# Patient Record
Sex: Female | Born: 1990 | Race: Black or African American | Hispanic: No | Smoking: Former smoker
Health system: Southern US, Community
[De-identification: ages and names within clinical notes are randomized; demographics above are authoritative.]

## PROBLEM LIST (undated history)

## (undated) ENCOUNTER — Inpatient Hospital Stay (HOSPITAL_COMMUNITY): Payer: Self-pay

## (undated) DIAGNOSIS — O24419 Gestational diabetes mellitus in pregnancy, unspecified control: Secondary | ICD-10-CM

## (undated) DIAGNOSIS — A749 Chlamydial infection, unspecified: Secondary | ICD-10-CM

## (undated) DIAGNOSIS — N12 Tubulo-interstitial nephritis, not specified as acute or chronic: Secondary | ICD-10-CM

## (undated) DIAGNOSIS — O139 Gestational [pregnancy-induced] hypertension without significant proteinuria, unspecified trimester: Secondary | ICD-10-CM

## (undated) DIAGNOSIS — K469 Unspecified abdominal hernia without obstruction or gangrene: Secondary | ICD-10-CM

## (undated) DIAGNOSIS — D649 Anemia, unspecified: Secondary | ICD-10-CM

## (undated) DIAGNOSIS — E119 Type 2 diabetes mellitus without complications: Secondary | ICD-10-CM

## (undated) DIAGNOSIS — IMO0002 Reserved for concepts with insufficient information to code with codable children: Secondary | ICD-10-CM

## (undated) DIAGNOSIS — K5090 Crohn's disease, unspecified, without complications: Secondary | ICD-10-CM

## (undated) HISTORY — PX: HERNIA REPAIR: SHX51

---
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
## 2011-03-04 ENCOUNTER — Inpatient Hospital Stay (HOSPITAL_COMMUNITY): Payer: Medicaid Other

## 2011-03-04 ENCOUNTER — Inpatient Hospital Stay (HOSPITAL_COMMUNITY)
Admission: AD | Admit: 2011-03-04 | Discharge: 2011-03-04 | Disposition: A | Discharge status: Home / Self Care | Payer: Medicaid Other | Source: Ambulatory Visit | Attending: Obstetrics & Gynecology | Admitting: Obstetrics & Gynecology

## 2011-03-04 DIAGNOSIS — O209 Hemorrhage in early pregnancy, unspecified: Secondary | ICD-10-CM | POA: Insufficient documentation

## 2011-03-04 DIAGNOSIS — O2390 Unspecified genitourinary tract infection in pregnancy, unspecified trimester: Secondary | ICD-10-CM | POA: Insufficient documentation

## 2011-03-04 DIAGNOSIS — N760 Acute vaginitis: Secondary | ICD-10-CM | POA: Insufficient documentation

## 2011-03-04 DIAGNOSIS — B9689 Other specified bacterial agents as the cause of diseases classified elsewhere: Secondary | ICD-10-CM | POA: Insufficient documentation

## 2011-03-04 DIAGNOSIS — A499 Bacterial infection, unspecified: Secondary | ICD-10-CM

## 2011-03-04 LAB — URINALYSIS, ROUTINE W REFLEX MICROSCOPIC
Glucose, UA: NEGATIVE mg/dL
Ketones, ur: NEGATIVE mg/dL
Protein, ur: NEGATIVE mg/dL
Urobilinogen, UA: 0.2 mg/dL (ref 0.0–1.0)

## 2011-03-04 LAB — WET PREP, GENITAL: Trich, Wet Prep: NONE SEEN

## 2011-03-04 LAB — CBC
HCT: 34 % — ABNORMAL LOW (ref 36.0–46.0)
MCHC: 33.5 g/dL (ref 30.0–36.0)
MCV: 85.2 fL (ref 78.0–100.0)
Platelets: 201 10*3/uL (ref 150–400)
RDW: 13.8 % (ref 11.5–15.5)

## 2011-03-04 LAB — HCG, QUANTITATIVE, PREGNANCY: hCG, Beta Chain, Quant, S: 62820 m[IU]/mL — ABNORMAL HIGH (ref <5)

## 2011-03-07 LAB — GC/CHLAMYDIA PROBE AMP, GENITAL: GC Probe Amp, Genital: POSITIVE — AB

## 2011-05-10 ENCOUNTER — Encounter (HOSPITAL_COMMUNITY): Payer: Self-pay

## 2011-05-10 ENCOUNTER — Inpatient Hospital Stay (HOSPITAL_COMMUNITY): Payer: Medicaid Other

## 2011-05-10 ENCOUNTER — Inpatient Hospital Stay (HOSPITAL_COMMUNITY)
Admission: AD | Admit: 2011-05-10 | Discharge: 2011-05-10 | Disposition: A | Discharge status: Home / Self Care | Payer: Medicaid Other | Source: Ambulatory Visit | Attending: Obstetrics | Admitting: Obstetrics

## 2011-05-10 DIAGNOSIS — A5901 Trichomonal vulvovaginitis: Secondary | ICD-10-CM

## 2011-05-10 DIAGNOSIS — R109 Unspecified abdominal pain: Secondary | ICD-10-CM | POA: Insufficient documentation

## 2011-05-10 DIAGNOSIS — N949 Unspecified condition associated with female genital organs and menstrual cycle: Secondary | ICD-10-CM | POA: Insufficient documentation

## 2011-05-10 DIAGNOSIS — N938 Other specified abnormal uterine and vaginal bleeding: Secondary | ICD-10-CM | POA: Insufficient documentation

## 2011-05-10 HISTORY — DX: Unspecified abdominal hernia without obstruction or gangrene: K46.9

## 2011-05-10 LAB — ABO/RH: ABO/RH(D): O POS

## 2011-05-10 LAB — URINALYSIS, ROUTINE W REFLEX MICROSCOPIC
Leukocytes, UA: NEGATIVE
Protein, ur: NEGATIVE mg/dL
Specific Gravity, Urine: 1.015 (ref 1.005–1.030)
Urobilinogen, UA: 1 mg/dL (ref 0.0–1.0)

## 2011-05-10 MED ORDER — METRONIDAZOLE 500 MG PO TABS: ORAL_TABLET | ORAL | Status: DC

## 2011-05-10 NOTE — Progress Notes (Signed)
Pt states abd cramping and spotting began 3 days ago, last intercourse 1.5 weeks ago. Denies pain at present, no abnormal vaginal d/c changes. 1st appt w/Dr. Gaynell Face 05/21/2011, did call MD office, told to come here for eval.

## 2011-05-10 NOTE — ED Provider Notes (Signed)
History     Chief Complaint  Patient presents with  . Vaginal Bleeding  . Abdominal Cramping   HPI  Past Medical History  Diagnosis Date  . Hernia     Past Surgical History  Procedure Date  . Hernia repair     Family History  Problem Relation Age of Onset  . Hypertension Maternal Grandmother     History  Substance Use Topics  . Smoking status: Current Everyday Smoker  . Smokeless tobacco: Not on file  . Alcohol Use: No    OB History    Grav Para Term Preterm Abortions TAB SAB Ect Mult Living   1             3 day hx light vag spotting. No antecedent intercourse or irritative vaginitis. No dysuria or hematuria. Cramps as mild like menstrual cramps in suprapubic region.  Has MD NOB 05/22/11.   Review of Systems  Genitourinary: Negative for frequency, flank pain, vaginal discharge, vaginal pain and dyspareunia.    Physical Exam  BP 110/57  Pulse 70  Temp(Src) 97.5 F (36.4 C) (Oral)  Resp 18  Ht 5\' 7"  (1.702 m)  Wt 60.839 kg (134 lb 2 oz)  BMI 21.01 kg/m2  Physical Exam  Constitutional: She appears well-developed and well-nourished. No distress.  Abdominal: Soft. There is no tenderness.       Fundal height 17 cm at umbilicus  Genitourinary: Vagina normal.       NEFG, Spec: no vag/cx lesions, scant brown blood at os. Cx: closed, post, sl short, PP high    ED Course  Procedures [place]  Placenta nl w/ no evidence abruption or SCH.  Cx 3.7 - pt reassured. Warnings for bleeding in pregnancy reviewed. To come here or office if abd pain or BRB occurs.  WP pos for trich - will tx with Flagyl  MDM Bleeding in early pregnancy possibly due to trich cervicitis.

## 2011-05-22 LAB — RUBELLA ANTIBODY, IGM: Rubella: IMMUNE

## 2011-05-22 LAB — HIV ANTIBODY (ROUTINE TESTING W REFLEX): HIV: NONREACTIVE

## 2011-06-05 DIAGNOSIS — N949 Unspecified condition associated with female genital organs and menstrual cycle: Secondary | ICD-10-CM

## 2011-06-20 ENCOUNTER — Inpatient Hospital Stay (HOSPITAL_COMMUNITY)
Admission: AD | Admit: 2011-06-20 | Discharge: 2011-06-21 | Disposition: A | Discharge status: Home / Self Care | Payer: Medicaid Other | Source: Ambulatory Visit | Attending: Obstetrics | Admitting: Obstetrics

## 2011-06-20 DIAGNOSIS — O99891 Other specified diseases and conditions complicating pregnancy: Secondary | ICD-10-CM | POA: Insufficient documentation

## 2011-06-20 DIAGNOSIS — R109 Unspecified abdominal pain: Secondary | ICD-10-CM | POA: Insufficient documentation

## 2011-06-20 DIAGNOSIS — N949 Unspecified condition associated with female genital organs and menstrual cycle: Secondary | ICD-10-CM

## 2011-06-20 DIAGNOSIS — R51 Headache: Secondary | ICD-10-CM | POA: Insufficient documentation

## 2011-06-21 ENCOUNTER — Encounter (HOSPITAL_COMMUNITY): Payer: Self-pay | Admitting: *Deleted

## 2011-06-21 LAB — URINE MICROSCOPIC-ADD ON

## 2011-06-21 LAB — URINALYSIS, ROUTINE W REFLEX MICROSCOPIC
Glucose, UA: NEGATIVE mg/dL
Specific Gravity, Urine: 1.015 (ref 1.005–1.030)
pH: 6.5 (ref 5.0–8.0)

## 2011-06-21 NOTE — Progress Notes (Signed)
Pt states she is having pain since about 2200.

## 2011-06-21 NOTE — ED Provider Notes (Signed)
History   Pt presents today c/o lower abd cramping that "comes and goes." She also c/o a HA but states that has resolved since coming to the MAU. She denies vag dc, bleeding, fever, or any other sx at this time.  Chief Complaint  Patient presents with  . Abdominal Cramping   HPI  OB History    Grav Para Term Preterm Abortions TAB SAB Ect Mult Living   1               Past Medical History  Diagnosis Date  . Hernia   . No pertinent past medical history     Past Surgical History  Procedure Date  . Hernia repair     Family History  Problem Relation Age of Onset  . Hypertension Maternal Grandmother     History  Substance Use Topics  . Smoking status: Current Everyday Smoker  . Smokeless tobacco: Former Neurosurgeon    Quit date: 06/10/2011  . Alcohol Use: No    Allergies: No Known Allergies  Prescriptions prior to admission  Medication Sig Dispense Refill  . prenatal vitamin w/FE, FA (PRENATAL 1 + 1) 27-1 MG TABS Take 1 tablet by mouth daily.        . metroNIDAZOLE (FLAGYL) 500 MG tablet Take 4 tabs all at once  4 tablet  0    Review of Systems  Constitutional: Negative for fever.  Cardiovascular: Negative for chest pain.  Gastrointestinal: Positive for abdominal pain. Negative for nausea, vomiting, diarrhea and constipation.  Genitourinary: Negative for dysuria, urgency, frequency and hematuria.  Neurological: Positive for headaches. Negative for dizziness.  Psychiatric/Behavioral: Negative for depression and suicidal ideas.   Physical Exam   Temperature 97.8 F (36.6 C), temperature source Oral.  Physical Exam  Constitutional: She is oriented to person, place, and time. She appears well-developed and well-nourished. No distress.  HENT:  Head: Normocephalic and atraumatic.  Eyes: EOM are normal. Pupils are equal, round, and reactive to light.  GI: Soft. She exhibits no distension. There is no tenderness. There is no rebound and no guarding.  Genitourinary: No  bleeding around the vagina. No vaginal discharge found.       Cervix Lg/closed. No adnexal masses. Pt is non-tender on exam.  Neurological: She is alert and oriented to person, place, and time.  Skin: Skin is warm and dry. She is not diaphoretic.  Psychiatric: She has a normal mood and affect. Her behavior is normal. Judgment and thought content normal.    MAU Course  Procedures  Results for orders placed during the hospital encounter of 06/20/11 (from the past 24 hour(s))  URINALYSIS, ROUTINE W REFLEX MICROSCOPIC     Status: Abnormal   Collection Time   06/21/11 12:05 AM      Component Value Range   Color, Urine YELLOW  YELLOW    Appearance CLEAR  CLEAR    Specific Gravity, Urine 1.015  1.005 - 1.030    pH 6.5  5.0 - 8.0    Glucose, UA NEGATIVE  NEGATIVE (mg/dL)   Hgb urine dipstick NEGATIVE  NEGATIVE    Bilirubin Urine NEGATIVE  NEGATIVE    Ketones, ur NEGATIVE  NEGATIVE (mg/dL)   Protein, ur NEGATIVE  NEGATIVE (mg/dL)   Urobilinogen, UA 1.0  0.0 - 1.0 (mg/dL)   Nitrite NEGATIVE  NEGATIVE    Leukocytes, UA TRACE (*) NEGATIVE   URINE MICROSCOPIC-ADD ON     Status: Abnormal   Collection Time   06/21/11 12:05 AM  Component Value Range   Squamous Epithelial / LPF FEW (*) RARE    WBC, UA 7-10  <3 (WBC/hpf)   RBC / HPF 3-6  <3 (RBC/hpf)   Bacteria, UA MANY (*) RARE      Assessment and Plan  Round Ligament Pain: discussed with pt at length. She has f/u scheduled. Discussed diet, activity, risks, and precautions.  Clinton Gallant. Alessandro Griep III, DrHSc, MPAS, PA-C  06/21/2011, 12:40 AM

## 2011-06-29 ENCOUNTER — Inpatient Hospital Stay (HOSPITAL_COMMUNITY)
Admission: AD | Admit: 2011-06-29 | Discharge: 2011-06-29 | Discharge status: Against Medical Advice | Payer: Medicaid Other | Source: Ambulatory Visit | Attending: Obstetrics | Admitting: Obstetrics

## 2011-06-29 NOTE — Progress Notes (Signed)
Pt states, " I've had breast soreness,tender and sharp pain in my nipples for two weeks."

## 2011-06-29 NOTE — ED Notes (Signed)
Called, not in lobby 

## 2011-06-29 NOTE — ED Notes (Signed)
Called not in lobby.

## 2011-08-10 ENCOUNTER — Encounter (HOSPITAL_COMMUNITY): Payer: Self-pay | Admitting: *Deleted

## 2011-09-24 LAB — CULTURE, BETA STREP (GROUP B ONLY): Organism ID, Bacteria: NEGATIVE

## 2011-10-25 ENCOUNTER — Inpatient Hospital Stay (HOSPITAL_COMMUNITY): Payer: Medicaid Other

## 2011-10-25 ENCOUNTER — Encounter (HOSPITAL_COMMUNITY): Payer: Self-pay | Admitting: Anesthesiology

## 2011-10-25 ENCOUNTER — Other Ambulatory Visit: Payer: Self-pay | Admitting: Obstetrics

## 2011-10-25 ENCOUNTER — Inpatient Hospital Stay (HOSPITAL_COMMUNITY)
Admission: AD | Admit: 2011-10-25 | Discharge: 2011-10-29 | DRG: 775 | Disposition: A | Discharge status: Home / Self Care | Payer: Medicaid Other | Source: Ambulatory Visit | Attending: Obstetrics | Admitting: Obstetrics

## 2011-10-25 ENCOUNTER — Encounter (HOSPITAL_COMMUNITY): Payer: Self-pay | Admitting: *Deleted

## 2011-10-25 ENCOUNTER — Inpatient Hospital Stay (HOSPITAL_COMMUNITY): Payer: Medicaid Other | Admitting: Anesthesiology

## 2011-10-25 DIAGNOSIS — O99892 Other specified diseases and conditions complicating childbirth: Secondary | ICD-10-CM | POA: Diagnosis present

## 2011-10-25 DIAGNOSIS — O364XX0 Maternal care for intrauterine death, not applicable or unspecified: Principal | ICD-10-CM | POA: Diagnosis present

## 2011-10-25 DIAGNOSIS — E1010 Type 1 diabetes mellitus with ketoacidosis without coma: Secondary | ICD-10-CM

## 2011-10-25 DIAGNOSIS — O24919 Unspecified diabetes mellitus in pregnancy, unspecified trimester: Secondary | ICD-10-CM

## 2011-10-25 DIAGNOSIS — Z22330 Carrier of Group B streptococcus: Secondary | ICD-10-CM

## 2011-10-25 DIAGNOSIS — R7309 Other abnormal glucose: Secondary | ICD-10-CM | POA: Diagnosis present

## 2011-10-25 LAB — CBC
HCT: 40.2 % (ref 36.0–46.0)
Hemoglobin: 13.8 g/dL (ref 12.0–15.0)
MCH: 29 pg (ref 26.0–34.0)
MCHC: 34.3 g/dL (ref 30.0–36.0)

## 2011-10-25 LAB — URINALYSIS, DIPSTICK ONLY
Bilirubin Urine: NEGATIVE
Glucose, UA: >1000 mg/dL — AB
Hgb urine dipstick: NEGATIVE
Specific Gravity, Urine: <1.005 — ABNORMAL LOW (ref 1.005–1.030)

## 2011-10-25 LAB — COMPREHENSIVE METABOLIC PANEL
ALT: 15 U/L (ref 0–35)
AST: 15 U/L (ref 0–37)
CO2: 17 mEq/L — ABNORMAL LOW (ref 19–32)
Chloride: 90 mEq/L — ABNORMAL LOW (ref 96–112)
Creatinine, Ser: 0.93 mg/dL (ref 0.50–1.10)
GFR calc Af Amer: >90 mL/min (ref >90)
GFR calc non Af Amer: 89 mL/min — ABNORMAL LOW (ref >90)
Glucose, Bld: 876 mg/dL (ref 70–99)
Total Bilirubin: 0.3 mg/dL (ref 0.3–1.2)

## 2011-10-25 LAB — BLOOD GAS, ARTERIAL
Drawn by: 29165
TCO2: 16.4 mmol/L (ref 0–100)
pCO2 arterial: 26.9 mmHg — ABNORMAL LOW (ref 35.0–45.0)
pH, Arterial: 7.381 (ref 7.350–7.400)

## 2011-10-25 LAB — BASIC METABOLIC PANEL
CO2: 20 mEq/L (ref 19–32)
GFR calc non Af Amer: 71 mL/min — ABNORMAL LOW (ref >90)
Glucose, Bld: 664 mg/dL (ref 70–99)
Potassium: 4.2 mEq/L (ref 3.5–5.1)
Sodium: 126 mEq/L — ABNORMAL LOW (ref 135–145)

## 2011-10-25 LAB — RPR: RPR: NONREACTIVE

## 2011-10-25 LAB — GLUCOSE, CAPILLARY
Glucose-Capillary: >600 mg/dL (ref 70–99)
Glucose-Capillary: >600 mg/dL (ref 70–99)
Glucose-Capillary: >600 mg/dL (ref 70–99)
Glucose-Capillary: 373 mg/dL — ABNORMAL HIGH (ref 70–99)
Glucose-Capillary: 246 mg/dL — ABNORMAL HIGH (ref 70–99)

## 2011-10-25 MED ORDER — DEXTROSE 50 % IV SOLN: 25.0000 mL | INTRAVENOUS | Status: DC | PRN

## 2011-10-25 MED ORDER — SODIUM CHLORIDE 0.9 % IV SOLN
Freq: Once | INTRAVENOUS | Status: DC
  Filled 2011-10-25: qty 1000

## 2011-10-25 MED ORDER — FLEET ENEMA 7-19 GM/118ML RE ENEM: 1.0000 | ENEMA | RECTAL | Status: DC | PRN

## 2011-10-25 MED ORDER — INSULIN REGULAR BOLUS VIA INFUSION: 0.0000 [IU] | Freq: Three times a day (TID) | INTRAVENOUS | Status: DC | PRN

## 2011-10-25 MED ORDER — PHENYLEPHRINE 40 MCG/ML (10ML) SYRINGE FOR IV PUSH (FOR BLOOD PRESSURE SUPPORT): 80.0000 ug | PREFILLED_SYRINGE | INTRAVENOUS | Status: DC | PRN

## 2011-10-25 MED ORDER — FENTANYL 2.5 MCG/ML BUPIVACAINE 1/10 % EPIDURAL INFUSION (WH - ANES)
14.0000 mL/h | INTRAMUSCULAR | Status: DC
  Administered 2011-10-25 (×2): 14 mL/h via EPIDURAL
  Filled 2011-10-25 (×2): qty 60

## 2011-10-25 MED ORDER — FERROUS SULFATE 325 (65 FE) MG PO TABS
325.0000 mg | ORAL_TABLET | Freq: Two times a day (BID) | ORAL | Status: DC
  Administered 2011-10-26 – 2011-10-29 (×7): 325 mg via ORAL
  Filled 2011-10-25 (×7): qty 1

## 2011-10-25 MED ORDER — WITCH HAZEL-GLYCERIN EX PADS
1.0000 "application " | MEDICATED_PAD | CUTANEOUS | Status: DC | PRN
  Administered 2011-10-29: 1 via TOPICAL

## 2011-10-25 MED ORDER — OXYTOCIN BOLUS FROM INFUSION
500.0000 mL | Freq: Once | INTRAVENOUS | Status: DC
  Filled 2011-10-25: qty 500

## 2011-10-25 MED ORDER — PHENYLEPHRINE 40 MCG/ML (10ML) SYRINGE FOR IV PUSH (FOR BLOOD PRESSURE SUPPORT)
80.0000 ug | PREFILLED_SYRINGE | INTRAVENOUS | Status: DC | PRN
  Filled 2011-10-25: qty 5

## 2011-10-25 MED ORDER — LACTATED RINGERS IV SOLN
INTRAVENOUS | Status: DC
  Administered 2011-10-25 (×2): via INTRAVENOUS

## 2011-10-25 MED ORDER — OXYTOCIN 20 UNITS IN LACTATED RINGERS INFUSION - SIMPLE
125.0000 mL/h | Freq: Once | INTRAVENOUS | Status: AC
  Administered 2011-10-25: 500 mL/h via INTRAVENOUS

## 2011-10-25 MED ORDER — IBUPROFEN 600 MG PO TABS: 600.0000 mg | ORAL_TABLET | Freq: Four times a day (QID) | ORAL | Status: DC | PRN

## 2011-10-25 MED ORDER — EPHEDRINE 5 MG/ML INJ: 10.0000 mg | INTRAVENOUS | Status: DC | PRN

## 2011-10-25 MED ORDER — OXYCODONE-ACETAMINOPHEN 5-325 MG PO TABS: 2.0000 | ORAL_TABLET | ORAL | Status: DC | PRN

## 2011-10-25 MED ORDER — SODIUM CHLORIDE 0.45 % IV SOLN
INTRAVENOUS | Status: DC
  Administered 2011-10-25: 18:00:00 via INTRAVENOUS

## 2011-10-25 MED ORDER — OXYCODONE-ACETAMINOPHEN 5-325 MG PO TABS: 1.0000 | ORAL_TABLET | ORAL | Status: DC | PRN

## 2011-10-25 MED ORDER — OXYTOCIN 20 UNITS IN LACTATED RINGERS INFUSION - SIMPLE
1.0000 m[IU]/min | INTRAVENOUS | Status: DC
  Administered 2011-10-25: 1 m[IU]/min via INTRAVENOUS
  Filled 2011-10-25: qty 1000

## 2011-10-25 MED ORDER — DIBUCAINE 1 % RE OINT
1.0000 "application " | TOPICAL_OINTMENT | RECTAL | Status: DC | PRN
  Administered 2011-10-29: 1 via RECTAL
  Filled 2011-10-25: qty 28

## 2011-10-25 MED ORDER — DIPHENHYDRAMINE HCL 50 MG/ML IJ SOLN: 12.5000 mg | INTRAMUSCULAR | Status: DC | PRN

## 2011-10-25 MED ORDER — DEXTROSE-NACL 5-0.45 % IV SOLN: INTRAVENOUS | Status: DC

## 2011-10-25 MED ORDER — DEXTROSE-NACL 5-0.45 % IV SOLN
INTRAVENOUS | Status: DC
  Administered 2011-10-25 – 2011-10-26 (×2): via INTRAVENOUS

## 2011-10-25 MED ORDER — ONDANSETRON HCL 4 MG/2ML IJ SOLN: 4.0000 mg | Freq: Four times a day (QID) | INTRAMUSCULAR | Status: DC | PRN

## 2011-10-25 MED ORDER — SODIUM CHLORIDE 0.9 % IV SOLN: INTRAVENOUS | Status: DC

## 2011-10-25 MED ORDER — ZOLPIDEM TARTRATE 5 MG PO TABS: 5.0000 mg | ORAL_TABLET | Freq: Every evening | ORAL | Status: DC | PRN

## 2011-10-25 MED ORDER — SENNOSIDES-DOCUSATE SODIUM 8.6-50 MG PO TABS
2.0000 | ORAL_TABLET | Freq: Every day | ORAL | Status: DC
  Administered 2011-10-26 – 2011-10-28 (×3): 2 via ORAL

## 2011-10-25 MED ORDER — ONDANSETRON HCL 4 MG PO TABS: 4.0000 mg | ORAL_TABLET | ORAL | Status: DC | PRN

## 2011-10-25 MED ORDER — POTASSIUM CHLORIDE 10 MEQ/100ML IV SOLN: 10.0000 meq | Freq: Once | INTRAVENOUS | Status: DC

## 2011-10-25 MED ORDER — ACETAMINOPHEN 325 MG PO TABS: 650.0000 mg | ORAL_TABLET | ORAL | Status: DC | PRN

## 2011-10-25 MED ORDER — LACTATED RINGERS IV SOLN: 500.0000 mL | INTRAVENOUS | Status: DC | PRN

## 2011-10-25 MED ORDER — SIMETHICONE 80 MG PO CHEW: 80.0000 mg | CHEWABLE_TABLET | ORAL | Status: DC | PRN

## 2011-10-25 MED ORDER — BENZOCAINE-MENTHOL 20-0.5 % EX AERO
1.0000 "application " | INHALATION_SPRAY | CUTANEOUS | Status: DC | PRN
  Administered 2011-10-27 – 2011-10-29 (×2): 1 via TOPICAL

## 2011-10-25 MED ORDER — PROMETHAZINE HCL 25 MG/ML IJ SOLN
12.5000 mg | INTRAMUSCULAR | Status: DC
Start: 2011-10-25 — End: 2011-10-25
  Administered 2011-10-25: 12.5 mg via INTRAVENOUS
  Filled 2011-10-25: qty 1

## 2011-10-25 MED ORDER — SODIUM CHLORIDE 0.9 % IV SOLN
INTRAVENOUS | Status: DC
  Administered 2011-10-25: 5.4 [IU]/h via INTRAVENOUS
  Filled 2011-10-25: qty 1

## 2011-10-25 MED ORDER — BUTORPHANOL TARTRATE 2 MG/ML IJ SOLN
1.0000 mg | INTRAMUSCULAR | Status: DC
  Administered 2011-10-25: 1 mg via INTRAVENOUS
  Filled 2011-10-25: qty 1

## 2011-10-25 MED ORDER — ONDANSETRON HCL 4 MG/2ML IJ SOLN: 4.0000 mg | INTRAMUSCULAR | Status: DC | PRN

## 2011-10-25 MED ORDER — DIPHENHYDRAMINE HCL 25 MG PO CAPS: 25.0000 mg | ORAL_CAPSULE | Freq: Four times a day (QID) | ORAL | Status: DC | PRN

## 2011-10-25 MED ORDER — TERBUTALINE SULFATE 1 MG/ML IJ SOLN: 0.2500 mg | Freq: Once | INTRAMUSCULAR | Status: DC | PRN

## 2011-10-25 MED ORDER — SODIUM CHLORIDE 0.9 % IV SOLN
INTRAVENOUS | Status: DC
  Administered 2011-10-25: 21:00:00 via INTRAVENOUS
  Filled 2011-10-25 (×2): qty 1000

## 2011-10-25 MED ORDER — IBUPROFEN 600 MG PO TABS
600.0000 mg | ORAL_TABLET | Freq: Four times a day (QID) | ORAL | Status: DC
  Administered 2011-10-26 – 2011-10-29 (×9): 600 mg via ORAL
  Filled 2011-10-25 (×10): qty 1

## 2011-10-25 MED ORDER — LIDOCAINE HCL (PF) 1 % IJ SOLN
30.0000 mL | INTRAMUSCULAR | Status: DC | PRN
  Filled 2011-10-25: qty 30

## 2011-10-25 MED ORDER — OXYTOCIN 10 UNIT/ML IJ SOLN
20.0000 [IU] | Freq: Once | INTRAVENOUS | Status: DC
  Filled 2011-10-25: qty 2

## 2011-10-25 MED ORDER — INSULIN REGULAR BOLUS VIA INFUSION: 0.0000 [IU] | Freq: Three times a day (TID) | INTRAVENOUS | Status: DC

## 2011-10-25 MED ORDER — INSULIN ASPART 100 UNIT/ML ~~LOC~~ SOLN: 0.0000 [IU] | SUBCUTANEOUS | Status: DC

## 2011-10-25 MED ORDER — LACTATED RINGERS IV SOLN: 500.0000 mL | Freq: Once | INTRAVENOUS | Status: DC

## 2011-10-25 MED ORDER — LIDOCAINE HCL 1.5 % IJ SOLN
INTRAMUSCULAR | Status: DC | PRN
  Administered 2011-10-25 (×2): 5 mL via EPIDURAL

## 2011-10-25 MED ORDER — INSULIN REGULAR HUMAN 100 UNIT/ML IJ SOLN: INTRAMUSCULAR | Status: DC | PRN

## 2011-10-25 MED ORDER — EPHEDRINE 5 MG/ML INJ
10.0000 mg | INTRAVENOUS | Status: DC | PRN
  Filled 2011-10-25: qty 4

## 2011-10-25 MED ORDER — TETANUS-DIPHTH-ACELL PERTUSSIS 5-2.5-18.5 LF-MCG/0.5 IM SUSP
0.5000 mL | Freq: Once | INTRAMUSCULAR | Status: DC
  Filled 2011-10-25: qty 0.5

## 2011-10-25 MED ORDER — CITRIC ACID-SODIUM CITRATE 334-500 MG/5ML PO SOLN: 30.0000 mL | ORAL | Status: DC | PRN

## 2011-10-25 MED ORDER — PRENATAL MULTIVITAMIN CH
1.0000 | ORAL_TABLET | Freq: Every day | ORAL | Status: DC
  Administered 2011-10-27 – 2011-10-29 (×3): 1 via ORAL
  Filled 2011-10-25 (×3): qty 1

## 2011-10-25 MED ORDER — SODIUM CHLORIDE 0.9 % IV SOLN
INTRAVENOUS | Status: DC
  Filled 2011-10-25 (×2): qty 1

## 2011-10-25 MED ORDER — LANOLIN HYDROUS EX OINT: TOPICAL_OINTMENT | CUTANEOUS | Status: DC | PRN

## 2011-10-25 MED ORDER — SODIUM CHLORIDE 0.9 % IV SOLN
INTRAVENOUS | Status: DC
  Administered 2011-10-25: 20:00:00 via INTRAVENOUS

## 2011-10-25 NOTE — Anesthesia Procedure Notes (Signed)
Epidural Patient location during procedure: OB Start time: 10/25/2011 3:05 PM  Staffing Anesthesiologist: Brayton Caves R Performed by: anesthesiologist   Preanesthetic Checklist Completed: patient identified, site marked, surgical consent, pre-op evaluation, timeout performed, IV checked, risks and benefits discussed and monitors and equipment checked  Epidural Patient position: sitting Prep: site prepped and draped and DuraPrep Patient monitoring: continuous pulse ox and blood pressure Approach: midline Injection technique: LOR air and LOR saline  Needle:  Needle type: Tuohy  Needle gauge: 17 G Needle length: 9 cm Needle insertion depth: 5 cm cm Catheter type: closed end flexible Catheter size: 19 Gauge Catheter at skin depth: 10 cm Test dose: negative  Assessment Events: blood not aspirated, injection not painful, no injection resistance, negative IV test and no paresthesia  Additional Notes Patient identified.  Risk benefits discussed including failed block, incomplete pain control, headache, nerve damage, paralysis, blood pressure changes, nausea, vomiting, reactions to medication both toxic or allergic, and postpartum back pain.  Patient expressed understanding and wished to proceed.  All questions were answered.  Sterile technique used throughout procedure and epidural site dressed with sterile barrier dressing. No paresthesia or other complications noted.The patient did not experience any signs of intravascular injection such as tinnitus or metallic taste in mouth nor signs of intrathecal spread such as rapid motor block. Please see nursing notes for vital signs.

## 2011-10-25 NOTE — ED Provider Notes (Signed)
History     Chief Complaint  Patient presents with  . Contractions   HPI  Pt here with report of contractions that started this morning.  Denies vaginal bleeding or leaking of fluid.  Reports fetal movement earlier this morning.    Past Medical History  Diagnosis Date  . Hernia   . No pertinent past medical history     Past Surgical History  Procedure Date  . Hernia repair     Family History  Problem Relation Age of Onset  . Hypertension Maternal Grandmother     History  Substance Use Topics  . Smoking status: Current Everyday Smoker -- 0.2 packs/day  . Smokeless tobacco: Former Neurosurgeon    Quit date: 06/10/2011  . Alcohol Use: No    Allergies: No Known Allergies  Prescriptions prior to admission  Medication Sig Dispense Refill  . Prenatal Vit-Fe Fumarate-FA (PRENATAL MULTIVITAMIN) TABS Take 1 tablet by mouth daily.          Review of Systems  Gastrointestinal: Positive for abdominal pain ( contractions).  All other systems reviewed and are negative.   Physical Exam   Blood pressure 125/76, pulse 82, temperature 96.9 F (36.1 C), temperature source Oral, resp. rate 18, height 5\' 4"  (1.626 m), weight 74.753 kg (164 lb 12.8 oz), SpO2 98.00%.  Physical Exam  Constitutional: She is oriented to person, place, and time. She appears well-developed and well-nourished.  HENT:  Head: Normocephalic.  Neck: Normal range of motion. Neck supple.  Cardiovascular: Normal rate, regular rhythm and normal heart sounds.   Respiratory: Effort normal and breath sounds normal.  Genitourinary: No bleeding around the vagina. Vaginal discharge (mucusy) found.       Cervix - 2/75/0  Neurological: She is alert and oriented to person, place, and time.  Skin: Skin is warm and dry.  FHR - none  MAU Course  Procedures  Bedside US - no fetal heartrate  Assessment and Plan  Term Intrauterine Fetal Demise  Plan: Admit to [employer] Labor  orders   [facility] 10/25/2011, 11:43 AM

## 2011-10-25 NOTE — Anesthesia Preprocedure Evaluation (Addendum)
Anesthesia Evaluation  Patient identified by MRN, date of birth, ID band Patient awake    Reviewed: Allergy & Precautions, H&P , Patient's Chart, lab work & pertinent test results  Airway Mallampati: II TM Distance: >3 FB Neck ROM: full    Dental No notable dental hx.    Pulmonary neg pulmonary ROS,  clear to auscultation  Pulmonary exam normal       Cardiovascular neg cardio ROS regular Normal    Neuro/Psych Negative Neurological ROS  Negative Psych ROS   GI/Hepatic negative GI ROS, Neg liver ROS,   Endo/Other  Negative Endocrine ROSDiabetes mellitus-, Poorly Controlled  Renal/GU negative Renal ROS     Musculoskeletal   Abdominal   Peds  Hematology negative hematology ROS (+)   Anesthesia Other Findings IUFD  Reproductive/Obstetrics (+) Pregnancy                          Anesthesia Physical Anesthesia Plan  ASA: IV  Anesthesia Plan: Epidural   Post-op Pain Management:    Induction:   Airway Management Planned:   Additional Equipment:   Intra-op Plan:   Post-operative Plan:   Informed Consent: I have reviewed the patients History and Physical, chart, labs and discussed the procedure including the risks, benefits and alternatives for the proposed anesthesia with the patient or authorized representative who has indicated his/her understanding and acceptance.     Plan Discussed with:   Anesthesia Plan Comments:       question glucose results >800.  Would recommend checking urine ketones and glucose.  ASA status changed based on blood glucose level. Anesthesia Quick Evaluation

## 2011-10-25 NOTE — Progress Notes (Signed)
Patient to MAU with complaints of contractions every 2-4 minutes. Denies any bleeding or leaking and did report fetal movement this morning. Doppler in triage unable to pick up fetal heart bones and patient taken directly to room 5 in MAU.

## 2011-10-25 NOTE — Plan of Care (Signed)
Unable to hear fetal heart rate in triage. Patient taken directly to room 5 in MAU and called for stat bedside ultrasound. Bernita Buffy, CNM in with small unit ultrasound and no fetal heart beat seen.

## 2011-10-25 NOTE — Progress Notes (Signed)
Patient ID: Sandra Foster, female   DOB: Aug 03, 1991, 20 y.o.   MRN: [id number] Patient had a diabetic screen on 912 at 25 weeks which is 162 she had a three-hour diabetic screen on 918 which was totally normal fasting blood sugar 85 1  hour 145 two-hour 118 three-hour 124 this was done at 26 weeks she had 8 office visits and her urines were all negative for glucose this p.m. Her glucose was 800 and a repeat was 811 she was started on the Glucomander and this p.m. Critical care was consulted in talking with the patient she felt fine until about 2 weeks ago then she had another polyuria and polydipsia and felt lethargic prior to that time she felt fine at this point she is now fully dilated +1 station and after delivery with the transferred to the ICU for telemetry and to be seen by critical care

## 2011-10-25 NOTE — H&P (Signed)
This is Dr. Francoise Ceo dictating the history and physical on  Sandra Foster she's a 20 year old gravida 1 EDC 1229 12th negative GBS oh positive no allergies patient states that she's had good movement of the baby and last  movement was when she came to the hospital contracting this morning in triage shows no fetal heart an ultrasound revealed there was no fetal heart on examination the cervix is 2 cm 85% with the vertex at a 0 station amniotomy was performed the fluid was golden color Past medical history negative Past surgical history negative Social history negative System review negative Physical exam Well-developed female in in early labor Breasts negative Lungs clear Heart regular rhythm no murmurs no gallops Abdomen term size with no for the heart Pelvic as described above Extremities negative admitting diagnoses IUFD at 39 weeks and 5 days

## 2011-10-25 NOTE — Progress Notes (Signed)
1600 Lab called with a critical value of blood glucose of 876.  Results reported to dr Gaynell Face at [phone].  Dr Gaynell Face ordered a repeat blood glucose.

## 2011-10-25 NOTE — Consult Note (Signed)
Name: Sandra Foster MRN: [id number] DOB: [birth year]    LOS: 0  PCCM CONSULTATION NOTE  History of Present Illness: 20 yo AAF who presented to [facility] in labor and triage showed no fetal heart rate.  She was found to be hyperglycemic with BG exceeding 800.  Retrospectively she reports polyuria, polydipsia and lethargy over last two weeks.  Three hour diabetic screen at 26 weeks was normal.  Lines / Drains: 12/27  Foley  Cultures: None  Antibiotics: None  Tests / Events: 12/27  Presented in labor at term pregnancy with no fetal heart tones >>> fetal demise.  Hyperglycemia and acidotic.   Past Medical History  Diagnosis Date  . Hernia   . No pertinent past medical history    Past Surgical History  Procedure Date  . Hernia repair    Prior to Admission medications   Not on File   Allergies No Known Allergies  Family History Family History  Problem Relation Age of Onset  . Hypertension Maternal Grandmother    Social History  reports that she has been smoking.  She quit smokeless tobacco use about 4 months ago. She reports that she does not drink alcohol or use illicit drugs.  Review Of Systems  11 points review of systems is negative with an exception of listed in HPI.  Vital Signs: Temp:  [96.9 F (36.1 C)-98.7 F (37.1 C)] 98.7 F (37.1 C) (12/27 2201) Pulse Rate:  [72-127] 96  (12/27 2216) Resp:  [18-20] 18  (12/27 2201) BP: (97-145)/(46-89) 110/58 mmHg (12/27 2216) SpO2:  [98 %] 98 % (12/27 1049) Weight:  [74.753 kg (164 lb 12.8 oz)] 164 lb 12.8 oz (74.753 kg) (12/27 1049)   Physical Examination: General:  Anxious, no acute distress Neuro:  Awake, alert, oriented   HEENT:  PERRL Neck:  Supple, no JVD   Cardiovascular:  RRR, no M/R/G Lungs:  Clear to auscultation bilaterally Abdomen:  Soft, nontender Musculoskeletal:  No edema Skin:  Intact  Ventilator settings:   Labs and Imaging:  Reviewed.  Please refer to the Assessment and Plan section for relevant  results.  Assessment and Plan:  Status post vaginal delivery.  Fetal demise. -->  Per OB  New onset diabetes, likely precipitated by pregnancy.  Diabetic ketoacidosis.  Lab 10/25/11 2214 10/25/11 2109 10/25/11 2005 10/25/11 1901 10/25/11 1743  GLUCAP 373* 496* >600* >600* >600*   -->  IVF NS, change to D5 1/2 NS at BG 250 -->  BMP q4h, call eLink with results -->  Urine ketones -->  DKA protocol  Best practices / Disposition -->  ICU status under OB -->  PCCM consulting -->  full code -->  SCDs for DVT Px -->  GI Px is not indicated -->  NPO  Orlean Bradford, M.D. Pulmonary and Critical Care Medicine [facility] Cell: [phone] Pager: [phone]  10/25/2011, 10:26 PM

## 2011-10-25 NOTE — Progress Notes (Signed)
1700 Dr Gaynell Face notified by Lujean Rave, rn of critical value of blood glucose of 811.  Orders received for glucose stabilizer.

## 2011-10-26 ENCOUNTER — Encounter (HOSPITAL_COMMUNITY): Payer: Self-pay | Admitting: *Deleted

## 2011-10-26 ENCOUNTER — Other Ambulatory Visit: Payer: Self-pay | Admitting: Internal Medicine

## 2011-10-26 LAB — BASIC METABOLIC PANEL
BUN: 9 mg/dL (ref 6–23)
BUN: 7 mg/dL (ref 6–23)
BUN: 6 mg/dL (ref 6–23)
BUN: 6 mg/dL (ref 6–23)
BUN: 6 mg/dL (ref 6–23)
BUN: 7 mg/dL (ref 6–23)
CO2: 18 mEq/L — ABNORMAL LOW (ref 19–32)
CO2: 18 mEq/L — ABNORMAL LOW (ref 19–32)
CO2: 19 mEq/L (ref 19–32)
CO2: 18 mEq/L — ABNORMAL LOW (ref 19–32)
Calcium: 9.5 mg/dL (ref 8.4–10.5)
Calcium: 9.2 mg/dL (ref 8.4–10.5)
Calcium: 8.5 mg/dL (ref 8.4–10.5)
Chloride: 102 mEq/L (ref 96–112)
Chloride: 107 mEq/L (ref 96–112)
Creatinine, Ser: 0.8 mg/dL (ref 0.50–1.10)
Creatinine, Ser: 0.84 mg/dL (ref 0.50–1.10)
Creatinine, Ser: 0.81 mg/dL (ref 0.50–1.10)
Creatinine, Ser: 0.81 mg/dL (ref 0.50–1.10)
Creatinine, Ser: 0.79 mg/dL (ref 0.50–1.10)
GFR calc Af Amer: >90 mL/min (ref >90)
GFR calc non Af Amer: >90 mL/min (ref >90)
GFR calc non Af Amer: >90 mL/min (ref >90)
GFR calc non Af Amer: >90 mL/min (ref >90)
Glucose, Bld: 252 mg/dL — ABNORMAL HIGH (ref 70–99)
Glucose, Bld: 136 mg/dL — ABNORMAL HIGH (ref 70–99)
Glucose, Bld: 185 mg/dL — ABNORMAL HIGH (ref 70–99)
Glucose, Bld: 202 mg/dL — ABNORMAL HIGH (ref 70–99)
Glucose, Bld: 348 mg/dL — ABNORMAL HIGH (ref 70–99)
Potassium: 3.4 mEq/L — ABNORMAL LOW (ref 3.5–5.1)
Sodium: 133 mEq/L — ABNORMAL LOW (ref 135–145)

## 2011-10-26 LAB — GLUCOSE, CAPILLARY
Glucose-Capillary: 137 mg/dL — ABNORMAL HIGH (ref 70–99)
Glucose-Capillary: 149 mg/dL — ABNORMAL HIGH (ref 70–99)
Glucose-Capillary: 171 mg/dL — ABNORMAL HIGH (ref 70–99)
Glucose-Capillary: 197 mg/dL — ABNORMAL HIGH (ref 70–99)
Glucose-Capillary: 131 mg/dL — ABNORMAL HIGH (ref 70–99)

## 2011-10-26 LAB — CBC
HCT: 31.2 % — ABNORMAL LOW (ref 36.0–46.0)
MCHC: 34.9 g/dL (ref 30.0–36.0)
RDW: 14.5 % (ref 11.5–15.5)

## 2011-10-26 LAB — HEMOGLOBIN A1C: Hgb A1c MFr Bld: 8.5 % — ABNORMAL HIGH (ref <5.7)

## 2011-10-26 LAB — KETONES, URINE: Ketones, ur: NEGATIVE mg/dL

## 2011-10-26 MED ORDER — INSULIN ASPART 100 UNIT/ML ~~LOC~~ SOLN
0.0000 [IU] | Freq: Three times a day (TID) | SUBCUTANEOUS | Status: DC
  Administered 2011-10-27: 3 [IU] via SUBCUTANEOUS
  Administered 2011-10-27: 8 [IU] via SUBCUTANEOUS
  Filled 2011-10-26: qty 3

## 2011-10-26 MED ORDER — POTASSIUM CHLORIDE 10 MEQ/100ML IV SOLN
10.0000 meq | INTRAVENOUS | Status: AC
  Administered 2011-10-26 (×6): 10 meq via INTRAVENOUS
  Filled 2011-10-26 (×6): qty 100

## 2011-10-26 MED ORDER — INSULIN ASPART 100 UNIT/ML ~~LOC~~ SOLN
0.0000 [IU] | SUBCUTANEOUS | Status: DC
  Administered 2011-10-26: 2 [IU] via SUBCUTANEOUS
  Administered 2011-10-26 (×2): 3 [IU] via SUBCUTANEOUS
  Filled 2011-10-26: qty 3

## 2011-10-26 MED ORDER — SODIUM CHLORIDE 0.45 % IV SOLN
INTRAVENOUS | Status: DC
  Administered 2011-10-26 – 2011-10-27 (×2): via INTRAVENOUS

## 2011-10-26 MED ORDER — SODIUM CHLORIDE 0.9 % IV SOLN
INTRAVENOUS | Status: DC
  Administered 2011-10-26: 7.3 [IU]/h via INTRAVENOUS
  Filled 2011-10-26: qty 1

## 2011-10-26 MED ORDER — LACTATED RINGERS IV SOLN: INTRAVENOUS | Status: DC

## 2011-10-26 NOTE — Progress Notes (Signed)
Glucometer failing to connect to server.  1600 CBG = 131

## 2011-10-26 NOTE — Progress Notes (Signed)
eLink Physician-Brief Progress Note Patient Name: Sandra Foster DOB: [birth year] MRN: [id number]  Date of Service  10/26/2011   HPI/Events of Note  hypokalemia   eICU Interventions  Potassium replaced   Intervention Category Intermediate Interventions: Electrolyte abnormality - evaluation and management  DETERDING,ELIZABETH 10/26/2011, 1:24 AM

## 2011-10-26 NOTE — Progress Notes (Signed)
Dr. Chestine Spore in to see pt. And orders received for diet, d/c foley, change IVF to 0.45%NS and CBG's to ac, hs and 0230

## 2011-10-26 NOTE — Progress Notes (Signed)
Dr. Chestine Spore called at 22:00 with pt's CBG result of 423.  Orders given to start insulin gtt along with glucostabilizer.  Pt instructed not to eat or drink anything except water per MD request.

## 2011-10-26 NOTE — Progress Notes (Signed)
Patient ID: Sandra Foster, female   DOB: [birth year], 20 y.o.   MRN: [id number] Postpartum day one Vital signs normal Fundus firm Lochia moderate Legs negative This a.m. Her potassium 3.5 and she is now on her fifth run of potassium her glucose was 145 at 6 AM today Patient feels fine and she will be seen by the endocrinologist. Dr Fraser Din clarkt today

## 2011-10-26 NOTE — Anesthesia Postprocedure Evaluation (Signed)
  Anesthesia Post-op Note  Patient: Sandra Foster  Procedure(s) Performed: * No procedures listed *  Patient Location: Mother/Baby  Anesthesia Type: Epidural  Level of Consciousness: awake, alert  and oriented  Airway and Oxygen Therapy: Patient Spontanous Breathing  Post-op Pain: none  Post-op Assessment: Post-op Vital signs reviewed, Patient's Cardiovascular Status Stable, No headache, No backache, No residual numbness and No residual motor weakness  Post-op Vital Signs: Reviewed and stable  Complications: No apparent anesthesia complications

## 2011-10-26 NOTE — Progress Notes (Signed)
eLink Physician-Brief Progress Note Patient Name: Sandra Foster DOB: Mar 21, 1991 MRN: [id number]  Date of Service  10/26/2011   HPI/Events of Note   Hyperglycemia has improved with 3 consecutive blood sugars less than 150.  eICU Interventions  Patient to remain NPO until rounds will place on q4 hour SSI.  If blood sugar continues to climb will change to NS infusion   Intervention Category Intermediate Interventions: Hyperglycemia - evaluation and treatment  Alphonsus Doyel 10/26/2011, 6:16 AM

## 2011-10-26 NOTE — Consult Note (Signed)
Sandra Foster is an 20 y.o woman admitted with a three week history of increasing polyuria, polydipsia at term in her unremarkable gestation.  She was noted to have had a "normal" screening 3 hour GTT ant 26 weeks,but presents to the ER with a blood glucose of 817 mg%.  In retrospect she relates that she'd experienced increasing thirst  For three weeks and often  Consumed volumes of sugar water to quench her thirst. Over the past several days she became increasingly lethargic, weak and was  brought to the hospital.  Although her personal history is negative for diabetes she has a strong family history.             BP  110/72  PR 88  RR 20   T 97.8 WD, WN  No acute distress. Skin   Warm and dry. No vitiligo or rash HEENT:  No pallor, sclera- anicteric  No stare, lid lag or proptosis Neck:  No thyroidomegaly Chest:  No splinting,  Tenderness and lungs are clear Cor:  Precordium is adynamic. No gallops or rubs. Abd: No distention or tenderness Extremities: No clubbing or edema.         Lab CO-2 17 Glucose    817  Impression:  Stillbirth secondary to  hyperosmolar, hyperglycemic coma  and associated  metabolic derangements. Patient is without clear cut evidence of  Type I diabetes.  Will check C peptide levels in AM.  Continuous  Insulin discontinued but we'll continue fractional schedule of lispro  insulinwith meals.Activity and diet advanced.

## 2011-10-27 ENCOUNTER — Other Ambulatory Visit: Payer: Self-pay | Admitting: Internal Medicine

## 2011-10-27 LAB — BASIC METABOLIC PANEL
BUN: 9 mg/dL (ref 6–23)
BUN: 8 mg/dL (ref 6–23)
Calcium: 9.1 mg/dL (ref 8.4–10.5)
Calcium: 9 mg/dL (ref 8.4–10.5)
Chloride: 111 mEq/L (ref 96–112)
Creatinine, Ser: 0.73 mg/dL (ref 0.50–1.10)
GFR calc Af Amer: >90 mL/min (ref >90)
GFR calc non Af Amer: >90 mL/min (ref >90)
GFR calc non Af Amer: >90 mL/min (ref >90)
Glucose, Bld: 156 mg/dL — ABNORMAL HIGH (ref 70–99)

## 2011-10-27 LAB — GLUCOSE, CAPILLARY
Glucose-Capillary: 105 mg/dL — ABNORMAL HIGH (ref 70–99)
Glucose-Capillary: 98 mg/dL (ref 70–99)
Glucose-Capillary: 100 mg/dL — ABNORMAL HIGH (ref 70–99)
Glucose-Capillary: 118 mg/dL — ABNORMAL HIGH (ref 70–99)
Glucose-Capillary: 138 mg/dL — ABNORMAL HIGH (ref 70–99)
Glucose-Capillary: 258 mg/dL — ABNORMAL HIGH (ref 70–99)
Glucose-Capillary: 127 mg/dL — ABNORMAL HIGH (ref 70–99)

## 2011-10-27 LAB — HEMOGLOBIN A1C
Hgb A1c MFr Bld: 8.2 % — ABNORMAL HIGH (ref <5.7)
Mean Plasma Glucose: 189 mg/dL — ABNORMAL HIGH (ref <117)

## 2011-10-27 MED ORDER — INSULIN GLARGINE 100 UNIT/ML ~~LOC~~ SOLN
12.0000 [IU] | Freq: Two times a day (BID) | SUBCUTANEOUS | Status: DC
  Administered 2011-10-27: 12 [IU] via SUBCUTANEOUS
  Filled 2011-10-27: qty 3

## 2011-10-27 MED ORDER — BENZOCAINE-MENTHOL 20-0.5 % EX AERO
INHALATION_SPRAY | CUTANEOUS | Status: AC
  Filled 2011-10-27: qty 56

## 2011-10-27 MED ORDER — SODIUM CHLORIDE 0.9 % IJ SOLN
3.0000 mL | Freq: Two times a day (BID) | INTRAMUSCULAR | Status: DC
  Administered 2011-10-27 – 2011-10-28 (×4): 3 mL via INTRAVENOUS

## 2011-10-27 NOTE — Progress Notes (Signed)
Patient ID: Sandra Foster, female   DOB: [birth year], 20 y.o.   MRN: [id number] Postpartum day 2 Vital signs normal Fundus firm Lochia moderate Legs negative Patient being worked up by Dr. Floyce Stakes she is on sliding-scale insulin but then was changed to [employer] last night because her blood sugar had gone up about 400 workup in progress

## 2011-10-27 NOTE — Progress Notes (Signed)
Patient seen today and has been doing well. No complaints acknowledged.Blood sugars spiked to a high 418 after supper and glucose stabilizer restarted.  Maintenance insulin dose about 1 unit per hour and she was started on 12 units of  lantus  Insulin daily along with coverage.  BP 102/72  PR 74  RR  20  T97.8  Exam unchanged.  Plan:  discontinue IV and convert to saline lock           Advance activity           Nutritional management and diabetic teaching           HB-A1C,  FRUCTOSAMINE   levels

## 2011-10-27 NOTE — Progress Notes (Signed)
Noted consult for diet education. Given instability of serum glucose levels, it is not currently an optimal time for education to occur. Will follow clinical course and complete consult for diet education prior to discharge.  Please page if to be discharged over the weekend  Barbette Reichmann RD Pager [phone] (text pager)

## 2011-10-27 NOTE — Progress Notes (Signed)
10/27/11 1155 Reviewed pt hx, orders, & CBG's w/Dr Petra Kuba - MD ordered use of lunch time meal coverage for 2hr PP CBG of 370. 1214 CBG repeated prior to giving insulin  d/t being >65min since last CBG  - CBG now 258

## 2011-10-28 LAB — CBC
Hemoglobin: 11.1 g/dL — ABNORMAL LOW (ref 12.0–15.0)
MCH: 29.1 pg (ref 26.0–34.0)
MCHC: 34.3 g/dL (ref 30.0–36.0)
MCV: 85 fL (ref 78.0–100.0)
RBC: 3.81 MIL/uL — ABNORMAL LOW (ref 3.87–5.11)

## 2011-10-28 LAB — DIFFERENTIAL
Basophils Relative: 0 % (ref 0–1)
Eosinophils Absolute: 0.1 10*3/uL (ref 0.0–0.7)
Eosinophils Relative: 1 % (ref 0–5)
Lymphs Abs: 2.7 10*3/uL (ref 0.7–4.0)
Monocytes Absolute: 0.9 10*3/uL (ref 0.1–1.0)
Monocytes Relative: 9 % (ref 3–12)
Neutrophils Relative %: 64 % (ref 43–77)

## 2011-10-28 LAB — BASIC METABOLIC PANEL
BUN: 8 mg/dL (ref 6–23)
CO2: 20 mEq/L (ref 19–32)
GFR calc non Af Amer: >90 mL/min (ref >90)
Glucose, Bld: 102 mg/dL — ABNORMAL HIGH (ref 70–99)
Potassium: 3.3 mEq/L — ABNORMAL LOW (ref 3.5–5.1)

## 2011-10-28 LAB — GLUCOSE, CAPILLARY
Glucose-Capillary: 85 mg/dL (ref 70–99)
Glucose-Capillary: 104 mg/dL — ABNORMAL HIGH (ref 70–99)

## 2011-10-28 NOTE — Plan of Care (Signed)
Problem: Discharge Progression Outcomes Goal: Complications resolved/controlled Outcome: Completed/Met Date Met:  10/28/11 Patient has been taken off of Glucommander and has been down graded to [facility] and HS accu checks.Her recent CBG's have not gone under 100 or above 200 and she is not on any long term hyperglycemic  medications

## 2011-10-28 NOTE — Progress Notes (Signed)
Patient ID: Sandra Foster, female   DOB: [birth year], 20 y.o.   MRN: [id number] #3 Vital signs normal Fundus firm Lochia moderate Legs negative Workup in progress in her glucose was 145 last night an 84 this a.m. She'll be transferred from the ICU this a.m.

## 2011-10-28 NOTE — Plan of Care (Signed)
Problem: Consults Goal: Nutrition Consult-if indicated Outcome: Progressing Dietician notified 12/29 - will see 12/31 - educational material printed & given to pt as instructed by dietician

## 2011-10-28 NOTE — Progress Notes (Signed)
10/28/11 1610 SBar report given to Eliezer Bottom, RN for pt transfer to [facility] rm #[id number].

## 2011-10-28 NOTE — Plan of Care (Signed)
Problem: Discharge Progression Outcomes Goal: Tolerating diet Outcome: Completed/Met Date Met:  10/28/11 Patient is a Moderate Carbohydrate diet and tolerates this well. Goal: Pain controlled with appropriate interventions Outcome: Completed/Met Date Met:  10/28/11 Patient has only been taking her around the clock Motrin with good relief.

## 2011-10-28 NOTE — Progress Notes (Signed)
10/28/11 0808 MD saw pt at 0700 & wrote note that pt was to transfer out of AICU this AM, but no orders entered. Called MD - he stated that he doesn't write orders to transfer out of AICU & he is at home now - he wants all orders continued & pt transferred to the floor. Per Dr Gaynell Face care to managed by Dr Chestine Spore.

## 2011-10-28 NOTE — Plan of Care (Signed)
Problem: Discharge Progression Outcomes Goal: Barriers To Progression Addressed/Resolved Outcome: Completed/Met Date Met:  10/28/11 Final arrangements have been made by the family for her sons burial service

## 2011-10-29 LAB — GLUCOSE, CAPILLARY: Glucose-Capillary: 152 mg/dL — ABNORMAL HIGH (ref 70–99)

## 2011-10-29 MED ORDER — MEDROXYPROGESTERONE ACETATE 150 MG/ML IM SUSP: 150.0000 mg | INTRAMUSCULAR | Status: DC

## 2011-10-29 MED ORDER — BENZOCAINE-MENTHOL 20-0.5 % EX AERO
INHALATION_SPRAY | CUTANEOUS | Status: AC
  Filled 2011-10-29: qty 56

## 2011-10-29 MED ORDER — MEDROXYPROGESTERONE ACETATE 150 MG/ML IM SUSP: 150.0000 mg | Freq: Once | INTRAMUSCULAR | Status: DC

## 2011-10-29 MED ORDER — MEDROXYPROGESTERONE ACETATE 150 MG/ML IM SUSP
150.0000 mg | Freq: Once | INTRAMUSCULAR | Status: AC
  Administered 2011-10-29: 150 mg via INTRAMUSCULAR
  Filled 2011-10-29: qty 1

## 2011-10-29 NOTE — Progress Notes (Signed)
Spiritual Care - Visited with patient as she was preparing for discharge.  She reports that she is fine - did not express grief - states she is a strong person and has much support around her.  She proudly showed me pictures of her baby.  Arrangements have been made for service and cremation.  Since she did not recall receiving a grief packet, I gave her one and reviewed support resources available and encouraged her to use them as needed.  Explained that sometimes people have different emotional experiences in the days to come and specifically referred to "When Hello Means Goodbye."  C. Ronni Rumble

## 2011-10-29 NOTE — Progress Notes (Signed)
  Subjective:    Patient ID: Sandra Foster, female    DOB: 08-24-1991, 20 y.o.   MRN: [id number]  HPI    Review of Systems     Objective:   Physical Exam        Assessment & Plan:

## 2011-10-29 NOTE — Discharge Summary (Signed)
Obstetric Discharge Summary Reason for Admission: onset of labor Prenatal Procedures: none Intrapartum Procedures: spontaneous vaginal delivery Postpartum Procedures: none Complications-Operative and Postpartum: none Hemoglobin  Date Value Range Status  10/28/2011 11.1* 12.0-15.0 (g/dL) Final     HCT  Date Value Range Status  10/28/2011 32.4* 36.0-46.0 (%) Final    Discharge Diagnoses: Term Pregnancy-delivered  Discharge Information: Date: 10/29/2011 Activity: pelvic rest Diet: routine Medications: Percocet Condition: stable Instructions: refer to practice specific booklet Discharge to: home Follow-up Information    Follow up with Sandria Mcenroe A, MD. Call in 6 weeks.   Contact information:   [address] [place] [address] [phone]          Newborn Data: Live born female  Birth Weight: 8 lb 3.6 oz (3731 g) APGAR: 0, 0  Home with .fetal demise  Sandra Foster 10/29/2011, 12:09 PM

## 2011-10-29 NOTE — Discharge Summary (Signed)
  Summary of patient's prenatal and delivery record the patient was at term admitted in labor with a fetal demise which occurred more than likely the morning of admission at 24 weeks she had a diabetic screen which is abnormal a 26 weeks she had a 3 OGTT which was absolutely normal with subsequent visits her urine glucose was negative patient had no problems on to approximately 2 weeks prior to admission when she sits developed polyuria and polydipsia which was not reported and on questioning her after she was admitted she stated that she could not drink water without adding 3 spoons of sugar while in labor a glucose was obtained and this was 800 a repeat was 811 she was seen by critical care for help in reducing her glucose she had a normal vaginal delivery of 8 lbs. 3 oz. Stillborn female fetus her sugars were normal within 24 also delivered she was still evaluated by Dr. Margaretmary Bayley endocrinologist her hemoglobin A1c was 8.6 a C-peptide was 1.76 which was normal and over the next few days her sugars were normal and should be discharged with and you can't glucometer to check her sugars daily and report to Dr. Margaretmary Bayley for the problems he is calling her diagnosis hyperosmolar hyperglycemic state of pregnancy brought on by a jean for diabetes and stress on her pancreas which was unable to cope with the amount of sugar that she was taking in

## 2011-10-29 NOTE — Progress Notes (Signed)
  Nutrition Dx: Food and nutrition-related knowledge deficit r/t no previous education aeb newly diagnosed Diabetes.   Nutrition education consult for Carbohydrate Modified  Diabetic Diet completed.  "Meal  plan for Type 1 Diabetics" handout given to patient.  Basic concepts reviewed.  Questions answered.  Patient verbalizes understanding.

## 2011-11-30 DEATH — deceased

## 2012-06-02 ENCOUNTER — Emergency Department (HOSPITAL_COMMUNITY)
Admission: EM | Admit: 2012-06-02 | Discharge: 2012-06-02 | Disposition: A | Discharge status: Home / Self Care | Payer: Medicaid Other | Attending: Emergency Medicine | Admitting: Emergency Medicine

## 2012-06-02 ENCOUNTER — Emergency Department (HOSPITAL_COMMUNITY): Payer: Medicaid Other

## 2012-06-02 ENCOUNTER — Encounter (HOSPITAL_COMMUNITY): Payer: Self-pay | Admitting: *Deleted

## 2012-06-02 DIAGNOSIS — R079 Chest pain, unspecified: Secondary | ICD-10-CM | POA: Insufficient documentation

## 2012-06-02 DIAGNOSIS — N390 Urinary tract infection, site not specified: Secondary | ICD-10-CM | POA: Insufficient documentation

## 2012-06-02 DIAGNOSIS — F17200 Nicotine dependence, unspecified, uncomplicated: Secondary | ICD-10-CM | POA: Insufficient documentation

## 2012-06-02 LAB — COMPREHENSIVE METABOLIC PANEL
ALT: 15 U/L (ref 0–35)
Alkaline Phosphatase: 61 U/L (ref 39–117)
BUN: 13 mg/dL (ref 6–23)
CO2: 23 mEq/L (ref 19–32)
GFR calc Af Amer: >90 mL/min (ref >90)
GFR calc non Af Amer: >90 mL/min (ref >90)
Glucose, Bld: 91 mg/dL (ref 70–99)
Potassium: 3.5 mEq/L (ref 3.5–5.1)
Sodium: 136 mEq/L (ref 135–145)
Total Bilirubin: 0.2 mg/dL — ABNORMAL LOW (ref 0.3–1.2)

## 2012-06-02 LAB — CBC WITH DIFFERENTIAL/PLATELET
Hemoglobin: 11.3 g/dL — ABNORMAL LOW (ref 12.0–15.0)
Lymphocytes Relative: 16 % (ref 12–46)
Lymphs Abs: 1.7 10*3/uL (ref 0.7–4.0)
MCV: 83.9 fL (ref 78.0–100.0)
Monocytes Relative: 12 % (ref 3–12)
Neutrophils Relative %: 71 % (ref 43–77)
Platelets: 257 10*3/uL (ref 150–400)
RBC: 4.03 MIL/uL (ref 3.87–5.11)
WBC: 10.6 10*3/uL — ABNORMAL HIGH (ref 4.0–10.5)

## 2012-06-02 LAB — URINALYSIS, ROUTINE W REFLEX MICROSCOPIC
Bilirubin Urine: NEGATIVE
Ketones, ur: NEGATIVE mg/dL
Nitrite: POSITIVE — AB
Protein, ur: NEGATIVE mg/dL
Urobilinogen, UA: 1 mg/dL (ref 0.0–1.0)

## 2012-06-02 LAB — URINE MICROSCOPIC-ADD ON

## 2012-06-02 MED ORDER — OXYCODONE-ACETAMINOPHEN 5-325 MG PO TABS
1.0000 | ORAL_TABLET | Freq: Once | ORAL | Status: AC
  Administered 2012-06-02: 1 via ORAL
  Filled 2012-06-02: qty 1

## 2012-06-02 MED ORDER — OXYCODONE-ACETAMINOPHEN 5-325 MG PO TABS: 1.0000 | ORAL_TABLET | Freq: Four times a day (QID) | ORAL | Status: AC | PRN

## 2012-06-02 MED ORDER — CIPROFLOXACIN HCL 500 MG PO TABS: 500.0000 mg | ORAL_TABLET | Freq: Two times a day (BID) | ORAL | Status: AC

## 2012-06-02 NOTE — ED Notes (Signed)
Patient transported to X-ray 

## 2012-06-02 NOTE — ED Notes (Signed)
Pt to US.

## 2012-06-02 NOTE — ED Provider Notes (Signed)
Pt care was assumed from EDP Plunkett at shift change. Pt with right side pain overnight. Denies dysuria. Evidence of UTI on u/a. Very slight leukocytosis.    US Abdomen Complete  06/02/2012  *RADIOLOGY REPORT*  Clinical Data:  Right upper quadrant abdominal pain, evaluate gallbladder  COMPLETE ABDOMINAL ULTRASOUND  Comparison:  None.  Findings:  Gallbladder:  Sonographically normal.  No echogenic gallstones or gall sludge.  No gallbladder wall thickening or pericholecystic fluid.  Negative sonographic Murphy's sign.  Common bile duct:  Normal in size measuring 2.7 mm in diameter.  Liver:  Homogeneous hepatic echotexture.  No discrete hepatic lesions.  No definite evidence of intrahepatic biliary ductal dilatation.  No ascites.  IVC:  Appears normal.  Pancreas:  Limited visualization of the pancreatic head and neck is normal.  Visualization of the pancreatic body and tail is obscured by bowel gas.  Spleen:  Normal in size measuring 10.4 cm in length  Right Kidney:  Normal cortical thickness, echogenicity and size, measuring 12.5 cm in length.  No focal renal lesions.  No echogenic renal stones.  No urinary obstruction.  Left Kidney:  Normal cortical thickness, echogenicity and size, measuring 14.0 cm in length.  No focal renal lesions.  No echogenic renal stones.  No urinary obstruction.  Abdominal aorta:  No aneurysm identified.  IMPRESSION: No explanation for patient's right upper quadrant abdominal pain. Specifically, no evidence of cholelithiasis.  Original Report Authenticated By: Waynard Reeds, M.D.   [place] reviewed as above. No evidence of cholecystitis. On exam, mild right CVAT. No evidence of renal abnormality on [place]- suspect sig UTI vs very early pyelo. Will tx with abx, pain rx, culture urine. Abd re-exam otherwise benign. Return precautions discussed.     Shaaron Adler, [place] 06/02/12 [phone]

## 2012-06-02 NOTE — ED Notes (Addendum)
C/o R abd pain, was informed by her gyn Dr. Gaynell Face that she has an STD, seen in office 7/12.

## 2012-06-02 NOTE — ED Notes (Signed)
Abdominal pain since last week wed, stated having her period and not sure of any discharges, stated her period has a different odor smell. Took some tylenol  and it didn't help with the abdominal pain. Denies any fever.

## 2012-06-02 NOTE — ED Provider Notes (Addendum)
History     CSN: [id number]  Arrival date & time 06/02/12  0506   First MD Initiated Contact with Patient 06/02/12 [phone]      Chief Complaint  Patient presents with  . Abdominal Pain    (Consider location/radiation/quality/duration/timing/severity/associated sxs/prior treatment) Patient is a 21 y.o. female presenting with abdominal pain. The history is provided by the patient.  Abdominal Pain The primary symptoms of the illness include abdominal pain. The primary symptoms of the illness do not include fever, nausea, vomiting, diarrhea, dysuria, vaginal discharge or vaginal bleeding. Episode onset: 1 week ago. The onset of the illness was gradual. The problem has been gradually worsening.  Associated with: smoker but no alcohol use.  states she is in school so stands all day long.  worse with change in positions. The patient states that she believes she is currently not pregnant. The patient has not had a change in bowel habit. Additional symptoms associated with the illness include back pain. Symptoms associated with the illness do not include chills, diaphoresis, constipation, urgency or frequency.    Past Medical History  Diagnosis Date  . Hernia   . No pertinent past medical history     Past Surgical History  Procedure Date  . Hernia repair     Family History  Problem Relation Age of Onset  . Hypertension Maternal Grandmother     History  Substance Use Topics  . Smoking status: Current Everyday Smoker -- 0.2 packs/day  . Smokeless tobacco: Former Neurosurgeon    Quit date: 06/10/2011  . Alcohol Use: No    OB History    Grav Para Term Preterm Abortions TAB SAB Ect Mult Living   1 1 1        0      Review of Systems  Constitutional: Negative for fever, chills and diaphoresis.  Respiratory: Negative for cough.        States the pain was so bad tonight it made her feel SOB  Gastrointestinal: Positive for abdominal pain. Negative for nausea, vomiting, diarrhea and  constipation.  Genitourinary: Negative for dysuria, urgency, frequency, vaginal bleeding, vaginal discharge and pelvic pain.  Musculoskeletal: Positive for back pain.  All other systems reviewed and are negative.    Allergies  Review of patient's allergies indicates no known allergies.  Home Medications   Current Outpatient Rx  Name Route Sig Dispense Refill  . ACETAMINOPHEN 500 MG PO TABS Oral Take 500 mg by mouth every 6 (six) hours as needed. For pain    . MEDROXYPROGESTERONE ACETATE 150 MG/ML IM SUSP Intramuscular Inject 1 mL (150 mg total) into the muscle every 3 (three) months. 1 mL 0    BP 101/57  Pulse 73  Temp 98.5 F (36.9 C) (Oral)  Resp 16  SpO2 99%  Physical Exam  Nursing note and vitals reviewed. Constitutional: She is oriented to person, place, and time. She appears well-developed and well-nourished. No distress.  HENT:  Head: Normocephalic and atraumatic.  Eyes: EOM are normal. Pupils are equal, round, and reactive to light.  Cardiovascular: Normal rate, regular rhythm, normal heart sounds and intact distal pulses.  Exam reveals no friction rub.   No murmur heard. Pulmonary/Chest: Effort normal and breath sounds normal. She has no wheezes. She has no rales.    Abdominal: Soft. Normal appearance and bowel sounds are normal. She exhibits no distension. There is no hepatomegaly. There is tenderness in the right upper quadrant. There is positive Murphy's sign. There is no rebound and no  guarding.  Musculoskeletal: Normal range of motion. She exhibits no tenderness.       No edema  Neurological: She is alert and oriented to person, place, and time. No cranial nerve deficit.  Skin: Skin is warm and dry. No rash noted.  Psychiatric: She has a normal mood and affect. Her behavior is normal.    ED Course  Procedures (including critical care time)  Labs Reviewed - No data to display Dg Chest 2 View  06/02/2012  *RADIOLOGY REPORT*  Clinical Data: Right lower chest  pain and flank pain.  No injury.  CHEST - 2 VIEW  Comparison: None.  Findings: The heart size and pulmonary vascularity are normal. The lungs appear clear and expanded without focal air space disease or consolidation. No blunting of the costophrenic angles.  No pneumothorax.  IMPRESSION: No evidence of active pulmonary disease.  Original Report Authenticated By: Marlon Pel, M.D.     No diagnosis found.    MDM   Patient with abdominal pain for the last one week. She describes it in her flank and right side of abdomen. She states the pain was so bad today it made her feel short of breath however she denies feeling short of breath currently and is satting 99% on room air. She denies any respiratory symptoms but also denies nausea or vomiting. The pain is not worse with eating and has not had any dysuria, vaginal discharge or other complaints in the lower GI area. On exam she has significant pain in the right upper quadrant concerning for possible gallbladder etiology and in no right lower rib area. She is PERC negative because the only birth control she is on is Depo.  CBC, CMP, CXR, UA and UPT pending.  7:20 AM Labs normal except for signs of UTI.  U/s pending.     Gwyneth Sprout, MD 06/02/12 1610  Gwyneth Sprout, MD 06/02/12 [phone]

## 2012-06-03 LAB — URINE CULTURE

## 2012-06-04 NOTE — ED Notes (Signed)
Chart returned from EDP office +urine No sensitivity listed.

## 2012-06-05 NOTE — ED Provider Notes (Signed)
Medical screening examination/treatment/procedure(s) were conducted as a shared visit with non-physician practitioner(s) and myself.  I personally evaluated the patient during the encounter   Gwyneth Sprout, MD 06/05/12 [phone]

## 2012-06-08 NOTE — ED Notes (Signed)
Chart back from MD office.  Chart reviewed by Emilia Beck PA "Amoxicillin/Clavulanate 500 mg (Augmentin) 1 tablet po BID x 3 days, # 9) 8/11 Left message for pt to return call.

## 2012-06-09 NOTE — ED Notes (Signed)
Message left for patient to return call.

## 2012-06-15 ENCOUNTER — Emergency Department (HOSPITAL_COMMUNITY)
Admission: EM | Admit: 2012-06-15 | Discharge: 2012-06-15 | Disposition: A | Discharge status: Home / Self Care | Payer: Medicaid Other | Attending: Emergency Medicine | Admitting: Emergency Medicine

## 2012-06-15 ENCOUNTER — Encounter (HOSPITAL_COMMUNITY): Payer: Self-pay | Admitting: *Deleted

## 2012-06-15 DIAGNOSIS — L089 Local infection of the skin and subcutaneous tissue, unspecified: Secondary | ICD-10-CM

## 2012-06-15 DIAGNOSIS — F17200 Nicotine dependence, unspecified, uncomplicated: Secondary | ICD-10-CM | POA: Insufficient documentation

## 2012-06-15 DIAGNOSIS — IMO0002 Reserved for concepts with insufficient information to code with codable children: Secondary | ICD-10-CM | POA: Insufficient documentation

## 2012-06-15 MED ORDER — DOXYCYCLINE HYCLATE 100 MG PO CAPS: 100.0000 mg | ORAL_CAPSULE | Freq: Two times a day (BID) | ORAL | Status: AC

## 2012-06-15 NOTE — ED Notes (Signed)
Pt with abscess to left axillary, reports trying to "bust" it yesterday and woke up this am and it was larger. Denies fevers.

## 2012-06-15 NOTE — ED Provider Notes (Signed)
History  This chart was scribed for Sandra Lennert, MD by Sandra Foster. This patient was seen in room TR05C/TR05C and the patient's care was started at 13:14.   CSN: [id number]  Arrival date & time 06/15/12  1250   First MD Initiated Contact with Patient 06/15/12 1314      Chief Complaint  Patient presents with  . Abscess    (Consider location/radiation/quality/duration/timing/severity/associated sxs/prior Treatment) Sandra Foster is a 20 y.o. female who presents to the Emergency Department complaining of an axillary abscess since yesterday. Pt denies any previous episodes of similar symptoms. Pt has NKDA.  Patient is a 21 y.o. female presenting with abscess. The history is provided by the patient. No language interpreter was used.  Abscess  This is a new problem. The current episode started yesterday. The onset is undetermined. The problem occurs continuously. The problem has been gradually worsening. Affected Location: left axilla. The problem is moderate. The abscess is characterized by painfulness. The abscess first occurred at home. Pertinent negatives include no fever and no vomiting.    Past Medical History  Diagnosis Date  . Hernia   . No pertinent past medical history     Past Surgical History  Procedure Date  . Hernia repair     Family History  Problem Relation Age of Onset  . Hypertension Maternal Grandmother     History  Substance Use Topics  . Smoking status: Current Everyday Smoker -- 0.2 packs/day  . Smokeless tobacco: Former Neurosurgeon    Quit date: 06/10/2011  . Alcohol Use: No    OB History    Grav Para Term Preterm Abortions TAB SAB Ect Mult Living   1 1 1        0      Review of Systems  Constitutional: Negative for fever and chills.  Respiratory: Negative for shortness of breath.   Gastrointestinal: Negative for nausea and vomiting.  Genitourinary: Negative for dysuria.  Skin: Positive for wound.  Neurological: Negative for weakness.     Allergies  Review of patient's allergies indicates no known allergies.  Home Medications   Current Outpatient Rx  Name Route Sig Dispense Refill  . ACETAMINOPHEN 500 MG PO TABS Oral Take 500 mg by mouth every 6 (six) hours as needed. For pain    . MEDROXYPROGESTERONE ACETATE 150 MG/ML IM SUSP Intramuscular Inject 1 mL (150 mg total) into the muscle every 3 (three) months. 1 mL 0    BP 111/61  Pulse 74  Temp 98.5 F (36.9 C) (Oral)  Resp 18  SpO2 100%  Physical Exam  Constitutional: She is oriented to person, place, and time. She appears well-developed and well-nourished. No distress.  HENT:  Head: Normocephalic and atraumatic.  Eyes: Conjunctivae are normal.  Neck: No tracheal deviation present.  Cardiovascular:  No murmur heard. Pulmonary/Chest: Effort normal.  Musculoskeletal: Normal range of motion.       Left axilla: 1cm in diameter area that is erythematous and indurated.   Neurological: She is oriented to person, place, and time.  Skin: Skin is warm.  Psychiatric: She has a normal mood and affect.    ED Course  Procedures (including critical care time) DIAGNOSTIC STUDIES: Oxygen Saturation is 100% on room air, normal by my interpretation.    COORDINATION OF CARE: 13:19--I evaluated the patient and we discussed a treatment plan including antibiotics to which the pt agreed. I recommended the pt come back in 2-3 days if her symptoms are not improving.  Labs Reviewed - No data to display No results found.   No diagnosis found.    MDM       The chart was scribed for me under my direct supervision.  I personally performed the history, physical, and medical decision making and all procedures in the evaluation of this patient.Sandra Lennert, MD 06/15/12 [phone]

## 2012-07-31 ENCOUNTER — Emergency Department (HOSPITAL_COMMUNITY)
Admission: EM | Admit: 2012-07-31 | Discharge: 2012-07-31 | Disposition: A | Discharge status: Home / Self Care | Payer: Medicaid Other | Attending: Emergency Medicine | Admitting: Emergency Medicine

## 2012-07-31 ENCOUNTER — Encounter (HOSPITAL_COMMUNITY): Payer: Self-pay | Admitting: *Deleted

## 2012-07-31 DIAGNOSIS — S139XXA Sprain of joints and ligaments of unspecified parts of neck, initial encounter: Secondary | ICD-10-CM | POA: Insufficient documentation

## 2012-07-31 DIAGNOSIS — Y92410 Unspecified street and highway as the place of occurrence of the external cause: Secondary | ICD-10-CM | POA: Insufficient documentation

## 2012-07-31 DIAGNOSIS — F17200 Nicotine dependence, unspecified, uncomplicated: Secondary | ICD-10-CM | POA: Insufficient documentation

## 2012-07-31 DIAGNOSIS — Z8249 Family history of ischemic heart disease and other diseases of the circulatory system: Secondary | ICD-10-CM | POA: Insufficient documentation

## 2012-07-31 DIAGNOSIS — S161XXA Strain of muscle, fascia and tendon at neck level, initial encounter: Secondary | ICD-10-CM

## 2012-07-31 MED ORDER — IBUPROFEN 400 MG PO TABS
600.0000 mg | ORAL_TABLET | Freq: Once | ORAL | Status: AC
  Administered 2012-07-31: 600 mg via ORAL
  Filled 2012-07-31: qty 1
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
## 2012-12-08 ENCOUNTER — Emergency Department (HOSPITAL_COMMUNITY)
Admission: EM | Admit: 2012-12-08 | Discharge: 2012-12-08 | Disposition: A | Discharge status: Home / Self Care | Payer: Self-pay | Attending: Emergency Medicine | Admitting: Emergency Medicine

## 2012-12-08 ENCOUNTER — Encounter (HOSPITAL_COMMUNITY): Payer: Self-pay | Admitting: Emergency Medicine

## 2012-12-08 DIAGNOSIS — Z3202 Encounter for pregnancy test, result negative: Secondary | ICD-10-CM | POA: Insufficient documentation

## 2012-12-08 DIAGNOSIS — R1031 Right lower quadrant pain: Secondary | ICD-10-CM | POA: Insufficient documentation

## 2012-12-08 DIAGNOSIS — Z8719 Personal history of other diseases of the digestive system: Secondary | ICD-10-CM | POA: Insufficient documentation

## 2012-12-08 DIAGNOSIS — Z87891 Personal history of nicotine dependence: Secondary | ICD-10-CM | POA: Insufficient documentation

## 2012-12-08 DIAGNOSIS — R109 Unspecified abdominal pain: Secondary | ICD-10-CM

## 2012-12-08 DIAGNOSIS — N760 Acute vaginitis: Secondary | ICD-10-CM | POA: Insufficient documentation

## 2012-12-08 LAB — URINALYSIS, ROUTINE W REFLEX MICROSCOPIC
Bilirubin Urine: NEGATIVE
Glucose, UA: NEGATIVE mg/dL
Hgb urine dipstick: NEGATIVE
Specific Gravity, Urine: 1.017 (ref 1.005–1.030)
pH: 6.5 (ref 5.0–8.0)

## 2012-12-08 LAB — CBC WITH DIFFERENTIAL/PLATELET
Basophils Relative: 1 % (ref 0–1)
HCT: 38 % (ref 36.0–46.0)
Hemoglobin: 12.9 g/dL (ref 12.0–15.0)
Lymphocytes Relative: 33 % (ref 12–46)
Lymphs Abs: 2.4 10*3/uL (ref 0.7–4.0)
MCHC: 33.9 g/dL (ref 30.0–36.0)
Monocytes Absolute: 0.5 10*3/uL (ref 0.1–1.0)
Monocytes Relative: 7 % (ref 3–12)
Neutro Abs: 4.2 10*3/uL (ref 1.7–7.7)

## 2012-12-08 LAB — WET PREP, GENITAL
Trich, Wet Prep: NONE SEEN
Yeast Wet Prep HPF POC: NONE SEEN

## 2012-12-08 LAB — URINE MICROSCOPIC-ADD ON

## 2012-12-08 MED ORDER — METRONIDAZOLE 500 MG PO TABS: 500.0000 mg | ORAL_TABLET | Freq: Two times a day (BID) | ORAL | Status: DC

## 2012-12-08 NOTE — ED Provider Notes (Signed)
History     CSN: [id number]  Arrival date & time 12/08/12  1528   First MD Initiated Contact with Patient 12/08/12 1828      No chief complaint on file.   (Consider location/radiation/quality/duration/timing/severity/associated sxs/prior treatment) The history is provided by the patient.  patient presents with crampy lower abdominal pain. She states that it feels like her period cramps, but she finished her period 3 days ago. She states that she always gets cramps, but they always go away when her period stops. No vaginal bleeding or discharge. No dysuria.   Past Medical History  Diagnosis Date  . Hernia   . No pertinent past medical history     Past Surgical History  Procedure Laterality Date  . Hernia repair      Family History  Problem Relation Age of Onset  . Hypertension Maternal Grandmother     History  Substance Use Topics  . Smoking status: Current Every Day Smoker -- 0.25 packs/day  . Smokeless tobacco: Former Neurosurgeon    Quit date: 06/10/2011  . Alcohol Use: No    OB History   Grav Para Term Preterm Abortions TAB SAB Ect Mult Living   1 1 1        0      Review of Systems  Constitutional: Negative for activity change and appetite change.  HENT: Negative for neck stiffness.   Eyes: Negative for pain.  Respiratory: Negative for chest tightness and shortness of breath.   Cardiovascular: Negative for chest pain and leg swelling.  Gastrointestinal: Positive for abdominal pain. Negative for nausea, vomiting and diarrhea.  Genitourinary: Negative for flank pain.  Musculoskeletal: Negative for back pain.  Skin: Negative for rash.  Neurological: Negative for weakness, numbness and headaches.  Psychiatric/Behavioral: Negative for behavioral problems.    Allergies  Review of patient's allergies indicates no known allergies.  Home Medications   Current Outpatient Rx  Name  Route  Sig  Dispense  Refill  . medroxyPROGESTERone (DEPO-PROVERA) 150 MG/ML  injection   Intramuscular   Inject 1 mL (150 mg total) into the muscle every 3 (three) months.   1 mL   0   . metroNIDAZOLE (FLAGYL) 500 MG tablet   Oral   Take 1 tablet (500 mg total) by mouth 2 (two) times daily.   14 tablet   0     BP 119/67  Pulse 68  Temp(Src) 98.8 F (37.1 C) (Oral)  Resp 18  SpO2 100%  Physical Exam  Nursing note and vitals reviewed. Constitutional: She is oriented to person, place, and time. She appears well-developed and well-nourished.  HENT:  Head: Normocephalic and atraumatic.  Eyes: EOM are normal. Pupils are equal, round, and reactive to light.  Neck: Normal range of motion. Neck supple.  Cardiovascular: Normal rate, regular rhythm and normal heart sounds.   No murmur heard. Pulmonary/Chest: Effort normal and breath sounds normal. No respiratory distress. She has no wheezes. She has no rales.  Abdominal: Soft. Bowel sounds are normal. She exhibits no distension. There is tenderness. There is no rebound and no guarding.  Mild lower abdominal tenderness without rebound or guarding.   Musculoskeletal: Normal range of motion.  Neurological: She is alert and oriented to person, place, and time. No cranial nerve deficit.  Skin: Skin is warm and dry.  Psychiatric: She has a normal mood and affect. Her speech is normal.   no cervical motion tenderness. White vaginal discharge.  ED Course  Procedures (including critical care time)  Labs Reviewed  WET PREP, GENITAL - Abnormal; Notable for the following:    Clue Cells Wet Prep HPF POC FEW (*)    WBC, Wet Prep HPF POC MODERATE (*)    All other components within normal limits  URINALYSIS, ROUTINE W REFLEX MICROSCOPIC - Abnormal; Notable for the following:    APPearance CLOUDY (*)    Nitrite POSITIVE (*)    Leukocytes, UA SMALL (*)    All other components within normal limits  URINE MICROSCOPIC-ADD ON - Abnormal; Notable for the following:    Squamous Epithelial / LPF MANY (*)    Bacteria, UA FEW  (*)    All other components within normal limits  URINE CULTURE  GC/CHLAMYDIA PROBE AMP  CBC WITH DIFFERENTIAL  POCT PREGNANCY, URINE   No results found.   1. Abdominal pain   2. Bacterial vaginosis       MDM  Patient with right lower quadrant pain. Feels the previous menstrual cramps, but she recently finished her period. Lab work is reassuring. She has bacterial vaginosis on the pelvic exam. She has few bacteria in the urine but no urinary symptoms. Urine culture is sent. She'll be discharged home. Appendicitis is not been completely ruled out. She was given instructions on what to watch for        Juliet Rude. Rubin Payor, MD 12/08/12 2007

## 2012-12-08 NOTE — ED Notes (Signed)
Went off her cycle 3 days ago but is still having cramping denies dysuria or vag d/c  Rt side hurts worse that left

## 2012-12-08 NOTE — ED Notes (Signed)
Pt discharged.Vital signs stable and GCS 15 

## 2012-12-09 LAB — GC/CHLAMYDIA PROBE AMP
CT Probe RNA: NEGATIVE
GC Probe RNA: NEGATIVE

## 2012-12-09 LAB — URINE CULTURE

## 2013-04-14 ENCOUNTER — Encounter (HOSPITAL_COMMUNITY): Payer: Self-pay

## 2013-04-14 ENCOUNTER — Inpatient Hospital Stay (HOSPITAL_COMMUNITY)
Admission: AD | Admit: 2013-04-14 | Discharge: 2013-04-14 | Disposition: A | Discharge status: Home / Self Care | Payer: Self-pay | Source: Ambulatory Visit | Attending: Family Medicine | Admitting: Family Medicine

## 2013-04-14 DIAGNOSIS — N920 Excessive and frequent menstruation with regular cycle: Secondary | ICD-10-CM | POA: Insufficient documentation

## 2013-04-14 DIAGNOSIS — N390 Urinary tract infection, site not specified: Secondary | ICD-10-CM | POA: Insufficient documentation

## 2013-04-14 DIAGNOSIS — Z3202 Encounter for pregnancy test, result negative: Secondary | ICD-10-CM | POA: Insufficient documentation

## 2013-04-14 HISTORY — DX: Chlamydial infection, unspecified: A74.9

## 2013-04-14 LAB — URINE MICROSCOPIC-ADD ON

## 2013-04-14 LAB — URINALYSIS, ROUTINE W REFLEX MICROSCOPIC
Bilirubin Urine: NEGATIVE
Glucose, UA: NEGATIVE mg/dL
Hgb urine dipstick: NEGATIVE
Protein, ur: NEGATIVE mg/dL
Specific Gravity, Urine: 1.015 (ref 1.005–1.030)
Urobilinogen, UA: 0.2 mg/dL (ref 0.0–1.0)

## 2013-04-14 MED ORDER — SULFAMETHOXAZOLE-TMP DS 800-160 MG PO TABS: 1.0000 | ORAL_TABLET | Freq: Two times a day (BID) | ORAL | Status: DC

## 2013-04-14 MED ORDER — MEDROXYPROGESTERONE ACETATE 150 MG/ML IM SUSP
150.0000 mg | Freq: Once | INTRAMUSCULAR | Status: AC
  Administered 2013-04-14: 150 mg via INTRAMUSCULAR
  Filled 2013-04-14: qty 1

## 2013-04-14 NOTE — MAU Provider Note (Signed)
Chart reviewed and agree with management and plan.  

## 2013-04-14 NOTE — MAU Provider Note (Signed)
History     CSN: [id number]  Arrival date and time: 04/14/13 1121   First Provider Initiated Contact with Patient 04/14/13 1302      Chief Complaint  Patient presents with  . Vaginal Bleeding  . Amenorrhea   HPI 22 y.o. female here requesting pregnancy test. Pt states she had a positive UPT at home at the beginning of June, then had bleeding from 6/7-6/10. Unsure if she is pregnant or had a miscarriage. H/O term IUFD last year. No bleeding or pain now.   Past Medical History  Diagnosis Date  . Hernia   . No pertinent past medical history   . Chlamydia     Past Surgical History  Procedure Laterality Date  . Hernia repair      Family History  Problem Relation Age of Onset  . Hypertension Maternal Grandmother     History  Substance Use Topics  . Smoking status: Current Every Day Smoker -- 0.25 packs/day  . Smokeless tobacco: Former Neurosurgeon    Quit date: 06/10/2011  . Alcohol Use: No    Allergies: No Known Allergies  No prescriptions prior to admission    Review of Systems  Constitutional: Negative.   Respiratory: Negative.   Cardiovascular: Negative.   Gastrointestinal: Negative for nausea, vomiting, abdominal pain, diarrhea and constipation.  Genitourinary: Negative for dysuria, urgency, frequency, hematuria and flank pain.       Negative for vaginal bleeding, vaginal discharge, dyspareunia  Musculoskeletal: Negative.   Neurological: Negative.   Psychiatric/Behavioral: Negative.    Physical Exam   Blood pressure 110/57, pulse 70, temperature 97.4 F (36.3 C), temperature source Oral, resp. rate 16, height 5\' 6"  (1.676 m), weight 154 lb 4 oz (69.967 kg), last menstrual period 03/14/2013.  Physical Exam  Nursing note and vitals reviewed. Constitutional: She is oriented to person, place, and time. She appears well-developed and well-nourished. No distress.  Cardiovascular: Normal rate.   Respiratory: Effort normal.  Neurological: She is alert and oriented  to person, place, and time.  Skin: Skin is warm and dry.  Psychiatric: She has a normal mood and affect.    MAU Course  Procedures Results for orders placed during the hospital encounter of 04/14/13 (from the past 24 hour(s))  URINALYSIS, ROUTINE W REFLEX MICROSCOPIC     Status: Abnormal   Collection Time    04/14/13 11:31 AM      Result Value Range   Color, Urine YELLOW  YELLOW   APPearance CLOUDY (*) CLEAR   Specific Gravity, Urine 1.015  1.005 - 1.030   pH 6.0  5.0 - 8.0   Glucose, UA NEGATIVE  NEGATIVE mg/dL   Hgb urine dipstick NEGATIVE  NEGATIVE   Bilirubin Urine NEGATIVE  NEGATIVE   Ketones, ur NEGATIVE  NEGATIVE mg/dL   Protein, ur NEGATIVE  NEGATIVE mg/dL   Urobilinogen, UA 0.2  0.0 - 1.0 mg/dL   Nitrite POSITIVE (*) NEGATIVE   Leukocytes, UA SMALL (*) NEGATIVE  URINE MICROSCOPIC-ADD ON     Status: Abnormal   Collection Time    04/14/13 11:31 AM      Result Value Range   Squamous Epithelial / LPF FEW (*) RARE   WBC, UA 3-6  <3 WBC/hpf   Bacteria, UA FEW (*) RARE  POCT PREGNANCY, URINE     Status: None   Collection Time    04/14/13 11:36 AM      Result Value Range   Preg Test, Ur NEGATIVE  NEGATIVE  HCG, SERUM, QUALITATIVE  Status: None   Collection Time    04/14/13 12:00 PM      Result Value Range   Preg, Serum NEGATIVE  NEGATIVE     Assessment and Plan   1. Negative pregnancy test   2. UTI (urinary tract infection)   Negative UPT and serum pregnancy test today. Pt does not desire pregnancy, Depo Provera in MAU today, will f/u in clinic for ongoing contraceptive mgmt.  Treat for UTI with Bactrim today    Medication List    TAKE these medications       sulfamethoxazole-trimethoprim 800-160 MG per tablet  Commonly known as:  BACTRIM DS  Take 1 tablet by mouth 2 (two) times daily.            Follow-up Information   Follow up with [facility]. (someone will call to schedule appointment)    Contact information:   [address] [place] [address] [phone]        [facility] 04/14/2013, 2:03 PM

## 2013-04-14 NOTE — MAU Note (Signed)
Patient is in to verify if she is pregnant. She states that her last regular menstrual was 03/14/13. She had a positive home pregnancy test, then she started bleeding on June 7th through June 10th. She is concerned, having had an term IUFD last year. She denies pain or bleeding now.

## 2013-04-15 LAB — URINE CULTURE

## 2013-05-13 ENCOUNTER — Emergency Department (HOSPITAL_COMMUNITY)
Admission: EM | Admit: 2013-05-13 | Discharge: 2013-05-13 | Disposition: A | Discharge status: Home / Self Care | Payer: Self-pay | Attending: Emergency Medicine | Admitting: Emergency Medicine

## 2013-05-13 ENCOUNTER — Encounter (HOSPITAL_COMMUNITY): Payer: Self-pay | Admitting: Emergency Medicine

## 2013-05-13 DIAGNOSIS — Z8719 Personal history of other diseases of the digestive system: Secondary | ICD-10-CM | POA: Insufficient documentation

## 2013-05-13 DIAGNOSIS — Z8619 Personal history of other infectious and parasitic diseases: Secondary | ICD-10-CM | POA: Insufficient documentation

## 2013-05-13 DIAGNOSIS — N12 Tubulo-interstitial nephritis, not specified as acute or chronic: Secondary | ICD-10-CM | POA: Insufficient documentation

## 2013-05-13 DIAGNOSIS — R110 Nausea: Secondary | ICD-10-CM | POA: Insufficient documentation

## 2013-05-13 DIAGNOSIS — R109 Unspecified abdominal pain: Secondary | ICD-10-CM | POA: Insufficient documentation

## 2013-05-13 DIAGNOSIS — R51 Headache: Secondary | ICD-10-CM | POA: Insufficient documentation

## 2013-05-13 DIAGNOSIS — R509 Fever, unspecified: Secondary | ICD-10-CM | POA: Insufficient documentation

## 2013-05-13 DIAGNOSIS — R000 Tachycardia, unspecified: Secondary | ICD-10-CM | POA: Insufficient documentation

## 2013-05-13 DIAGNOSIS — F17200 Nicotine dependence, unspecified, uncomplicated: Secondary | ICD-10-CM | POA: Insufficient documentation

## 2013-05-13 DIAGNOSIS — E876 Hypokalemia: Secondary | ICD-10-CM | POA: Insufficient documentation

## 2013-05-13 LAB — COMPREHENSIVE METABOLIC PANEL
AST: 24 U/L (ref 0–37)
BUN: 11 mg/dL (ref 6–23)
CO2: 23 mEq/L (ref 19–32)
Calcium: 9.1 mg/dL (ref 8.4–10.5)
Creatinine, Ser: 0.82 mg/dL (ref 0.50–1.10)
GFR calc Af Amer: >90 mL/min (ref >90)
GFR calc non Af Amer: >90 mL/min (ref >90)
Glucose, Bld: 126 mg/dL — ABNORMAL HIGH (ref 70–99)

## 2013-05-13 LAB — CBC
MCH: 29.1 pg (ref 26.0–34.0)
MCV: 84.8 fL (ref 78.0–100.0)
Platelets: 159 10*3/uL (ref 150–400)
RBC: 4.09 MIL/uL (ref 3.87–5.11)

## 2013-05-13 LAB — URINE MICROSCOPIC-ADD ON

## 2013-05-13 LAB — URINALYSIS, ROUTINE W REFLEX MICROSCOPIC
Bilirubin Urine: NEGATIVE
Specific Gravity, Urine: 1.011 (ref 1.005–1.030)
Urobilinogen, UA: 4 mg/dL — ABNORMAL HIGH (ref 0.0–1.0)

## 2013-05-13 MED ORDER — SODIUM CHLORIDE 0.9 % IV BOLUS (SEPSIS)
500.0000 mL | Freq: Once | INTRAVENOUS | Status: AC
  Administered 2013-05-13: 500 mL via INTRAVENOUS

## 2013-05-13 MED ORDER — POTASSIUM CHLORIDE CRYS ER 20 MEQ PO TBCR: 20.0000 meq | EXTENDED_RELEASE_TABLET | Freq: Every day | ORAL | Status: DC

## 2013-05-13 MED ORDER — DEXTROSE 5 % IV SOLN
1.0000 g | Freq: Once | INTRAVENOUS | Status: AC
  Administered 2013-05-13: 1 g via INTRAVENOUS
  Filled 2013-05-13: qty 10

## 2013-05-13 MED ORDER — CIPROFLOXACIN HCL 500 MG PO TABS: 500.0000 mg | ORAL_TABLET | Freq: Two times a day (BID) | ORAL | Status: DC

## 2013-05-13 MED ORDER — SODIUM CHLORIDE 0.9 % IV SOLN
Freq: Once | INTRAVENOUS | Status: AC
  Administered 2013-05-13: 01:00:00 via INTRAVENOUS

## 2013-05-13 MED ORDER — POTASSIUM CHLORIDE CRYS ER 20 MEQ PO TBCR
40.0000 meq | EXTENDED_RELEASE_TABLET | Freq: Once | ORAL | Status: AC
  Administered 2013-05-13: 40 meq via ORAL
  Filled 2013-05-13: qty 2

## 2013-05-13 MED ORDER — POTASSIUM CHLORIDE 10 MEQ/100ML IV SOLN
10.0000 meq | INTRAVENOUS | Status: AC
  Administered 2013-05-13 (×3): 10 meq via INTRAVENOUS
  Filled 2013-05-13: qty 300

## 2013-05-13 MED ORDER — ACETAMINOPHEN 325 MG PO TABS
650.0000 mg | ORAL_TABLET | Freq: Once | ORAL | Status: AC
  Administered 2013-05-13: 650 mg via ORAL
  Filled 2013-05-13: qty 2

## 2013-05-13 NOTE — ED Provider Notes (Signed)
History    CSN: [id number] Arrival date & time 05/13/13  0014  First MD Initiated Contact with Patient 05/13/13 0018     Chief Complaint  Patient presents with  . Dysuria  . Fever   (Consider location/radiation/quality/duration/timing/severity/associated sxs/prior Treatment) HPI Comments: Dx with UTI 04/14/2013 but has not filled RX now with chills, fever, nausea, headache and dysuria   Patient is a 22 y.o. female presenting with dysuria and fever. The history is provided by the patient.  Dysuria Pain quality:  Burning Pain severity:  Mild Onset quality:  Gradual Duration:  3 weeks Timing:  Constant Progression:  Unchanged Recent urinary tract infections: yes   Relieved by:  None tried Worsened by:  Nothing tried Associated symptoms: abdominal pain, fever and nausea   Associated symptoms: no flank pain, no vaginal discharge and no vomiting   Associated symptoms comment:  Headache Risk factors: sexually active   Risk factors: no hx of pyelonephritis, no hx of urolithiasis, not pregnant, no recurrent urinary tract infections, no sexually transmitted infections and no urinary catheter   Fever Associated symptoms: dysuria, headaches and nausea   Associated symptoms: no vomiting    Past Medical History  Diagnosis Date  . Hernia   . No pertinent past medical history   . Chlamydia    Past Surgical History  Procedure Laterality Date  . Hernia repair     Family History  Problem Relation Age of Onset  . Hypertension Maternal Grandmother    History  Substance Use Topics  . Smoking status: Current Every Day Smoker -- 0.25 packs/day  . Smokeless tobacco: Former Neurosurgeon    Quit date: 06/10/2011  . Alcohol Use: No   OB History   Grav Para Term Preterm Abortions TAB SAB Ect Mult Living   2 1 1  1 1     0     Review of Systems  Constitutional: Positive for fever.  Gastrointestinal: Positive for nausea and abdominal pain. Negative for vomiting.  Genitourinary: Positive for  dysuria. Negative for hematuria, flank pain, vaginal bleeding and vaginal discharge.  Neurological: Positive for headaches. Negative for dizziness.  All other systems reviewed and are negative.    Allergies  Review of patient's allergies indicates no known allergies.  Home Medications   Current Outpatient Rx  Name  Route  Sig  Dispense  Refill  . acetaminophen (TYLENOL) 325 MG tablet   Oral   Take 650 mg by mouth every 6 (six) hours as needed for pain.         . medroxyPROGESTERone (DEPO-SUBQ PROVERA) 104 MG/0.65ML injection   Subcutaneous   Inject 104 mg into the skin every 3 (three) months.         . ciprofloxacin (CIPRO) 500 MG tablet   Oral   Take 1 tablet (500 mg total) by mouth every 12 (twelve) hours.   20 tablet   0   . potassium chloride SA (K-DUR,KLOR-CON) 20 MEQ tablet   Oral   Take 1 tablet (20 mEq total) by mouth daily.   4 tablet   0    BP 98/53  Pulse 68  Temp(Src) 98.7 F (37.1 C) (Oral)  Resp 18  SpO2 100%  LMP 03/14/2013 Physical Exam  Vitals reviewed. Constitutional: She is oriented to person, place, and time. She appears well-developed and well-nourished.  HENT:  Head: Normocephalic.  Eyes: Pupils are equal, round, and reactive to light.  Neck: Normal range of motion.  Cardiovascular: Regular rhythm.  Tachycardia present.  Pulmonary/Chest: Effort normal and breath sounds normal.  Abdominal: Soft. Bowel sounds are normal. She exhibits no distension. There is no tenderness.  Musculoskeletal: Normal range of motion. She exhibits no edema and no tenderness.  Neurological: She is alert and oriented to person, place, and time.  Skin: Skin is warm. No rash noted. No erythema.    ED Course  Procedures (including critical care time) Labs Reviewed  URINALYSIS, ROUTINE W REFLEX MICROSCOPIC - Abnormal; Notable for the following:    APPearance CLOUDY (*)    Hgb urine dipstick SMALL (*)    Protein, ur 30 (*)    Urobilinogen, UA 4.0 (*)     Leukocytes, UA LARGE (*)    All other components within normal limits  CBC - Abnormal; Notable for the following:    WBC 11.0 (*)    Hemoglobin 11.9 (*)    HCT 34.7 (*)    All other components within normal limits  COMPREHENSIVE METABOLIC PANEL - Abnormal; Notable for the following:    Potassium 2.9 (*)    Glucose, Bld 126 (*)    All other components within normal limits  URINE MICROSCOPIC-ADD ON - Abnormal; Notable for the following:    Bacteria, UA MANY (*)    All other components within normal limits  URINE CULTURE   No results found. 1. Hypokalemia   2. Pyelonephritis     MDM   treated  Arman Filter, NP 05/17/13 [phone]

## 2013-05-13 NOTE — ED Notes (Signed)
PT. REPORTS DYSURIA WITH MALODOROUS URINE /FEVER , CHILLS AND SORE THROAT.

## 2013-05-13 NOTE — ED Provider Notes (Signed)
Medical screening examination/treatment/procedure(s) were performed by non-physician practitioner and as supervising physician I was immediately available for consultation/collaboration.  Sunnie Nielsen, MD 05/13/13 [phone]

## 2013-05-13 NOTE — ED Provider Notes (Signed)
Patient was a sign out at shift change from NP Earley Favor. Pt discharge pending finishing IV and PO potassium replacement. Pt tolerated both well. On my examination patient NAD, comfortably. Abdominal exam S/NT/ND with L CVA tenderness. Patient endorses that her pain is better. She is agreeable for discharge. Pt advised importance of filling the antibiotic and finishing course to completion. Patient is agreeable to plan. Patient is stable at time of discharge.     Medications  acetaminophen (TYLENOL) tablet 650 mg (650 mg Oral Given 05/13/13 0105)  sodium chloride 0.9 % bolus 500 mL (0 mLs Intravenous Stopped 05/13/13 0125)  0.9 %  sodium chloride infusion ( Intravenous Stopped 05/13/13 0746)  potassium chloride SA (K-DUR,KLOR-CON) CR tablet 40 mEq (40 mEq Oral Given 05/13/13 0251)  potassium chloride 10 mEq in 100 mL IVPB (0 mEq Intravenous Stopped 05/13/13 0745)  cefTRIAXone (ROCEPHIN) 1 g in dextrose 5 % 50 mL IVPB (0 g Intravenous Stopped 05/13/13 0315)     Lise Auer Andree Heeg, PA-C 05/13/13 1610

## 2013-05-13 NOTE — ED Notes (Signed)
Family at bedside. 

## 2013-05-14 LAB — URINE CULTURE

## 2013-05-15 ENCOUNTER — Telehealth (HOSPITAL_COMMUNITY): Payer: Self-pay | Admitting: Emergency Medicine

## 2013-05-15 NOTE — ED Notes (Signed)
Post ED Visit - Positive Culture Follow-up  Culture report reviewed by antimicrobial stewardship pharmacist: [x]  Wes Dulaney, Pharm.D., BCPS []  Celedonio Miyamoto, Pharm.D., BCPS []  Georgina Pillion, Pharm.D., BCPS []  [place], [address].D., BCPS, AAHIVP []  Estella Husk, Pharm.D., BCPS, AAHIVP  Positive urine culture Treated with cipro, organism sensitive to the same and no further patient follow-up is required at this time.  Ashley Jacobs 05/15/2013, 9:50 AM

## 2013-05-17 NOTE — ED Provider Notes (Signed)
Medical screening examination/treatment/procedure(s) were performed by non-physician practitioner and as supervising physician I was immediately available for consultation/collaboration.  Jack Bolio, MD 05/17/13 2241 

## 2013-07-01 ENCOUNTER — Ambulatory Visit: Payer: Medicaid Other

## 2014-03-31 ENCOUNTER — Emergency Department (HOSPITAL_COMMUNITY)
Admission: EM | Admit: 2014-03-31 | Discharge: 2014-03-31 | Disposition: A | Discharge status: Home / Self Care | Payer: Self-pay | Attending: Emergency Medicine | Admitting: Emergency Medicine

## 2014-03-31 ENCOUNTER — Emergency Department (HOSPITAL_COMMUNITY): Payer: Medicaid Other

## 2014-03-31 ENCOUNTER — Encounter (HOSPITAL_COMMUNITY): Payer: Self-pay | Admitting: Emergency Medicine

## 2014-03-31 ENCOUNTER — Emergency Department (HOSPITAL_COMMUNITY): Payer: Self-pay

## 2014-03-31 DIAGNOSIS — O034 Incomplete spontaneous abortion without complication: Secondary | ICD-10-CM | POA: Insufficient documentation

## 2014-03-31 DIAGNOSIS — O99330 Smoking (tobacco) complicating pregnancy, unspecified trimester: Secondary | ICD-10-CM | POA: Insufficient documentation

## 2014-03-31 DIAGNOSIS — R1084 Generalized abdominal pain: Secondary | ICD-10-CM | POA: Insufficient documentation

## 2014-03-31 DIAGNOSIS — O210 Mild hyperemesis gravidarum: Secondary | ICD-10-CM | POA: Insufficient documentation

## 2014-03-31 DIAGNOSIS — O9989 Other specified diseases and conditions complicating pregnancy, childbirth and the puerperium: Secondary | ICD-10-CM | POA: Insufficient documentation

## 2014-03-31 DIAGNOSIS — O4690 Antepartum hemorrhage, unspecified, unspecified trimester: Secondary | ICD-10-CM | POA: Insufficient documentation

## 2014-03-31 DIAGNOSIS — Z8719 Personal history of other diseases of the digestive system: Secondary | ICD-10-CM | POA: Insufficient documentation

## 2014-03-31 DIAGNOSIS — Z8619 Personal history of other infectious and parasitic diseases: Secondary | ICD-10-CM | POA: Insufficient documentation

## 2014-03-31 LAB — COMPREHENSIVE METABOLIC PANEL
ALT: 12 U/L (ref 0–35)
AST: 12 U/L (ref 0–37)
Albumin: 3.9 g/dL (ref 3.5–5.2)
Alkaline Phosphatase: 55 U/L (ref 39–117)
BUN: 9 mg/dL (ref 6–23)
CALCIUM: 9.2 mg/dL (ref 8.4–10.5)
CO2: 22 meq/L (ref 19–32)
CREATININE: 0.69 mg/dL (ref 0.50–1.10)
Chloride: 102 mEq/L (ref 96–112)
Glucose, Bld: 133 mg/dL — ABNORMAL HIGH (ref 70–99)
Potassium: 3.6 mEq/L — ABNORMAL LOW (ref 3.7–5.3)
Sodium: 137 mEq/L (ref 137–147)
TOTAL PROTEIN: 7 g/dL (ref 6.0–8.3)
Total Bilirubin: 0.2 mg/dL — ABNORMAL LOW (ref 0.3–1.2)

## 2014-03-31 LAB — CBC WITH DIFFERENTIAL/PLATELET
BASOS ABS: 0 10*3/uL (ref 0.0–0.1)
BASOS PCT: 0 % (ref 0–1)
EOS ABS: 0.1 10*3/uL (ref 0.0–0.7)
EOS PCT: 1 % (ref 0–5)
HEMATOCRIT: 35.1 % — AB (ref 36.0–46.0)
Hemoglobin: 12.3 g/dL (ref 12.0–15.0)
LYMPHS PCT: 12 % (ref 12–46)
Lymphs Abs: 1.5 10*3/uL (ref 0.7–4.0)
MCH: 30.7 pg (ref 26.0–34.0)
MCHC: 35 g/dL (ref 30.0–36.0)
MCV: 87.5 fL (ref 78.0–100.0)
MONO ABS: 0.8 10*3/uL (ref 0.1–1.0)
Monocytes Relative: 6 % (ref 3–12)
Neutro Abs: 10.9 10*3/uL — ABNORMAL HIGH (ref 1.7–7.7)
Neutrophils Relative %: 81 % — ABNORMAL HIGH (ref 43–77)
Platelets: 220 10*3/uL (ref 150–400)
RBC: 4.01 MIL/uL (ref 3.87–5.11)
RDW: 13.9 % (ref 11.5–15.5)
WBC: 13.2 10*3/uL — ABNORMAL HIGH (ref 4.0–10.5)

## 2014-03-31 LAB — ABO/RH: ABO/RH(D): O POS

## 2014-03-31 LAB — WET PREP, GENITAL
TRICH WET PREP: NONE SEEN
WBC, Wet Prep HPF POC: NONE SEEN
Yeast Wet Prep HPF POC: NONE SEEN

## 2014-03-31 LAB — HCG, QUANTITATIVE, PREGNANCY: hCG, Beta Chain, Quant, S: 8374 m[IU]/mL — ABNORMAL HIGH (ref <5)

## 2014-03-31 LAB — PREGNANCY, URINE: Preg Test, Ur: POSITIVE — AB

## 2014-03-31 MED ORDER — SODIUM CHLORIDE 0.9 % IV SOLN: Freq: Once | INTRAVENOUS | Status: DC

## 2014-03-31 MED ORDER — ACETAMINOPHEN 325 MG PO TABS
650.0000 mg | ORAL_TABLET | Freq: Once | ORAL | Status: AC
  Administered 2014-03-31: 650 mg via ORAL
  Filled 2014-03-31: qty 2

## 2014-03-31 NOTE — ED Notes (Signed)
Registration at the bedside.

## 2014-03-31 NOTE — ED Notes (Signed)
Phlebotomy at the bedside  

## 2014-03-31 NOTE — ED Notes (Signed)
Patient transported to Ultrasound 

## 2014-03-31 NOTE — ED Notes (Signed)
Tammi, PA at the bedside.

## 2014-03-31 NOTE — ED Provider Notes (Signed)
Ultrasound shows an incomplete abortion with a gestational sac within the cervix.  This could be a cervical ectopic pregnancy, as well.  She will not need a repeat ultrasound tomorrow.  She did term an exact diagnosis  Arman Filter, NP 03/31/14 2205

## 2014-03-31 NOTE — ED Notes (Signed)
Her period started yesterday and today her flow was excessive and she passed a large clot just pta.  Her lmp was the end of may and she usually has heavy periods lmp may end

## 2014-03-31 NOTE — ED Notes (Signed)
Patient returned from Ultrasound. 

## 2014-03-31 NOTE — ED Notes (Signed)
US Tech called and stated, "I have a few before the patient."

## 2014-03-31 NOTE — Discharge Instructions (Signed)
Incomplete Miscarriage °A miscarriage is the sudden loss of an unborn baby (fetus) before the 20th week of pregnancy. In an incomplete miscarriage, parts of the fetus or placenta (afterbirth) remain in the body.  °Having a miscarriage can be an emotional experience. Talk with your health care provider about any questions you may have about miscarrying, the grieving process, and your future pregnancy plans. °CAUSES  °· Problems with the fetal chromosomes that make it impossible for the baby to develop normally. Problems with the baby's genes or chromosomes are most often the result of errors that occur by chance as the embryo divides and grows. The problems are not inherited from the parents. °· Infection of the cervix or uterus. °· Hormone problems. °· Problems with the cervix, such as having an incompetent cervix. This is when the tissue in the cervix is not strong enough to hold the pregnancy. °· Problems with the uterus, such as an abnormally shaped uterus, uterine fibroids, or congenital abnormalities. °· Certain medical conditions. °· Smoking, drinking alcohol, or taking illegal drugs. °· Trauma. °SYMPTOMS  °· Vaginal bleeding or spotting, with or without cramps or pain. °· Pain or cramping in the abdomen or lower back. °· Passing fluid, tissue, or blood clots from the vagina. °DIAGNOSIS  °Your health care provider will perform a physical exam. You may also have an ultrasound to confirm the miscarriage. Blood or urine tests may also be ordered. °TREATMENT  °· Usually, a dilation and curettage (D&C) procedure is performed. During a D&C procedure, the cervix is widened (dilated) and any remaining fetal or placental tissue is gently removed from the uterus. °· Antibiotic medicines are prescribed if there is an infection. Other medicines may be given to reduce the size of the uterus (contract) if there is a lot of bleeding. °· If you have Rh negative blood and your baby was Rh positive, you will need an Rho(D)  immune globulin shot. This shot will protect any future baby from having Rh blood problems in future pregnancies. °· You may be confined to bed rest. This means you should stay in bed and only get up to use the bathroom. °HOME CARE INSTRUCTIONS  °· Rest as directed by your health care provider. °· Restrict activity as directed by your health care provider. You may be allowed to continue light activity if curettage was not done but you require further treatment. °· Keep track of the number of pads you use each day. Keep track of how soaked (saturated) they are. Record this information. °· Do not  use tampons. °· Do not douche or have sexual intercourse until approved by your health care provider. °· Keep all follow-up appointments for re-evaluation and continuing management. °· Only take over-the-counter or prescription medicines for pain, fever, or discomfort as directed by your health care provider. °· Take antibiotic medicine as directed by your health care provider. Make sure you finish it even if you start to feel better. °SEEK IMMEDIATE MEDICAL CARE IF:  °· You experience severe cramps in your stomach, back, or abdomen. °· You have an unexplained temperature (make sure to record these temperatures). °· You pass large clots or tissue (save these for your health care provider to inspect). °· Your bleeding increases. °· You become light-headed, weak, or have fainting episodes. °MAKE SURE YOU:  °· Understand these instructions. °· Will watch your condition. °· Will get help right away if you are not doing well or get worse. °Document Released: 10/15/2005 Document Revised: 08/05/2013 Document Reviewed: 05/14/2013 °  [employer] Patient Information 2014 [place], [place]. Your ultrasound, shows that there is a gestational sac in the cervix, which can be an incomplete abortion or a cervical ectopic pregnancy.  You will need to have a repeat ultrasound in the morning to determine this

## 2014-03-31 NOTE — ED Provider Notes (Signed)
CSN: [id number]     Arrival date & time 03/31/14  1550 History   First MD Initiated Contact with Patient 03/31/14 1702     Chief Complaint  Patient presents with  . Vaginal Bleeding     (Consider location/radiation/quality/duration/timing/severity/associated sxs/prior Treatment) HPI Comments: Sandra Foster is a 23 y.o. female who presents to the Emergency Department complaining of sudden onset of vaginal bleeding that began yesterday evening. She states that the bleeding began as mild and became excessive today. She also reports passage of several large blood clots and very heavy bleeding today associated with crampy lower abdominal pain. She states that she had a normal menstrual period at the end of May. She has bled through a tampon and a pad earlier today. Patient states that she does not use any sort of birth control at this time. Patient reports that she was concerned she may be pregnant and having a miscarriage.  Patient denies fever, chills, vomiting, dysuria, weakness or dizziness. Patient reports a previous miscarriage and a stillbirth. No history of ectopic pregnancies.  Nothing makes the symptoms better or worse  Patient is a 23 y.o. female presenting with vaginal bleeding. The history is provided by the patient.  Vaginal Bleeding Associated symptoms: abdominal pain and nausea   Associated symptoms: no back pain, no dizziness, no dysuria, no fever and no vaginal discharge     Past Medical History  Diagnosis Date  . Hernia   . No pertinent past medical history   . Chlamydia    Past Surgical History  Procedure Laterality Date  . Hernia repair     Family History  Problem Relation Age of Onset  . Hypertension Maternal Grandmother    History  Substance Use Topics  . Smoking status: Current Every Day Smoker -- 0.25 packs/day  . Smokeless tobacco: Former Neurosurgeon    Quit date: 06/10/2011  . Alcohol Use: No   OB History   Grav Para Term Preterm Abortions TAB SAB Ect Mult  Living   2 1 1  1 1     0     Review of Systems  Constitutional: Negative for fever, chills and appetite change.  Respiratory: Negative for shortness of breath.   Cardiovascular: Negative for chest pain.  Gastrointestinal: Positive for nausea, vomiting and abdominal pain. Negative for blood in stool and abdominal distention.  Genitourinary: Positive for vaginal bleeding and menstrual problem. Negative for dysuria, flank pain, decreased urine volume, vaginal discharge, difficulty urinating and vaginal pain.  Musculoskeletal: Negative for back pain.  Skin: Negative for color change and rash.  Neurological: Negative for dizziness, speech difficulty, weakness, light-headedness and numbness.  Hematological: Negative for adenopathy.  Psychiatric/Behavioral: Negative for confusion.  All other systems reviewed and are negative.     Allergies  Review of patient's allergies indicates no known allergies.  Home Medications   Prior to Admission medications   Medication Sig Start Date End Date Taking? Authorizing Provider  traMADol (ULTRAM) 50 MG tablet Take 50 mg by mouth every 6 (six) hours as needed for moderate pain.   Yes Historical Provider, MD   BP 102/50  Pulse 65  Temp(Src) 98 F (36.7 C) (Oral)  Resp 16  Ht 5\' 6"  (1.676 m)  Wt 161 lb (73.029 kg)  BMI 26.00 kg/m2  SpO2 98%  LMP 03/22/2014 Physical Exam  Nursing note and vitals reviewed. Constitutional: She is oriented to person, place, and time. She appears well-developed and well-nourished. No distress.  HENT:  Head: Normocephalic and atraumatic.  Mouth/Throat: Oropharynx is clear and moist.  Neck: Normal range of motion. Neck supple.  Cardiovascular: Normal rate, regular rhythm, normal heart sounds and intact distal pulses.   No murmur heard. Pulmonary/Chest: Effort normal and breath sounds normal. No respiratory distress. She exhibits no tenderness.  Abdominal: Soft. Normal appearance. She exhibits no distension and no  mass. There is tenderness. There is no rigidity, no rebound, no guarding, no CVA tenderness and no tenderness at McBurney's point.  Genitourinary: Uterus is tender. Right adnexum displays no mass, no tenderness and no fullness. Left adnexum displays no mass, no tenderness and no fullness. There is bleeding around the vagina. No erythema or tenderness around the vagina. No foreign body around the vagina. No vaginal discharge found.  Several small to medium sized clots and blood present within the vaginal vault. There is a large clot within the os.  No adnexal masses or tenderness   Musculoskeletal: Normal range of motion.  Lymphadenopathy:       Right: No inguinal adenopathy present.       Left: No inguinal adenopathy present.  Neurological: She is alert and oriented to person, place, and time. She exhibits normal muscle tone. Coordination normal.  Skin: Skin is warm and dry.    ED Course  Procedures (including critical care time) Labs Review Labs Reviewed  WET PREP, GENITAL - Abnormal; Notable for the following:    Clue Cells Wet Prep HPF POC FEW (*)    All other components within normal limits  CBC WITH DIFFERENTIAL - Abnormal; Notable for the following:    WBC 13.2 (*)    HCT 35.1 (*)    Neutrophils Relative % 81 (*)    Neutro Abs 10.9 (*)    All other components within normal limits  COMPREHENSIVE METABOLIC PANEL - Abnormal; Notable for the following:    Potassium 3.6 (*)    Glucose, Bld 133 (*)    Total Bilirubin 0.2 (*)    All other components within normal limits  PREGNANCY, URINE - Abnormal; Notable for the following:    Preg Test, Ur POSITIVE (*)    All other components within normal limits  HCG, QUANTITATIVE, PREGNANCY - Abnormal; Notable for the following:    hCG, Beta Chain, Quant, S 8374 (*)    All other components within normal limits  GC/CHLAMYDIA PROBE AMP  RPR  HIV ANTIBODY (ROUTINE TESTING)  ABO/RH    Imaging Review No results found.   EKG  Interpretation None      MDM   Final diagnoses:  None    Patient is well appearing and nontoxic. Vital signs stable. Diffuse lower abdominal pain on exam. Tylenol for pain.  Patient history and exam findings were concerning for a miscarriage  Have discussed lab results with the patient, she was unaware that she's pregnant.  Quant and ultrasound ordered.  End of shift,  Ultrasound results are still pending, pt discussed and signed out to Earley FavorGail Schulz, NP  Sage Hammill L. Trisha Mangleriplett, PA-C 03/31/14 2052

## 2014-03-31 NOTE — ED Notes (Signed)
Spoke with Tammi, PA about starting IV and giving fluids. PA stated to "hold off on the fluids for now."

## 2014-04-01 ENCOUNTER — Inpatient Hospital Stay (HOSPITAL_COMMUNITY)
Admission: AD | Admit: 2014-04-01 | Discharge: 2014-04-01 | Disposition: A | Discharge status: Home / Self Care | Payer: Medicaid Other | Source: Ambulatory Visit | Attending: Obstetrics and Gynecology | Admitting: Obstetrics and Gynecology

## 2014-04-01 ENCOUNTER — Encounter (HOSPITAL_COMMUNITY): Payer: Self-pay

## 2014-04-01 DIAGNOSIS — O209 Hemorrhage in early pregnancy, unspecified: Secondary | ICD-10-CM

## 2014-04-01 DIAGNOSIS — O039 Complete or unspecified spontaneous abortion without complication: Secondary | ICD-10-CM | POA: Insufficient documentation

## 2014-04-01 DIAGNOSIS — R109 Unspecified abdominal pain: Secondary | ICD-10-CM | POA: Insufficient documentation

## 2014-04-01 DIAGNOSIS — O008 Other ectopic pregnancy without intrauterine pregnancy: Secondary | ICD-10-CM | POA: Insufficient documentation

## 2014-04-01 HISTORY — DX: Reserved for concepts with insufficient information to code with codable children: IMO0002

## 2014-04-01 HISTORY — DX: Tubulo-interstitial nephritis, not specified as acute or chronic: N12

## 2014-04-01 HISTORY — DX: Gestational (pregnancy-induced) hypertension without significant proteinuria, unspecified trimester: O13.9

## 2014-04-01 LAB — HIV ANTIBODY (ROUTINE TESTING W REFLEX): HIV 1&2 Ab, 4th Generation: NONREACTIVE

## 2014-04-01 LAB — URINALYSIS, ROUTINE W REFLEX MICROSCOPIC
Bilirubin Urine: NEGATIVE
Glucose, UA: NEGATIVE mg/dL
Ketones, ur: NEGATIVE mg/dL
LEUKOCYTES UA: NEGATIVE
Nitrite: NEGATIVE
PROTEIN: 30 mg/dL — AB
Specific Gravity, Urine: 1.02 (ref 1.005–1.030)
UROBILINOGEN UA: 2 mg/dL — AB (ref 0.0–1.0)
pH: 6.5 (ref 5.0–8.0)

## 2014-04-01 LAB — URINE MICROSCOPIC-ADD ON

## 2014-04-01 LAB — RPR

## 2014-04-01 LAB — GC/CHLAMYDIA PROBE AMP
CT PROBE, AMP APTIMA: NEGATIVE
GC Probe RNA: NEGATIVE

## 2014-04-01 MED ORDER — OXYCODONE-ACETAMINOPHEN 5-325 MG PO TABS: 1.0000 | ORAL_TABLET | Freq: Four times a day (QID) | ORAL | Status: DC | PRN

## 2014-04-01 NOTE — MAU Note (Signed)
Patient states she was seen at [facility] ED yesterday. Was referred to come to MAU today for a repeat ultrasound. A question of SAB vs cervical ectopic. Patient state she is having heavy bleeding and abdominal cramping.

## 2014-04-01 NOTE — Discharge Instructions (Signed)
Pelvic Rest °Pelvic rest is sometimes recommended for women when:  °· The placenta is partially or completely covering the opening of the cervix (placenta previa). °· There is bleeding between the uterine wall and the amniotic sac in the first trimester (subchorionic hemorrhage). °· The cervix begins to open without labor starting (incompetent cervix, cervical insufficiency). °· The labor is too early (preterm labor). °HOME CARE INSTRUCTIONS °· Do not have sexual intercourse, stimulation, or an orgasm. °· Do not use tampons, douche, or put anything in the vagina. °· Do not lift anything over 10 pounds (4.5 kg). °· Avoid strenuous activity or straining your pelvic muscles. °SEEK MEDICAL CARE IF:  °· You have any vaginal bleeding during pregnancy. Treat this as a potential emergency. °· You have cramping pain felt low in the stomach (stronger than menstrual cramps). °· You notice vaginal discharge (watery, mucus, or bloody). °· You have a low, dull backache. °· There are regular contractions or uterine tightening. °SEEK IMMEDIATE MEDICAL CARE IF: °You have vaginal bleeding and have placenta previa.  °Document Released: 02/09/2011 Document Revised: 01/07/2012 Document Reviewed: 02/09/2011 °ExitCare® Patient Information ©2014 [employer]. ° °Threatened Miscarriage ° A threatened miscarriage is a pregnancy that may end. It may be marked by bleeding during the first 20 weeks of pregnancy. Often, the pregnancy can continue without any more problems. You may be asked to stop: °· Having sex (intercourse). °· Having orgasms. °· Using tampons. °· Exercising. °· Doing heavy physical activity and work. °HOME CARE  °· Your doctor may tell you to take bed rest and to stop activities and work. °· Write down the number of pads you use each day. Write down how often you change pads. Write down how soaked they are. °· Follow your doctor's advice for follow-up visits and tests. °· If your blood type is Rh-negative and the father's  blood is Rh-positive (or is not known), you may get a shot to protect the baby. °· If you have a miscarriage, save all the tissue you pass in a container. Take the container to your doctor. °GET HELP RIGHT AWAY IF:  °· You have bad cramps or pain in your belly (abdomen), lower belly, or back. °· You have a fever or chills. °· Your bleeding gets worse or you pass large clots of blood or tissue. Save this tissue to show your doctor. °· You feel lightheaded, weak, dizzy, or pass out (faint). °· You have a gush of fluid from your vagina. °MAKE SURE YOU:  °· Understand these instructions. °· Will watch your condition. °· Will get help right away if you are not doing well or get worse. °Document Released: 09/27/2008 Document Revised: 01/07/2012 Document Reviewed: 10/31/2009 °ExitCare® Patient Information ©2014 [employer]. ° °

## 2014-04-01 NOTE — MAU Provider Note (Signed)
Attestation of Attending Supervision of Advanced Practitioner (CNM/NP): Evaluation and management procedures were performed by the Advanced Practitioner under my supervision and collaboration.  I have reviewed the Advanced Practitioner's note and chart, and I agree with the management and plan.  Dakiya Puopolo 04/01/2014 8:40 PM

## 2014-04-01 NOTE — MAU Provider Note (Signed)
History     CSN: [id number]  Arrival date and time: 04/01/14 1145   First Provider Initiated Contact with Patient 04/01/14 1234      Chief Complaint  Patient presents with  . Abdominal Pain  . Vaginal Bleeding   HPI Ms. Sandra Foster is a 23 y.o. G3P1010 at 131w5d who presents to MAU today with complaint of vaginal bleeding. [facility] for vaginal bleeding and abdominal pain in pregnancy. She had US that showed IUGS in the cervix vs cervical ectopic pregnancy. She states that she has continued to bleed heavy, although only wore one large pad over night. She is having lower abdominal cramps that she rates at 9/10. She appears comfortable. She denies a change in pain or bleeding. She denies fever. She has had nausea without vomiting. She was told that if she passed large clots she should bring them with her here. She has brought something with her that she feels may be tissue.   OB History   Grav Para Term Preterm Abortions TAB SAB Ect Mult Living   3 1 1  1 1     0      Past Medical History  Diagnosis Date  . Hernia   . No pertinent past medical history   . Chlamydia   . Pregnancy induced hypertension   . Pyelonephritis   . Fetal demise     Past Surgical History  Procedure Laterality Date  . Hernia repair      Family History  Problem Relation Age of Onset  . Hypertension Maternal Grandmother     History  Substance Use Topics  . Smoking status: Current Every Day Smoker -- 0.25 packs/day  . Smokeless tobacco: Former NeurosurgeonUser    Quit date: 06/10/2011  . Alcohol Use: No    Allergies: No Known Allergies  Prescriptions prior to admission  Medication Sig Dispense Refill  . traMADol (ULTRAM) 50 MG tablet Take 50 mg by mouth every 6 (six) hours as needed for moderate pain.        Review of Systems  Constitutional: Negative for fever and malaise/fatigue.  Gastrointestinal: Positive for nausea and abdominal pain. Negative for vomiting, diarrhea  and constipation.  Genitourinary: Negative for dysuria, urgency and frequency.       + vaginal bleeding  Neurological: Negative for dizziness.   Physical Exam   Blood pressure 109/58, pulse 62, temperature 99.2 F (37.3 C), temperature source Oral, resp. rate 16, height 5\' 4"  (1.626 m), weight 159 lb 12.8 oz (72.485 kg), last menstrual period 03/20/2014, SpO2 100.00%.  Physical Exam  Constitutional: She is oriented to person, place, and time. She appears well-developed and well-nourished. No distress.  HENT:  Head: Normocephalic.  Cardiovascular: Normal rate.   Respiratory: Effort normal.  GI: Soft. She exhibits no distension and no mass. There is tenderness (mild tenderness to palpation of the lower abdomen bilaterally). There is no rebound and no guarding.  Genitourinary: Uterus is tender (mild). Uterus is not enlarged. Cervix exhibits no motion tenderness, no discharge and no friability. Right adnexum displays no mass and no tenderness. Left adnexum displays no mass and no tenderness. There is bleeding (scant blood in vaginal vault) around the vagina.  Neurological: She is alert and oriented to person, place, and time.  Skin: Skin is warm and dry. No erythema.  Psychiatric: She has a normal mood and affect.   Results for orders placed during the hospital encounter of 03/31/14 (from the past 24 hour(s))  CBC WITH DIFFERENTIAL     Status: Abnormal   Collection Time    03/31/14  4:06 PM      Result Value Ref Range   WBC 13.2 (*) 4.0 - 10.5 K/uL   RBC 4.01  3.87 - 5.11 MIL/uL   Hemoglobin 12.3  12.0 - 15.0 g/dL   HCT 27.0 (*) 78.6 - 75.4 %   MCV 87.5  78.0 - 100.0 fL   MCH 30.7  26.0 - 34.0 pg   MCHC 35.0  30.0 - 36.0 g/dL   RDW 49.2  01.0 - 07.1 %   Platelets 220  150 - 400 K/uL   Neutrophils Relative % 81 (*) 43 - 77 %   Neutro Abs 10.9 (*) 1.7 - 7.7 K/uL   Lymphocytes Relative 12  12 - 46 %   Lymphs Abs 1.5  0.7 - 4.0 K/uL   Monocytes Relative 6  3 - 12 %   Monocytes  Absolute 0.8  0.1 - 1.0 K/uL   Eosinophils Relative 1  0 - 5 %   Eosinophils Absolute 0.1  0.0 - 0.7 K/uL   Basophils Relative 0  0 - 1 %   Basophils Absolute 0.0  0.0 - 0.1 K/uL  COMPREHENSIVE METABOLIC PANEL     Status: Abnormal   Collection Time    03/31/14  4:06 PM      Result Value Ref Range   Sodium 137  137 - 147 mEq/L   Potassium 3.6 (*) 3.7 - 5.3 mEq/L   Chloride 102  96 - 112 mEq/L   CO2 22  19 - 32 mEq/L   Glucose, Bld 133 (*) 70 - 99 mg/dL   BUN 9  6 - 23 mg/dL   Creatinine, Ser 2.19  0.50 - 1.10 mg/dL   Calcium 9.2  8.4 - 75.8 mg/dL   Total Protein 7.0  6.0 - 8.3 g/dL   Albumin 3.9  3.5 - 5.2 g/dL   AST 12  0 - 37 U/L   ALT 12  0 - 35 U/L   Alkaline Phosphatase 55  39 - 117 U/L   Total Bilirubin 0.2 (*) 0.3 - 1.2 mg/dL   GFR calc non Af Amer >90  >90 mL/min   GFR calc Af Amer >90  >90 mL/min  PREGNANCY, URINE     Status: Abnormal   Collection Time    03/31/14  4:22 PM      Result Value Ref Range   Preg Test, Ur POSITIVE (*) NEGATIVE  ABO/RH     Status: None   Collection Time    03/31/14  6:42 PM      Result Value Ref Range   ABO/RH(D) O POS     No rh immune globuloin NOT A RH IMMUNE GLOBULIN CANDIDATE, PT RH POSITIVE    HCG, QUANTITATIVE, PREGNANCY     Status: Abnormal   Collection Time    03/31/14  6:42 PM      Result Value Ref Range   hCG, Beta Chain, Quant, S 8374 (*) <5 mIU/mL  RPR     Status: None   Collection Time    03/31/14  6:42 PM      Result Value Ref Range   RPR NON REAC  NON REAC  HIV ANTIBODY (ROUTINE TESTING)     Status: None   Collection Time    03/31/14  6:42 PM      Result Value Ref Range   HIV 1&2 Ab, 4th Generation NONREACTIVE  NONREACTIVE  GC/CHLAMYDIA PROBE AMP     Status: None   Collection Time    03/31/14  7:05 PM      Result Value Ref Range   CT Probe RNA NEGATIVE  NEGATIVE   GC Probe RNA NEGATIVE  NEGATIVE  WET PREP, GENITAL     Status: Abnormal   Collection Time    03/31/14  7:06 PM      Result Value Ref Range    Yeast Wet Prep HPF POC NONE SEEN  NONE SEEN   Trich, Wet Prep NONE SEEN  NONE SEEN   Clue Cells Wet Prep HPF POC FEW (*) NONE SEEN   WBC, Wet Prep HPF POC NONE SEEN  NONE SEEN   US Ob Comp Less 14 Wks  03/31/2014   CLINICAL DATA:  Bleeding and pregnancy.  Beta HCG 8,374.  EXAM: OBSTETRIC <14 WK [place] AND TRANSVAGINAL OB US  TECHNIQUE: Both transabdominal and transvaginal ultrasound examinations were performed for complete evaluation of the gestation as well as the maternal uterus, adnexal regions, and pelvic cul-de-sac. Transvaginal technique was performed to assess early pregnancy.  COMPARISON:  10/25/2011  FINDINGS: There is a sac-like structure located in the cervix with internal echoes. The sac measures 14 mm. There are no definitive internal fetal contents. No gestational sac seen within the uterine endometrial cavity. Symmetric and normal appearing ovaries. No free pelvic fluid. No adnexal mass.  IMPRESSION: 1. No normal intrauterine gestation identified. 2. Sac like structure in the cervix, possibly abortion in progress. Recommend short follow-up sonography to ensure findings do not represent cervical ectopic or occult ectopic with incidental endocervical fluid.   Electronically Signed   By: Tiburcio Pea M.D.   On: 03/31/2014 21:57   US Ob Transvaginal  03/31/2014   CLINICAL DATA:  Bleeding and pregnancy.  Beta HCG 8,374.  EXAM: OBSTETRIC <14 WK [place] AND TRANSVAGINAL OB US  TECHNIQUE: Both transabdominal and transvaginal ultrasound examinations were performed for complete evaluation of the gestation as well as the maternal uterus, adnexal regions, and pelvic cul-de-sac. Transvaginal technique was performed to assess early pregnancy.  COMPARISON:  10/25/2011  FINDINGS: There is a sac-like structure located in the cervix with internal echoes. The sac measures 14 mm. There are no definitive internal fetal contents. No gestational sac seen within the uterine endometrial cavity. Symmetric and normal appearing  ovaries. No free pelvic fluid. No adnexal mass.  IMPRESSION: 1. No normal intrauterine gestation identified. 2. Sac like structure in the cervix, possibly abortion in progress. Recommend short follow-up sonography to ensure findings do not represent cervical ectopic or occult ectopic with incidental endocervical fluid.   Electronically Signed   By: Tiburcio Pea M.D.   On: 03/31/2014 21:57      MAU Course  Procedures None  MDM Discussed with Dr. Jolayne Panther. [place] follow-up in 1 week.   Assessment and Plan  A: SAB vs cervical ectopic pregnancy  P: Discharge home Rx for Percocet given to patient Bleeding/ectopic precautions discussed Patient scheduled for follow-up US in 1 week Patient may return to MAU as needed or if her condition were to change or worsen  Freddi Starr, PA-C  04/01/2014, 12:34 PM

## 2014-04-04 NOTE — ED Provider Notes (Signed)
Medical screening examination/treatment/procedure(s) were performed by non-physician practitioner and as supervising physician I was immediately available for consultation/collaboration.   EKG Interpretation None        Dontrelle Mazon, MD 04/04/14 1139 

## 2014-04-04 NOTE — ED Provider Notes (Signed)
Medical screening examination/treatment/procedure(s) were performed by non-physician practitioner and as supervising physician I was immediately available for consultation/collaboration.   EKG Interpretation None        Concetta Guion, MD 04/04/14 1139 

## 2014-04-08 ENCOUNTER — Ambulatory Visit (HOSPITAL_COMMUNITY)
Admit: 2014-04-08 | Discharge: 2014-04-08 | Disposition: A | Discharge status: Home / Self Care | Payer: Medicaid Other | Attending: Medical | Admitting: Medical

## 2014-04-08 DIAGNOSIS — Z3689 Encounter for other specified antenatal screening: Secondary | ICD-10-CM | POA: Insufficient documentation

## 2014-04-08 DIAGNOSIS — O209 Hemorrhage in early pregnancy, unspecified: Secondary | ICD-10-CM | POA: Insufficient documentation

## 2014-04-22 ENCOUNTER — Encounter: Payer: Self-pay | Admitting: Obstetrics & Gynecology

## 2014-04-22 ENCOUNTER — Encounter: Payer: Self-pay | Admitting: General Practice

## 2014-04-22 ENCOUNTER — Telehealth: Payer: Self-pay | Admitting: General Practice

## 2014-04-22 NOTE — Telephone Encounter (Signed)
Patient no showed for appt today. Called patient, no answer- left message that we are calling because it appears you missed your appt with [place] today, please contact our front office staff to reschedule this appt. Will send letter

## 2014-04-26 ENCOUNTER — Encounter: Payer: Medicaid Other | Admitting: Obstetrics & Gynecology

## 2014-04-26 ENCOUNTER — Telehealth: Payer: Self-pay | Admitting: *Deleted

## 2014-04-26 NOTE — Telephone Encounter (Signed)
Contacted patient and discussed missed appointment, she states that a family member was in a car wreck and she was unable to keep appointment.  Pt will call clinic and reschedule.

## 2014-07-04 ENCOUNTER — Encounter (HOSPITAL_COMMUNITY): Payer: Self-pay | Admitting: Emergency Medicine

## 2014-07-04 DIAGNOSIS — R42 Dizziness and giddiness: Secondary | ICD-10-CM | POA: Insufficient documentation

## 2014-07-04 DIAGNOSIS — R5383 Other fatigue: Secondary | ICD-10-CM

## 2014-07-04 DIAGNOSIS — R5381 Other malaise: Secondary | ICD-10-CM | POA: Insufficient documentation

## 2014-07-04 DIAGNOSIS — F17200 Nicotine dependence, unspecified, uncomplicated: Secondary | ICD-10-CM | POA: Insufficient documentation

## 2014-07-04 DIAGNOSIS — Z3202 Encounter for pregnancy test, result negative: Secondary | ICD-10-CM | POA: Insufficient documentation

## 2014-07-04 DIAGNOSIS — R51 Headache: Secondary | ICD-10-CM | POA: Insufficient documentation

## 2014-07-04 LAB — URINALYSIS, ROUTINE W REFLEX MICROSCOPIC
Bilirubin Urine: NEGATIVE
GLUCOSE, UA: NEGATIVE mg/dL
HGB URINE DIPSTICK: NEGATIVE
Ketones, ur: NEGATIVE mg/dL
LEUKOCYTES UA: NEGATIVE
Nitrite: NEGATIVE
PH: 7 (ref 5.0–8.0)
Protein, ur: NEGATIVE mg/dL
Specific Gravity, Urine: 1.02 (ref 1.005–1.030)
Urobilinogen, UA: 1 mg/dL (ref 0.0–1.0)

## 2014-07-04 LAB — CBC WITH DIFFERENTIAL/PLATELET
Basophils Absolute: 0 10*3/uL (ref 0.0–0.1)
Basophils Relative: 1 % (ref 0–1)
Eosinophils Absolute: 0.1 10*3/uL (ref 0.0–0.7)
Eosinophils Relative: 1 % (ref 0–5)
HCT: 39.1 % (ref 36.0–46.0)
HEMOGLOBIN: 13.2 g/dL (ref 12.0–15.0)
LYMPHS PCT: 31 % (ref 12–46)
Lymphs Abs: 2.6 10*3/uL (ref 0.7–4.0)
MCH: 29.1 pg (ref 26.0–34.0)
MCHC: 33.8 g/dL (ref 30.0–36.0)
MCV: 86.3 fL (ref 78.0–100.0)
MONOS PCT: 7 % (ref 3–12)
Monocytes Absolute: 0.6 10*3/uL (ref 0.1–1.0)
Neutro Abs: 5 10*3/uL (ref 1.7–7.7)
Neutrophils Relative %: 60 % (ref 43–77)
PLATELETS: 278 10*3/uL (ref 150–400)
RBC: 4.53 MIL/uL (ref 3.87–5.11)
RDW: 13.8 % (ref 11.5–15.5)
WBC: 8.3 10*3/uL (ref 4.0–10.5)

## 2014-07-04 LAB — BASIC METABOLIC PANEL
Anion gap: 14 (ref 5–15)
BUN: 16 mg/dL (ref 6–23)
CO2: 23 meq/L (ref 19–32)
Calcium: 9.3 mg/dL (ref 8.4–10.5)
Chloride: 105 mEq/L (ref 96–112)
Creatinine, Ser: 0.84 mg/dL (ref 0.50–1.10)
GFR calc Af Amer: >90 mL/min (ref >90)
GFR calc non Af Amer: >90 mL/min (ref >90)
Glucose, Bld: 95 mg/dL (ref 70–99)
POTASSIUM: 4 meq/L (ref 3.7–5.3)
SODIUM: 142 meq/L (ref 137–147)

## 2014-07-04 LAB — CBG MONITORING, ED: Glucose-Capillary: 107 mg/dL — ABNORMAL HIGH (ref 70–99)

## 2014-07-04 LAB — POC URINE PREG, ED: PREG TEST UR: NEGATIVE

## 2014-07-04 NOTE — ED Notes (Signed)
States she feels the same. Watching TV with family member

## 2014-07-04 NOTE — ED Notes (Signed)
Patient presents with complaint of dizziness, lightheadedness, headache, and fatigue. States that she had gestational diabetes 2.5 years ago and felt the same way. Was discharged without medications to manage issue as they said she "was okay after the pregnancy". CBG 107 here. Denies other symptoms, but reports a knot showed up on her left hip a few nights ago and went away.

## 2014-07-05 ENCOUNTER — Encounter (HOSPITAL_COMMUNITY): Payer: Self-pay | Admitting: Emergency Medicine

## 2014-07-05 ENCOUNTER — Emergency Department (HOSPITAL_COMMUNITY)
Admission: EM | Admit: 2014-07-05 | Discharge: 2014-07-05 | Disposition: A | Discharge status: Home / Self Care | Payer: Medicaid Other | Attending: Emergency Medicine | Admitting: Emergency Medicine

## 2014-07-05 ENCOUNTER — Emergency Department (HOSPITAL_COMMUNITY)
Admission: EM | Admit: 2014-07-05 | Discharge: 2014-07-05 | Discharge status: Against Medical Advice | Payer: Medicaid Other | Attending: Emergency Medicine | Admitting: Emergency Medicine

## 2014-07-05 DIAGNOSIS — R5381 Other malaise: Secondary | ICD-10-CM | POA: Insufficient documentation

## 2014-07-05 DIAGNOSIS — R531 Weakness: Secondary | ICD-10-CM

## 2014-07-05 DIAGNOSIS — R5383 Other fatigue: Secondary | ICD-10-CM

## 2014-07-05 DIAGNOSIS — R519 Headache, unspecified: Secondary | ICD-10-CM

## 2014-07-05 DIAGNOSIS — R51 Headache: Secondary | ICD-10-CM | POA: Insufficient documentation

## 2014-07-05 DIAGNOSIS — F17200 Nicotine dependence, unspecified, uncomplicated: Secondary | ICD-10-CM | POA: Insufficient documentation

## 2014-07-05 DIAGNOSIS — Z8632 Personal history of gestational diabetes: Secondary | ICD-10-CM | POA: Insufficient documentation

## 2014-07-05 DIAGNOSIS — Z8719 Personal history of other diseases of the digestive system: Secondary | ICD-10-CM | POA: Insufficient documentation

## 2014-07-05 DIAGNOSIS — Z8619 Personal history of other infectious and parasitic diseases: Secondary | ICD-10-CM | POA: Insufficient documentation

## 2014-07-05 DIAGNOSIS — Z87448 Personal history of other diseases of urinary system: Secondary | ICD-10-CM | POA: Insufficient documentation

## 2014-07-05 MED ORDER — KETOROLAC TROMETHAMINE 60 MG/2ML IM SOLN
60.0000 mg | Freq: Once | INTRAMUSCULAR | Status: AC
  Administered 2014-07-05: 60 mg via INTRAMUSCULAR
  Filled 2014-07-05: qty 2

## 2014-07-05 NOTE — ED Notes (Signed)
No answer in waiting area.

## 2014-07-05 NOTE — Discharge Instructions (Signed)
Follow up with primary care doctor if continue to have pain and symptoms. Try to make sure to stay hydrated, get plenty of rest, eat well balanced diet. Return if any new concerning symptoms.     Fatigue Fatigue is a feeling of tiredness, lack of energy, lack of motivation, or feeling tired all the time. Having enough rest, good nutrition, and reducing stress will normally reduce fatigue. Consult your caregiver if it persists. The nature of your fatigue will help your caregiver to find out its cause. The treatment is based on the cause.  CAUSES  There are many causes for fatigue. Most of the time, fatigue can be traced to one or more of your habits or routines. Most causes fit into one or more of three general areas. They are: Lifestyle problems  Sleep disturbances.  Overwork.  Physical exertion.  Unhealthy habits.  Poor eating habits or eating disorders.  Alcohol and/or drug use .  Lack of proper nutrition (malnutrition). Psychological problems  Stress and/or anxiety problems.  Depression.  Grief.  Boredom. Medical Problems or Conditions  Anemia.  Pregnancy.  Thyroid gland problems.  Recovery from major surgery.  Continuous pain.  Emphysema or asthma that is not well controlled  Allergic conditions.  Diabetes.  Infections (such as mononucleosis).  Obesity.  Sleep disorders, such as sleep apnea.  Heart failure or other heart-related problems.  Cancer.  Kidney disease.  Liver disease.  Effects of certain medicines such as antihistamines, cough and cold remedies, prescription pain medicines, heart and blood pressure medicines, drugs used for treatment of cancer, and some antidepressants. SYMPTOMS  The symptoms of fatigue include:   Lack of energy.  Lack of drive (motivation).  Drowsiness.  Feeling of indifference to the surroundings. DIAGNOSIS  The details of how you feel help guide your caregiver in finding out what is causing the fatigue.  You will be asked about your present and past health condition. It is important to review all medicines that you take, including prescription and non-prescription items. A thorough exam will be done. You will be questioned about your feelings, habits, and normal lifestyle. Your caregiver may suggest blood tests, urine tests, or other tests to look for common medical causes of fatigue.  TREATMENT  Fatigue is treated by correcting the underlying cause. For example, if you have continuous pain or depression, treating these causes will improve how you feel. Similarly, adjusting the dose of certain medicines will help in reducing fatigue.  HOME CARE INSTRUCTIONS   Try to get the required amount of good sleep every night.  Eat a healthy and nutritious diet, and drink enough water throughout the day.  Practice ways of relaxing (including yoga or meditation).  Exercise regularly.  Make plans to change situations that cause stress. Act on those plans so that stresses decrease over time. Keep your work and personal routine reasonable.  Avoid street drugs and minimize use of alcohol.  Start taking a daily multivitamin after consulting your caregiver. SEEK MEDICAL CARE IF:   You have persistent tiredness, which cannot be accounted for.  You have fever.  You have unintentional weight loss.  You have headaches.  You have disturbed sleep throughout the night.  You are feeling sad.  You have constipation.  You have dry skin.  You have gained weight.  You are taking any new or different medicines that you suspect are causing fatigue.  You are unable to sleep at night.  You develop any unusual swelling of your legs or other parts  of your body. SEEK IMMEDIATE MEDICAL CARE IF:   You are feeling confused.  Your vision is blurred.  You feel faint or pass out.  You develop severe headache.  You develop severe abdominal, pelvic, or back pain.  You develop chest pain, shortness of  breath, or an irregular or fast heartbeat.  You are unable to pass a normal amount of urine.  You develop abnormal bleeding such as bleeding from the rectum or you vomit blood.  You have thoughts about harming yourself or committing suicide.  You are worried that you might harm someone else. MAKE SURE YOU:   Understand these instructions.  Will watch your condition.  Will get help right away if you are not doing well or get worse. Document Released: 08/12/2007 Document Revised: 01/07/2012 Document Reviewed: 02/16/2014 [facility] Patient Information 2015 [place], [place]. This information is not intended to replace advice given to you by your health care provider. Make sure you discuss any questions you have with your health care provider.   Emergency Department Resource Guide 1) Find a Doctor and Pay Out of Pocket Although you won't have to find out who is covered by your insurance plan, it is a good idea to ask around and get recommendations. You will then need to call the office and see if the doctor you have chosen will accept you as a new patient and what types of options they offer for patients who are self-pay. Some doctors offer discounts or will set up payment plans for their patients who do not have insurance, but you will need to ask so you aren't surprised when you get to your appointment.  2) Contact Your Local Health Department Not all health departments have doctors that can see patients for sick visits, but many do, so it is worth a call to see if yours does. If you don't know where your local health department is, you can check in your phone book. The CDC also has a tool to help you locate your state's health department, and many state websites also have listings of all of their local health departments.  3) Find a Walk-in Clinic If your illness is not likely to be very severe or complicated, you may want to try a walk in clinic. These are popping up all over the country in  pharmacies, drugstores, and shopping centers. They're usually staffed by nurse practitioners or physician assistants that have been trained to treat common illnesses and complaints. They're usually fairly quick and inexpensive. However, if you have serious medical issues or chronic medical problems, these are probably not your best option.  No Primary Care Doctor: - Call Health Connect at  [phone] - they can help you locate a primary care doctor that  accepts your insurance, provides certain services, etc. - Physician Referral Service- [phone]  Chronic Pain Problems: Organization         Address  Phone   Notes  [facility]  [phone] Patients need to be referred by their primary care doctor.   Medication Assistance: Organization         Address  Phone   Notes  [facility] Medication [facility] [address] [place] [place], [place] [address] [phone] --Must be a resident of [place] [facility] -- Must have NO insurance coverage whatsoever (no Medicaid/ Medicare, etc.) -- The pt. MUST have a primary care doctor that directs their care regularly and follows them in the community   Med[facility]ist  [phone]  [employer]  [phone]    Agencies that provide inexpensive medical care: Organization         Address  Phone   Notes  [place]  [phone]   [facility]    [phone]   [facility] [place], [place] [address] [phone]   [place] [address], [place] [phone]   Planned Parenthood    [phone]   [facility]    [phone]   [place] and [place] [address], [place] Phone:  [phone], Fax:  [phone] Hours of Operation:  9 am - 6 pm, M-F.  Also accepts Medicaid/Medicare and self-pay.  [facility] for [place], [address],  [place] Phone: [phone], Fax: [phone]. Hours of Operation:  8:30 am - 5:30 pm, M-F.  Also accepts Medicaid and self-pay.  [facility] [place] [address] [place] Phone: [phone]   [place], [place], [place] [phone], Ext. [phone] Mondays & Thursdays: 7-9 AM.  First 15 patients are seen on a first come, first serve basis.    [facility] Providers:  Organization         Address  Phone   Notes  [facility] [address], [place] [phone] Also accepts self-pay patients.  [facility] [id number] [place], [place]  [phone]   [place], [address], [place] [phone]   [facility] [address] [place] [phone]   Lucianne Lei [address], [place]   [phone] Only accepts [place] Access [place] patients after they have their name applied to their card.   Self-Pay (no insurance) in [facility]:  Organization         Address  Phone   Notes  Sickle Cell Patients, [facility] [place] [phone]   [facility] [place] [phone]   [facility] [place]  [place], [place], [place] [phone]   [facility]/Dr. Osei-Bonsu  [address], [place] or [address], [place] [phone] Phone number for both [place] and [place] locations is the same.  Urgent Medical and [facility] [address], [place] [phone]   [facility] [address], [place] or [address] [phone]   [facility] [address], [place] [phone], phone; [phone], fax Sees patients 1st and 3rd Saturday of every month.  Must not  qualify for public or private insurance (i.e. Medicaid, Medicare, [place] Health Choice, Veterans' Benefits)  Household income should be no more than 200% of the poverty level The clinic cannot treat you if you are pregnant or think you are pregnant  Sexually transmitted diseases are not treated at the clinic.    Dental Care: Organization         Address  Phone  Notes  [facility] Department of [facility] [phone] Accepts children up to age 44 who are enrolled in [place] or [place]; pregnant women with a Medicaid card;  and children who have applied for Medicaid or [place] Health Choice, but were declined, whose parents can pay a reduced fee at time of service.  [facility] Department of [facility]  [address], [place] [phone] Accepts children up to age 50 who are enrolled in [place] or [place]; pregnant women with a Medicaid card; and children who have applied for Medicaid or [place] Health Choice, but were declined, whose parents can pay a reduced fee at time of service.  [place] Adult Dental Access PROGRAM  [place] [phone] Patients are seen by appointment only. Walk-ins are not accepted. [place] will see patients 29 years of age and older. Monday - Tuesday (8am-5pm) Most Wednesdays (8:30-5pm) $30 per visit, cash only  [facility] Adult Dental Access PROGRAM  [address], [facility] [phone] Patients are seen by appointment only. Walk-ins are not accepted. [place] will see patients 45 years of age and older. One Wednesday Evening (Monthly: Volunteer Based).  $30 per visit, cash only  [place]  [phone] for adults; Children under age 62, call Graduate Pediatric Dentistry at [phone]. Children aged 39-14, please call [phone] to request a pediatric application.  Dental services are provided  in all areas of dental care including fillings, crowns and bridges, complete and partial dentures, implants, gum treatment, root canals, and extractions. Preventive care is also provided. Treatment is provided to both adults and children. Patients are selected via a lottery and there is often a waiting list.   [facility] [address], [place]  [phone] [email or web address]   [facility] [address] [place], [place] [phone], Ext. [phone] Second and Fourth Thursday of each month, opens at 6:30 AM; Clinic ends at 9 AM.  Patients are seen on a first-come first-served basis, and a limited number are seen during each clinic.   [facility]  [address] Hillard Danker [place], [place] [phone]   Eligibility Requirements You must have lived in [place], [place], or [place] counties for at least the last three months.   You cannot be eligible for state or federal sponsored [employer], including [employer], [place], or [employer].   You generally cannot be eligible for healthcare insurance through your employer.    How to apply: Eligibility screenings are held every Tuesday and Wednesday afternoon from 1:00 pm until 4:00 pm. You do not need an appointment for the interview!  [facility] [address] [place], [place]   [place]  [facility] Department  [place]  [phone]    Behavioral Health Resources in the Community: Intensive Outpatient Programs Organization         Address  Phone  Notes  [place]. [address], [place], [place] [phone]   [facility] Outpatient [address], [place], [place]   ADS: Alcohol & Drug Svcs [address] [place], [place]   [place] [address],  [place], [place] or [phone]   Substance Abuse Resources Organization         Address  Phone  Notes  Alcohol and Drug Services  [place]  [phone]   [facility] [place]   [facility]  [phone]  Residential & Outpatient Substance Abuse Program  [phone]   Psychological Services Organization         Address  Phone  Notes  [facility] [place]  [place]  [phone]   [place] [phone] N. [address], [place] or [phone]    Mobile Crisis Teams Organization         Address  Phone  Notes  [facility], Mobile Crisis Care Unit  [phone]   [facility]ive Psychotherapeutic Services  [address] [place], [place]   [place] [address], [place] [phone]    Self-Help/Support Groups Organization         Address  Phone             Notes  [place]. of [place] - variety of support groups  [place] Call for more information  Narcotics Anonymous (NA), [facility] [address], [employer] [place]  2 meetings at this location   Special educational needs teacher         Address  Phone  Notes  ASAP Residential Treatment [place],    [place]  [phone]   [employer] [place] [employer]  [address] [place] [phone], [place], [place]   [place], [place] [phone] Admissions: 8am-3pm M-F  Incentives Substance [place] [address],    [place], [place] [phone]   [facility] [address] [place], [place], [place]   [facility] [address],  [place], [place]   [facility] Programs - Intensive Outpatient [address], [place], [place]   [facility] ([place]  [place], [place] [phone] or  [phone]   [facility] Services (RTS) [address], [place], [place] Accepts Medicaid  [facility] [place] [address],  [place] [phone] Substance Abuse/Addiction Treatment   [facility] Organization         Address  Phone  Notes  [facility]  [phone]   Domenic Schwab, PhD [address] Arlis Porta [place], [place]   [phone] or [phone]   [place], [place] [phone]   [facility] [address] [place], [place] [phone] Insurance/Medicaid/sponsorship through [facility] and Families [address], [address] [place]                                    [place], [place] [phone] [place] [address] [place], [place] [phone]    Dr. Adele Schilder  [phone]   [facility] Dept. 1) [address] [place] 2) [place] [facility] 3)  [place] [address], [place] [phone]  [phone]   [place] [phone] or [phone] (After Hours)

## 2014-07-05 NOTE — ED Notes (Signed)
Unable to locate pt in waiting room.

## 2014-07-05 NOTE — ED Provider Notes (Signed)
CSN: [id number]     Arrival date & time 07/05/14  1647 History   First MD Initiated Contact with Patient 07/05/14 2122     Chief Complaint  Patient presents with  . Headache     (Consider location/radiation/quality/duration/timing/severity/associated sxs/prior Treatment) HPI Sandra Foster is a 23 y.o. female who presents to ED with complaint of a headache, feeling "drowsy" and having generalized malaise. Pt states that her symptoms began 3 days ago. States she has not tried taking any medications for this. States "I just feel sick." Pt denies any sore throat, nasal congestion, neck pain or stiffness. States has some soreness in her thighs. Pt states she has hx of gestational diabetes and thinks her sugar was elevated. She came yesterday, had blood work drawn and UA done but left before waiting for results. Pt denies urinary symptoms. No abd pain. No cough. No back pain. She is otherwise healthy.   Past Medical History  Diagnosis Date  . Hernia   . No pertinent past medical history   . Chlamydia   . Pregnancy induced hypertension   . Pyelonephritis   . Fetal demise    Past Surgical History  Procedure Laterality Date  . Hernia repair     Family History  Problem Relation Age of Onset  . Hypertension Maternal Grandmother    History  Substance Use Topics  . Smoking status: Current Every Day Smoker -- 0.25 packs/day  . Smokeless tobacco: Former Neurosurgeon    Quit date: 06/10/2011  . Alcohol Use: No   OB History   Grav Para Term Preterm Abortions TAB SAB Ect Mult Living   0     Review of Systems  Constitutional: Negative for fever and chills.  HENT: Negative for congestion, ear pain and sore throat.   Respiratory: Negative for cough, chest tightness and shortness of breath.   Cardiovascular: Negative for chest pain, palpitations and leg swelling.  Gastrointestinal: Negative for nausea, vomiting, abdominal pain and diarrhea.  Genitourinary: Negative for dysuria, flank  pain and pelvic pain.  Musculoskeletal: Positive for myalgias. Negative for arthralgias, neck pain and neck stiffness.  Skin: Negative for rash.  Neurological: Positive for headaches. Negative for dizziness and weakness.  All other systems reviewed and are negative.     Allergies  Review of patient's allergies indicates no known allergies.  Home Medications   Prior to Admission medications   Not on File   BP 110/67  Pulse 72  Temp(Src) 97.8 F (36.6 C) (Oral)  Resp 11  Wt 149 lb 6 oz (67.756 kg)  SpO2 98%  LMP 07/01/2014 Physical Exam  Nursing note and vitals reviewed. Constitutional: She is oriented to person, place, and time. She appears well-developed and well-nourished. No distress.  HENT:  Head: Normocephalic and atraumatic.  Right Ear: External ear normal.  Left Ear: External ear normal.  Nose: Nose normal.  Mouth/Throat: Oropharynx is clear and moist.  Eyes: Conjunctivae and EOM are normal. Pupils are equal, round, and reactive to light.  Neck: Normal range of motion. Neck supple.  No meningismus  Cardiovascular: Normal rate, regular rhythm and normal heart sounds.   Pulmonary/Chest: Effort normal and breath sounds normal. No respiratory distress. She has no wheezes. She has no rales.  Abdominal: Soft. She exhibits no distension. There is no tenderness. There is no rebound.  No CVA tenderness  Musculoskeletal: She exhibits no edema.  Neurological: She is alert and oriented to person, place, and time.  Skin: Skin is warm and dry.    ED Course  Procedures (including critical care time) Labs Review Labs Reviewed - No data to display  Imaging Review No results found.   EKG Interpretation None      MDM   Final diagnoses:  Nonintractable headache, unspecified chronicity pattern, unspecified headache type  Weakness    Pt with headache, generalized weakness. Had blood work and UA done yesterday. All normal. Not pregnant. VS normal. Afebrile. Given  toradol for headache. No meningismus. Stable for d/c home at this time. Will need outpatient follow up if not improving.  Filed Vitals:   07/05/14 1714 07/05/14 2130 07/05/14 2133 07/05/14 2145  BP: 122/63 110/67  106/65  Pulse: 72   57  Temp: 98.6 F (37 C)  97.8 F (36.6 C)   TempSrc:   Oral   Resp: Weight: 149 lb 6 oz (67.756 kg)     SpO2: 98%   100%      Lorijean Husser A Lakeitha Basques, PA-C 07/05/14 2356

## 2014-07-05 NOTE — ED Notes (Signed)
Per pt sts seen here last night and still having a headache 9/10. sts when she eats sugar it makes her feel drowsy and sick.

## 2014-07-06 NOTE — ED Provider Notes (Signed)
Medical screening examination/treatment/procedure(s) were performed by non-physician practitioner and as supervising physician I was immediately available for consultation/collaboration.   EKG Interpretation None       Derwood Kaplan, MD 07/06/14 1554

## 2014-08-30 ENCOUNTER — Encounter (HOSPITAL_COMMUNITY): Payer: Self-pay | Admitting: Emergency Medicine

## 2014-10-29 NOTE — L&D Delivery Note (Signed)
Delivery Note At 3:38 PM a viable female was delivered via Vaginal, Spontaneous Delivery (Presentation: Left Occiput Anterior).  APGAR: 8, 9; weight  .   Placenta status: Intact, Spontaneous.  Cord:  with the following complications: .  Cord pH: not done  Anesthesia: Epidural  Episiotomy: None Lacerations: 1st degree;Perineal Suture Repair: 2.0 Est. Blood Loss (mL): 300  Mom to postpartum.  Baby to Couplet care / Skin to Skin.  Rocklin Soderquist A 09/13/2015, 3:53 PM

## 2015-01-26 ENCOUNTER — Inpatient Hospital Stay (HOSPITAL_COMMUNITY)
Admission: AD | Admit: 2015-01-26 | Discharge: 2015-01-26 | Disposition: A | Discharge status: Home / Self Care | Payer: Medicaid Other | Source: Ambulatory Visit | Attending: Family Medicine | Admitting: Family Medicine

## 2015-01-26 ENCOUNTER — Encounter (HOSPITAL_COMMUNITY): Payer: Self-pay | Admitting: *Deleted

## 2015-01-26 DIAGNOSIS — F17210 Nicotine dependence, cigarettes, uncomplicated: Secondary | ICD-10-CM | POA: Insufficient documentation

## 2015-01-26 DIAGNOSIS — Z3201 Encounter for pregnancy test, result positive: Secondary | ICD-10-CM | POA: Insufficient documentation

## 2015-01-26 LAB — POCT PREGNANCY, URINE: Preg Test, Ur: POSITIVE — AB

## 2015-01-26 NOTE — MAU Provider Note (Signed)
  History     CSN: [id number]  Arrival date and time: 01/26/15 0405   None     No chief complaint on file.  HPI Ms. Sandra Foster is a 24 y.o. G4P1020 at [redacted]w[redacted]d here for confirmation of pregnancy.  Denies vaginal bleeding or abdominal pain.  Patient's last menstrual period was 12/06/2014.  Past Medical History  Diagnosis Date  . Hernia   . No pertinent past medical history   . Chlamydia   . Pregnancy induced hypertension   . Pyelonephritis   . Fetal demise     Past Surgical History  Procedure Laterality Date  . Hernia repair      Family History  Problem Relation Age of Onset  . Hypertension Maternal Grandmother     History  Substance Use Topics  . Smoking status: Current Every Day Smoker -- 0.25 packs/day  . Smokeless tobacco: Former Neurosurgeon    Quit date: 06/10/2011  . Alcohol Use: No    Allergies: No Known Allergies  No prescriptions prior to admission    Review of Systems  Gastrointestinal: Negative for abdominal pain.  Genitourinary: Negative.  Negative for dysuria, urgency and frequency.   Physical Exam   Blood pressure 111/68, pulse 72, temperature 98.2 F (36.8 C), temperature source Oral, resp. rate 20, height 5\' 5"  (1.651 m), weight 66.452 kg (146 lb 8 oz), last menstrual period 12/06/2014, SpO2 100 %.  Physical Exam  Constitutional: She is oriented to person, place, and time. She appears well-developed and well-nourished. No distress.  HENT:  Head: Normocephalic.  Neck: Normal range of motion. Neck supple.  Neurological: She is alert and oriented to person, place, and time. She has normal reflexes.  Skin: Skin is warm and dry.    MAU Course  Procedures  Results for orders placed or performed during the hospital encounter of 01/26/15 (from the past 24 hour(s))  Pregnancy, urine POC     Status: Abnormal   Collection Time: 01/26/15  4:24 AM  Result Value Ref Range   Preg Test, Ur POSITIVE (A) NEGATIVE     Assessment and Plan  24 y.o.  G4P0020 at [redacted]w[redacted]d Pregnancy Positive Pregnancy Test  Plan: Given confirmation of pregnancy Begin prenatal care.  Sandra Foster N 01/26/2015, 5:48 AM

## 2015-01-26 NOTE — MAU Note (Signed)
PT  SAYS  LMP WAS 2-8.   NO CYCLE  THIS  MTH.  NO  HPT ,  NO BIRTH CONTROL.  LAST SEX- 3-27.    SAYS HAD BABY 2 YEARS AGO- DEV GEST DIABETES-  BABY DIED 2 DAYS  BEFORE  DEL.    COME  TO FIND OUT  IF PREG

## 2015-01-26 NOTE — Discharge Instructions (Signed)
First Trimester of Pregnancy The first trimester of pregnancy is from week 1 until the end of week 12 (months 1 through 3). A week after a sperm fertilizes an egg, the egg will implant on the wall of the uterus. This embryo will begin to develop into a baby. Genes from you and your partner are forming the baby. The female genes determine whether the baby is a boy or a girl. At 6-8 weeks, the eyes and face are formed, and the heartbeat can be seen on ultrasound. At the end of 12 weeks, all the baby's organs are formed.  Now that you are pregnant, you will want to do everything you can to have a healthy baby. Two of the most important things are to get good prenatal care and to follow your health care provider's instructions. Prenatal care is all the medical care you receive before the baby's birth. This care will help prevent, find, and treat any problems during the pregnancy and childbirth. BODY CHANGES Your body goes through many changes during pregnancy. The changes vary from woman to woman.   You may gain or lose a couple of pounds at first.  You may feel sick to your stomach (nauseous) and throw up (vomit). If the vomiting is uncontrollable, call your health care provider.  You may tire easily.  You may develop headaches that can be relieved by medicines approved by your health care provider.  You may urinate more often. Painful urination may mean you have a bladder infection.  You may develop heartburn as a result of your pregnancy.  You may develop constipation because certain hormones are causing the muscles that push waste through your intestines to slow down.  You may develop hemorrhoids or swollen, bulging veins (varicose veins).  Your breasts may begin to grow larger and become tender. Your nipples may stick out more, and the tissue that surrounds them (areola) may become darker.  Your gums may bleed and may be sensitive to brushing and flossing.  Dark spots or blotches (chloasma,  mask of pregnancy) may develop on your face. This will likely fade after the baby is born.  Your menstrual periods will stop.  You may have a loss of appetite.  You may develop cravings for certain kinds of food.  You may have changes in your emotions from day to day, such as being excited to be pregnant or being concerned that something may go wrong with the pregnancy and baby.  You may have more vivid and strange dreams.  You may have changes in your hair. These can include thickening of your hair, rapid growth, and changes in texture. Some women also have hair loss during or after pregnancy, or hair that feels dry or thin. Your hair will most likely return to normal after your baby is born. WHAT TO EXPECT AT YOUR PRENATAL VISITS During a routine prenatal visit:  You will be weighed to make sure you and the baby are growing normally.  Your blood pressure will be taken.  Your abdomen will be measured to track your baby's growth.  The fetal heartbeat will be listened to starting around week 10 or 12 of your pregnancy.  Test results from any previous visits will be discussed. Your health care provider may ask you:  How you are feeling.  If you are feeling the baby move.  If you have had any abnormal symptoms, such as leaking fluid, bleeding, severe headaches, or abdominal cramping.  If you have any questions. Other tests   that may be performed during your first trimester include:  Blood tests to find your blood type and to check for the presence of any previous infections. They will also be used to check for low iron levels (anemia) and Rh antibodies. Later in the pregnancy, blood tests for diabetes will be done along with other tests if problems develop.  Urine tests to check for infections, diabetes, or protein in the urine.  An ultrasound to confirm the proper growth and development of the baby.  An amniocentesis to check for possible genetic problems.  Fetal screens for  spina bifida and Down syndrome.  You may need other tests to make sure you and the baby are doing well. HOME CARE INSTRUCTIONS  Medicines  Follow your health care provider's instructions regarding medicine use. Specific medicines may be either safe or unsafe to take during pregnancy.  Take your prenatal vitamins as directed.  If you develop constipation, try taking a stool softener if your health care provider approves. Diet  Eat regular, well-balanced meals. Choose a variety of foods, such as meat or vegetable-based protein, fish, milk and low-fat dairy products, vegetables, fruits, and whole grain breads and cereals. Your health care provider will help you determine the amount of weight gain that is right for you.  Avoid raw meat and uncooked cheese. These carry germs that can cause birth defects in the baby.  Eating four or five small meals rather than three large meals a day may help relieve nausea and vomiting. If you start to feel nauseous, eating a few soda crackers can be helpful. Drinking liquids between meals instead of during meals also seems to help nausea and vomiting.  If you develop constipation, eat more high-fiber foods, such as fresh vegetables or fruit and whole grains. Drink enough fluids to keep your urine clear or pale yellow. Activity and Exercise  Exercise only as directed by your health care provider. Exercising will help you:  Control your weight.  Stay in shape.  Be prepared for labor and delivery.  Experiencing pain or cramping in the lower abdomen or low back is a good sign that you should stop exercising. Check with your health care provider before continuing normal exercises.  Try to avoid standing for long periods of time. Move your legs often if you must stand in one place for a long time.  Avoid heavy lifting.  Wear low-heeled shoes, and practice good posture.  You may continue to have sex unless your health care provider directs you  otherwise. Relief of Pain or Discomfort  Wear a good support bra for breast tenderness.   Take warm sitz baths to soothe any pain or discomfort caused by hemorrhoids. Use hemorrhoid cream if your health care provider approves.   Rest with your legs elevated if you have leg cramps or low back pain.  If you develop varicose veins in your legs, wear support hose. Elevate your feet for 15 minutes, 3-4 times a day. Limit salt in your diet. Prenatal Care  Schedule your prenatal visits by the twelfth week of pregnancy. They are usually scheduled monthly at first, then more often in the last 2 months before delivery.  Write down your questions. Take them to your prenatal visits.  Keep all your prenatal visits as directed by your health care provider. Safety  Wear your seat belt at all times when driving.  Make a list of emergency phone numbers, including numbers for family, friends, the hospital, and police and fire departments. General Tips    Ask your health care provider for a referral to a local prenatal education class. Begin classes no later than at the beginning of month 6 of your pregnancy.  Ask for help if you have counseling or nutritional needs during pregnancy. Your health care provider can offer advice or refer you to specialists for help with various needs.  Do not use hot tubs, steam rooms, or saunas.  Do not douche or use tampons or scented sanitary pads.  Do not cross your legs for long periods of time.  Avoid cat litter boxes and soil used by cats. These carry germs that can cause birth defects in the baby and possibly loss of the fetus by miscarriage or stillbirth.  Avoid all smoking, herbs, alcohol, and medicines not prescribed by your health care provider. Chemicals in these affect the formation and growth of the baby.  Schedule a dentist appointment. At home, brush your teeth with a soft toothbrush and be gentle when you floss. SEEK MEDICAL CARE IF:   You have  dizziness.  You have mild pelvic cramps, pelvic pressure, or nagging pain in the abdominal area.  You have persistent nausea, vomiting, or diarrhea.  You have a bad smelling vaginal discharge.  You have pain with urination.  You notice increased swelling in your face, hands, legs, or ankles. SEEK IMMEDIATE MEDICAL CARE IF:   You have a fever.  You are leaking fluid from your vagina.  You have spotting or bleeding from your vagina.  You have severe abdominal cramping or pain.  You have rapid weight gain or loss.  You vomit blood or material that looks like coffee grounds.  You are exposed to German measles and have never had them.  You are exposed to fifth disease or chickenpox.  You develop a severe headache.  You have shortness of breath.  You have any kind of trauma, such as from a fall or a car accident. Document Released: 10/09/2001 Document Revised: 03/01/2014 Document Reviewed: 08/25/2013 [employer] Patient Information 2015 [employer]. This information is not intended to replace advice given to you by your health care provider. Make sure you discuss any questions you have with your health care provider.  

## 2015-02-14 ENCOUNTER — Inpatient Hospital Stay (HOSPITAL_COMMUNITY): Payer: Medicaid Other

## 2015-02-14 ENCOUNTER — Encounter (HOSPITAL_COMMUNITY): Payer: Self-pay | Admitting: *Deleted

## 2015-02-14 ENCOUNTER — Inpatient Hospital Stay (HOSPITAL_COMMUNITY)
Admission: AD | Admit: 2015-02-14 | Discharge: 2015-02-14 | Disposition: A | Discharge status: Home / Self Care | Payer: Medicaid Other | Source: Ambulatory Visit | Attending: Obstetrics and Gynecology | Admitting: Obstetrics and Gynecology

## 2015-02-14 DIAGNOSIS — O9989 Other specified diseases and conditions complicating pregnancy, childbirth and the puerperium: Secondary | ICD-10-CM

## 2015-02-14 DIAGNOSIS — R109 Unspecified abdominal pain: Secondary | ICD-10-CM | POA: Diagnosis not present

## 2015-02-14 DIAGNOSIS — Z3A08 8 weeks gestation of pregnancy: Secondary | ICD-10-CM | POA: Diagnosis not present

## 2015-02-14 DIAGNOSIS — Z87891 Personal history of nicotine dependence: Secondary | ICD-10-CM | POA: Insufficient documentation

## 2015-02-14 DIAGNOSIS — O26899 Other specified pregnancy related conditions, unspecified trimester: Secondary | ICD-10-CM

## 2015-02-14 LAB — URINALYSIS, ROUTINE W REFLEX MICROSCOPIC
BILIRUBIN URINE: NEGATIVE
GLUCOSE, UA: NEGATIVE mg/dL
Hgb urine dipstick: NEGATIVE
Ketones, ur: NEGATIVE mg/dL
Nitrite: NEGATIVE
Protein, ur: NEGATIVE mg/dL
Specific Gravity, Urine: 1.015 (ref 1.005–1.030)
UROBILINOGEN UA: 0.2 mg/dL (ref 0.0–1.0)
pH: 7.5 (ref 5.0–8.0)

## 2015-02-14 LAB — CBC
HCT: 32.7 % — ABNORMAL LOW (ref 36.0–46.0)
Hemoglobin: 11.2 g/dL — ABNORMAL LOW (ref 12.0–15.0)
MCH: 29.7 pg (ref 26.0–34.0)
MCHC: 34.3 g/dL (ref 30.0–36.0)
MCV: 86.7 fL (ref 78.0–100.0)
PLATELETS: 197 10*3/uL (ref 150–400)
RBC: 3.77 MIL/uL — ABNORMAL LOW (ref 3.87–5.11)
RDW: 13.6 % (ref 11.5–15.5)
WBC: 9.1 10*3/uL (ref 4.0–10.5)

## 2015-02-14 LAB — URINE MICROSCOPIC-ADD ON

## 2015-02-14 LAB — WET PREP, GENITAL
TRICH WET PREP: NONE SEEN
Yeast Wet Prep HPF POC: NONE SEEN

## 2015-02-14 LAB — HCG, QUANTITATIVE, PREGNANCY: HCG, BETA CHAIN, QUANT, S: 105111 m[IU]/mL — AB (ref <5)

## 2015-02-14 NOTE — Discharge Instructions (Signed)

## 2015-02-14 NOTE — MAU Provider Note (Signed)
History     CSN: [id number]  Arrival date and time: 02/14/15 1012    Chief Complaint  Patient presents with  . Abdominal Pain   HPI:   Sandra Foster is a 24 year old female currently 10w (unsure of exact LMP) with PMH of SAB in 2015 and term fetal demise in 2012 who presents to [facility] for lower abdominal pain for the past 2 days. She reports the pain is "shooting" in nature and comes and goes throughout the day. The pain is located in her right lower quadrant and is worse when lying down. She reports having the pain 3 weeks ago and says that is what initially brought her to the ER when she found out she was pregnant. The pain resolved from that episode until recently. She has not taken medications for the pain, nor has she tried ice or heat. She denies any vaginal bleeding or discharge. She plans to establish care with Dr. Gaynell Face. She has not yet started taking prenatal vitamins.   OB History    Gravida Para Term Preterm AB TAB SAB Ectopic Multiple Living   0         Past Medical History  Diagnosis Date  . Hernia   . Chlamydia   . Pregnancy induced hypertension   . Pyelonephritis   . Fetal demise     Past Surgical History  Procedure Laterality Date  . Hernia repair      Family History  Problem Relation Age of Onset  . Hypertension Maternal Grandmother     History  Substance Use Topics  . Smoking status: Former Smoker -- 0.25 packs/day  . Smokeless tobacco: Former Neurosurgeon    Quit date: 06/10/2011  . Alcohol Use: No    Allergies: No Known Allergies  No prescriptions prior to admission   Results for orders placed or performed during the hospital encounter of 02/14/15 (from the past 48 hour(s))  Urinalysis, Routine w reflex microscopic     Status: Abnormal   Collection Time: 02/14/15 10:46 AM  Result Value Ref Range   Color, Urine YELLOW YELLOW   APPearance CLOUDY (A) CLEAR   Specific Gravity, Urine 1.015 1.005 - 1.030   pH 7.5 5.0 -  8.0   Glucose, UA NEGATIVE NEGATIVE mg/dL   Hgb urine dipstick NEGATIVE NEGATIVE   Bilirubin Urine NEGATIVE NEGATIVE   Ketones, ur NEGATIVE NEGATIVE mg/dL   Protein, ur NEGATIVE NEGATIVE mg/dL   Urobilinogen, UA 0.2 0.0 - 1.0 mg/dL   Nitrite NEGATIVE NEGATIVE   Leukocytes, UA TRACE (A) NEGATIVE  Urine microscopic-add on     Status: Abnormal   Collection Time: 02/14/15 10:46 AM  Result Value Ref Range   Squamous Epithelial / LPF FEW (A) RARE   WBC, UA 0-2 <3 WBC/hpf   RBC / HPF 0-2 <3 RBC/hpf   Bacteria, UA FEW (A) RARE  Wet prep, genital     Status: Abnormal   Collection Time: 02/14/15 11:35 AM  Result Value Ref Range   Yeast Wet Prep HPF POC NONE SEEN NONE SEEN   Trich, Wet Prep NONE SEEN NONE SEEN   Clue Cells Wet Prep HPF POC FEW (A) NONE SEEN   WBC, Wet Prep HPF POC FEW (A) NONE SEEN    Comment: MODERATE BACTERIA SEEN  hCG, quantitative, pregnancy     Status: Abnormal   Collection Time: 02/14/15 11:40 AM  Result Value Ref Range   hCG, Beta Chain, Quant, S 105111 (  H) <5 mIU/mL    Comment:          GEST. AGE      CONC.  (mIU/mL)   <=1 WEEK        5 - 50     2 WEEKS       50 - 500     3 WEEKS       100 - 10,000     4 WEEKS     1,000 - 30,000     5 WEEKS     3,500 - 115,000   6-8 WEEKS     12,000 - 270,000    12 WEEKS     15,000 - 220,000        FEMALE AND NON-PREGNANT FEMALE:     LESS THAN 5 mIU/mL   CBC     Status: Abnormal   Collection Time: 02/14/15 11:41 AM  Result Value Ref Range   WBC 9.1 4.0 - 10.5 K/uL   RBC 3.77 (L) 3.87 - 5.11 MIL/uL   Hemoglobin 11.2 (L) 12.0 - 15.0 g/dL   HCT 81.1 (L) 91.4 - 78.2 %   MCV 86.7 78.0 - 100.0 fL   MCH 29.7 26.0 - 34.0 pg   MCHC 34.3 30.0 - 36.0 g/dL   RDW 95.6 21.3 - 08.6 %   Platelets 197 150 - 400 K/uL    Review of Systems  Constitutional: Negative for fever, chills and malaise/fatigue.  HENT: Negative for congestion and sore throat.   Respiratory: Negative for cough, shortness of breath and wheezing.    Cardiovascular: Negative for chest pain, palpitations and leg swelling.  Gastrointestinal: Positive for abdominal pain (right lower quadrant). Negative for nausea, vomiting, diarrhea, constipation and blood in stool.  Genitourinary: Negative for dysuria, urgency, frequency and hematuria.  Musculoskeletal: Negative for myalgias and back pain.  Neurological: Negative for dizziness, seizures, loss of consciousness, weakness and headaches.   Physical Exam   Blood pressure 101/57, pulse 75, temperature 98 F (36.7 C), temperature source Oral, resp. rate 18, height  (1.651 m), weight 66.395 kg (146 lb 6 oz), last menstrual period 12/06/2014, SpO2 100 %.  Physical Exam  Nursing note and vitals reviewed. Constitutional: She is oriented to person, place, and time. She appears well-developed and well-nourished. No distress.  HENT:  Head: Normocephalic and atraumatic.  Eyes: Pupils are equal, round, and reactive to light.  Neck: Normal range of motion. Neck supple. No thyromegaly present.  Cardiovascular: Normal rate, regular rhythm, normal heart sounds and intact distal pulses.  Exam reveals no gallop and no friction rub.   No murmur heard. Respiratory: Effort normal and breath sounds normal. No stridor. No respiratory distress. She has no wheezes. She has no rales. She exhibits no tenderness.  GI: Soft. Bowel sounds are normal. She exhibits no distension and no mass. There is no tenderness. There is no rebound, no guarding, no tenderness at McBurney's point and negative Murphy's sign.  Genitourinary:  Labia without lesions. Cervix visualized and pink in color with minimal whitish discharge present. No cervical motion tenderness noted. No adnexal masses appreciated on manual examination.  Musculoskeletal: Normal range of motion. She exhibits no edema or tenderness.  Lymphadenopathy:    She has no cervical adenopathy.  Neurological: She is alert and oriented to person, place, and time.  Skin:  Skin is warm and dry. No rash noted. She is not diaphoretic. No erythema. No pallor.    MAU Course  Procedures  None  MDM: Unable to  obtain FHT with Doppler, but patient states she is unsure of exact LMP. Will obtain Quant hCG, CBC, HIV, ABO/Rh, Wet Prep, GC/Chlamydia, and [place].  US showed SIUP with gestational age [redacted]w[redacted]d with cardiac activity.    Assessment and Plan  A: Abdominal Pain during Pregnancy     SIUP   P:  Patient plans to establish care with Dr. Gaynell Face  Instructed patient to begin prenatal vitamins  Given handout on what to expect during 1st trimester of pregnancy  Given return precautions for MAU    Duane Lope, NP 02/14/2015 1:58 PM

## 2015-02-14 NOTE — MAU Note (Signed)
Lower abd pain for the past 3 days, worse when lying down.  Denies bleeding or discharge.

## 2015-02-15 LAB — GC/CHLAMYDIA PROBE AMP (~~LOC~~) NOT AT ARMC
Chlamydia: NEGATIVE
Neisseria Gonorrhea: NEGATIVE

## 2015-02-15 LAB — HIV ANTIBODY (ROUTINE TESTING W REFLEX): HIV Screen 4th Generation wRfx: NONREACTIVE

## 2015-02-19 ENCOUNTER — Encounter (HOSPITAL_COMMUNITY): Payer: Self-pay | Admitting: Emergency Medicine

## 2015-02-19 ENCOUNTER — Emergency Department (INDEPENDENT_AMBULATORY_CARE_PROVIDER_SITE_OTHER)
Admission: EM | Admit: 2015-02-19 | Discharge: 2015-02-19 | Disposition: A | Discharge status: Home / Self Care | Payer: Medicaid Other | Source: Home / Self Care | Attending: Family Medicine | Admitting: Family Medicine

## 2015-02-19 DIAGNOSIS — S025XXA Fracture of tooth (traumatic), initial encounter for closed fracture: Secondary | ICD-10-CM | POA: Diagnosis not present

## 2015-02-19 DIAGNOSIS — K0889 Other specified disorders of teeth and supporting structures: Secondary | ICD-10-CM

## 2015-02-19 DIAGNOSIS — K088 Other specified disorders of teeth and supporting structures: Secondary | ICD-10-CM

## 2015-02-19 MED ORDER — HYDROCODONE-ACETAMINOPHEN 5-325 MG PO TABS: ORAL_TABLET | ORAL | Status: DC

## 2015-02-19 MED ORDER — AMOXICILLIN 500 MG PO CAPS: 500.0000 mg | ORAL_CAPSULE | Freq: Three times a day (TID) | ORAL | Status: DC

## 2015-02-19 NOTE — ED Provider Notes (Signed)
CSN: [id number]     Arrival date & time 02/19/15  1651 History   First MD Initiated Contact with Patient 02/19/15 1843     Chief Complaint  Patient presents with  . Dental Pain   (Consider location/radiation/quality/duration/timing/severity/associated sxs/prior Treatment) HPI Comments: 24 year old female complaining of toothache. She points to the left lower second molar with her is a fragment of enamel missing. Pain started yesterday. It radiates to the left ear. Patient states she is not a weeks pregnant.   Past Medical History  Diagnosis Date  . Hernia   . Chlamydia   . Pregnancy induced hypertension   . Pyelonephritis   . Fetal demise    Past Surgical History  Procedure Laterality Date  . Hernia repair     Family History  Problem Relation Age of Onset  . Hypertension Maternal Grandmother    History  Substance Use Topics  . Smoking status: Former Smoker -- 0.25 packs/day  . Smokeless tobacco: Former Neurosurgeon    Quit date: 06/10/2011  . Alcohol Use: No   OB History    Gravida Para Term Preterm AB TAB SAB Ectopic Multiple Living   0        Review of Systems  Constitutional: Negative.   HENT: Positive for dental problem.   Respiratory: Negative.   Genitourinary: Negative.   Neurological: Negative.   All other systems reviewed and are negative.   Allergies  Review of patient's allergies indicates no known allergies.  Home Medications   Prior to Admission medications   Medication Sig Start Date End Date Taking? Authorizing Provider  amoxicillin (AMOXIL) 500 MG capsule Take 1 capsule (500 mg total) by mouth 3 (three) times daily. 02/19/15   Hayden Rasmussen, NP  HYDROcodone-acetaminophen (NORCO/VICODIN) 5-325 MG per tablet 1/2 to 1 tablet q 4 to 6 h prn pain. 02/19/15   Hayden Rasmussen, NP   BP 101/57 mmHg  Pulse 72  Temp(Src) 99.1 F (37.3 C) (Oral)  Resp 16  SpO2 100%  LMP 12/06/2014 Physical Exam  Constitutional: She is oriented to person, place, and time.  She appears well-developed and well-nourished. No distress.  HENT:  Bilateral TMs are normal The  lower left second molar with chipped enamel to the posterior/mesial aspect. No gingival swelling or abscess formation seen.  Neck: Normal range of motion. Neck supple.  Cardiovascular: Normal rate.   Pulmonary/Chest: Effort normal. No respiratory distress.  Lymphadenopathy:    She has no cervical adenopathy.  Neurological: She is alert and oriented to person, place, and time.  Skin: Skin is warm and dry.  Psychiatric: She has a normal mood and affect.  Nursing note and vitals reviewed.   ED Course  Procedures (including critical care time) Labs Review Labs Reviewed - No data to display  Imaging Review No results found.   MDM   1. Fracture, tooth, closed, initial encounter   2. Toothache    Take as little pain medication as possible. There is evidence of possible fetal problems in early pregnancy. Take 1/2 tablet with tylenol if it helps. See denitst ASAP    Hayden Rasmussen, NP 02/19/15 [phone]

## 2015-02-19 NOTE — ED Notes (Signed)
C/o dental pain on the left lower side.  On set yesterday.  Pt is pregnant.  States I have a whole in my tooth.

## 2015-02-19 NOTE — Discharge Instructions (Signed)
Dental Pain Take as little pain medication as possible. There is evidence of possible fetal problems in early pregnancy. Take 1/2 tablet with tylenol if it helps. A tooth ache may be caused by cavities (tooth decay). Cavities expose the nerve of the tooth to air and hot or cold temperatures. It may come from an infection or abscess (also called a boil or furuncle) around your tooth. It is also often caused by dental caries (tooth decay). This causes the pain you are having. DIAGNOSIS  Your caregiver can diagnose this problem by exam. TREATMENT   If caused by an infection, it may be treated with medications which kill germs (antibiotics) and pain medications as prescribed by your caregiver. Take medications as directed.  Only take over-the-counter or prescription medicines for pain, discomfort, or fever as directed by your caregiver.  Whether the tooth ache today is caused by infection or dental disease, you should see your dentist as soon as possible for further care. SEEK MEDICAL CARE IF: The exam and treatment you received today has been provided on an emergency basis only. This is not a substitute for complete medical or dental care. If your problem worsens or new problems (symptoms) appear, and you are unable to meet with your dentist, call or return to this location. SEEK IMMEDIATE MEDICAL CARE IF:   You have a fever.  You develop redness and swelling of your face, jaw, or neck.  You are unable to open your mouth.  You have severe pain uncontrolled by pain medicine. MAKE SURE YOU:   Understand these instructions.  Will watch your condition.  Will get help right away if you are not doing well or get worse. Document Released: 10/15/2005 Document Revised: 01/07/2012 Document Reviewed: 06/02/2008 [facility] Patient Information 2015 [place], [place]. This information is not intended to replace advice given to you by your health care provider. Make sure you discuss any questions you have  with your health care provider.  Dental Care and Dentist Visits Dental care supports good overall health. Regular dental visits can also help you avoid dental pain, bleeding, infection, and other more serious health problems in the future. It is important to keep the mouth healthy because diseases in the teeth, gums, and other oral tissues can spread to other areas of the body. Some problems, such as diabetes, heart disease, and pre-term labor have been associated with poor oral health.  See your dentist every 6 months. If you experience emergency problems such as a toothache or broken tooth, go to the dentist right away. If you see your dentist regularly, you may catch problems early. It is easier to be treated for problems in the early stages.  WHAT TO EXPECT AT A DENTIST VISIT  Your dentist will look for many common oral health problems and recommend proper treatment. At your regular dental visit, you can expect:  Gentle cleaning of the teeth and gums. This includes scraping and polishing. This helps to remove the sticky substance around the teeth and gums (plaque). Plaque forms in the mouth shortly after eating. Over time, plaque hardens on the teeth as tartar. If tartar is not removed regularly, it can cause problems. Cleaning also helps remove stains.  Periodic X-rays. These pictures of the teeth and supporting bone will help your dentist assess the health of your teeth.  Periodic fluoride treatments. Fluoride is a natural mineral shown to help strengthen teeth. Fluoride treatmentinvolves applying a fluoride gel or varnish to the teeth. It is most commonly done in children.  Examination of the mouth, tongue, jaws, teeth, and gums to look for any oral health problems, such as:  Cavities (dental caries). This is decay on the tooth caused by plaque, sugar, and acid in the mouth. It is best to catch a cavity when it is small.  Inflammation of the gums caused by plaque buildup  (gingivitis).  Problems with the mouth or malformed or misaligned teeth.  Oral cancer or other diseases of the soft tissues or jaws. KEEP YOUR TEETH AND GUMS HEALTHY For healthy teeth and gums, follow these general guidelines as well as your dentist's specific advice:  Have your teeth professionally cleaned at the dentist every 6 months.  Brush twice daily with a fluoride toothpaste.  Floss your teeth daily.  Ask your dentist if you need fluoride supplements, treatments, or fluoride toothpaste.  Eat a healthy diet. Reduce foods and drinks with added sugar.  Avoid smoking. TREATMENT FOR ORAL HEALTH PROBLEMS If you have oral health problems, treatment varies depending on the conditions present in your teeth and gums.  Your caregiver will most likely recommend good oral hygiene at each visit.  For cavities, gingivitis, or other oral health disease, your caregiver will perform a procedure to treat the problem. This is typically done at a separate appointment. Sometimes your caregiver will refer you to another dental specialist for specific tooth problems or for surgery. SEEK IMMEDIATE DENTAL CARE IF:  You have pain, bleeding, or soreness in the gum, tooth, jaw, or mouth area.  A permanent tooth becomes loose or separated from the gum socket.  You experience a blow or injury to the mouth or jaw area. Document Released: 06/27/2011 Document Revised: 01/07/2012 Document Reviewed: 06/27/2011 [facility] Patient Information 2015 [place], [place]. This information is not intended to replace advice given to you by your health care provider. Make sure you discuss any questions you have with your health care provider.

## 2015-02-19 NOTE — ED Notes (Signed)
Pt is nine weeks pregnant.  Mw,cma

## 2015-03-02 ENCOUNTER — Emergency Department (HOSPITAL_COMMUNITY)
Admission: EM | Admit: 2015-03-02 | Discharge: 2015-03-02 | Disposition: A | Discharge status: Home / Self Care | Payer: Medicaid Other | Attending: Emergency Medicine | Admitting: Emergency Medicine

## 2015-03-02 ENCOUNTER — Encounter (HOSPITAL_COMMUNITY): Payer: Self-pay | Admitting: Emergency Medicine

## 2015-03-02 DIAGNOSIS — Z792 Long term (current) use of antibiotics: Secondary | ICD-10-CM | POA: Insufficient documentation

## 2015-03-02 DIAGNOSIS — Z87891 Personal history of nicotine dependence: Secondary | ICD-10-CM | POA: Diagnosis not present

## 2015-03-02 DIAGNOSIS — K047 Periapical abscess without sinus: Secondary | ICD-10-CM | POA: Diagnosis not present

## 2015-03-02 DIAGNOSIS — Z3A10 10 weeks gestation of pregnancy: Secondary | ICD-10-CM | POA: Insufficient documentation

## 2015-03-02 DIAGNOSIS — Z8619 Personal history of other infectious and parasitic diseases: Secondary | ICD-10-CM | POA: Insufficient documentation

## 2015-03-02 DIAGNOSIS — Z87448 Personal history of other diseases of urinary system: Secondary | ICD-10-CM | POA: Diagnosis not present

## 2015-03-02 DIAGNOSIS — O99611 Diseases of the digestive system complicating pregnancy, first trimester: Secondary | ICD-10-CM | POA: Diagnosis present

## 2015-03-02 MED ORDER — AMOXICILLIN 500 MG PO CAPS: 500.0000 mg | ORAL_CAPSULE | Freq: Three times a day (TID) | ORAL | Status: DC

## 2015-03-02 MED ORDER — LIDOCAINE HCL (PF) 2 % IJ SOLN
0.0000 mL | Freq: Once | INTRAMUSCULAR | Status: AC | PRN
  Administered 2015-03-02: 10 mL via INTRADERMAL
  Filled 2015-03-02: qty 10

## 2015-03-02 NOTE — Discharge Instructions (Signed)
Return to the emergency room with worsening of symptoms, new symptoms or with symptoms that are concerning, especially fevers, facial swelling, tongue swelling, swelling under tongue, drooling, difficulty breathing or swallowing. Please take all of your antibiotics until finished!   You may develop abdominal discomfort or diarrhea from the antibiotic.  You may help offset this with probiotics which you can buy or get in yogurt. Do not eat  or take the probiotics until 2 hours after your antibiotic.  Call to make an appointment with your dentist as soon as possible for follow-up. Take tylenol for pain 250-500mg  as needed for pain. No more than 8 tablets or 4,000mg  in a day. Salt Water Gargle This solution will help make your mouth and throat feel better. HOME CARE INSTRUCTIONS   Mix 1 teaspoon of salt in 8 ounces of warm water.  Gargle with this solution as much or often as you need or as directed. Swish and gargle gently if you have any sores or wounds in your mouth.  Do not swallow this mixture. Document Released: 07/19/2004 Document Revised: 01/07/2012 Document Reviewed: 12/10/2008 [facility] Patient Information 2015 [place], [place]. This information is not intended to replace advice given to you by your health care provider. Make sure you discuss any questions you have with your health care provider.   Emergency Department Resource Guide 1) Find a Doctor and Pay Out of Pocket Although you won't have to find out who is covered by your insurance plan, it is a good idea to ask around and get recommendations. You will then need to call the office and see if the doctor you have chosen will accept you as a new patient and what types of options they offer for patients who are self-pay. Some doctors offer discounts or will set up payment plans for their patients who do not have insurance, but you will need to ask so you aren't surprised when you get to your appointment.  2) Contact Your Local Health  Department Not all health departments have doctors that can see patients for sick visits, but many do, so it is worth a call to see if yours does. If you don't know where your local health department is, you can check in your phone book. The CDC also has a tool to help you locate your state's health department, and many state websites also have listings of all of their local health departments.  3) Find a Walk-in Clinic If your illness is not likely to be very severe or complicated, you may want to try a walk in clinic. These are popping up all over the country in pharmacies, drugstores, and shopping centers. They're usually staffed by nurse practitioners or physician assistants that have been trained to treat common illnesses and complaints. They're usually fairly quick and inexpensive. However, if you have serious medical issues or chronic medical problems, these are probably not your best option.  No Primary Care Doctor: - Call Health Connect at  [phone] - they can help you locate a primary care doctor that  accepts your insurance, provides certain services, etc. - Physician Referral Service- [phone]  Chronic Pain Problems: Organization         Address  Phone   Notes  [facility]  [phone] Patients need to be referred by their primary care doctor.   Medication Assistance: Organization         Address  Phone   Notes  [facility] Medication Assistance Program [address]  [address] [place], [place] [address] [phone] --Must be a resident of [facility] -- Must have NO insurance coverage whatsoever (no Medicaid/ Medicare, etc.) -- The pt. MUST have a primary care doctor that directs their care regularly and follows them in the community   MedAssist  [phone]   [employer]  [phone]    Agencies that provide inexpensive medical care: Organization         Address  Phone   Notes  [facility]  [phone]   [facility]    [phone]   [facility] [address] [place], [place] [address] [phone]   [facility] [address] [place], [place] [phone]   Planned Parenthood    ([phone]   [facility]    [phone]   [facility]  [address], [place] Phone:  [phone], Fax:  [phone] Hours of Operation:  9 am - 6 pm, M-F.  Also accepts Medicaid/Medicare and self-pay.  [facility]  [address], [place] Phone: [phone], Fax: [phone]. Hours of Operation:  8:30 am - 5:30 pm, M-F.  Also accepts Medicaid and self-pay.  [facility] [place] [address], [place] Phone: [phone]   [facility] [address] Natasha Bence [place], [place] [phone], Ext. [phone] Mondays & Thursdays: 7-9 AM.  First 15 patients are seen on a first come, first serve basis.    Medicaid-accepting [facility] Providers:  Organization         Address  Phone   Notes  [facility] [address], [place] [phone] Also accepts self-pay patients.  [facility] [address] Laurell Josephs [place], [place]  [phone]   [facility] [address], [place] [phone]   [facility] [address] [place] [phone]   Renaye Rakers [address], [place]   [phone] Only accepts [place] Access [place] patients after they have their name applied to their card.   Self-Pay (no insurance) in [facility]:  Organization         Address  Phone   Notes  Sickle Cell Patients, [facility] [address] [place], [place] [phone]   [facility] [address] [place], [place] [phone]   [facility] [place]  [address],  [place] [phone]   [facility]/Dr. Osei-Bonsu  [address] [place] or [address], [place] [phone] Phone number for both [place] and [place] locations is the same.  Urgent Medical and [facility] [address] [place] [phone]   [facility] [address] [place] or [address] [phone]   [facility] [address], [place] [phone], phone; [phone], fax Sees patients 1st and 3rd Saturday of every month.  Must not qualify for public or private insurance (i.e. Medicaid, Medicare, [place] Health Choice, Veterans' Benefits)  Household income should be no more than 200% of the poverty level The clinic cannot treat you if you are pregnant or think you are pregnant  Sexually transmitted diseases are not treated at the clinic.    Dental  Care: Organization         Address  Phone  Notes  [facility] Department of [facility] [address] [place], [place] [phone] Accepts children up to age 84 who are enrolled in [place] or [place] Health Choice; pregnant women with a Medicaid card; and children who have applied for Medicaid or [place] Health Choice, but were declined, whose parents can pay a reduced fee at time of service.  [facility] Department of [facility]  [address], [place] [phone] Accepts children up to age 85 who are enrolled in [place] or [place] Health Choice; pregnant women with a Medicaid card; and children who have applied for Medicaid or [place] Health Choice, but were declined, whose parents can pay a reduced fee at time of service.  [facility]  [address] [place], [place] [phone] Patients are seen by appointment only. Walk-ins are not accepted. [facility] will see patients 65 years of age and older. Monday - Tuesday (8am-5pm) Most Wednesdays  (8:30-5pm) $30 per visit, cash only  [facility] Adult Dental Access PROGRAM  [address], [facility] [phone] Patients are seen by appointment only. Walk-ins are not accepted. [facility] will see patients 54 years of age and older. One Wednesday Evening (Monthly: Volunteer Based).  $30 per visit, cash only  [employer]  [phone] for adults; Children under age 30, call Graduate Pediatric Dentistry at [phone]. Children aged 87-14, please call [phone] to request a pediatric application.  Dental services are provided in all areas of dental care including fillings, crowns and bridges, complete and partial dentures, implants, gum treatment, root canals, and extractions. Preventive care is also provided. Treatment is provided to both adults and children. Patients are selected via a lottery and there is often a waiting list.   [facility] [address] [place]  [phone] [email or web address]   [facility] [address] [place], [place] [phone], Ext. [phone] Second and Fourth Thursday of each month, opens at 6:30 AM; Clinic ends at 9 AM.  Patients are seen on a first-come first-served basis, and a limited number are seen during each clinic.   [facility]  [address] Ether [place], [place] [phone]   Eligibility Requirements You must have lived in [place], [place], or [place] counties for at least the last three months.   You cannot be eligible for state or federal sponsored [place], including [employer], [place], or [employer].   You generally cannot be eligible for healthcare insurance through your employer.    How to apply: Eligibility screenings are held every Tuesday and Wednesday afternoon from 1:00 pm until 4:00 pm. You do not need an appointment for the interview!  [facility] [address] [place], [place] [phone]   [facility]  Health Department  [phone]   [facility] Health Department  [phone]   [facility] Health Department  [phone]    Behavioral Health Resources in the Community: Intensive Outpatient Programs Organization         Address  Phone  Notes  [facility] Services [address] [place], [place] [phone]   [facility] [address], [place], [place] [phone]   [facility] [address] [place], [place]  [phone]   [facility] [address],  [place], [place] [phone] or [phone]   Substance Abuse Resources Organization         Address  Phone  Notes  [facility]  [phone]   [facility]  [phone]   [facility]  [phone]   Floydene Flock  [phone]   Residential & Outpatient Substance Abuse Program  [phone]   Psychological Services Organization         Address  Phone  Notes  [facility] Behavioral Health  [phone]   [employer] [facility]  [phone]   [facility] Mental Health [address], [place] [phone] or [phone]    Mobile Crisis Teams Organization         Address  Phone  Notes  [facility], Mobile Crisis Care Unit  [phone]   [facility]  [address] [place], [place] [phone]   Doristine Locks [address] [place] [phone]    Self-Help/Support Groups Organization         Address  Phone             Notes  [facility] - variety of support groups  336- I7437963 Call for more information  Narcotics Anonymous (NA), [facility] [address], [employer] [place]  2 meetings at this location   Statistician         Address  Phone  Notes  [facility] [address],    [place]  [phone]   [facility]  [address], [place] [address], [place], [place]  [phone]   [facility] [address] [place], [place] [phone] Admissions: 8am-3pm M-F  [facility] [address],    [place], [place] [phone]   [facility] [address] [place], [place], [place] [phone]   [facility] [address]  [place], [place] [phone]   [facility] - Intensive Outpatient [address] Laurell Josephs [address], [place], [place] [phone]   [facility] ([facility] [address] [place]  [place], [place] [phone] or [phone]   [facility] ([facility]) [address] [place], [place] [phone] Accepts Medicaid  [facility] [address]  [place] [phone] Substance Abuse/Addiction Treatment   [facility] Organization         Address  Phone  Notes  [facility]  [phone])[phone]   Angie Fava, PhD [address] Ervin Knack [place], [place]   [phone] or [phone]   [facility] Behavioral   [address] [place], [place] [phone]   [facility] [address] [place], [place] [phone] Insurance/Medicaid/sponsorship through [facility] and Families [address]                                    [place], [place] [phone] Therapy/tele-psych/case  [facility] [address], [place] [phone]    Dr. Lolly Mustache  [phone]   [facility]  [employer] [facility] Dept. 1) [address] [place] 2) [address], [place] 3)  [address], [place] [phone]  [phone]   [facility] Child Abuse Hotline 5515156913 or 7181951613 (After Hours)

## 2015-03-02 NOTE — ED Provider Notes (Signed)
CSN: [id number]     Arrival date & time 03/02/15  1026 History  This chart was scribed for non-physician practitioner, Oswaldo Conroy, PA-C, working with Rolland Porter, MD by Charline Bills, ED Scribe. This patient was seen in room TR10C/TR10C and the patient's care was started at 11:04 AM.   Chief Complaint  Patient presents with  . Dental Pain   The history is provided by the patient. No language interpreter was used.   HPI Comments: Sandra Foster is a 24 y.o. female, who is currently [redacted] weeks pregnant, who presents to the Emergency Department complaining of gradually worsening lower L dental pain for several days. Pt reports a whole in a tooth on the lower L side. Pt was seen by a dentist in [facility] but was told that they could not do anything to her tooth until she was at least [redacted] weeks pregnant. She states that she noticed a dental abscess and gum swelling 4 days ago. She denies drainage, drooling, fever, chills, nausea, vomiting, abdominal pain, vaginal bleeding, chest pain, SOB. Pt is currently getting prenatal care and taking prenatal vitamins. She has been treating with Tylenol and amoxicillin which she last took yesterday. Pt plans to follow-up with dentist in [facility] when she is 14 weeks.    Past Medical History  Diagnosis Date  . Hernia   . Chlamydia   . Pregnancy induced hypertension   . Pyelonephritis   . Fetal demise    Past Surgical History  Procedure Laterality Date  . Hernia repair     Family History  Problem Relation Age of Onset  . Hypertension Maternal Grandmother    History  Substance Use Topics  . Smoking status: Former Smoker -- 0.25 packs/day  . Smokeless tobacco: Former Neurosurgeon    Quit date: 06/10/2011  . Alcohol Use: No   OB History    Gravida Para Term Preterm AB TAB SAB Ectopic Multiple Living   0        Review of Systems  Constitutional: Negative for fever and chills.  HENT: Positive for dental problem. Negative for drooling.    Respiratory: Negative for shortness of breath.   Cardiovascular: Negative for chest pain.  Gastrointestinal: Negative for nausea, vomiting and abdominal pain.  Genitourinary: Negative for vaginal bleeding.   Allergies  Review of patient's allergies indicates no known allergies.  Home Medications   Prior to Admission medications   Medication Sig Start Date End Date Taking? Authorizing Provider  amoxicillin (AMOXIL) 500 MG capsule Take 1 capsule (500 mg total) by mouth 3 (three) times daily. 02/19/15   Hayden Rasmussen, NP  amoxicillin (AMOXIL) 500 MG capsule Take 1 capsule (500 mg total) by mouth 3 (three) times daily. 03/02/15   Oswaldo Conroy, PA-C  HYDROcodone-acetaminophen (NORCO/VICODIN) 5-325 MG per tablet 1/2 to 1 tablet q 4 to 6 h prn pain. 02/19/15   Hayden Rasmussen, NP   BP 110/65 mmHg  Pulse 71  Temp(Src) 98.2 F (36.8 C) (Oral)  Resp 18  SpO2 100%  LMP 12/06/2014 Physical Exam  Constitutional: She appears well-developed and well-nourished. No distress.  HENT:  Head: Normocephalic and atraumatic.  Mouth/Throat: Oropharynx is clear and moist. Dental abscesses present. No oropharyngeal exudate.  1 cm abscess to inner L lower gumline without evidence of cellulitis with mild gingiva erythema and tenderness. No trismus or uvula deviation. No lip, tongue, facial swelling. No swelling under tongue or tenderness. Patient handling secretions. No drooling. Patient with poor  dentition.  Eyes: Conjunctivae are normal. Right eye exhibits no discharge. Left eye exhibits no discharge.  Neck: Normal range of motion. Neck supple.  No neck masses or tenderness.  Cardiovascular: Normal rate and regular rhythm.   Pulmonary/Chest: Effort normal and breath sounds normal. No respiratory distress. She has no wheezes.  Abdominal: Soft. She exhibits no distension. There is no tenderness.  Lymphadenopathy:    She has no cervical adenopathy.  Neurological: She is alert. Coordination normal.  Skin: Skin  is warm and dry. She is not diaphoretic.  Nursing note and vitals reviewed.  ED Course  Procedures (including critical care time) DIAGNOSTIC STUDIES: Oxygen Saturation is 100% on RA, normal by my interpretation.    COORDINATION OF CARE: 11:08 AM-Discussed treatment plan which includes I&D with pt at bedside and pt agreed to plan.   INCISION AND DRAINAGE Performed by: Oswaldo Conroy, PA-C at 11:28 AM Consent: Verbal consent obtained. Risks and benefits: risks, benefits and alternatives were discussed Type: abscess  Body area: L lower gumline  Anesthesia: local infiltration  Incision was made with a scalpel.  Local anesthetic: lidocaine 2% without epinephrine   Anesthetic total: <1 ml  Complexity: complex Blunt dissection to break up loculations  Drainage: purulent  Drainage amount: mild to moderate   Patient tolerance: Patient tolerated the procedure well with no immediate complications.  Labs Review Labs Reviewed - No data to display  Imaging Review No results found.   EKG Interpretation None        MDM   Final diagnoses:  Dental abscess   Patient is [redacted] weeks pregnant presenting with left-sided lower dental abscess. She is currently on amoxicillin. She has scheduled appointment for tooth extraction in 4 weeks. VSS. No facial, tongue swelling. No drooling. No trismus or uvula deviation. Patient with abscess amenable to I and D. Abscess drained and patient tolerated the procedure well. Patient to continue taking amoxicillin and follow-up with the dentist. Tylenol, low dose as needed for pain.   Discussed return precautions with patient. Discussed all results and patient verbalizes understanding and agrees with plan.  I personally performed the services described in this documentation, which was scribed in my presence. The recorded information has been reviewed and is accurate.   Oswaldo Conroy, PA-C 03/02/15 1634  Rolland Porter, MD 03/06/15 [phone]

## 2015-03-02 NOTE — ED Notes (Signed)
Patient states started having dental pain x several days ago.   Patient states that she went to the dentist, but they advised they couldn't do anything with the tooth until patient was at least [redacted] wks pregnant.  Patient is currently [redacted] wks pregnant.   Patient states she now has swelling to L lower gumline near the back tooth.

## 2015-03-31 ENCOUNTER — Encounter (HOSPITAL_COMMUNITY): Payer: Self-pay | Admitting: *Deleted

## 2015-03-31 ENCOUNTER — Inpatient Hospital Stay (HOSPITAL_COMMUNITY)
Admission: AD | Admit: 2015-03-31 | Discharge: 2015-03-31 | Disposition: A | Discharge status: Home / Self Care | Payer: Medicaid Other | Source: Ambulatory Visit | Attending: Obstetrics | Admitting: Obstetrics

## 2015-03-31 ENCOUNTER — Inpatient Hospital Stay (HOSPITAL_COMMUNITY): Payer: Medicaid Other

## 2015-03-31 DIAGNOSIS — N10 Acute tubulo-interstitial nephritis: Secondary | ICD-10-CM

## 2015-03-31 DIAGNOSIS — Z87891 Personal history of nicotine dependence: Secondary | ICD-10-CM | POA: Diagnosis not present

## 2015-03-31 DIAGNOSIS — O26892 Other specified pregnancy related conditions, second trimester: Secondary | ICD-10-CM | POA: Diagnosis not present

## 2015-03-31 DIAGNOSIS — R109 Unspecified abdominal pain: Secondary | ICD-10-CM | POA: Diagnosis present

## 2015-03-31 DIAGNOSIS — Z3A15 15 weeks gestation of pregnancy: Secondary | ICD-10-CM

## 2015-03-31 DIAGNOSIS — M549 Dorsalgia, unspecified: Secondary | ICD-10-CM

## 2015-03-31 DIAGNOSIS — O99891 Other specified diseases and conditions complicating pregnancy: Secondary | ICD-10-CM

## 2015-03-31 DIAGNOSIS — O2302 Infections of kidney in pregnancy, second trimester: Secondary | ICD-10-CM | POA: Insufficient documentation

## 2015-03-31 DIAGNOSIS — Z3A16 16 weeks gestation of pregnancy: Secondary | ICD-10-CM | POA: Insufficient documentation

## 2015-03-31 DIAGNOSIS — O9989 Other specified diseases and conditions complicating pregnancy, childbirth and the puerperium: Secondary | ICD-10-CM

## 2015-03-31 LAB — CBC
HCT: 31.1 % — ABNORMAL LOW (ref 36.0–46.0)
Hemoglobin: 10.8 g/dL — ABNORMAL LOW (ref 12.0–15.0)
MCH: 29.9 pg (ref 26.0–34.0)
MCHC: 34.7 g/dL (ref 30.0–36.0)
MCV: 86.1 fL (ref 78.0–100.0)
PLATELETS: 154 10*3/uL (ref 150–400)
RBC: 3.61 MIL/uL — ABNORMAL LOW (ref 3.87–5.11)
RDW: 13.9 % (ref 11.5–15.5)
WBC: 10.5 10*3/uL (ref 4.0–10.5)

## 2015-03-31 LAB — URINALYSIS, ROUTINE W REFLEX MICROSCOPIC
BILIRUBIN URINE: NEGATIVE
Glucose, UA: NEGATIVE mg/dL
Ketones, ur: NEGATIVE mg/dL
Nitrite: NEGATIVE
PH: 6.5 (ref 5.0–8.0)
PROTEIN: NEGATIVE mg/dL
Specific Gravity, Urine: 1.01 (ref 1.005–1.030)
Urobilinogen, UA: 0.2 mg/dL (ref 0.0–1.0)

## 2015-03-31 LAB — COMPREHENSIVE METABOLIC PANEL
ALT: 15 U/L (ref 14–54)
AST: 14 U/L — ABNORMAL LOW (ref 15–41)
Albumin: 3.5 g/dL (ref 3.5–5.0)
Alkaline Phosphatase: 42 U/L (ref 38–126)
Anion gap: 4 — ABNORMAL LOW (ref 5–15)
BUN: 9 mg/dL (ref 6–20)
CHLORIDE: 106 mmol/L (ref 101–111)
CO2: 24 mmol/L (ref 22–32)
Calcium: 8.8 mg/dL — ABNORMAL LOW (ref 8.9–10.3)
Creatinine, Ser: 0.51 mg/dL (ref 0.44–1.00)
Glucose, Bld: 102 mg/dL — ABNORMAL HIGH (ref 65–99)
Potassium: 3.6 mmol/L (ref 3.5–5.1)
Sodium: 134 mmol/L — ABNORMAL LOW (ref 135–145)
TOTAL PROTEIN: 6.7 g/dL (ref 6.5–8.1)
Total Bilirubin: 0.2 mg/dL — ABNORMAL LOW (ref 0.3–1.2)

## 2015-03-31 LAB — OB RESULTS CONSOLE GC/CHLAMYDIA
Chlamydia: NEGATIVE
Gonorrhea: NEGATIVE

## 2015-03-31 LAB — URINE MICROSCOPIC-ADD ON

## 2015-03-31 LAB — OB RESULTS CONSOLE RPR: RPR: NONREACTIVE

## 2015-03-31 LAB — OB RESULTS CONSOLE ABO/RH: RH TYPE: POSITIVE

## 2015-03-31 LAB — OB RESULTS CONSOLE HIV ANTIBODY (ROUTINE TESTING): HIV: NONREACTIVE

## 2015-03-31 LAB — OB RESULTS CONSOLE ANTIBODY SCREEN: ANTIBODY SCREEN: NEGATIVE

## 2015-03-31 LAB — GLUCOSE, CAPILLARY: GLUCOSE-CAPILLARY: 80 mg/dL (ref 65–99)

## 2015-03-31 LAB — OB RESULTS CONSOLE RUBELLA ANTIBODY, IGM: RUBELLA: IMMUNE

## 2015-03-31 LAB — OB RESULTS CONSOLE HEPATITIS B SURFACE ANTIGEN: HEP B S AG: NEGATIVE

## 2015-03-31 MED ORDER — ACETAMINOPHEN-CODEINE #3 300-30 MG PO TABS: 1.0000 | ORAL_TABLET | Freq: Four times a day (QID) | ORAL | Status: DC | PRN

## 2015-03-31 MED ORDER — ONDANSETRON 8 MG PO TBDP: 8.0000 mg | ORAL_TABLET | Freq: Three times a day (TID) | ORAL | Status: DC | PRN

## 2015-03-31 MED ORDER — SODIUM CHLORIDE 0.9 % IV BOLUS (SEPSIS): 250.0000 mL | Freq: Once | INTRAVENOUS | Status: DC

## 2015-03-31 MED ORDER — NITROFURANTOIN MONOHYD MACRO 100 MG PO CAPS
100.0000 mg | ORAL_CAPSULE | Freq: Once | ORAL | Status: AC
  Administered 2015-03-31: 100 mg via ORAL
  Filled 2015-03-31: qty 1

## 2015-03-31 MED ORDER — CEFTRIAXONE SODIUM IN DEXTROSE 20 MG/ML IV SOLN
1.0000 g | Freq: Once | INTRAVENOUS | Status: AC
  Administered 2015-03-31: 1 g via INTRAVENOUS
  Filled 2015-03-31: qty 50

## 2015-03-31 MED ORDER — ONDANSETRON 8 MG PO TBDP
8.0000 mg | ORAL_TABLET | Freq: Once | ORAL | Status: AC
  Administered 2015-03-31: 8 mg via ORAL
  Filled 2015-03-31: qty 1

## 2015-03-31 MED ORDER — CYCLOBENZAPRINE HCL 10 MG PO TABS
10.0000 mg | ORAL_TABLET | Freq: Once | ORAL | Status: AC
  Administered 2015-03-31: 10 mg via ORAL
  Filled 2015-03-31: qty 1

## 2015-03-31 MED ORDER — CYCLOBENZAPRINE HCL 10 MG PO TABS: 10.0000 mg | ORAL_TABLET | Freq: Two times a day (BID) | ORAL | Status: DC | PRN

## 2015-03-31 MED ORDER — LACTATED RINGERS IV BOLUS (SEPSIS)
1000.0000 mL | Freq: Once | INTRAVENOUS | Status: AC
  Administered 2015-03-31: 1000 mL via INTRAVENOUS

## 2015-03-31 MED ORDER — NITROFURANTOIN MONOHYD MACRO 100 MG PO CAPS: 100.0000 mg | ORAL_CAPSULE | Freq: Two times a day (BID) | ORAL | Status: DC

## 2015-03-31 MED ORDER — SODIUM CHLORIDE 0.9 % IV SOLN
INTRAVENOUS | Status: AC
  Administered 2015-03-31: 19:00:00 via INTRAVENOUS
  Filled 2015-03-31: qty 100

## 2015-03-31 MED ORDER — CEPHALEXIN 500 MG PO CAPS: 500.0000 mg | ORAL_CAPSULE | Freq: Four times a day (QID) | ORAL | Status: DC

## 2015-03-31 NOTE — MAU Provider Note (Signed)
History     CSN: [id number]  Arrival date and time: 03/31/15 1202   First Provider Initiated Contact with Patient 03/31/15 1307      No chief complaint on file.  HPI  Pt is G3P1 who presents with left flank pain after had 3 hour sugar test this morning- pt has hx of gestational diabetes with previous pregnancy Pt ate some crackers and chips after GTT and felt  Pt denies fever, nausea , constipation or UTI sx. Pt previously had pain like this when had kidney infection several years ago.   Pt saw Dr. Gaynell Face yesterday.     MAU Note 03/31/2015 12:13 PM    Expand All Collapse All   About 45 min, had 3 hour sugar test at dr's office. Kidney's were hurting so walked up here. Now starting to feel light headed and is shaky       Past Medical History  Diagnosis Date  . Hernia   . Chlamydia   . Pregnancy induced hypertension   . Pyelonephritis   . Fetal demise     Past Surgical History  Procedure Laterality Date  . Hernia repair      Family History  Problem Relation Age of Onset  . Hypertension Maternal Grandmother     History  Substance Use Topics  . Smoking status: Former Smoker -- 0.25 packs/day  . Smokeless tobacco: Former Neurosurgeon    Quit date: 06/10/2011  . Alcohol Use: No    Allergies: No Known Allergies  Prescriptions prior to admission  Medication Sig Dispense Refill Last Dose  . amoxicillin (AMOXIL) 500 MG capsule Take 1 capsule (500 mg total) by mouth 3 (three) times daily. 21 capsule 0   . amoxicillin (AMOXIL) 500 MG capsule Take 1 capsule (500 mg total) by mouth 3 (three) times daily. 21 capsule 0   . HYDROcodone-acetaminophen (NORCO/VICODIN) 5-325 MG per tablet 1/2 to 1 tablet q 4 to 6 h prn pain. 15 tablet 0     Review of Systems  Constitutional: Positive for chills. Negative for fever.  Gastrointestinal: Positive for nausea, vomiting and abdominal pain. Negative for diarrhea and constipation.  Genitourinary: Positive for flank pain. Negative for  dysuria and urgency.  Neurological: Positive for dizziness.   Physical Exam   Blood pressure 111/59, pulse 97, temperature 98.4 F (36.9 C), temperature source Oral, resp. rate 20, height  (1.626 m), weight 156 lb (70.761 kg), last menstrual period 12/06/2014.  Physical Exam  Nursing note and vitals reviewed. Constitutional: She is oriented to person, place, and time. She appears well-developed and well-nourished. No distress.  HENT:  Head: Normocephalic.  Eyes: Pupils are equal, round, and reactive to light.  Neck: Normal range of motion. Neck supple.  Cardiovascular: Normal rate.   Respiratory: Effort normal.  Left CVA tenderness  GI: Soft. She exhibits no distension. There is no tenderness. There is no rebound and no guarding.  Musculoskeletal: Normal range of motion.  Neurological: She is alert and oriented to person, place, and time.  Skin: Skin is warm and dry.  Psychiatric: She has a normal mood and affect.    MAU Course  Procedures Results for orders placed or performed during the hospital encounter of 03/31/15 (from the past 24 hour(s))  Urinalysis, Routine w reflex microscopic (not at [facility])     Status: Abnormal   Collection Time: 03/31/15 12:20 PM  Result Value Ref Range   Color, Urine YELLOW YELLOW   APPearance HAZY (A) CLEAR   Specific Gravity, Urine  1.010 1.005 - 1.030   pH 6.5 5.0 - 8.0   Glucose, UA NEGATIVE NEGATIVE mg/dL   Hgb urine dipstick TRACE (A) NEGATIVE   Bilirubin Urine NEGATIVE NEGATIVE   Ketones, ur NEGATIVE NEGATIVE mg/dL   Protein, ur NEGATIVE NEGATIVE mg/dL   Urobilinogen, UA 0.2 0.0 - 1.0 mg/dL   Nitrite NEGATIVE NEGATIVE   Leukocytes, UA LARGE (A) NEGATIVE  Urine microscopic-add on     Status: Abnormal   Collection Time: 03/31/15 12:20 PM  Result Value Ref Range   Squamous Epithelial / LPF MANY (A) RARE   WBC, UA 3-6 <3 WBC/hpf   Bacteria, UA FEW (A) RARE  Glucose, capillary     Status: None   Collection Time: 03/31/15 12:25 PM   Result Value Ref Range   Glucose-Capillary 80 65 - 99 mg/dL  CBC     Status: Abnormal   Collection Time: 03/31/15  1:25 PM  Result Value Ref Range   WBC 10.5 4.0 - 10.5 K/uL   RBC 3.61 (L) 3.87 - 5.11 MIL/uL   Hemoglobin 10.8 (L) 12.0 - 15.0 g/dL   HCT 73.4 (L) 28.7 - 68.1 %   MCV 86.1 78.0 - 100.0 fL   MCH 29.9 26.0 - 34.0 pg   MCHC 34.7 30.0 - 36.0 g/dL   RDW 15.7 26.2 - 03.5 %   Platelets 154 150 - 400 K/uL   Results for orders placed or performed during the hospital encounter of 03/31/15 (from the past 24 hour(s))  Urinalysis, Routine w reflex microscopic (not at [facility])     Status: Abnormal   Collection Time: 03/31/15 12:20 PM  Result Value Ref Range   Color, Urine YELLOW YELLOW   APPearance HAZY (A) CLEAR   Specific Gravity, Urine 1.010 1.005 - 1.030   pH 6.5 5.0 - 8.0   Glucose, UA NEGATIVE NEGATIVE mg/dL   Hgb urine dipstick TRACE (A) NEGATIVE   Bilirubin Urine NEGATIVE NEGATIVE   Ketones, ur NEGATIVE NEGATIVE mg/dL   Protein, ur NEGATIVE NEGATIVE mg/dL   Urobilinogen, UA 0.2 0.0 - 1.0 mg/dL   Nitrite NEGATIVE NEGATIVE   Leukocytes, UA LARGE (A) NEGATIVE  Urine microscopic-add on     Status: Abnormal   Collection Time: 03/31/15 12:20 PM  Result Value Ref Range   Squamous Epithelial / LPF MANY (A) RARE   WBC, UA 3-6 <3 WBC/hpf   Bacteria, UA FEW (A) RARE  Glucose, capillary     Status: None   Collection Time: 03/31/15 12:25 PM  Result Value Ref Range   Glucose-Capillary 80 65 - 99 mg/dL  CBC     Status: Abnormal   Collection Time: 03/31/15  1:25 PM  Result Value Ref Range   WBC 10.5 4.0 - 10.5 K/uL   RBC 3.61 (L) 3.87 - 5.11 MIL/uL   Hemoglobin 10.8 (L) 12.0 - 15.0 g/dL   HCT 59.7 (L) 41.6 - 38.4 %   MCV 86.1 78.0 - 100.0 fL   MCH 29.9 26.0 - 34.0 pg   MCHC 34.7 30.0 - 36.0 g/dL   RDW 53.6 46.8 - 03.2 %   Platelets 154 150 - 400 K/uL  Comprehensive metabolic panel     Status: Abnormal   Collection Time: 03/31/15  1:25 PM  Result Value Ref Range   Sodium  134 (L) 135 - 145 mmol/L   Potassium 3.6 3.5 - 5.1 mmol/L   Chloride 106 101 - 111 mmol/L   CO2 24 22 - 32 mmol/L   Glucose, Bld 102 (  H) 65 - 99 mg/dL   BUN 9 6 - 20 mg/dL   Creatinine, Ser 1.61 0.44 - 1.00 mg/dL   Calcium 8.8 (L) 8.9 - 10.3 mg/dL   Total Protein 6.7 6.5 - 8.1 g/dL   Albumin 3.5 3.5 - 5.0 g/dL   AST 14 (L) 15 - 41 U/L   ALT 15 14 - 54 U/L   Alkaline Phosphatase 42 38 - 126 U/L   Total Bilirubin 0.2 (L) 0.3 - 1.2 mg/dL   GFR calc non Af Amer >60 >60 mL/min   GFR calc Af Amer >60 >60 mL/min   Anion gap 4 (L) 5 - 15   Discussed with Dr. Gaynell Face- will treat with Macrobid while culture is in process and give flexeril for back pain Pt threw up and felt hot and dizzy when getting ready to leave- felt like she was going to pass out Zofran ordered Pt threw up again and is feeling worse; started spotting after went to bathroom Will get renal ultrasound and OB limited and  and start LR bolus Pt feeling better after LR bolus- less pain and nausea Urine culture  Renal ultrasound and limited pelvic US Renal  03/31/2015   CLINICAL DATA:  Left flank pain.  Pregnancy.  EXAM: RENAL / URINARY TRACT ULTRASOUND COMPLETE  COMPARISON:  None.  FINDINGS: Right Kidney:  Length: 12.7 cm. Echogenicity within normal limits. Borderline hydronephrosis and mild proximal hydroureter.  Left Kidney:  Length: 12.7 cm. Echogenicity within normal limits. Mild hydronephrosis and prominent hydroureter, measuring about 3.5 cm in diameter proximally.  Bladder:  Appears normal for degree of bladder distention. Bilateral ureteral jets are present.  IMPRESSION: 1. Prominent hydroureter and mild hydronephrosis on the left side. The cause of the obstruction is not visualize; ureteral jets were visible bilaterally and accordingly the presumptive obstruction may be transient/intermittent, or partial. 2. There is borderline right hydronephrosis and mild right proximal hydroureter as well.   Electronically Signed   By:  Gaylyn Rong M.D.   On: 03/31/2015 17:43  discussed with Dr. Gaynell Face- will Rx with Keflex and Tylenol Rocepehin 1 gm IV given  Assessment and Plan  Pyleonephritis in pregnancy- Keflex  QID; tylenol#3 1-2   zofran  ODT for nausea F/u with Dr. Gaynell Face next week- sooner if increase in pain or fever Sandra Foster 03/31/2015, 1:08 PM

## 2015-03-31 NOTE — Progress Notes (Signed)
At time of discharge patient vomited moderate amount. NP notified. Orders to follow.

## 2015-03-31 NOTE — MAU Note (Signed)
About 45 min, had 3 hour sugar test at dr's office. Kidney's were hurting so walked up here. Now starting to feel light headed and is shaky

## 2015-04-01 LAB — CULTURE, OB URINE

## 2015-05-03 ENCOUNTER — Other Ambulatory Visit (HOSPITAL_COMMUNITY): Payer: Self-pay | Admitting: Obstetrics

## 2015-05-03 DIAGNOSIS — R739 Hyperglycemia, unspecified: Secondary | ICD-10-CM

## 2015-05-03 DIAGNOSIS — O09292 Supervision of pregnancy with other poor reproductive or obstetric history, second trimester: Secondary | ICD-10-CM

## 2015-05-03 DIAGNOSIS — IMO0002 Reserved for concepts with insufficient information to code with codable children: Secondary | ICD-10-CM

## 2015-05-03 DIAGNOSIS — Z0489 Encounter for examination and observation for other specified reasons: Secondary | ICD-10-CM

## 2015-05-04 ENCOUNTER — Ambulatory Visit (HOSPITAL_COMMUNITY)
Admission: RE | Admit: 2015-05-04 | Discharge: 2015-05-04 | Disposition: A | Discharge status: Home / Self Care | Payer: Medicaid Other | Source: Ambulatory Visit | Attending: Obstetrics | Admitting: Obstetrics

## 2015-05-04 DIAGNOSIS — Z36 Encounter for antenatal screening of mother: Secondary | ICD-10-CM | POA: Diagnosis not present

## 2015-05-04 DIAGNOSIS — O09292 Supervision of pregnancy with other poor reproductive or obstetric history, second trimester: Secondary | ICD-10-CM | POA: Diagnosis not present

## 2015-05-04 DIAGNOSIS — Z3A20 20 weeks gestation of pregnancy: Secondary | ICD-10-CM | POA: Insufficient documentation

## 2015-05-04 DIAGNOSIS — Z0489 Encounter for examination and observation for other specified reasons: Secondary | ICD-10-CM

## 2015-05-04 DIAGNOSIS — IMO0002 Reserved for concepts with insufficient information to code with codable children: Secondary | ICD-10-CM

## 2015-05-04 DIAGNOSIS — R739 Hyperglycemia, unspecified: Secondary | ICD-10-CM

## 2015-05-04 DIAGNOSIS — Z3689 Encounter for other specified antenatal screening: Secondary | ICD-10-CM | POA: Insufficient documentation

## 2015-05-06 ENCOUNTER — Ambulatory Visit (HOSPITAL_COMMUNITY)
Admission: RE | Admit: 2015-05-06 | Discharge: 2015-05-06 | Disposition: A | Discharge status: Home / Self Care | Payer: Medicaid Other | Source: Ambulatory Visit | Attending: Obstetrics | Admitting: Obstetrics

## 2015-05-06 ENCOUNTER — Other Ambulatory Visit (HOSPITAL_COMMUNITY): Payer: Self-pay | Admitting: Maternal and Fetal Medicine

## 2015-05-06 ENCOUNTER — Encounter (HOSPITAL_COMMUNITY): Payer: Self-pay

## 2015-05-06 DIAGNOSIS — Z87891 Personal history of nicotine dependence: Secondary | ICD-10-CM | POA: Insufficient documentation

## 2015-05-06 DIAGNOSIS — R739 Hyperglycemia, unspecified: Secondary | ICD-10-CM | POA: Insufficient documentation

## 2015-05-06 DIAGNOSIS — O9989 Other specified diseases and conditions complicating pregnancy, childbirth and the puerperium: Secondary | ICD-10-CM | POA: Insufficient documentation

## 2015-05-06 DIAGNOSIS — Z3A20 20 weeks gestation of pregnancy: Secondary | ICD-10-CM | POA: Diagnosis not present

## 2015-05-06 DIAGNOSIS — O09299 Supervision of pregnancy with other poor reproductive or obstetric history, unspecified trimester: Secondary | ICD-10-CM

## 2015-05-06 NOTE — Consult Note (Signed)
Maternal Fetal Medicine Consultation  Requesting Provider(s): Francoise Ceo, MD  Reason for consultation: Hx of 39 week IUFD, hyperosmotic/ hyperglycemic state  HPI: Sandra Foster is a 24 year-old G3P1020, EDD 09/21/2015 who is currently at 20w 2d seen for consultation due to history of a 39 week IUFD felt to be secondary to hyperglycemic/ hyperosmotic state.  Sandra Foster reports that in 2012, she had an elevated 1- hr OGTT with a subsequent normal 3-hr test at 26 weeks during that pregnancy.  At 39 weeks, she was seen in MAU and noted to have blood sugars in the 800 range.  About a week prior to admission, she reported polyuria and polydypsia - reported drinking "lots of sugar water".  At the time of diagnosis, she was noted to have an IUFD and subsequently underwent a misoprostol induction without complications.  She was admitted to the ICU for approximately 1 week.  Sandra Foster reports that her blood sugars returned to normal after her delivery and that she has not required any additional treatment.  Her father "is making her check blood sugars at home".  Her prenatal course thus far has otherwise been uncomplicated.  She is without complaints today.  OB History: OB History    Gravida Para Term Preterm AB TAB SAB Ectopic Multiple Living   0         PMH:  Past Medical History  Diagnosis Date  . Hernia   . Chlamydia   . Pregnancy induced hypertension   . Pyelonephritis   . Fetal demise     PSH:  Past Surgical History  Procedure Laterality Date  . Hernia repair     Meds:  Current Outpatient Prescriptions on File Prior to Encounter  Medication Sig Dispense Refill  . acetaminophen-codeine (TYLENOL #3) 300-30 MG per tablet Take 1-2 tablets by mouth every 6 (six) hours as needed for moderate pain. 15 tablet 0  . cephALEXin (KEFLEX) 500 MG capsule Take 1 capsule (500 mg total) by mouth 4 (four) times daily. 40 capsule 0  . cyclobenzaprine (FLEXERIL) 10 MG tablet  Take 1 tablet (10 mg total) by mouth 2 (two) times daily as needed for muscle spasms. 20 tablet 0  . nitrofurantoin, macrocrystal-monohydrate, (MACROBID) 100 MG capsule Take 1 capsule (100 mg total) by mouth 2 (two) times daily. 10 capsule 0  . ondansetron (ZOFRAN ODT) 8 MG disintegrating tablet Take 1 tablet (8 mg total) by mouth every 8 (eight) hours as needed for nausea or vomiting. 20 tablet 0   No current facility-administered medications on file prior to encounter.   Allergies: No Known Allergies   FH:  Family History  Problem Relation Age of Onset  . Hypertension Maternal Grandmother    Soc:  History   Social History  . Marital Status: Single    Spouse Name: N/A  . Number of Children: N/A  . Years of Education: N/A   Occupational History  . Not on file.   Social History Main Topics  . Smoking status: Former Smoker -- 0.25 packs/day  . Smokeless tobacco: Former Neurosurgeon    Quit date: 06/10/2011  . Alcohol Use: No  . Drug Use: No  . Sexual Activity: Yes    Birth Control/ Protection: None   Other Topics Concern  . Not on file   Social History Narrative   Review of Systems: no vaginal bleeding or cramping/contractions, no LOF, no nausea/vomiting. All other systems reviewed and are negative.  PE:  Filed Vitals:   05/06/15 1017  BP: 109/63  Pulse: 86     A/P: 1) Single IUP at 20w 2d  2) Hx of HHS (hyperosmotic, hyperglycemic state) - felt to have been the cause of the patient's previous IUFD at 39 weeks.  Since that single episode, the patient has been euglycemic.  Early diabetes screening was negative this pregnancy.  HHS is usually described in older patient with poorly controlled type 2 diabetes, but has been reported in adolescents with previously undiagnosed diabetes.  Pregnancy may be a predisposing factor.  Nevertheless, it would seem very unusual to later have normal blood sugars without evidence of type 2 diabetes.  Given the patient's history, I would  recommend that she check fasting and post prandial blood sugars for the remainder of her pregnancy - even though she does not have the diagnosis of GDM at this time.  If her blood sugars are elevated (Fasting > 100, 2-hr post prandial values consistently > 130) would have a low threshold to begin treatment.  Would do this rather than any additional screening tests.  Recommendations:  1) Recommend that the patient meet with our Diabetic Educator to be instructed on diabetic diet, glucometer and fingerstick instructions. 2) Would follow fasting and 2-hr post prandial fingersticks for the remainder of the patients pregnancy rather than additional diabetes screening.  Would begin treatment if fasting or post-prandial values are consistently above the target range. 3) If not recently preformed, recommend checking a HbA1C.  Would check each trimester during the pregnancy. 4) The patient is scheduled for a follow up ultrasound at approximately 28 weeks; recommend serial growth scans every 4 weeks thereafter 5) Recommend antenatal testing (2x weekly NSTs with weekly AFIs) beginning at 32 weeks 6) Delivery by 39 weeks in the absence of other complications   Thank you for the opportunity to be a part of the care of Sandra Foster. Please contact our office if we can be of further assistance.   I spent approximately 30 minutes with this patient with over 50% of time spent in face-to-face counseling.  Alpha Gula, MD Maternal Fetal Medicine

## 2015-05-12 ENCOUNTER — Ambulatory Visit (HOSPITAL_COMMUNITY)
Admission: RE | Admit: 2015-05-12 | Discharge: 2015-05-12 | Disposition: A | Discharge status: Home / Self Care | Payer: Medicaid Other | Source: Ambulatory Visit | Attending: Obstetrics | Admitting: Obstetrics

## 2015-05-12 ENCOUNTER — Encounter: Payer: Medicaid Other | Attending: Obstetrics | Admitting: *Deleted

## 2015-05-12 DIAGNOSIS — O24410 Gestational diabetes mellitus in pregnancy, diet controlled: Secondary | ICD-10-CM | POA: Insufficient documentation

## 2015-05-12 DIAGNOSIS — Z713 Dietary counseling and surveillance: Secondary | ICD-10-CM | POA: Diagnosis not present

## 2015-05-12 NOTE — Progress Notes (Signed)
  Patient was seen on 05/12/15 for Gestational Diabetes self-management . The following learning objectives were met by the patient :   States the definition of Gestational Diabetes  States why dietary management is important in controlling blood glucose  Describes the effects of carbohydrates on blood glucose levels  Demonstrates ability to create a balanced meal plan  Demonstrates carbohydrate counting   States when to check blood glucose levels  Demonstrates proper blood glucose monitoring techniques  States the effect of stress and exercise on blood glucose levels  States the importance of limiting caffeine and abstaining from alcohol and smoking  Plan:  Aim for 2 Carb Choices per meal (30 grams) +/- 1 either way for breakfast Aim for 3 Carb Choices per meal (45 grams) +/- 1 either way from lunch and dinner Aim for 1-2 Carbs per snack Begin reading food labels for Total Carbohydrate and sugar grams of foods Consider  increasing your activity level by walking daily as tolerated Begin checking BG before breakfast and 1-2 hours after first bit of breakfast, lunch and dinner after  as directed by MD  Take medication  as directed by MD  Blood glucose monitor given: [employer] Lot # [id number] Exp: 02/26/2016 Blood glucose reading: $RemoveBeforeDE'124mg'[id number]$ /dl  Patient instructed to monitor glucose levels: FBS: 60 - <90 2 hour: <120  Patient received the following handouts:  Nutrition Diabetes and Pregnancy  Carbohydrate Counting List  Meal Planning worksheet  Patient will be seen for follow-up as needed.

## 2015-05-12 NOTE — Addendum Note (Signed)
Encounter addended by: Rosendo Gros, RN on: 05/12/2015  4:08 PM<BR>     Documentation filed: Medications

## 2015-06-08 ENCOUNTER — Encounter (HOSPITAL_COMMUNITY): Payer: Self-pay | Admitting: *Deleted

## 2015-06-08 ENCOUNTER — Inpatient Hospital Stay (HOSPITAL_COMMUNITY)
Admission: AD | Admit: 2015-06-08 | Discharge: 2015-06-08 | Disposition: A | Discharge status: Home / Self Care | Payer: Medicaid Other | Source: Ambulatory Visit | Attending: Obstetrics | Admitting: Obstetrics

## 2015-06-08 DIAGNOSIS — Z3A25 25 weeks gestation of pregnancy: Secondary | ICD-10-CM | POA: Insufficient documentation

## 2015-06-08 DIAGNOSIS — O26899 Other specified pregnancy related conditions, unspecified trimester: Secondary | ICD-10-CM

## 2015-06-08 DIAGNOSIS — R109 Unspecified abdominal pain: Secondary | ICD-10-CM | POA: Insufficient documentation

## 2015-06-08 DIAGNOSIS — Z87891 Personal history of nicotine dependence: Secondary | ICD-10-CM | POA: Diagnosis not present

## 2015-06-08 DIAGNOSIS — O9989 Other specified diseases and conditions complicating pregnancy, childbirth and the puerperium: Secondary | ICD-10-CM | POA: Insufficient documentation

## 2015-06-08 LAB — URINE MICROSCOPIC-ADD ON

## 2015-06-08 LAB — URINALYSIS, ROUTINE W REFLEX MICROSCOPIC
BILIRUBIN URINE: NEGATIVE
GLUCOSE, UA: NEGATIVE mg/dL
KETONES UR: NEGATIVE mg/dL
Nitrite: NEGATIVE
Protein, ur: NEGATIVE mg/dL
Specific Gravity, Urine: 1.02 (ref 1.005–1.030)
Urobilinogen, UA: 0.2 mg/dL (ref 0.0–1.0)
pH: 6.5 (ref 5.0–8.0)

## 2015-06-08 NOTE — MAU Note (Signed)
Pt stated she woke up with a sharp pain in the middle of her stomach that is constant. Has not felt baby move much today. Denies ROM or bleeding.

## 2015-06-08 NOTE — MAU Provider Note (Signed)
History     CSN: [id number]  Arrival date and time: 06/08/15 8502   First Provider Initiated Contact with Patient 06/08/15 0945      Chief Complaint  Patient presents with  . Abdominal Pain   HPI Sandra Foster 24 y.o. G3P1010 $RemoveBefo'@[redacted]w[redacted]d'OKSKSdpkXvT$  presents to MAU complaining of abdomnial pain.  She noted the pain at 7:53am today when she woke up to her alarm clock.  It just felt like a hardness across the top of her belly.  She was not feeling the baby move.  Since arrival in MAU, the pain is gone and the baby is moving well.  Blood sugars have been good.  She is eating and drinking well as well as good bathroom habits.   OB History    Gravida Para Term Preterm AB TAB SAB Ectopic Multiple Living   '3 1 1  1 1 '$ 0         Past Medical History  Diagnosis Date  . Hernia   . Chlamydia   . Pregnancy induced hypertension   . Pyelonephritis   . Fetal demise     Past Surgical History  Procedure Laterality Date  . Hernia repair      Family History  Problem Relation Age of Onset  . Hypertension Maternal Grandmother     Social History  Substance Use Topics  . Smoking status: Former Smoker -- 0.25 packs/day  . Smokeless tobacco: Former Systems developer    Quit date: 06/10/2011  . Alcohol Use: No    Allergies: No Known Allergies  Prescriptions prior to admission  Medication Sig Dispense Refill Last Dose  . acetaminophen-codeine (TYLENOL #3) 300-30 MG per tablet Take 1-2 tablets by mouth every 6 (six) hours as needed for moderate pain. 15 tablet 0   . Blood Glucose Monitoring Suppl (ACCU-CHEK AVIVA CONNECT) W/DEVICE KIT 1 each by Does not apply route 4 (four) times daily.     . cephALEXin (KEFLEX) 500 MG capsule Take 1 capsule (500 mg total) by mouth 4 (four) times daily. 40 capsule 0   . cyclobenzaprine (FLEXERIL) 10 MG tablet Take 1 tablet (10 mg total) by mouth 2 (two) times daily as needed for muscle spasms. 20 tablet 0   . glucose blood (ACCU-CHEK AVIVA PLUS) test strip 1 each by Other route as  needed for other. Use as instructed 4 times daily   Taking  . nitrofurantoin, macrocrystal-monohydrate, (MACROBID) 100 MG capsule Take 1 capsule (100 mg total) by mouth 2 (two) times daily. 10 capsule 0   . ondansetron (ZOFRAN ODT) 8 MG disintegrating tablet Take 1 tablet (8 mg total) by mouth every 8 (eight) hours as needed for nausea or vomiting. 20 tablet 0     ROS Pertinent ROS in HPI.  All other systems are negative.   Physical Exam   Blood pressure 112/56, pulse 95, temperature 98.7 F (37.1 C), temperature source Oral, resp. rate 18, height $RemoveBe'5\' 7"'SxQYoeuDN$  (1.702 m), weight 168 lb 12.8 oz (76.567 kg), last menstrual period 12/06/2014.  Physical Exam  Constitutional: She is oriented to person, place, and time. She appears well-developed and well-nourished. No distress.  HENT:  Head: Normocephalic and atraumatic.  Eyes: EOM are normal.  Neck: Normal range of motion.  Cardiovascular: Normal rate, regular rhythm and normal heart sounds.   Respiratory: Breath sounds normal. No respiratory distress.  GI: Soft. Bowel sounds are normal. She exhibits no distension.  Musculoskeletal: Normal range of motion.  Neurological: She is alert and oriented to person, place, and  time.  Skin: Skin is warm and dry.  Psychiatric: She has a normal mood and affect.   Fetal Tracing: Baseline:140s Variability:mod Accelerations: 10x10 Decelerations:variables noted Toco:none   MAU Course  Procedures  Fetal tracing with variables  MDM Pain gone.  Baby moving.   Dr. Ruthann Cancer consulted and aware of variables in fetal monitoring.  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   He advises for no further eval at this time.  Pt is under care of MFM and is to continue close monitoring with them.    Assessment and Plan  A:  1. Abdominal pain in pregnancy    P: Discharge to home  Keep MFM appts- next is next week Patient may return to MAU as needed or if her  condition were to change or worsen   [name] 06/08/2015, 9:47 AM

## 2015-06-08 NOTE — Discharge Instructions (Signed)

## 2015-06-30 ENCOUNTER — Encounter (HOSPITAL_COMMUNITY): Payer: Self-pay

## 2015-06-30 ENCOUNTER — Ambulatory Visit (HOSPITAL_COMMUNITY)
Admission: RE | Admit: 2015-06-30 | Discharge: 2015-06-30 | Disposition: A | Discharge status: Home / Self Care | Payer: Medicaid Other | Source: Ambulatory Visit | Attending: Obstetrics | Admitting: Obstetrics

## 2015-06-30 ENCOUNTER — Other Ambulatory Visit (HOSPITAL_COMMUNITY): Payer: Self-pay | Admitting: Maternal and Fetal Medicine

## 2015-06-30 DIAGNOSIS — O09299 Supervision of pregnancy with other poor reproductive or obstetric history, unspecified trimester: Secondary | ICD-10-CM

## 2015-06-30 DIAGNOSIS — Z3A28 28 weeks gestation of pregnancy: Secondary | ICD-10-CM

## 2015-06-30 DIAGNOSIS — O09293 Supervision of pregnancy with other poor reproductive or obstetric history, third trimester: Secondary | ICD-10-CM | POA: Diagnosis not present

## 2015-06-30 DIAGNOSIS — O10913 Unspecified pre-existing hypertension complicating pregnancy, third trimester: Secondary | ICD-10-CM | POA: Insufficient documentation

## 2015-06-30 DIAGNOSIS — O163 Unspecified maternal hypertension, third trimester: Secondary | ICD-10-CM

## 2015-07-01 ENCOUNTER — Other Ambulatory Visit (HOSPITAL_COMMUNITY): Payer: Self-pay | Admitting: *Deleted

## 2015-07-01 DIAGNOSIS — O09299 Supervision of pregnancy with other poor reproductive or obstetric history, unspecified trimester: Secondary | ICD-10-CM

## 2015-07-11 ENCOUNTER — Other Ambulatory Visit (HOSPITAL_COMMUNITY): Payer: Self-pay | Admitting: Obstetrics

## 2015-07-28 ENCOUNTER — Other Ambulatory Visit (HOSPITAL_COMMUNITY): Payer: Self-pay | Admitting: Maternal and Fetal Medicine

## 2015-07-28 ENCOUNTER — Ambulatory Visit (HOSPITAL_COMMUNITY)
Admission: RE | Admit: 2015-07-28 | Discharge: 2015-07-28 | Disposition: A | Discharge status: Home / Self Care | Payer: Medicaid Other | Source: Ambulatory Visit | Attending: Maternal and Fetal Medicine | Admitting: Maternal and Fetal Medicine

## 2015-07-28 ENCOUNTER — Encounter (HOSPITAL_COMMUNITY): Payer: Self-pay

## 2015-07-28 DIAGNOSIS — O09299 Supervision of pregnancy with other poor reproductive or obstetric history, unspecified trimester: Secondary | ICD-10-CM

## 2015-07-28 DIAGNOSIS — O09899 Supervision of other high risk pregnancies, unspecified trimester: Secondary | ICD-10-CM

## 2015-07-28 DIAGNOSIS — O09293 Supervision of pregnancy with other poor reproductive or obstetric history, third trimester: Secondary | ICD-10-CM | POA: Diagnosis present

## 2015-07-28 DIAGNOSIS — Z3A32 32 weeks gestation of pregnancy: Secondary | ICD-10-CM | POA: Diagnosis not present

## 2015-07-28 NOTE — ED Notes (Signed)
Blood sugar log copied, scanned to chart and reviewed by Dr. Claudean Severance.  Per Dr. Claudean Severance increase glyburide to 5mg  every night and 2.5mg  every morning.  Prescription given to pt.

## 2015-08-04 ENCOUNTER — Ambulatory Visit (HOSPITAL_COMMUNITY)
Admission: RE | Admit: 2015-08-04 | Payer: Medicaid Other | Source: Ambulatory Visit | Attending: Maternal and Fetal Medicine | Admitting: Maternal and Fetal Medicine

## 2015-08-11 ENCOUNTER — Encounter (HOSPITAL_COMMUNITY): Payer: Self-pay

## 2015-08-11 ENCOUNTER — Ambulatory Visit (HOSPITAL_COMMUNITY)
Admission: RE | Admit: 2015-08-11 | Discharge: 2015-08-11 | Disposition: A | Discharge status: Home / Self Care | Payer: Medicaid Other | Source: Ambulatory Visit | Attending: Maternal and Fetal Medicine | Admitting: Maternal and Fetal Medicine

## 2015-08-11 ENCOUNTER — Inpatient Hospital Stay (HOSPITAL_COMMUNITY)
Admission: AD | Admit: 2015-08-11 | Discharge: 2015-08-11 | Disposition: A | Discharge status: Home / Self Care | Payer: Medicaid Other | Source: Ambulatory Visit | Attending: Obstetrics | Admitting: Obstetrics

## 2015-08-11 ENCOUNTER — Ambulatory Visit (HOSPITAL_COMMUNITY): Payer: Medicaid Other

## 2015-08-11 ENCOUNTER — Other Ambulatory Visit (HOSPITAL_COMMUNITY): Payer: Self-pay | Admitting: Maternal and Fetal Medicine

## 2015-08-11 ENCOUNTER — Encounter (HOSPITAL_COMMUNITY): Payer: Self-pay | Admitting: *Deleted

## 2015-08-11 DIAGNOSIS — O35EXX0 Maternal care for other (suspected) fetal abnormality and damage, fetal genitourinary anomalies, not applicable or unspecified: Secondary | ICD-10-CM

## 2015-08-11 DIAGNOSIS — O133 Gestational [pregnancy-induced] hypertension without significant proteinuria, third trimester: Secondary | ICD-10-CM

## 2015-08-11 DIAGNOSIS — O358XX0 Maternal care for other (suspected) fetal abnormality and damage, not applicable or unspecified: Secondary | ICD-10-CM

## 2015-08-11 DIAGNOSIS — O283 Abnormal ultrasonic finding on antenatal screening of mother: Secondary | ICD-10-CM | POA: Insufficient documentation

## 2015-08-11 DIAGNOSIS — O26893 Other specified pregnancy related conditions, third trimester: Secondary | ICD-10-CM | POA: Insufficient documentation

## 2015-08-11 DIAGNOSIS — R102 Pelvic and perineal pain: Secondary | ICD-10-CM | POA: Diagnosis not present

## 2015-08-11 DIAGNOSIS — R109 Unspecified abdominal pain: Secondary | ICD-10-CM

## 2015-08-11 DIAGNOSIS — O09299 Supervision of pregnancy with other poor reproductive or obstetric history, unspecified trimester: Secondary | ICD-10-CM | POA: Insufficient documentation

## 2015-08-11 DIAGNOSIS — O24414 Gestational diabetes mellitus in pregnancy, insulin controlled: Secondary | ICD-10-CM

## 2015-08-11 DIAGNOSIS — O9989 Other specified diseases and conditions complicating pregnancy, childbirth and the puerperium: Secondary | ICD-10-CM

## 2015-08-11 DIAGNOSIS — Z87891 Personal history of nicotine dependence: Secondary | ICD-10-CM | POA: Insufficient documentation

## 2015-08-11 DIAGNOSIS — Z3A34 34 weeks gestation of pregnancy: Secondary | ICD-10-CM | POA: Insufficient documentation

## 2015-08-11 DIAGNOSIS — O26899 Other specified pregnancy related conditions, unspecified trimester: Secondary | ICD-10-CM

## 2015-08-11 DIAGNOSIS — N949 Unspecified condition associated with female genital organs and menstrual cycle: Secondary | ICD-10-CM

## 2015-08-11 LAB — URINALYSIS, ROUTINE W REFLEX MICROSCOPIC
Bilirubin Urine: NEGATIVE
Hgb urine dipstick: NEGATIVE
Ketones, ur: NEGATIVE mg/dL
Nitrite: NEGATIVE
PH: 6 (ref 5.0–8.0)
PROTEIN: NEGATIVE mg/dL
Specific Gravity, Urine: <1.005 — ABNORMAL LOW (ref 1.005–1.030)
Urobilinogen, UA: 0.2 mg/dL (ref 0.0–1.0)

## 2015-08-11 LAB — URINE MICROSCOPIC-ADD ON

## 2015-08-11 NOTE — MAU Provider Note (Signed)
History     CSN: [id number]  Arrival date and time: 08/11/15 2052   First Provider Initiated Contact with Patient 08/11/15 2148      Chief Complaint  Patient presents with  . Abdominal Cramping   HPI Comments: Sandra Foster is a 24 y.o. G3P1010 at [redacted]w[redacted]d who presents today with cramping. She denies any vaginal bleeding or LOF. She states that the fetus has been active.   Abdominal Cramping This is a new problem. The current episode started today. The onset quality is gradual. The problem occurs constantly. The problem has been gradually worsening. The pain is located in the suprapubic region. The pain is at a severity of 7/10. The quality of the pain is cramping. Pain radiation: down legs. Pertinent negatives include no constipation, diarrhea, dysuria, fever, frequency, nausea or vomiting. The pain is aggravated by certain positions. The pain is relieved by nothing. She has tried nothing for the symptoms.     Past Medical History  Diagnosis Date  . Hernia   . Chlamydia   . Pregnancy induced hypertension   . Pyelonephritis   . Fetal demise     Past Surgical History  Procedure Laterality Date  . Hernia repair      Family History  Problem Relation Age of Onset  . Hypertension Maternal Grandmother     Social History  Substance Use Topics  . Smoking status: Former Smoker -- 0.25 packs/day  . Smokeless tobacco: Former Systems developer    Quit date: 06/10/2011  . Alcohol Use: No    Allergies: No Known Allergies  Prescriptions prior to admission  Medication Sig Dispense Refill Last Dose  . Blood Glucose Monitoring Suppl (ACCU-CHEK AVIVA CONNECT) W/DEVICE KIT 1 each by Does not apply route 4 (four) times daily.   Taking  . ferrous sulfate 325 (65 FE) MG tablet Take 325 mg by mouth daily with breakfast.   08/11/2015 at 0800  . glucose blood (ACCU-CHEK AVIVA PLUS) test strip 1 each by Other route as needed for other. Use as instructed 4 times daily   Taking  . glyBURIDE (DIABETA) 5  MG tablet Take 2.5-5 mg by mouth 2 (two) times daily with a meal. Take 1/2 tablet by mouth in the morning and whole tablet by mouth in the evening.   08/11/2015 at 0800  . NIFEdipine (PROCARDIA XL/ADALAT-CC) 60 MG 24 hr tablet Take 60 mg by mouth daily.   08/11/2015 at 0800  . Prenatal Vit-Fe Fumarate-FA (PRENATAL MULTIVITAMIN) TABS tablet Take 1 tablet by mouth daily at 12 noon.   08/11/2015 at 0800    Review of Systems  Constitutional: Negative for fever.  Gastrointestinal: Positive for abdominal pain. Negative for nausea, vomiting, diarrhea and constipation.  Genitourinary: Negative for dysuria, urgency and frequency.   Physical Exam   Blood pressure 117/62, pulse 98, temperature 98.2 F (36.8 C), temperature source Oral, resp. rate 20, height $RemoveBe'5\' 4"'BPhxLOhHK$  (1.626 m), weight 184 lb 8 oz (83.689 kg), last menstrual period 12/06/2014.  Physical Exam  Nursing note and vitals reviewed. Constitutional: She is oriented to person, place, and time. She appears well-developed and well-nourished. No distress.  HENT:  Head: Normocephalic.  Cardiovascular: Normal rate.   Respiratory: Effort normal.  GI: Soft. There is no tenderness. There is no rebound.  Genitourinary:  Cervix: closed/thick/high   Neurological: She is alert and oriented to person, place, and time.  Skin: Skin is warm and dry.  Psychiatric: She has a normal mood and affect.   FHT: 120, moderate with  15x15 accels, no decels  Toco: no UCs  MAU Course  Procedures  MDM   Assessment and Plan   1. Abdominal pain affecting pregnancy   2. Round ligament pain    DC home Comfort measures reviewed  3rd Trimester precautions  PTL precautions  Fetal kick counts RX: none  Return to MAU as needed FU with OB as planned  Follow-up Information    Follow up with Frederico Hamman, MD.   Specialty:  Obstetrics and Gynecology   Why:  As scheduled   Contact information:   [address] [place]  [address] [phone]      [name] 08/11/2015, 9:58 PM

## 2015-08-11 NOTE — ED Notes (Signed)
Blood sugar log copied, scanned to chart and reviewed by MD.

## 2015-08-11 NOTE — Discharge Instructions (Signed)

## 2015-08-11 NOTE — MAU Note (Signed)
PT SAYS SHE HAS PAIN IN HER   LOWER  ABD - AND PERINEUM  -  HURTS  TO WALK     OFF/ ON   X2  WEEKS .  SAYS HAS BEEN HAVING  [facility]  X3 DAYS.      SAW DR MARSHALL ON 10-5-  TOLD  HER IT WAS COMING  FROM HER CERVIX-  BUT NO VE.     LAST SEX-    SEPT.        DENIES HSV AND MRSA.

## 2015-08-11 NOTE — ED Notes (Signed)
Prescription for accu-check fast click lancets faxed to [facility] pharmacy for patient to check sugars 4 times daily.  Prescription also handed to patient to take to pharmacy for any concerns.

## 2015-08-16 ENCOUNTER — Other Ambulatory Visit (HOSPITAL_COMMUNITY): Payer: Self-pay | Admitting: Obstetrics

## 2015-08-16 LAB — OB RESULTS CONSOLE GC/CHLAMYDIA
CHLAMYDIA, DNA PROBE: NEGATIVE
Gonorrhea: NEGATIVE

## 2015-08-16 LAB — OB RESULTS CONSOLE GBS: STREP GROUP B AG: NEGATIVE

## 2015-08-18 ENCOUNTER — Ambulatory Visit (HOSPITAL_COMMUNITY)
Admission: RE | Admit: 2015-08-18 | Discharge: 2015-08-18 | Disposition: A | Discharge status: Home / Self Care | Payer: Medicaid Other | Source: Ambulatory Visit | Attending: Obstetrics | Admitting: Obstetrics

## 2015-08-18 ENCOUNTER — Other Ambulatory Visit (HOSPITAL_COMMUNITY): Payer: Self-pay | Admitting: Maternal and Fetal Medicine

## 2015-08-18 ENCOUNTER — Ambulatory Visit (HOSPITAL_COMMUNITY): Payer: Medicaid Other

## 2015-08-18 DIAGNOSIS — O09299 Supervision of pregnancy with other poor reproductive or obstetric history, unspecified trimester: Secondary | ICD-10-CM

## 2015-08-18 DIAGNOSIS — O24414 Gestational diabetes mellitus in pregnancy, insulin controlled: Secondary | ICD-10-CM

## 2015-08-18 DIAGNOSIS — Z3A35 35 weeks gestation of pregnancy: Secondary | ICD-10-CM

## 2015-08-18 DIAGNOSIS — O139 Gestational [pregnancy-induced] hypertension without significant proteinuria, unspecified trimester: Secondary | ICD-10-CM

## 2015-08-18 DIAGNOSIS — O09899 Supervision of other high risk pregnancies, unspecified trimester: Secondary | ICD-10-CM

## 2015-08-22 ENCOUNTER — Ambulatory Visit (HOSPITAL_COMMUNITY)
Admission: RE | Admit: 2015-08-22 | Discharge: 2015-08-22 | Disposition: A | Discharge status: Home / Self Care | Payer: Medicaid Other | Source: Ambulatory Visit | Attending: Maternal and Fetal Medicine | Admitting: Maternal and Fetal Medicine

## 2015-08-22 ENCOUNTER — Encounter (HOSPITAL_COMMUNITY): Payer: Self-pay

## 2015-08-22 ENCOUNTER — Other Ambulatory Visit (HOSPITAL_COMMUNITY): Payer: Self-pay | Admitting: Maternal and Fetal Medicine

## 2015-08-22 ENCOUNTER — Ambulatory Visit (HOSPITAL_COMMUNITY): Payer: Medicaid Other

## 2015-08-22 DIAGNOSIS — O24414 Gestational diabetes mellitus in pregnancy, insulin controlled: Secondary | ICD-10-CM | POA: Insufficient documentation

## 2015-08-22 DIAGNOSIS — O09293 Supervision of pregnancy with other poor reproductive or obstetric history, third trimester: Secondary | ICD-10-CM | POA: Insufficient documentation

## 2015-08-22 DIAGNOSIS — Z3A35 35 weeks gestation of pregnancy: Secondary | ICD-10-CM | POA: Diagnosis not present

## 2015-08-22 DIAGNOSIS — O09299 Supervision of pregnancy with other poor reproductive or obstetric history, unspecified trimester: Secondary | ICD-10-CM

## 2015-08-22 DIAGNOSIS — O09899 Supervision of other high risk pregnancies, unspecified trimester: Secondary | ICD-10-CM

## 2015-08-23 ENCOUNTER — Other Ambulatory Visit (HOSPITAL_COMMUNITY): Payer: Self-pay | Admitting: Obstetrics

## 2015-08-25 ENCOUNTER — Ambulatory Visit (HOSPITAL_COMMUNITY)
Admission: RE | Admit: 2015-08-25 | Discharge: 2015-08-25 | Disposition: A | Discharge status: Home / Self Care | Payer: Medicaid Other | Source: Ambulatory Visit | Attending: Maternal and Fetal Medicine | Admitting: Maternal and Fetal Medicine

## 2015-08-25 ENCOUNTER — Ambulatory Visit (HOSPITAL_COMMUNITY): Payer: Medicaid Other

## 2015-08-25 DIAGNOSIS — Z36 Encounter for antenatal screening of mother: Secondary | ICD-10-CM | POA: Insufficient documentation

## 2015-08-25 DIAGNOSIS — O24415 Gestational diabetes mellitus in pregnancy, controlled by oral hypoglycemic drugs: Secondary | ICD-10-CM | POA: Diagnosis present

## 2015-08-25 DIAGNOSIS — O09299 Supervision of pregnancy with other poor reproductive or obstetric history, unspecified trimester: Secondary | ICD-10-CM | POA: Diagnosis present

## 2015-08-25 DIAGNOSIS — Z3A36 36 weeks gestation of pregnancy: Secondary | ICD-10-CM | POA: Insufficient documentation

## 2015-08-26 ENCOUNTER — Encounter (HOSPITAL_COMMUNITY): Payer: Self-pay

## 2015-08-28 ENCOUNTER — Inpatient Hospital Stay (HOSPITAL_COMMUNITY)
Admission: AD | Admit: 2015-08-28 | Discharge: 2015-09-02 | DRG: 781 | Disposition: A | Discharge status: Home / Self Care | Payer: Medicaid Other | Source: Ambulatory Visit | Attending: Obstetrics | Admitting: Obstetrics

## 2015-08-28 ENCOUNTER — Encounter (HOSPITAL_COMMUNITY): Payer: Self-pay | Admitting: *Deleted

## 2015-08-28 DIAGNOSIS — O09293 Supervision of pregnancy with other poor reproductive or obstetric history, third trimester: Secondary | ICD-10-CM

## 2015-08-28 DIAGNOSIS — Z87891 Personal history of nicotine dependence: Secondary | ICD-10-CM

## 2015-08-28 DIAGNOSIS — O24919 Unspecified diabetes mellitus in pregnancy, unspecified trimester: Secondary | ICD-10-CM | POA: Diagnosis present

## 2015-08-28 DIAGNOSIS — Z3A36 36 weeks gestation of pregnancy: Secondary | ICD-10-CM

## 2015-08-28 DIAGNOSIS — Z794 Long term (current) use of insulin: Secondary | ICD-10-CM

## 2015-08-28 DIAGNOSIS — Z7984 Long term (current) use of oral hypoglycemic drugs: Secondary | ICD-10-CM

## 2015-08-28 DIAGNOSIS — O24113 Pre-existing diabetes mellitus, type 2, in pregnancy, third trimester: Principal | ICD-10-CM | POA: Diagnosis present

## 2015-08-28 DIAGNOSIS — E139 Other specified diabetes mellitus without complications: Secondary | ICD-10-CM

## 2015-08-28 DIAGNOSIS — O09299 Supervision of pregnancy with other poor reproductive or obstetric history, unspecified trimester: Secondary | ICD-10-CM

## 2015-08-28 DIAGNOSIS — E119 Type 2 diabetes mellitus without complications: Secondary | ICD-10-CM | POA: Diagnosis present

## 2015-08-28 LAB — COMPREHENSIVE METABOLIC PANEL
ALBUMIN: 3.1 g/dL — AB (ref 3.5–5.0)
ALT: 28 U/L (ref 14–54)
AST: 22 U/L (ref 15–41)
Alkaline Phosphatase: 154 U/L — ABNORMAL HIGH (ref 38–126)
Anion gap: 6 (ref 5–15)
BILIRUBIN TOTAL: 0.5 mg/dL (ref 0.3–1.2)
CHLORIDE: 108 mmol/L (ref 101–111)
CO2: 19 mmol/L — ABNORMAL LOW (ref 22–32)
Calcium: 9.3 mg/dL (ref 8.9–10.3)
Creatinine, Ser: 0.47 mg/dL (ref 0.44–1.00)
GFR calc Af Amer: >60 mL/min (ref >60)
GLUCOSE: 246 mg/dL — AB (ref 65–99)
POTASSIUM: 3.3 mmol/L — AB (ref 3.5–5.1)
Sodium: 133 mmol/L — ABNORMAL LOW (ref 135–145)
Total Protein: 6.3 g/dL — ABNORMAL LOW (ref 6.5–8.1)

## 2015-08-28 LAB — CBC
HEMATOCRIT: 33.5 % — AB (ref 36.0–46.0)
Hemoglobin: 11.5 g/dL — ABNORMAL LOW (ref 12.0–15.0)
MCH: 30.2 pg (ref 26.0–34.0)
MCHC: 34.3 g/dL (ref 30.0–36.0)
MCV: 87.9 fL (ref 78.0–100.0)
PLATELETS: 233 10*3/uL (ref 150–400)
RBC: 3.81 MIL/uL — AB (ref 3.87–5.11)
RDW: 14.8 % (ref 11.5–15.5)
WBC: 11.1 10*3/uL — AB (ref 4.0–10.5)

## 2015-08-28 LAB — GLUCOSE, CAPILLARY: GLUCOSE-CAPILLARY: 250 mg/dL — AB (ref 65–99)

## 2015-08-28 MED ORDER — INSULIN ASPART 100 UNIT/ML ~~LOC~~ SOLN
4.0000 [IU] | SUBCUTANEOUS | Status: DC
  Administered 2015-08-28: 12 [IU] via SUBCUTANEOUS
  Administered 2015-08-29 (×2): 4 [IU] via SUBCUTANEOUS
  Administered 2015-08-29: 8 [IU] via SUBCUTANEOUS
  Administered 2015-08-29: 4 [IU] via SUBCUTANEOUS

## 2015-08-28 MED ORDER — ZOLPIDEM TARTRATE 5 MG PO TABS: 5.0000 mg | ORAL_TABLET | Freq: Every evening | ORAL | Status: DC | PRN

## 2015-08-28 NOTE — Plan of Care (Signed)
Problem: Consults Goal: Birthing Suites Patient Information Press F2 to bring up selections list Outcome: Completed/Met Date Met:  08/28/15  Pt < [redacted] weeks EGA and Diabetic

## 2015-08-29 ENCOUNTER — Ambulatory Visit (HOSPITAL_COMMUNITY): Admission: RE | Admit: 2015-08-29 | Payer: Medicaid Other | Source: Ambulatory Visit

## 2015-08-29 ENCOUNTER — Inpatient Hospital Stay (HOSPITAL_COMMUNITY): Payer: Medicaid Other

## 2015-08-29 ENCOUNTER — Ambulatory Visit (HOSPITAL_COMMUNITY): Payer: Medicaid Other | Attending: Obstetrics and Gynecology

## 2015-08-29 DIAGNOSIS — E139 Other specified diabetes mellitus without complications: Secondary | ICD-10-CM

## 2015-08-29 DIAGNOSIS — E119 Type 2 diabetes mellitus without complications: Secondary | ICD-10-CM

## 2015-08-29 DIAGNOSIS — O09299 Supervision of pregnancy with other poor reproductive or obstetric history, unspecified trimester: Secondary | ICD-10-CM | POA: Insufficient documentation

## 2015-08-29 DIAGNOSIS — Z794 Long term (current) use of insulin: Secondary | ICD-10-CM

## 2015-08-29 LAB — GLUCOSE, CAPILLARY
GLUCOSE-CAPILLARY: 63 mg/dL — AB (ref 65–99)
GLUCOSE-CAPILLARY: 171 mg/dL — AB (ref 65–99)
GLUCOSE-CAPILLARY: 118 mg/dL — AB (ref 65–99)
Glucose-Capillary: 163 mg/dL — ABNORMAL HIGH (ref 65–99)
Glucose-Capillary: 116 mg/dL — ABNORMAL HIGH (ref 65–99)
Glucose-Capillary: 102 mg/dL — ABNORMAL HIGH (ref 65–99)
Glucose-Capillary: 128 mg/dL — ABNORMAL HIGH (ref 65–99)
Glucose-Capillary: 102 mg/dL — ABNORMAL HIGH (ref 65–99)
Glucose-Capillary: 59 mg/dL — ABNORMAL LOW (ref 65–99)

## 2015-08-29 LAB — TYPE AND SCREEN
ABO/RH(D): O POS
Antibody Screen: NEGATIVE

## 2015-08-29 MED ORDER — SODIUM CHLORIDE 0.9 % IJ SOLN
3.0000 mL | Freq: Two times a day (BID) | INTRAMUSCULAR | Status: DC
  Administered 2015-08-29 – 2015-09-01 (×7): 3 mL via INTRAVENOUS

## 2015-08-29 MED ORDER — FAMOTIDINE 20 MG PO TABS
20.0000 mg | ORAL_TABLET | Freq: Two times a day (BID) | ORAL | Status: DC
  Administered 2015-08-29 – 2015-09-02 (×8): 20 mg via ORAL
  Filled 2015-08-29 (×8): qty 1

## 2015-08-29 MED ORDER — INSULIN ASPART 100 UNIT/ML ~~LOC~~ SOLN
8.0000 [IU] | Freq: Three times a day (TID) | SUBCUTANEOUS | Status: DC
  Administered 2015-08-29 – 2015-08-31 (×5): 8 [IU] via SUBCUTANEOUS

## 2015-08-29 MED ORDER — INSULIN DETEMIR 100 UNIT/ML ~~LOC~~ SOLN
30.0000 [IU] | Freq: Every day | SUBCUTANEOUS | Status: DC
  Administered 2015-08-29 – 2015-08-30 (×2): 30 [IU] via SUBCUTANEOUS
  Filled 2015-08-29 (×2): qty 0.3

## 2015-08-29 NOTE — Progress Notes (Signed)
Pt given peanut butter crackers secondary c/o dizziness and CBG /dl.

## 2015-08-29 NOTE — Progress Notes (Signed)
Fetus active, movement both visible & audible.  EFM removed secondary BPP 8/8

## 2015-08-29 NOTE — Progress Notes (Signed)
Pt given 4oz apple juice, dinner tray arrived.  Pt encouraged to begin eating dinner and CBG to be repeated in 15 minutes

## 2015-08-29 NOTE — Progress Notes (Signed)
Dr. Gaynell Face asked by RN to review EFM tracing prior to pt being removed secondary occasional variable decels.  MD said pt to remain on EFM & will eval later.

## 2015-08-29 NOTE — Consult Note (Signed)
Maternal Fetal Medicine Consultation  Requesting Provider(s): Francoise Ceo, MD  Reason for consultation: Acutely worsening blood sugars  HPI: Sandra Foster is a 24 year-old G3P1020, EDD 09/21/2015 who is currently at 36w 5d seen for consultation.  Sandra Foster has been followed as an outpatient by the MFM service.  Her OB history is remarkable for a previous 39 week IUFD felt to be secondary to hyperglycemic/ hyperosmotic state.  Outside of pregnancy, the patient has not required medications for hyperglycemia.  The patient has been following fasting and post prandial fingerstick glucose values throughout her pregnancy.  She was started on Glyburide during the 3rd trimester and has previously been fairly well-controlled.  Over the weekend, Sandra Foster had blood sugars as high as 300 mg/dl.  She was admitted and followed with sliding scale insulin.  Her initial blood sugar was 250 mg/dl.  Since then, her blood sugars have been 163 mg/dl and 161 mg/dl (fasting this AM).  She is without complaints today.  The fetus is active.  OB History: OB History    Gravida Para Term Preterm AB TAB SAB Ectopic Multiple Living   0   0      PMH:  Past Medical History  Diagnosis Date  . Hernia   . Chlamydia   . Pregnancy induced hypertension   . Pyelonephritis   . Fetal demise     PSH:  Past Surgical History  Procedure Laterality Date  . Hernia repair     Meds:  Scheduled Meds: . insulin aspart  4-24 Units Subcutaneous Q4H   Continuous Infusions:  PRN Meds:.zolpidem   Allergies: No Known Allergies   FH:  Family History  Problem Relation Age of Onset  . Hypertension Maternal Grandmother    Soc:  Social History   Social History  . Marital Status: Single    Spouse Name: N/A  . Number of Children: N/A  . Years of Education: N/A   Occupational History  . Not on file.   Social History Main Topics  . Smoking status: Former Smoker -- 0.25 packs/day  . Smokeless  tobacco: Former Neurosurgeon    Quit date: 06/10/2011  . Alcohol Use: No  . Drug Use: No  . Sexual Activity: Yes    Birth Control/ Protection: None   Other Topics Concern  . Not on file   Social History Narrative    Review of Systems: no vaginal bleeding or cramping/contractions, no LOF, no nausea/vomiting. All other systems reviewed and are negative.  PE:   Filed Vitals:   08/29/15 1135  BP: 105/55  Pulse: 81  Temp: 98.1 F (36.7 C)  Resp: 20    Labs: CBC    Component Value Date/Time   WBC 11.1* 08/28/2015 2150   RBC 3.81* 08/28/2015 2150   HGB 11.5* 08/28/2015 2150   HCT 33.5* 08/28/2015 2150   PLT 233 08/28/2015 2150   MCV 87.9 08/28/2015 2150   MCH 30.2 08/28/2015 2150   MCHC 34.3 08/28/2015 2150   RDW 14.8 08/28/2015 2150   LYMPHSABS 2.6 07/04/2014 2112   MONOABS 0.6 07/04/2014 2112   EOSABS 0.1 07/04/2014 2112   BASOSABS 0.0 07/04/2014 2112   CMP     Component Value Date/Time   NA 133* 08/28/2015 2150   K 3.3* 08/28/2015 2150   CL 108 08/28/2015 2150   CO2 19* 08/28/2015 2150   GLUCOSE 246* 08/28/2015 2150   BUN <5* 08/28/2015 2150   CREATININE 0.47 08/28/2015 2150  CALCIUM 9.3 08/28/2015 2150   PROT 6.3* 08/28/2015 2150   ALBUMIN 3.1* 08/28/2015 2150   AST 22 08/28/2015 2150   ALT 28 08/28/2015 2150   ALKPHOS 154* 08/28/2015 2150   BILITOT 0.5 08/28/2015 2150   GFRNONAA >60 08/28/2015 2150   GFRAA >60 08/28/2015 2150    A/P: 1. Single IUP at 36w 5d  2. Hx of type 2 diabetes - now with acutely worsening blood sugars and history of nonketotic/ hyperosmotic state with last pregnancy that resulted in a term IUFD.  Blood sugars have improved on sliding scale Novolog since admission.   Recommendations: 1) Please check HbA1C 2) Continue fasting, HS and post prandial fingerstick blood sugars 3) Would discontinue glyburide and transition to split dose insulin. 4) Recommend starting insulin as follows: Levemir 30 units qHS; Novolog 8 mg prior to  breakfast, Novolog 8 units prior to lunch and Novolog 8 units prior to dinner.  This will likely require some "fine tuning" over the next several days.  Would adjust to maintain fasting blood sugars < 100 and post prandial blood sugars < 140. 5) At least daily NSTs while inpatient 6) If able to achieve better blood sugar control, would recommend delivery at 39 weeks.  If unable to achieve better blood sugar control, would consider delivery as early at 37 weeks.  While the patient can likely be discharged after several days once blood sugar control is optimized, I would error on the side of caution given her past OB history.   Thank you for the opportunity to be a part of the care of Sandra Foster. Please contact our office if we can be of further assistance.   I spent approximately 30 minutes with this patient with over 50% of time spent in face-to-face counseling.  Alpha Gula, MD Maternal Fetal Medicine

## 2015-08-29 NOTE — H&P (Signed)
This is Dr. Francoise Ceo dictating the history and physical on  Sandra Foster  she's a 24 year old gravida 2 para 1000 at 36 weeks and 5 days she's due 09/21/2015 the patient is a diabetic and has been followed by MFM she has been on glyburide 5 mg a.m. and 7. 5 PM the patient had a previous fetal demise at 38 weeks when she was admitted with a blood sugar of 800 and her diagnosis was hyperglycemic hyperosmolar nonketotic syndrome with this pregnancy her initial glucose screen was normal but her sugars are being done 4 times a day and the patient reports that on Saturday night her blood sugar was 375 when she was admitted last night her 10 PM blood sugar was 276 and this morning fasting is 114 she has been covered overnight with a sliding scale NovoLog and to be seen by MFM and diabetic   Past medical history as stated above Past surgical history negative Social history negative System review negative physical exam well-developed female not in labor HEENT negative Lungs clear to P&A Heart regular rhythm no murmurs no gallops Breasts negative Abdomen 36 week size Pelvic as deferred Extremities negative Counselor fetal tracing is reactive

## 2015-08-29 NOTE — Progress Notes (Signed)
    Nutrition Dx: Food and nutrition-related knowledge deficit r/t limited comprehension of previous education aeb pt report.  Pt resides at "[facility] at [facility]" and has limited menu options, some of which are not appropriate for diabetics. Talked though menu options and stradegies for not exceeding meal CHO allowances. Pt had not been counting CHO's prior to admission.  Nutrition education consult for Carbohydrate Modified Gestational Diabetic Diet completed.  "Meal  plan for gestational diabetics" handout given to patient.  Basic concepts reviewed.  Questions answered.  Patient verbalizes understanding.  Elisabeth Cara M.Odis Luster LDN Neonatal Nutrition Support Specialist/RD III Pager [phone]      Phone [phone]

## 2015-08-30 DIAGNOSIS — O24919 Unspecified diabetes mellitus in pregnancy, unspecified trimester: Secondary | ICD-10-CM | POA: Diagnosis present

## 2015-08-30 LAB — GLUCOSE, CAPILLARY
GLUCOSE-CAPILLARY: 65 mg/dL (ref 65–99)
GLUCOSE-CAPILLARY: 76 mg/dL (ref 65–99)
Glucose-Capillary: 71 mg/dL (ref 65–99)
Glucose-Capillary: 114 mg/dL — ABNORMAL HIGH (ref 65–99)

## 2015-08-30 LAB — HEMOGLOBIN A1C
HEMOGLOBIN A1C: 7.8 % — AB (ref 4.8–5.6)
MEAN PLASMA GLUCOSE: 177 mg/dL

## 2015-08-30 NOTE — Progress Notes (Signed)
I received a referral from pt's nurse because pt has been feeling anxious about this pregnancy after experiencing a full-term loss in 2012.    She was open to talking about her experience with her baby who died, Zymir, and we processed some of the emotions that she is feeling as she waits for delivery of her baby Nycir.    She reported good family support from FOB, and from her aunt and her grandmother.  We spoke about the recent loss of her grandfather and how she is consciously putting off grieving for him so that she can avoid stress on her baby.  I consulted with Lulu Riding, LCSW, as well, and Spiritual Care will continue to follow as we are able.  [address] [place], Pager, [phone] 3:57 PM    08/30/15 1500  Clinical Encounter Type  Visited With Patient  Visit Type Spiritual support  Referral From Nurse  Spiritual Encounters  Spiritual Needs Emotional  Stress Factors  Patient Stress Factors (Hx of full-term perinatal loss)

## 2015-08-30 NOTE — Clinical SW OB High Risk (Signed)
Clinical Social Work Antenatal   Clinical Social Worker:  Barbee [place], [place] Date/Time:  08/30/2015, 11:56 AM Gestational Age on Admission:  24 y.o. Admitting Diagnosis: Diabetes   Expected Delivery Date:  09/21/15  Family/Home Environment  Home Address: [address] [place], [place] [address]-[id number] grandmother's home.    Patient is currently living at [facility] in [place].   Household Member/Support Name: Patient states she enjoys the company and support of the other women in the program. Other Support: Patient's boyfriend/FOB: Mardene Celeste.  Patient also reports that her aunt Gerarda Gunther, grandmother and numerous friends are involved and supportive.     Psychosocial Data  Information Source:  Patient Interview   Employment: Patient states she was working at [employer] until approximately a month and a half ago when her grandfather became ill and she stopped working in order to help care for him.  She states she "definitely" plans to return to working at [employer] after maternity leave.  Patient reports that FOB works at [employer] and "works a lot."   [employer] [facility]): [employer]: N/A   Current Grade: N/A  Homebound Arranged:  N/A  Other Resources:  Medicaid  Cultural/Environment Issues Impacting Care: None stated.  Patient's facesheet notes religion as Baptist.     Strengths/Weaknesses/Factors to Consider  Concerns Related to Hospitalization: Patient states she is feeling a little lonely.  She also states she is excited to meet her son and anxious due to her experience of IUFD at term with her last pregnancy.   Previous Pregnancies/Feelings Towards Pregnancy?  Concerns related to being/becoming a mother?: Patient states no concerns regarding becoming a mother.  She states she delivered a stillborn baby 3 years ago.  She is excited about the baby.  Social Support (FOB? Who is/will be helping with baby/other kids?):  Patient states her boyfriend/FOB is involved and supportive.  He is also the father to the child she lost.  She states he lives with his mother and works "a lot."  Couples Relationship (describe): Patient reports a positive relationship with FOB at this time.  She states the loss of their first baby caused distance between them.  She states she can look back on it and sees herself as "selfish" because she expected more support from him, but didn't realize how much he was hurting too.  She states they broke up for approximately 2 years after they lost their son.  Patient reports that they began seeing each other again and finally talked about the loss.  She states this communication brought them close together again and eventually back into a relationship.      Recent Stressful Life Events (life changes in past year?): Patient states she lost her grandfather on 08/02/15.  She states she has not wanted to grieve this loss while pregnant, so she has "put it aside."  CSW talked about the importance of compartmentalizing, and commends her for this ability.  CSW reminded her that it will be healthy to revisit this grief when she feels it is the right time.  Patient reports that her grandparents raised her from 75 days old, so losing her grandfather was more like losing her father.  She reports that her biological father is not involved and that her mother is "doing her own thing," and not close with patient.     Prenatal Care/Education/Home Preparations: Patient states the [facility] the [facility] staff will be giving her a baby shower, that was scheduled  for 09/04/15.  She informed CSW that she called the staff and requested that the shower take place at the hospital since she will most likely be here until delivery.     Domestic Violence (of any type):  No If Yes to Domestic Violence, Describe/Action Plan:    Substance Use During Pregnancy: No (If Yes, Complete SBIRT)  PHQ-9 (Depresssion Screening): Patient does  not present as depressed and CSW does not feel that completion of the PHQ-9 is appropriate at this time.   Follow-up Recommendations: CSW asked patient to contact CSW as desired for support during hospitalization.  CSW will make referral to Spiritual Care for additional support.  CSW suggests journaling and will provide a journal.  CSW encourages utilizes relaxation techniques.     Patient Advised/Response: Patient appeared very open to talking with CSW.  She states she feels trusting of her medical providers to know the proper time to deliver her baby.  She is interested in journaling again and states she will attempt relaxation techniques discussed with CSW when she is feeling anxious.       Clinical Assessment/Plan: CSW provided supportive brief counseling with a strength based approach.  Patient presents as calm and pleasant.  She appears to be in good spirits despite anxiety over her past loss and current high blood sugars.  She was talkative and receptive to CSW intervention.  Patient reports moving to [facility] at approximately 16 weeks because she wanted to better herself.  She states she was living at her grandmother's home and felt she was just sitting around and not making progress in her life.  She states since enrolling in the program, she has gotten a job and feels like she has made plans for her life.  She reports taking weekly life skills classes and states she was evaluated for the need for counseling by [facility] [place].  She states she was not found to need counseling and reports that she "copes within myself," and feels like she comes from a family of strong people who don't let situations break them down.  She states prayer helps her cope.  She also states asking questions and getting as much information about a situation, hospitalization for example, helps her.  She also states she has used her experience of loss as a motivation to live healthier.   Patient reports plans to move in with FOB and his mother after completion of the maternity housing program, which will be at delivery.  She states being in the program has allowed her to earn money (for chores and tasks), which she and FOB plan to save along with earnings from employment to get their own place together eventually.  Patient states PGM is supportive of their living in her home with the baby.  Patient appears to be coping well at this time.  She reports having a great natural support system and is open to ongoing support from hospital staff.

## 2015-08-30 NOTE — Progress Notes (Signed)
MFM note  Capillary blood glucose values reviewed over last 24 hrs - had one elevated value (171 mg/dl) after dinner last night.  Would not make any changes to insulin regimen for now.  Overall, much better control.  If after dinner values elevated today, would increase dinner Novolog to 10.  Continue in-house observation for now.  Alpha Gula, MD

## 2015-08-30 NOTE — Progress Notes (Signed)
Patient ID: Sandra Foster, female   DOB: 1991/07/20, 24 y.o.   MRN: [id number] Vital signs normal patient in for control of her diabetes at 36+ weeks she is being followed by MFM and if she was not adequately controlled consider delivery

## 2015-08-31 DIAGNOSIS — E119 Type 2 diabetes mellitus without complications: Secondary | ICD-10-CM | POA: Diagnosis present

## 2015-08-31 DIAGNOSIS — O24313 Unspecified pre-existing diabetes mellitus in pregnancy, third trimester: Secondary | ICD-10-CM | POA: Diagnosis present

## 2015-08-31 DIAGNOSIS — Z3A36 36 weeks gestation of pregnancy: Secondary | ICD-10-CM | POA: Diagnosis not present

## 2015-08-31 DIAGNOSIS — O24113 Pre-existing diabetes mellitus, type 2, in pregnancy, third trimester: Secondary | ICD-10-CM | POA: Diagnosis present

## 2015-08-31 DIAGNOSIS — Z7984 Long term (current) use of oral hypoglycemic drugs: Secondary | ICD-10-CM | POA: Diagnosis not present

## 2015-08-31 DIAGNOSIS — O09293 Supervision of pregnancy with other poor reproductive or obstetric history, third trimester: Secondary | ICD-10-CM | POA: Diagnosis not present

## 2015-08-31 DIAGNOSIS — Z87891 Personal history of nicotine dependence: Secondary | ICD-10-CM | POA: Diagnosis not present

## 2015-08-31 LAB — GLUCOSE, CAPILLARY
GLUCOSE-CAPILLARY: 112 mg/dL — AB (ref 65–99)
Glucose-Capillary: 77 mg/dL (ref 65–99)
Glucose-Capillary: 165 mg/dL — ABNORMAL HIGH (ref 65–99)
Glucose-Capillary: 108 mg/dL — ABNORMAL HIGH (ref 65–99)
Glucose-Capillary: 125 mg/dL — ABNORMAL HIGH (ref 65–99)

## 2015-08-31 MED ORDER — INSULIN ASPART 100 UNIT/ML ~~LOC~~ SOLN
4.0000 [IU] | Freq: Three times a day (TID) | SUBCUTANEOUS | Status: DC
  Administered 2015-08-31 – 2015-09-02 (×7): 4 [IU] via SUBCUTANEOUS

## 2015-08-31 MED ORDER — INSULIN DETEMIR 100 UNIT/ML ~~LOC~~ SOLN
26.0000 [IU] | Freq: Every day | SUBCUTANEOUS | Status: DC
  Administered 2015-08-31 – 2015-09-02 (×2): 26 [IU] via SUBCUTANEOUS
  Filled 2015-08-31 (×3): qty 0.26

## 2015-08-31 NOTE — Progress Notes (Signed)
Patient ID: Sandra Foster, female   DOB: 1991/06/14, 24 y.o.   MRN: [id number] Blood pressure 105/60 respiration 18 pulse 77 afebrile Patient is getting 8 units of no local NovoLog with each meal and 30 of Lantus at bedtime her sugars range from 68-114 we will discuss with him MFM today about adjusting the dose of insulin that she is receiving patient has some symptoms of hypoglycemia from time to time

## 2015-09-01 ENCOUNTER — Ambulatory Visit (HOSPITAL_COMMUNITY): Payer: Medicaid Other

## 2015-09-01 ENCOUNTER — Ambulatory Visit (HOSPITAL_COMMUNITY)
Admission: RE | Admit: 2015-09-01 | Discharge: 2015-09-01 | Disposition: A | Discharge status: Home / Self Care | Payer: Medicaid Other | Source: Ambulatory Visit | Attending: Maternal and Fetal Medicine | Admitting: Maternal and Fetal Medicine

## 2015-09-01 DIAGNOSIS — O09299 Supervision of pregnancy with other poor reproductive or obstetric history, unspecified trimester: Secondary | ICD-10-CM

## 2015-09-01 LAB — TYPE AND SCREEN
ABO/RH(D): O POS
Antibody Screen: NEGATIVE

## 2015-09-01 LAB — GLUCOSE, CAPILLARY
GLUCOSE-CAPILLARY: 86 mg/dL (ref 65–99)
GLUCOSE-CAPILLARY: 124 mg/dL — AB (ref 65–99)
Glucose-Capillary: 97 mg/dL (ref 65–99)

## 2015-09-01 MED ORDER — PRENATAL MULTIVITAMIN CH
1.0000 | ORAL_TABLET | Freq: Every day | ORAL | Status: DC
  Administered 2015-09-01 – 2015-09-02 (×2): 1 via ORAL
  Filled 2015-09-01 (×2): qty 1

## 2015-09-01 NOTE — Progress Notes (Signed)
Patient ID: Sandra Foster, female   DOB: [birth year], 24 y.o.   MRN: [id number] Blood pressure 116/67 respiration 18 pulse 72 she is afebrile Her liver Corrie Dandy was changed from 30 at bedtime to 26 and the NovoLog change from 84 with meals yesterday morning fasting was 108 and 125 and last night her at bedtime sugar was 165 Patient is no longer symptomatic and we'll continue observing her sugars tracing is reactive

## 2015-09-01 NOTE — Progress Notes (Signed)
MFM note  Reviewed fingerstick values over last 24 hrs - all appear to be within target range.  Would continue to observe through the day - if they remain well-controlled, would consider discharge home either this evening or tomorrow morning. The patient will need antenatal testing and follow up in the MFM clinic next week.  If testing and blood sugars remain well-controlled, would recommend delivery at 39 weeks.  Alpha Gula, MD Maternal Fetal Medicine

## 2015-09-01 NOTE — Progress Notes (Signed)
Inpatient Diabetes Program Recommendations  AACE/Alyson: New Consensus Statement on Inpatient Glycemic Control (2015)  Target Ranges:  Prepandial:   less than 140 mg/dL      Peak postprandial:   less than 180 mg/dL (1-2 hours)      Critically ill patients:  140 - 180 mg/dL   Review of Glycemic Control  Inpatient Diabetes Program Recommendations:  Noted patient with some hypoglycemia and some cbg's high post-prandial. If using calculations per "Diabetic Pregnant Order set", (based on gestational age and weight) the basal needs calculate at 14 units in the am and 14 units at HS of NPH (Noted order for 28 units levemir once a day, however levemir does not have the slight peak during the early to mid-afternoon when the 2 hr. post-prandial lunch cbg is checked    It also does not have the slight peak to cover the early am (4-5 am) hormonal surge which can elevate the fasting.) Levemir does help control the fasting glucose, but Zaray guidelines state NPH bid is the ideal insulin used during pregnancy. The calculations also start with meal coverage of 1 unit per 6 grams CHO: Breakfast at 30 grams-5 units, lunch and supper at 60 grams-10 units. The correction scale is also calculated according to the TDD of daily insulin needs. I am glad to assist with entering orders and using the above regimen with correction for fasting and 2 hrs PP in order to avoid hypoglycemia and correct hyperglycemia during these last few weeks prior to delivery.  Thank you Lenor Coffin, RN, MSN, CDE  Diabetes Inpatient Program Office: [phone] Pager: [phone] 8:00 am to 5:00 pm

## 2015-09-02 ENCOUNTER — Other Ambulatory Visit: Payer: Self-pay | Admitting: Obstetrics

## 2015-09-02 DIAGNOSIS — O24419 Gestational diabetes mellitus in pregnancy, unspecified control: Secondary | ICD-10-CM

## 2015-09-02 LAB — GLUCOSE, CAPILLARY
GLUCOSE-CAPILLARY: 91 mg/dL (ref 65–99)
Glucose-Capillary: 76 mg/dL (ref 65–99)
Glucose-Capillary: 104 mg/dL — ABNORMAL HIGH (ref 65–99)
Glucose-Capillary: 59 mg/dL — ABNORMAL LOW (ref 65–99)
Glucose-Capillary: 69 mg/dL (ref 65–99)

## 2015-09-02 NOTE — Discharge Instructions (Signed)
Discharge instructions   You can wash your hair  Shower  Eat what you want  Drink what you want  See me in 6 weeks  Your ankles are going to swell more in the next 2 weeks than when pregnant  No sex for 6 weeks   MARSHALL,BERNARD A, MD 09/02/2015  Fetal Movement Counts Patient Name: __________________________________________________ Patient Due Date: ____________________ Performing a fetal movement count is highly recommended in high-risk pregnancies, but it is good for every pregnant woman to do. Your health care provider may ask you to start counting fetal movements at 28 weeks of the pregnancy. Fetal movements often increase:  After eating a full meal.  After physical activity.  After eating or drinking something sweet or cold.  At rest. Pay attention to when you feel the baby is most active. This will help you notice a pattern of your baby's sleep and wake cycles and what factors contribute to an increase in fetal movement. It is important to perform a fetal movement count at the same time each day when your baby is normally most active.  HOW TO COUNT FETAL MOVEMENTS 7. Find a quiet and comfortable area to sit or lie down on your left side. Lying on your left side provides the best blood and oxygen circulation to your baby. 8. Write down the day and time on a sheet of paper or in a journal. 9. Start counting kicks, flutters, swishes, rolls, or jabs in a 2-hour period. You should feel at least 10 movements within 2 hours. 10. If you do not feel 10 movements in 2 hours, wait 2-3 hours and count again. Look for a change in the pattern or not enough counts in 2 hours. SEEK MEDICAL CARE IF:  You feel less than 10 counts in 2 hours, tried twice.  There is no movement in over an hour.  The pattern is changing or taking longer each day to reach 10 counts in 2 hours.  You feel the baby is not moving as he or she usually does. Date: ____________ Movements: ____________ Start  time: ____________ Doreatha Martin time: ____________  Date: ____________ Movements: ____________ Start time: ____________ Doreatha Martin time: ____________ Date: ____________ Movements: ____________ Start time: ____________ Doreatha Martin time: ____________ Date: ____________ Movements: ____________ Start time: ____________ Doreatha Martin time: ____________ Date: ____________ Movements: ____________ Start time: ____________ Doreatha Martin time: ____________ Date: ____________ Movements: ____________ Start time: ____________ Doreatha Martin time: ____________ Date: ____________ Movements: ____________ Start time: ____________ Doreatha Martin time: ____________ Date: ____________ Movements: ____________ Start time: ____________ Doreatha Martin time: ____________  Date: ____________ Movements: ____________ Start time: ____________ Doreatha Martin time: ____________ Date: ____________ Movements: ____________ Start time: ____________ Doreatha Martin time: ____________ Date: ____________ Movements: ____________ Start time: ____________ Doreatha Martin time: ____________ Date: ____________ Movements: ____________ Start time: ____________ Doreatha Martin time: ____________ Date: ____________ Movements: ____________ Start time: ____________ Doreatha Martin time: ____________ Date: ____________ Movements: ____________ Start time: ____________ Doreatha Martin time: ____________ Date: ____________ Movements: ____________ Start time: ____________ Doreatha Martin time: ____________  Date: ____________ Movements: ____________ Start time: ____________ Doreatha Martin time: ____________ Date: ____________ Movements: ____________ Start time: ____________ Doreatha Martin time: ____________ Date: ____________ Movements: ____________ Start time: ____________ Doreatha Martin time: ____________ Date: ____________ Movements: ____________ Start time: ____________ Doreatha Martin time: ____________ Date: ____________ Movements: ____________ Start time: ____________ Doreatha Martin time: ____________ Date: ____________ Movements: ____________ Start time: ____________ Doreatha Martin time:  ____________ Date: ____________ Movements: ____________ Start time: ____________ Doreatha Martin time: ____________  Date: ____________ Movements: ____________ Start time: ____________ Doreatha Martin time: ____________ Date: ____________ Movements: ____________ Start time: ____________ Doreatha Martin time: ____________ Date: ____________ Movements:  ____________ Start time: ____________ Doreatha Martin time: ____________ Date: ____________ Movements: ____________ Start time: ____________ Doreatha Martin time: ____________ Date: ____________ Movements: ____________ Start time: ____________ Doreatha Martin time: ____________ Date: ____________ Movements: ____________ Start time: ____________ Doreatha Martin time: ____________ Date: ____________ Movements: ____________ Start time: ____________ Doreatha Martin time: ____________  Date: ____________ Movements: ____________ Start time: ____________ Doreatha Martin time: ____________ Date: ____________ Movements: ____________ Start time: ____________ Doreatha Martin time: ____________ Date: ____________ Movements: ____________ Start time: ____________ Doreatha Martin time: ____________ Date: ____________ Movements: ____________ Start time: ____________ Doreatha Martin time: ____________ Date: ____________ Movements: ____________ Start time: ____________ Doreatha Martin time: ____________ Date: ____________ Movements: ____________ Start time: ____________ Doreatha Martin time: ____________ Date: ____________ Movements: ____________ Start time: ____________ Doreatha Martin time: ____________  Date: ____________ Movements: ____________ Start time: ____________ Doreatha Martin time: ____________ Date: ____________ Movements: ____________ Start time: ____________ Doreatha Martin time: ____________ Date: ____________ Movements: ____________ Start time: ____________ Doreatha Martin time: ____________ Date: ____________ Movements: ____________ Start time: ____________ Doreatha Martin time: ____________ Date: ____________ Movements: ____________ Start time: ____________ Doreatha Martin time: ____________ Date: ____________ Movements:  ____________ Start time: ____________ Doreatha Martin time: ____________ Date: ____________ Movements: ____________ Start time: ____________ Doreatha Martin time: ____________  Date: ____________ Movements: ____________ Start time: ____________ Doreatha Martin time: ____________ Date: ____________ Movements: ____________ Start time: ____________ Doreatha Martin time: ____________ Date: ____________ Movements: ____________ Start time: ____________ Doreatha Martin time: ____________ Date: ____________ Movements: ____________ Start time: ____________ Doreatha Martin time: ____________ Date: ____________ Movements: ____________ Start time: ____________ Doreatha Martin time: ____________ Date: ____________ Movements: ____________ Start time: ____________ Doreatha Martin time: ____________ Date: ____________ Movements: ____________ Start time: ____________ Doreatha Martin time: ____________  Date: ____________ Movements: ____________ Start time: ____________ Doreatha Martin time: ____________ Date: ____________ Movements: ____________ Start time: ____________ Doreatha Martin time: ____________ Date: ____________ Movements: ____________ Start time: ____________ Doreatha Martin time: ____________ Date: ____________ Movements: ____________ Start time: ____________ Doreatha Martin time: ____________ Date: ____________ Movements: ____________ Start time: ____________ Doreatha Martin time: ____________ Date: ____________ Movements: ____________ Start time: ____________ Doreatha Martin time: ____________   This information is not intended to replace advice given to you by your health care provider. Make sure you discuss any questions you have with your health care provider.   Document Released: 11/14/2006 Document Revised: 11/05/2014 Document Reviewed: 08/11/2012 Elsevier Interactive Patient Education [employer]

## 2015-09-02 NOTE — Discharge Summary (Signed)
  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

## 2015-09-02 NOTE — Plan of Care (Signed)
Problem: Phase I Progression Outcomes Goal: LOS < 4 days Outcome: Not Met (add Reason) Hospitalized greater than 4 days

## 2015-09-02 NOTE — Progress Notes (Signed)
Patient ID: Sandra Foster, female   DOB: February 06, 1991, 24 y.o.   MRN: [id number] Blood pressure 114/59 respiration 18 pulse 71 afebrile Patient has been under good control on Levemir 26 units at bedtime and NovoLog 4 units with meals subcutaneous she will be discharged today to see me on Monday and followed by twice weekly nonstress tests with MFM her latest blood sugar was 71

## 2015-09-05 ENCOUNTER — Ambulatory Visit (HOSPITAL_COMMUNITY): Payer: Medicaid Other

## 2015-09-05 ENCOUNTER — Other Ambulatory Visit: Payer: Self-pay | Admitting: Obstetrics

## 2015-09-05 ENCOUNTER — Encounter (HOSPITAL_COMMUNITY): Payer: Self-pay

## 2015-09-05 ENCOUNTER — Ambulatory Visit (HOSPITAL_COMMUNITY)
Admission: RE | Admit: 2015-09-05 | Discharge: 2015-09-05 | Disposition: A | Discharge status: Home / Self Care | Payer: Medicaid Other | Source: Ambulatory Visit | Attending: Obstetrics | Admitting: Obstetrics

## 2015-09-05 DIAGNOSIS — Z3A37 37 weeks gestation of pregnancy: Secondary | ICD-10-CM | POA: Insufficient documentation

## 2015-09-05 DIAGNOSIS — O24414 Gestational diabetes mellitus in pregnancy, insulin controlled: Secondary | ICD-10-CM | POA: Diagnosis not present

## 2015-09-05 DIAGNOSIS — O09299 Supervision of pregnancy with other poor reproductive or obstetric history, unspecified trimester: Secondary | ICD-10-CM

## 2015-09-05 HISTORY — DX: Gestational diabetes mellitus in pregnancy, unspecified control: O24.419

## 2015-09-08 ENCOUNTER — Encounter (HOSPITAL_COMMUNITY): Payer: Self-pay

## 2015-09-08 ENCOUNTER — Ambulatory Visit (HOSPITAL_COMMUNITY)
Admission: RE | Admit: 2015-09-08 | Discharge: 2015-09-08 | Disposition: A | Discharge status: Home / Self Care | Payer: Medicaid Other | Source: Ambulatory Visit | Attending: Maternal and Fetal Medicine | Admitting: Maternal and Fetal Medicine

## 2015-09-08 ENCOUNTER — Ambulatory Visit (HOSPITAL_COMMUNITY): Payer: Medicaid Other

## 2015-09-08 DIAGNOSIS — O24414 Gestational diabetes mellitus in pregnancy, insulin controlled: Secondary | ICD-10-CM | POA: Insufficient documentation

## 2015-09-08 DIAGNOSIS — O09299 Supervision of pregnancy with other poor reproductive or obstetric history, unspecified trimester: Secondary | ICD-10-CM | POA: Insufficient documentation

## 2015-09-08 DIAGNOSIS — Z3A38 38 weeks gestation of pregnancy: Secondary | ICD-10-CM | POA: Diagnosis not present

## 2015-09-09 ENCOUNTER — Telehealth (HOSPITAL_COMMUNITY): Payer: Self-pay | Admitting: *Deleted

## 2015-09-09 ENCOUNTER — Encounter (HOSPITAL_COMMUNITY): Payer: Self-pay | Admitting: *Deleted

## 2015-09-09 NOTE — Telephone Encounter (Signed)
Preadmission screen  

## 2015-09-12 ENCOUNTER — Ambulatory Visit (HOSPITAL_COMMUNITY): Payer: Medicaid Other

## 2015-09-12 ENCOUNTER — Inpatient Hospital Stay (HOSPITAL_COMMUNITY)
Admission: AD | Admit: 2015-09-12 | Discharge: 2015-09-15 | DRG: 775 | Disposition: A | Discharge status: Home / Self Care | Payer: Medicaid Other | Source: Ambulatory Visit | Attending: Obstetrics | Admitting: Obstetrics

## 2015-09-12 ENCOUNTER — Ambulatory Visit (HOSPITAL_COMMUNITY)
Admission: RE | Admit: 2015-09-12 | Discharge: 2015-09-12 | Disposition: A | Discharge status: Home / Self Care | Payer: Medicaid Other | Source: Ambulatory Visit | Attending: Obstetrics and Gynecology | Admitting: Obstetrics and Gynecology

## 2015-09-12 ENCOUNTER — Encounter (HOSPITAL_COMMUNITY): Payer: Self-pay

## 2015-09-12 ENCOUNTER — Encounter (HOSPITAL_COMMUNITY): Payer: Self-pay | Admitting: *Deleted

## 2015-09-12 DIAGNOSIS — E1100 Type 2 diabetes mellitus with hyperosmolarity without nonketotic hyperglycemic-hyperosmolar coma (NKHHC): Secondary | ICD-10-CM | POA: Diagnosis present

## 2015-09-12 DIAGNOSIS — O09299 Supervision of pregnancy with other poor reproductive or obstetric history, unspecified trimester: Secondary | ICD-10-CM

## 2015-09-12 DIAGNOSIS — O2412 Pre-existing diabetes mellitus, type 2, in childbirth: Principal | ICD-10-CM | POA: Diagnosis present

## 2015-09-12 DIAGNOSIS — O24414 Gestational diabetes mellitus in pregnancy, insulin controlled: Secondary | ICD-10-CM | POA: Insufficient documentation

## 2015-09-12 DIAGNOSIS — Z3A38 38 weeks gestation of pregnancy: Secondary | ICD-10-CM

## 2015-09-12 DIAGNOSIS — Z87891 Personal history of nicotine dependence: Secondary | ICD-10-CM

## 2015-09-12 DIAGNOSIS — Z3490 Encounter for supervision of normal pregnancy, unspecified, unspecified trimester: Secondary | ICD-10-CM

## 2015-09-12 LAB — CBC
HCT: 36.3 % (ref 36.0–46.0)
Hemoglobin: 12.6 g/dL (ref 12.0–15.0)
MCH: 30.3 pg (ref 26.0–34.0)
MCHC: 34.7 g/dL (ref 30.0–36.0)
MCV: 87.3 fL (ref 78.0–100.0)
PLATELETS: 180 10*3/uL (ref 150–400)
RBC: 4.16 MIL/uL (ref 3.87–5.11)
RDW: 14.7 % (ref 11.5–15.5)
WBC: 10.7 10*3/uL — ABNORMAL HIGH (ref 4.0–10.5)

## 2015-09-12 LAB — TYPE AND SCREEN
ABO/RH(D): O POS
ANTIBODY SCREEN: NEGATIVE

## 2015-09-12 LAB — GLUCOSE, CAPILLARY
Glucose-Capillary: 142 mg/dL — ABNORMAL HIGH (ref 65–99)
Glucose-Capillary: 163 mg/dL — ABNORMAL HIGH (ref 65–99)

## 2015-09-12 MED ORDER — ACETAMINOPHEN 325 MG PO TABS: 650.0000 mg | ORAL_TABLET | ORAL | Status: DC | PRN

## 2015-09-12 MED ORDER — FLEET ENEMA 7-19 GM/118ML RE ENEM: 1.0000 | ENEMA | RECTAL | Status: DC | PRN

## 2015-09-12 MED ORDER — INSULIN ASPART 100 UNIT/ML ~~LOC~~ SOLN
4.0000 [IU] | SUBCUTANEOUS | Status: DC
  Administered 2015-09-12: 8 [IU] via SUBCUTANEOUS
  Administered 2015-09-12 – 2015-09-13 (×3): 4 [IU] via SUBCUTANEOUS

## 2015-09-12 MED ORDER — LIDOCAINE HCL (PF) 1 % IJ SOLN
30.0000 mL | INTRAMUSCULAR | Status: DC | PRN
  Filled 2015-09-12: qty 30

## 2015-09-12 MED ORDER — TERBUTALINE SULFATE 1 MG/ML IJ SOLN: 0.2500 mg | Freq: Once | INTRAMUSCULAR | Status: DC | PRN

## 2015-09-12 MED ORDER — LACTATED RINGERS IV SOLN
INTRAVENOUS | Status: DC
  Administered 2015-09-12 – 2015-09-13 (×4): via INTRAVENOUS

## 2015-09-12 MED ORDER — MISOPROSTOL 25 MCG QUARTER TABLET
25.0000 ug | ORAL_TABLET | ORAL | Status: AC
  Administered 2015-09-12 (×2): 25 ug via VAGINAL
  Filled 2015-09-12 (×2): qty 0.25

## 2015-09-12 MED ORDER — FENTANYL CITRATE (PF) 100 MCG/2ML IJ SOLN: 50.0000 ug | INTRAMUSCULAR | Status: DC | PRN

## 2015-09-12 MED ORDER — DIPHENHYDRAMINE HCL 50 MG/ML IJ SOLN: 12.5000 mg | INTRAMUSCULAR | Status: DC | PRN

## 2015-09-12 MED ORDER — PHENYLEPHRINE 40 MCG/ML (10ML) SYRINGE FOR IV PUSH (FOR BLOOD PRESSURE SUPPORT)
80.0000 ug | PREFILLED_SYRINGE | INTRAVENOUS | Status: DC | PRN
  Filled 2015-09-12: qty 20
  Filled 2015-09-12: qty 2

## 2015-09-12 MED ORDER — CITRIC ACID-SODIUM CITRATE 334-500 MG/5ML PO SOLN
30.0000 mL | ORAL | Status: DC | PRN
  Administered 2015-09-12: 30 mL via ORAL
  Filled 2015-09-12: qty 15

## 2015-09-12 MED ORDER — ONDANSETRON HCL 4 MG/2ML IJ SOLN
4.0000 mg | Freq: Four times a day (QID) | INTRAMUSCULAR | Status: DC | PRN
  Filled 2015-09-12: qty 2

## 2015-09-12 MED ORDER — FENTANYL 2.5 MCG/ML BUPIVACAINE 1/10 % EPIDURAL INFUSION (WH - ANES)
14.0000 mL/h | INTRAMUSCULAR | Status: DC | PRN
  Administered 2015-09-13 (×3): 14 mL/h via EPIDURAL
  Filled 2015-09-12 (×2): qty 125

## 2015-09-12 MED ORDER — EPHEDRINE 5 MG/ML INJ
10.0000 mg | INTRAVENOUS | Status: DC | PRN
  Filled 2015-09-12: qty 2

## 2015-09-12 MED ORDER — OXYTOCIN 40 UNITS IN LACTATED RINGERS INFUSION - SIMPLE MED
1.0000 m[IU]/min | INTRAVENOUS | Status: DC
  Administered 2015-09-12: 2 m[IU]/min via INTRAVENOUS
  Filled 2015-09-12: qty 1000

## 2015-09-12 MED ORDER — LACTATED RINGERS IV SOLN
500.0000 mL | INTRAVENOUS | Status: DC | PRN
  Administered 2015-09-13: 500 mL via INTRAVENOUS

## 2015-09-12 NOTE — H&P (Signed)
This is Dr. Francoise Ceo dictating the history and physical on  Sandra Foster  she is a 24 year old gravida 3 para 1010 her EDC is 09/21/2015 she is 38 weeks and 5 days and the patient has hyperglycemic hyperosmolar nonketotic syndrome and is brought in for induction.  on 1031 she was admitted because she was on glyburide  And control  was not good and then she was placed on insulin Levemir 26 units at bedtime and NovoLog 4 units with each meal however since then her control  has not been the greatest and MFM said induce her  today her GBS is negative her cervix is closed vertex -2 and she had Cytotec placed 2 and then Pitocin Past medical history at age 23 she was pregnant and had normal blood sugars and was admitted at 38 weeks  With a dwead   baby and a blood sugar  Of  800 she was diagnosed with a hyperglycemic hyper  Osmolar  Nonketotic  syndrome her sugars were normal after delivery and with this pregnancy she had a 3hr glucose test  done early in the pregnancy which was normal however as the pregnancy advanced she became diabetic and difficult to control Past surgical history negative Social history negative System review negative Patient has been followed by MFM and has NSTs twice a week and ultrasounds weekly in the past month Physical exam well-developed female not in labor HEENT negative Lungs clear to P&A Heart regular rhythm no murmurs no gallops Breasts negative Abdomen term Pelvic as described above Extremities negative

## 2015-09-13 ENCOUNTER — Inpatient Hospital Stay (HOSPITAL_COMMUNITY): Payer: Medicaid Other | Admitting: Anesthesiology

## 2015-09-13 ENCOUNTER — Encounter (HOSPITAL_COMMUNITY): Payer: Self-pay

## 2015-09-13 DIAGNOSIS — Z3490 Encounter for supervision of normal pregnancy, unspecified, unspecified trimester: Secondary | ICD-10-CM

## 2015-09-13 LAB — GLUCOSE, CAPILLARY
GLUCOSE-CAPILLARY: 147 mg/dL — AB (ref 65–99)
GLUCOSE-CAPILLARY: 128 mg/dL — AB (ref 65–99)
GLUCOSE-CAPILLARY: 81 mg/dL (ref 65–99)
Glucose-Capillary: 102 mg/dL — ABNORMAL HIGH (ref 65–99)
Glucose-Capillary: 78 mg/dL (ref 65–99)
Glucose-Capillary: 140 mg/dL — ABNORMAL HIGH (ref 65–99)

## 2015-09-13 LAB — RPR: RPR: NONREACTIVE

## 2015-09-13 MED ORDER — CITRIC ACID-SODIUM CITRATE 334-500 MG/5ML PO SOLN: 30.0000 mL | ORAL | Status: DC | PRN

## 2015-09-13 MED ORDER — ONDANSETRON HCL 4 MG/2ML IJ SOLN
4.0000 mg | Freq: Four times a day (QID) | INTRAMUSCULAR | Status: DC | PRN
  Administered 2015-09-13: 4 mg via INTRAVENOUS

## 2015-09-13 MED ORDER — OXYCODONE-ACETAMINOPHEN 5-325 MG PO TABS: 1.0000 | ORAL_TABLET | ORAL | Status: DC | PRN

## 2015-09-13 MED ORDER — ONDANSETRON HCL 4 MG/2ML IJ SOLN: 4.0000 mg | INTRAMUSCULAR | Status: DC | PRN

## 2015-09-13 MED ORDER — WITCH HAZEL-GLYCERIN EX PADS
1.0000 "application " | MEDICATED_PAD | CUTANEOUS | Status: DC | PRN
  Administered 2015-09-14: 1 via TOPICAL

## 2015-09-13 MED ORDER — LIDOCAINE HCL (PF) 1 % IJ SOLN
INTRAMUSCULAR | Status: DC | PRN
  Administered 2015-09-13 (×2): 4 mL

## 2015-09-13 MED ORDER — ONDANSETRON HCL 4 MG PO TABS: 4.0000 mg | ORAL_TABLET | ORAL | Status: DC | PRN

## 2015-09-13 MED ORDER — ZOLPIDEM TARTRATE 5 MG PO TABS: 5.0000 mg | ORAL_TABLET | Freq: Every evening | ORAL | Status: DC | PRN

## 2015-09-13 MED ORDER — DIPHENHYDRAMINE HCL 25 MG PO CAPS: 25.0000 mg | ORAL_CAPSULE | Freq: Four times a day (QID) | ORAL | Status: DC | PRN

## 2015-09-13 MED ORDER — DIBUCAINE 1 % RE OINT
1.0000 "application " | TOPICAL_OINTMENT | RECTAL | Status: DC | PRN
  Administered 2015-09-14: 1 via RECTAL
  Filled 2015-09-13: qty 28

## 2015-09-13 MED ORDER — OXYTOCIN 40 UNITS IN LACTATED RINGERS INFUSION - SIMPLE MED
62.5000 mL/h | INTRAVENOUS | Status: DC
  Administered 2015-09-13: 999 mL/h via INTRAVENOUS
  Filled 2015-09-13: qty 1000

## 2015-09-13 MED ORDER — FERROUS SULFATE 325 (65 FE) MG PO TABS
325.0000 mg | ORAL_TABLET | Freq: Two times a day (BID) | ORAL | Status: DC
  Administered 2015-09-14 – 2015-09-15 (×3): 325 mg via ORAL
  Filled 2015-09-13 (×3): qty 1

## 2015-09-13 MED ORDER — IBUPROFEN 600 MG PO TABS
600.0000 mg | ORAL_TABLET | Freq: Four times a day (QID) | ORAL | Status: DC
  Administered 2015-09-13 – 2015-09-15 (×7): 600 mg via ORAL
  Filled 2015-09-13 (×7): qty 1

## 2015-09-13 MED ORDER — LACTATED RINGERS IV BOLUS (SEPSIS)
500.0000 mL | Freq: Once | INTRAVENOUS | Status: AC
Start: 2015-09-13 — End: 2015-09-13
  Administered 2015-09-13: 500 mL via INTRAVENOUS

## 2015-09-13 MED ORDER — LIDOCAINE HCL (PF) 1 % IJ SOLN: 30.0000 mL | INTRAMUSCULAR | Status: DC | PRN

## 2015-09-13 MED ORDER — BENZOCAINE-MENTHOL 20-0.5 % EX AERO
1.0000 "application " | INHALATION_SPRAY | CUTANEOUS | Status: DC | PRN
  Administered 2015-09-15: 1 via TOPICAL
  Filled 2015-09-13 (×2): qty 56

## 2015-09-13 MED ORDER — LANOLIN HYDROUS EX OINT: TOPICAL_OINTMENT | CUTANEOUS | Status: DC | PRN

## 2015-09-13 MED ORDER — ACETAMINOPHEN 325 MG PO TABS
650.0000 mg | ORAL_TABLET | ORAL | Status: DC | PRN
  Administered 2015-09-14: 650 mg via ORAL
  Filled 2015-09-13: qty 2

## 2015-09-13 MED ORDER — OXYCODONE-ACETAMINOPHEN 5-325 MG PO TABS: 2.0000 | ORAL_TABLET | ORAL | Status: DC | PRN

## 2015-09-13 MED ORDER — PRENATAL MULTIVITAMIN CH
1.0000 | ORAL_TABLET | Freq: Every day | ORAL | Status: DC
  Administered 2015-09-14 – 2015-09-15 (×2): 1 via ORAL
  Filled 2015-09-13 (×2): qty 1

## 2015-09-13 MED ORDER — TETANUS-DIPHTH-ACELL PERTUSSIS 5-2.5-18.5 LF-MCG/0.5 IM SUSP
0.5000 mL | Freq: Once | INTRAMUSCULAR | Status: DC
  Filled 2015-09-13: qty 0.5

## 2015-09-13 MED ORDER — OXYCODONE-ACETAMINOPHEN 5-325 MG PO TABS
2.0000 | ORAL_TABLET | ORAL | Status: DC | PRN
  Administered 2015-09-14: 2 via ORAL
  Filled 2015-09-13 (×2): qty 2

## 2015-09-13 MED ORDER — SIMETHICONE 80 MG PO CHEW: 80.0000 mg | CHEWABLE_TABLET | ORAL | Status: DC | PRN

## 2015-09-13 MED ORDER — OXYCODONE-ACETAMINOPHEN 5-325 MG PO TABS
1.0000 | ORAL_TABLET | ORAL | Status: DC | PRN
  Administered 2015-09-13 – 2015-09-15 (×2): 1 via ORAL
  Filled 2015-09-13 (×2): qty 1

## 2015-09-13 MED ORDER — OXYTOCIN BOLUS FROM INFUSION: 500.0000 mL | INTRAVENOUS | Status: DC

## 2015-09-13 MED ORDER — SENNOSIDES-DOCUSATE SODIUM 8.6-50 MG PO TABS
2.0000 | ORAL_TABLET | ORAL | Status: DC
Start: 2015-09-14 — End: 2015-09-15
  Administered 2015-09-13 – 2015-09-14 (×2): 2 via ORAL
  Filled 2015-09-13 (×2): qty 2

## 2015-09-13 NOTE — Anesthesia Procedure Notes (Signed)
Epidural Patient location during procedure: OB  Staffing Anesthesiologist: Faithlynn Deeley Performed by: anesthesiologist   Preanesthetic Checklist Completed: patient identified, site marked, surgical consent, pre-op evaluation, timeout performed, IV checked, risks and benefits discussed and monitors and equipment checked  Epidural Patient position: sitting Prep: site prepped and draped and DuraPrep Patient monitoring: continuous pulse ox and blood pressure Approach: midline Location: L3-L4 Injection technique: LOR saline  Needle:  Needle type: Tuohy  Needle gauge: 17 G Needle length: 9 cm and 9 Needle insertion depth: 5 cm cm Catheter type: closed end flexible Catheter size: 19 Gauge Catheter at skin depth: 10 cm Test dose: negative  Assessment Events: blood not aspirated, injection not painful, no injection resistance, negative IV test and no paresthesia  Additional Notes Patient identified. Risks/Benefits/Options discussed with patient including but not limited to bleeding, infection, nerve damage, paralysis, failed block, incomplete pain control, headache, blood pressure changes, nausea, vomiting, reactions to medication both or allergic, itching and postpartum back pain. Confirmed with bedside nurse the patient's most recent platelet count. Confirmed with patient that they are not currently taking any anticoagulation, have any bleeding history or any family history of bleeding disorders. Patient expressed understanding and wished to proceed. All questions were answered. Sterile technique was used throughout the entire procedure. Please see nursing notes for vital signs. Test dose was given through epidural catheter and negative prior to continuing to dose epidural or start infusion. Warning signs of high block given to the patient including shortness of breath, tingling/numbness in hands, complete motor block, or any concerning symptoms with instructions to call for help. Patient was  given instructions on fall risk and not to get out of bed. All questions and concerns addressed with instructions to call with any issues or inadequate analgesia.      

## 2015-09-13 NOTE — Anesthesia Preprocedure Evaluation (Signed)
Anesthesia Evaluation  Patient identified by MRN, date of birth, ID band Patient awake    Reviewed: Allergy & Precautions, NPO status , Patient's Chart, lab work & pertinent test results  History of Anesthesia Complications Negative for: history of anesthetic complications  Airway Mallampati: II  TM Distance: >3 FB Neck ROM: Full    Dental no notable dental hx. (+) Dental Advisory Given   Pulmonary former smoker,    Pulmonary exam normal breath sounds clear to auscultation       Cardiovascular negative cardio ROS Normal cardiovascular exam Rhythm:Regular Rate:Normal     Neuro/Psych negative neurological ROS  negative psych ROS   GI/Hepatic negative GI ROS, Neg liver ROS,   Endo/Other  diabetes, Poorly Controlled, Type 2, Insulin Dependent  Renal/GU negative Renal ROS  negative genitourinary   Musculoskeletal negative musculoskeletal ROS (+)   Abdominal   Peds negative pediatric ROS (+)  Hematology negative hematology ROS (+)   Anesthesia Other Findings   Reproductive/Obstetrics (+) Pregnancy                             Anesthesia Physical Anesthesia Plan  ASA: III  Anesthesia Plan: Epidural   Post-op Pain Management:    Induction:   Airway Management Planned:   Additional Equipment:   Intra-op Plan:   Post-operative Plan:   Informed Consent: I have reviewed the patients History and Physical, chart, labs and discussed the procedure including the risks, benefits and alternatives for the proposed anesthesia with the patient or authorized representative who has indicated his/her understanding and acceptance.   Dental advisory given  Plan Discussed with:   Anesthesia Plan Comments:         Anesthesia Quick Evaluation

## 2015-09-13 NOTE — Progress Notes (Signed)
Patient ID: Sandra Foster, female   DOB: 05-29-1991, 24 y.o.   MRN: [id number]  patient  is on 10 milliunits of Pitocin her cervix is 2-3 70   vertex -2-3 amniotomy performed and the fluid appears to be clear and IUPC was inserted and her latest blood sugar was 128 she is on sliding insulin scale and contracting every 2-3 minutes mild

## 2015-09-13 NOTE — Progress Notes (Signed)
Dr. Gaynell Face called and notified of pt SVE and CBG of 78. Orders to hold Glucostabilizer & conitnue to monitor blood sugars q 4 hrs.

## 2015-09-14 ENCOUNTER — Inpatient Hospital Stay (HOSPITAL_COMMUNITY): Admit: 2015-09-14 | Payer: Medicaid Other

## 2015-09-14 LAB — CBC
HCT: 26.5 % — ABNORMAL LOW (ref 36.0–46.0)
Hemoglobin: 9.1 g/dL — ABNORMAL LOW (ref 12.0–15.0)
MCH: 29.8 pg (ref 26.0–34.0)
MCHC: 34.3 g/dL (ref 30.0–36.0)
MCV: 86.9 fL (ref 78.0–100.0)
Platelets: 155 10*3/uL (ref 150–400)
RBC: 3.05 MIL/uL — ABNORMAL LOW (ref 3.87–5.11)
RDW: 14.7 % (ref 11.5–15.5)
WBC: 14.9 10*3/uL — ABNORMAL HIGH (ref 4.0–10.5)

## 2015-09-14 LAB — GLUCOSE, CAPILLARY: GLUCOSE-CAPILLARY: 95 mg/dL (ref 65–99)

## 2015-09-14 NOTE — Lactation Note (Deleted)
This note was copied from the chart of Sandra Foster. Lactation Consultation Note  Patient Name: Sandra Foster [id number]'F Date: 09/14/2015 Reason for consult: Follow-up assessment;NICU baby  NICU baby 74 hours old. Mom reports that she already pumped an ounce of EBM this morning. Enc mom to pump 8 times/24 hours for 15 minutes followed by hand expression. Mom states that she is visiting baby often. Enc mom to provide STS and nuzzling/latching at breast. Mom has been in contact with [facility] office for DEBP. Mom aware of OP/BFSG and LC phone line assistance after D/C.  Maternal Data    Feeding Feeding Type: Formula Nipple Type: Slow - flow Length of feed: 20 min  LATCH Score/Interventions                      Lactation Tools Discussed/Used     Consult Status Consult Status: PRN    [name] 09/14/2015, 9:56 AM

## 2015-09-14 NOTE — Progress Notes (Signed)
Patient ID: Sandra Foster, female   DOB: February 20, 1991, 24 y.o.   MRN: [id number] Postpartum day one Vital signs normal Fundus firm Lochia moderate Doing well

## 2015-09-14 NOTE — Progress Notes (Signed)
Introduced spiritual care services to patient who has a baby boy, Nycir, in the NICU.  Pt states she is feeling okay about it as long as he is okay.  She shared how surprised she was by his large size.  FOB is in the room as well and shared that he wishes the baby were with them and feelings of being unable to help.  Pt shared that Nycir is her 2nd child, her first was born still.  Chaplain offered support as birth often brings up memories of loss.  Pt shared that she is feeling okay and has come to terms with losing Zymir-stated that Nycir has an angel watching over him.   Please page as further needs arise.  Maryanna Shape. Carley Hammed, M.Div. [facility] Chaplain Pager [phone] Office [phone]   09/14/15 1139  Clinical Encounter Type  Visited With Patient and family together  Visit Type Initial  Referral From Chaplain  Spiritual Encounters  Spiritual Needs Emotional  Stress Factors  Patient Stress Factors Loss;Major life changes

## 2015-09-14 NOTE — Progress Notes (Signed)
Patient ID: Sandra Foster, female   DOB: 02-22-1991, 24 y.o.   MRN: [id number] Postpartum day one blood pressure 131/63 respiration 18 pulse 70 Fundus firm Lochia moderate Legs negative Doing well

## 2015-09-14 NOTE — Lactation Note (Signed)
This note was copied from the chart of Boy Megin Krigbaum. Lactation Consultation Note  Patient Name: Boy Neli Rhead [id number] Date: 09/14/2015 Reason for consult: Follow-up assessment;NICU baby NICU baby 55 hours old. Assisted mom to latch baby in NICU. Mom's breasts are pendulous, and nipples evert with stimulation. Assisted mom to latch baby to right breast in football position. Baby latch easily and deeply, suckling rhythmically with intermittent swallows noted. Mom reported some nipple pain, so demonstrated to parents how to flange baby's lower, and mom reports increased comfort. Demonstrated to parents how to stimulate baby to keep suckling at breast. Baby nursed well for 20 minutes.   Maternal Data    Feeding Feeding Type: Bottle Fed - Breast Milk Nipple Type: Slow - flow Length of feed: 20 min  LATCH Score/Interventions Latch: Grasps breast easily, tongue down, lips flanged, rhythmical sucking. Intervention(s): Adjust position;Assist with latch;Breast compression  Audible Swallowing: Spontaneous and intermittent Intervention(s): Skin to skin  Type of Nipple: Everted at rest and after stimulation  Comfort (Breast/Nipple): Soft / non-tender     Hold (Positioning): Assistance needed to correctly position infant at breast and maintain latch.  LATCH Score: 9  Lactation Tools Discussed/Used     Consult Status Consult Status: Follow-up Date: 09/15/15 Follow-up type: In-patient    [name] 09/14/2015, 3:43 PM

## 2015-09-14 NOTE — Lactation Note (Signed)
This note was copied from the chart of Sandra Foster. Lactation Consultation Note  Patient Name: Sandra Foster [id number] Date: 09/14/2015 Reason for consult: NICU baby;Initial assessment NICU baby 3 hours old. Mom reports that she pumped once last night before bed, and is currently pumping now. Discussed supply and demand, and enc pumping 8 times/24 hours for 15 minutes followed by hand expression. Mom states that she has been to NICU twice to put baby to breast and baby did well. According to mom, her milk came after her first child (a fetal demise). Mom given supplies, NICU booklet, and LC brochure with review. Mom gave permission for this LC to fax [facility] office a BF referral for DEBP, and referral was faxed. Enc mom to call for [facility] assistance with latching baby in NICU as needed. Mom aware of OP/BFSG and LC phone line assistance after D/C.   Maternal Data    Feeding    LATCH Score/Interventions                      Lactation Tools Discussed/Used WIC Program: Yes Pump Review: Setup, frequency, and cleaning;Milk Storage Initiated by:: Bedside RN. Date initiated:: 09/13/15   Consult Status Consult Status: Follow-up Date: 09/15/15 Follow-up type: In-patient    [name] 09/14/2015, 10:31 AM

## 2015-09-14 NOTE — Clinical Social Work Maternal (Signed)
CLINICAL SOCIAL WORK MATERNAL/CHILD NOTE  Patient Details  Name: Sandra Foster MRN: [id number] Date of Birth: 03/08/1991  Date:  2014/12/14  Clinical Social Worker Initiating Note:  Analya Louissaint E. Brigitte Pulse,  Date/ Time Initiated:  09/14/15/1000     Child's Name:  Sandra Foster   Legal Guardian:   (Parents: Carver Fila and Beaulah Dinning)   Need for Interpreter:  None   Date of Referral:  24-Mar-2015     Reason for Referral:  Other (Comment) (Patient lives at [facility].  Previous term loss.)   Referral Source:  RN   Address:  FOB reports that his mother just moved in to her new home where they will be staying and he does not currently know the address.  He will ask his mom and provide staff with address.  Phone number:  [phone]   Household Members:  Significant Other, Parents (Parents will be living with PGM)   Natural Supports (not living in the home):  Immediate Family, Extended Family, Friends (Parents report having a good support system.)   Professional Supports: Shelter (MOB has been living at [facility] and reports great support from this program.  She will stay 2-3 nights post discharge there before leaving the program.  )   Employment:  (FOB works at [employer] and states he has taken a week off.  MOB was working at [employer] and plans to return after maternity leave.)   Type of Work:     Education:      Museum/gallery curator Resources:  [employer]   Other Resources:  Physicist, medical , [place] Considerations Which May Impact Care: None stated.  MOB's facesheet states religion as Psychologist, forensic.  Strengths:  Ability to meet basic needs , Compliance with medical plan , Understanding of illness, Home prepared for child  (MOB states she has a pediatrician list and that they will be making a decision soon.  FOB asked for assistance with diapers and wipes.  CSW made referral to [facility] Network.)   Risk Factors/Current Problems:  None    Cognitive State:  Alert , Linear Thinking , Goal Oriented , Insightful , Able to Concentrate    Mood/Affect:  Comfortable , Relaxed , Calm , Interested    CSW Assessment: CSW met with baby's parents in MOB's third floor room/305 to offer support and complete assessment due to baby's admission to NICU at 38.6 weeks.  CSW is familiar with MOB from meeting with her while a patient on the Antenatal Unit earlier this month.  This is CSW's first time meeting FOB.  MOB states she recalls speaking with CSW and smiled when CSW arrived.  She reports that this is a good time to talk and that CSW can talk openly with FOB present.   Parents report feeling well and state baby is doing well.  Their main concern is how long baby's with low blood sugar have to stay in the hospital.  CSW explained that every baby is different and that their son will be the one who determines his discharge date.  FOB states he has taken a week off of work and wants to be at home with his son.  CSW validated this concern and suggests he consider working until baby goes home and taking his time off then.  He states he is unsure that he will be able to change his schedule at this time.  CSW encouraged parents to focus on their baby rather than his surroundings.  CSW also discussed trying not to have expectations regarding his discharge date to hopefully lessen letdown if they set an expectation that baby does not meet.  CSW acknowledged that this time is especially hard since everyone wants their baby home for Thanksgiving next week.  Parents seem to have a good understanding of baby's need for ICU treatment.   CSW provided education on perinatal mood disorders and asked them to contact CSW and or MD if they have concerns about mental health at any time.  MOB spoke a little about her loss 4 years ago (much less today than on initial meeting with CSW), but reports that she did not become depressed.  She states she "shut down and would  not talk for a week."  FOB states, "I was depressed."  CSW validated these feelings and grief reactions.  MOB states she will talk with Dr. Marshall if she has any concerns in the future.   CSW also provided education on SIDS precautions.  Parents state they have a crib and a bassinet for baby and commit to putting him to bed on his back in his own sleep environment 100% of the time.  MOB added that baby likes to sleep on his back so far.  CSW asked about smoking in the home.  FOB and his mother smoke.  CSW made the recommendation that no one smoke in the home and that they change their clothes and wash their hands after smoking/before handling baby.  FOB stated understanding.   Parents report having all needed items for baby at home, however, MOB states she will [facility] in a day or two post discharge.  She plans to stay at [facility] for 2-3 nights post discharge before moving in with FOB and PGM.  MOB states [facility] will be giving her a referral to [employer] [facility] for furniture in her new home.  CSW inquired about transportation to the hospital to be with baby after MOB's discharge.  FOB states he plans to buy a car on Monday.  They report need for bus passes until that time.  CSW will provide. CSW explained ongoing support services offered by NICU CSW and gave contact information.  Parents seemed appreciative of the support offered and state no questions, concerns or needs at this time.  CSW has no social concerns at this time and identifies no barriers to discharge when baby is medically ready.  CSW Plan/Description:  Patient/Family Education , Psychosocial Support and Ongoing Assessment of Needs, Information/Referral to Community Resources     Diem Dicocco Elizabeth, LCSW 09/14/2015, 11:40 AM 

## 2015-09-14 NOTE — Anesthesia Postprocedure Evaluation (Signed)
  Anesthesia Post-op Note  Patient: Sandra Foster  Procedure(s) Performed: * No procedures listed *  Patient Location: PACU and Women's Unit  Anesthesia Type:Epidural  Level of Consciousness: awake, alert  and oriented  Airway and Oxygen Therapy: Patient Spontanous Breathing  Post-op Pain: mild  Post-op Assessment: Patient's Cardiovascular Status Stable, Respiratory Function Stable, No signs of Nausea or vomiting, Adequate PO intake, Pain level controlled, No headache, No backache and Patient able to bend at knees              Post-op Vital Signs: Reviewed and stable  Last Vitals:  Filed Vitals:   09/14/15 0610  BP: 108/60  Pulse: 74  Temp: 36.9 C  Resp: 16    Complications: No apparent anesthesia complications

## 2015-09-15 ENCOUNTER — Ambulatory Visit (HOSPITAL_COMMUNITY): Payer: Medicaid Other

## 2015-09-15 NOTE — Discharge Summary (Signed)
Obstetric Discharge Summary Reason for Admission: induction of labor Prenatal Procedures: none Intrapartum Procedures: spontaneous vaginal delivery Postpartum Procedures: none Complications-Operative and Postpartum: none HEMOGLOBIN  Date Value Ref Range Status  09/14/2015 9.1* 12.0 - 15.0 g/dL Final    Comment:    DELTA CHECK NOTED REPEATED TO VERIFY    HCT  Date Value Ref Range Status  09/14/2015 26.5* 36.0 - 46.0 % Final    Physical Exam:  General: alert Lochia: appropriate Uterine Fundus: firm Incision: healing well DVT Evaluation: No evidence of DVT seen on physical exam.  Discharge Diagnoses: Term Pregnancy-delivered  Discharge Information: Date: 09/15/2015 Activity: pelvic rest Diet: routine Medications: Percocet Condition: stable Instructions: refer to practice specific booklet Discharge to: home Follow-up Information    Follow up with Kathreen Cosier, MD.   Specialty:  Obstetrics and Gynecology   Contact information:   [address] [place] [address] [phone]       Newborn Data: Live born female  Birth Weight: 10 lb 4 oz (4649 g) APGAR: 8, 9  Home with mother.  Annely Sliva A 09/15/2015, 6:31 AM

## 2015-09-15 NOTE — Lactation Note (Signed)
This note was copied from the chart of Sandra Lakeshia Dier. Lactation Consultation Note  Patient Name: Sandra Foster [id number] Date: 09/15/2015 Reason for consult: Pump rental Issued mom the $30 two week [facility] loaner DEBP. Her paper work and mom were file per policy.   Maternal Data    Feeding Feeding Type: Formula Nipple Type: Slow - flow Length of feed: 25 min  LATCH Score/Interventions                      Lactation Tools Discussed/Used Pump Review: Setup, frequency, and cleaning;Milk Storage;Other (comment) (return policy and location) Initiated by:: ES Date initiated:: 09/15/15   Consult Status Consult Status: PRN Date: 09/16/15 Follow-up type: In-patient    Rulon Eisenmenger 09/15/2015, 5:41 PM

## 2015-09-15 NOTE — Progress Notes (Signed)
Patient ID: Sandra Foster, female   DOB: 14-Oct-1991, 24 y.o.   MRN: [id number] Postpartum day 2 Blood pressure 101/62 afebrile pulse 72 respiration 18 Fundus firm Lochia moderate Legs negative doing well home today

## 2015-09-15 NOTE — Discharge Instructions (Signed)
Discharge instructions   You can wash your hair  Shower  Eat what you want  Drink what you want  See me in 6 weeks  Your ankles are going to swell more in the next 2 weeks than when pregnant  No sex for 6 weeks   Tristen Luce A, MD 09/15/2015

## 2015-09-15 NOTE — Progress Notes (Signed)
Teaching complete  Breast pump rented   And pt plans to leave soon

## 2015-09-15 NOTE — Lactation Note (Signed)
This note was copied from the chart of Sandra Foster. Lactation Consultation Note  Patient Name: Sandra Foster Date: 09/15/2015 Reason for consult: Follow-up assessment;NICU baby NICU baby 42 hours old. Mom requesting DEBP WIC loaner--has appointment for later next week due to low supply of pumps at [facility] office. Mom will call for pump when ready with papers and money. Mom states that she is motivated to produce lots of breast milk for her baby. Enc keeping her pumping schedule, and enc offering baby the breast as she is able. Mom given additional bottles and reviewed EBM storage guidelines and how to transport milk to NICU. Mom aware of OP/BFSG and LC phone line assistance after D/C.   Maternal Data    Feeding Feeding Type: Formula Nipple Type: Regular Length of feed: 25 min  LATCH Score/Interventions                      Lactation Tools Discussed/Used     Consult Status Consult Status: PRN    [name] 09/15/2015, 11:02 AM

## 2015-09-15 NOTE — Final Progress Note (Signed)
Pt ambulated  Out with boyfriend

## 2015-09-29 ENCOUNTER — Other Ambulatory Visit (HOSPITAL_COMMUNITY): Payer: Self-pay

## 2016-06-24 IMAGING — US US RENAL
1 series · 14 of 25 positions shown · non-contrast
Comparison: None.

CLINICAL DATA: Left flank pain.  Pregnancy.

EXAM:
RENAL / URINARY TRACT ULTRASOUND COMPLETE

[Series 1: us renal · 73 acquisitions, 14 frames shown]
[im 1/73]
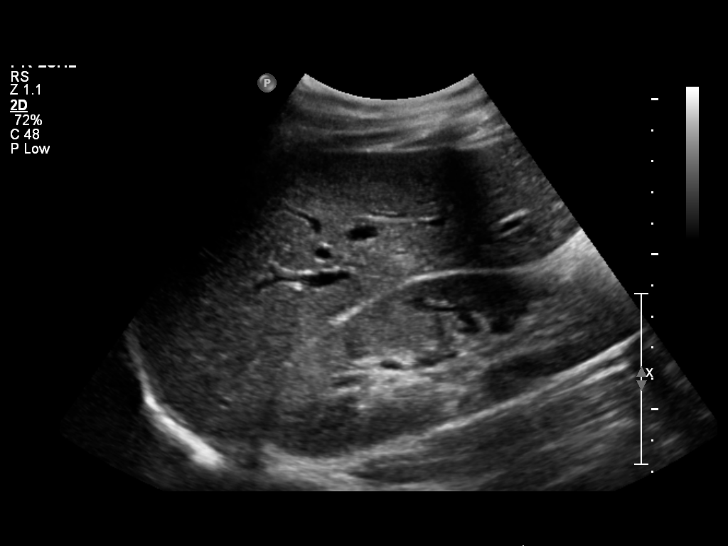
[im 7/73]
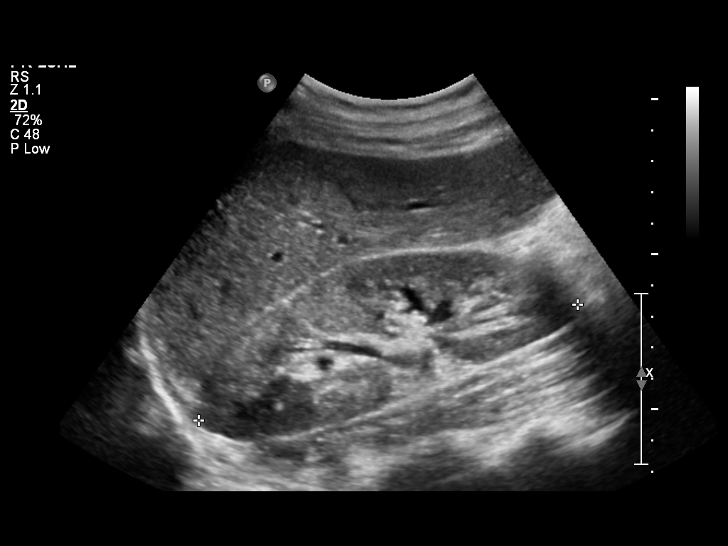
[im 13/73]
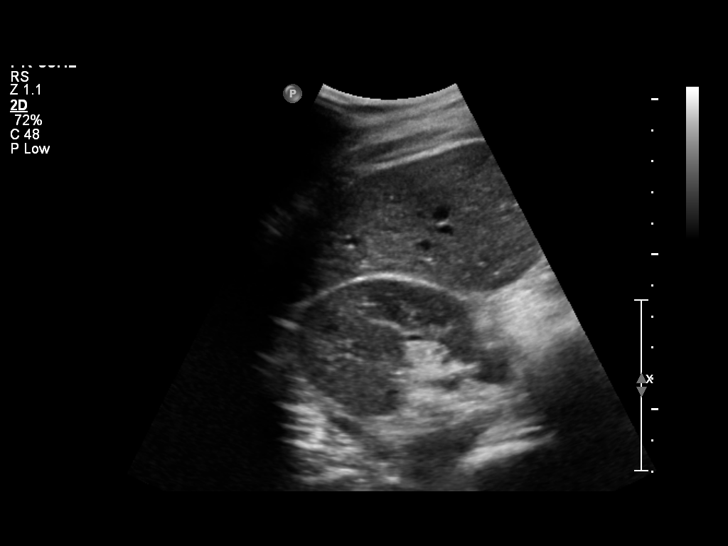
[im 19/73]
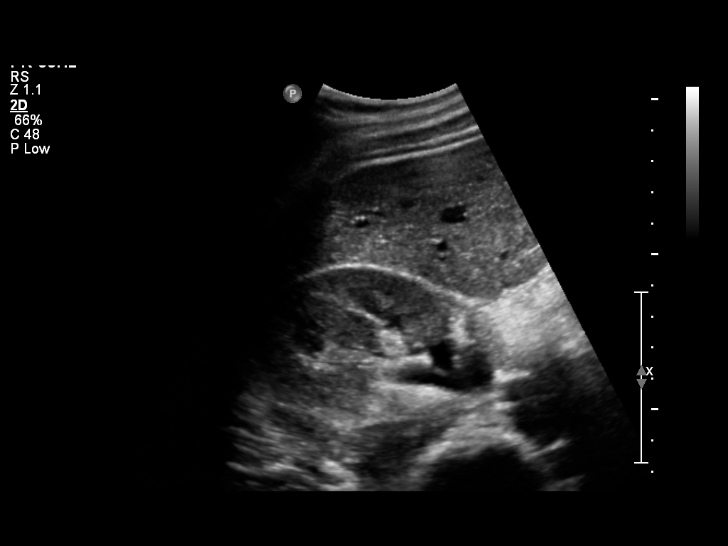
[im 25/73]
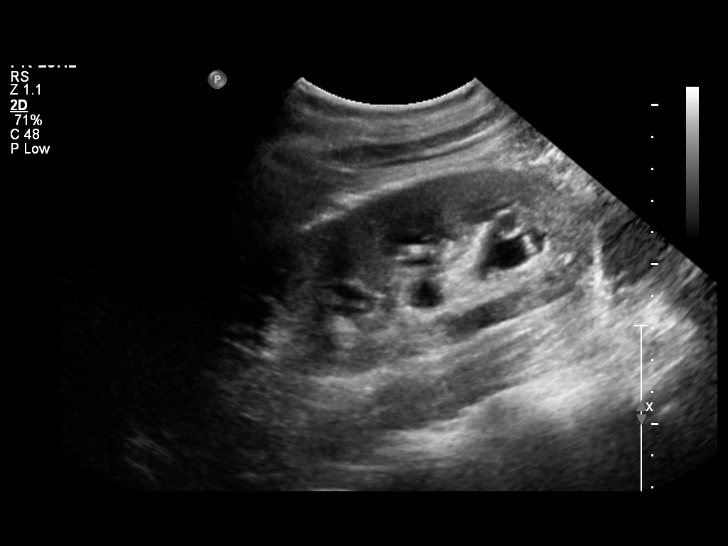
[im 28/73]
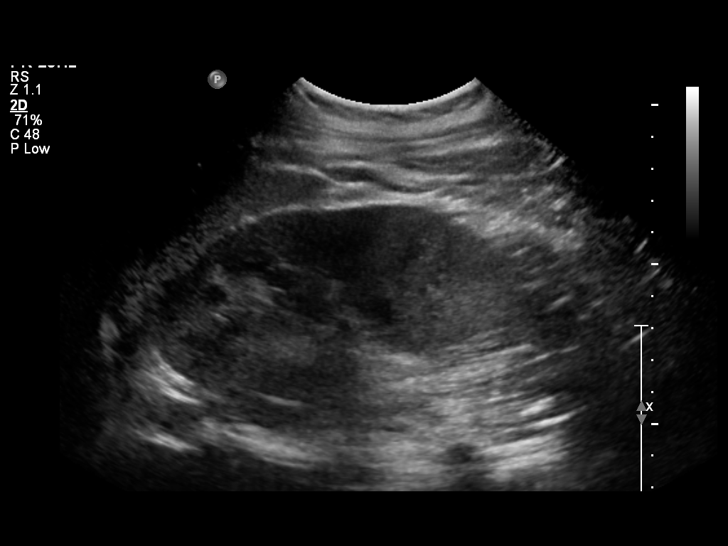
[im 34/73]
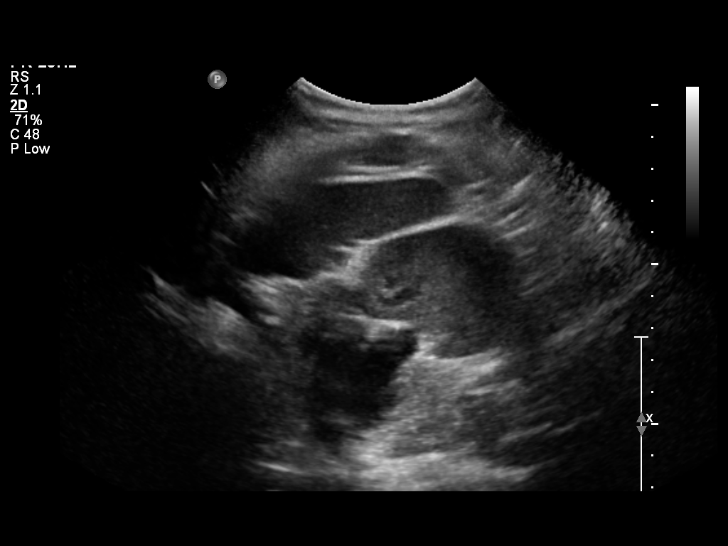
[im 40/73]
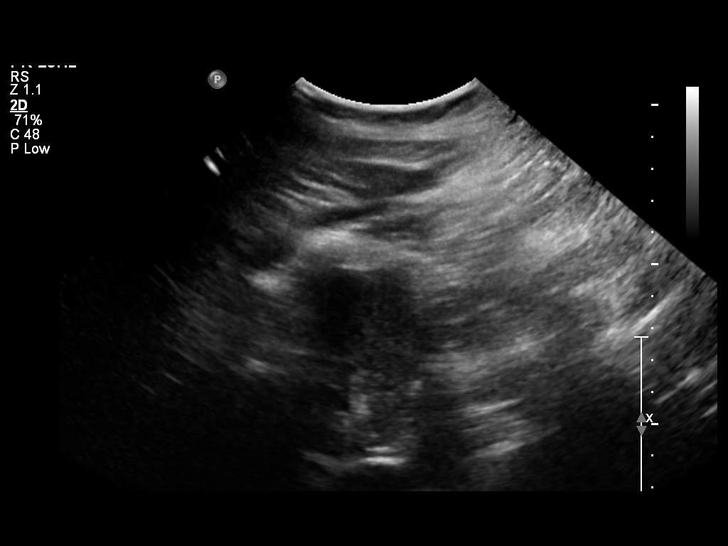
[im 46/73]
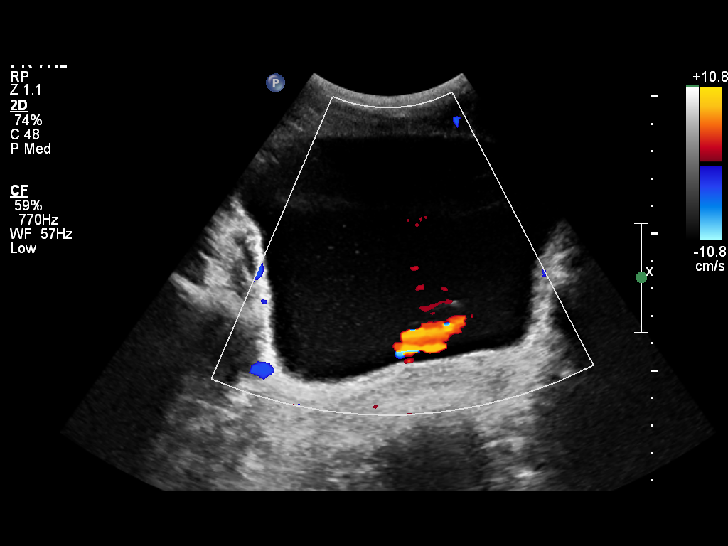
[im 49/73]
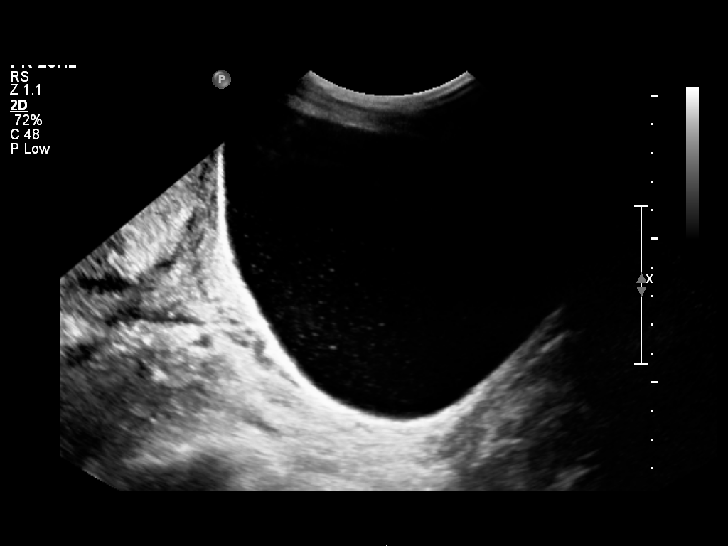
[im 55/73]
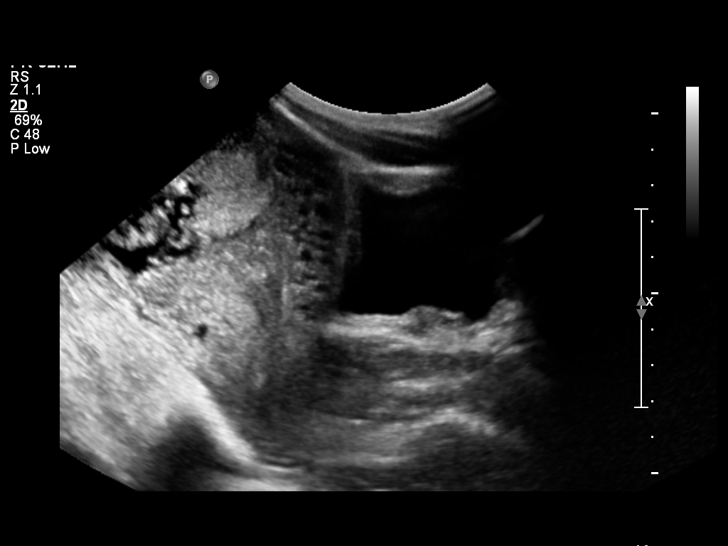
[im 61/73]
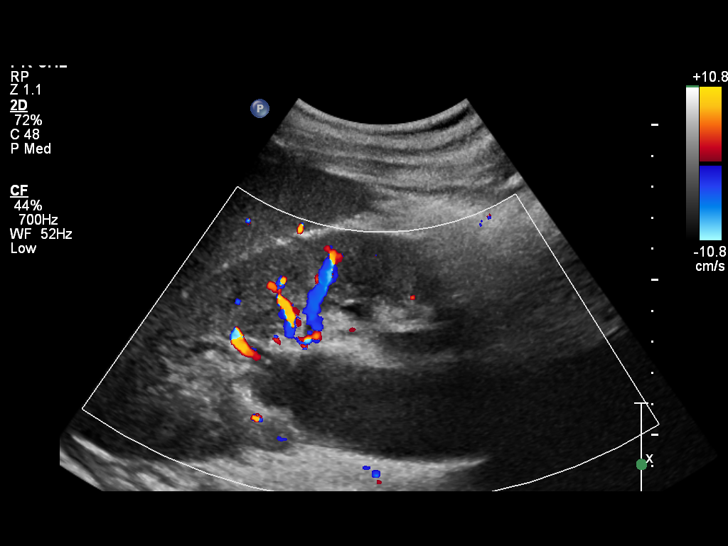
[im 67/73]
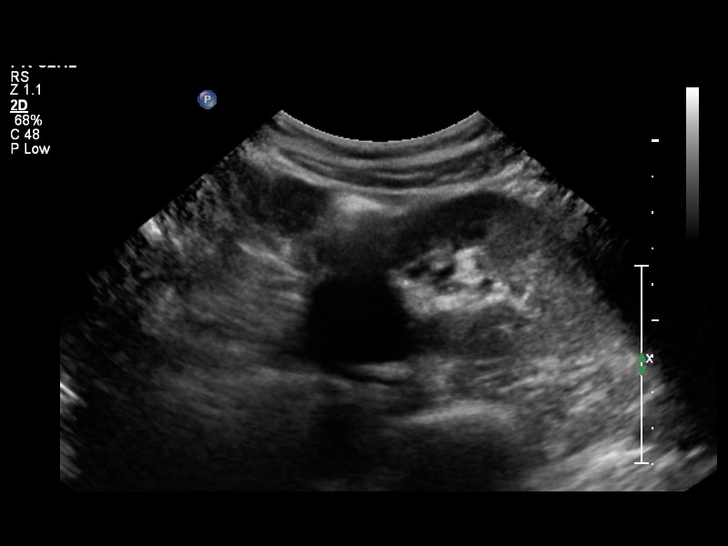
[im 73/73]
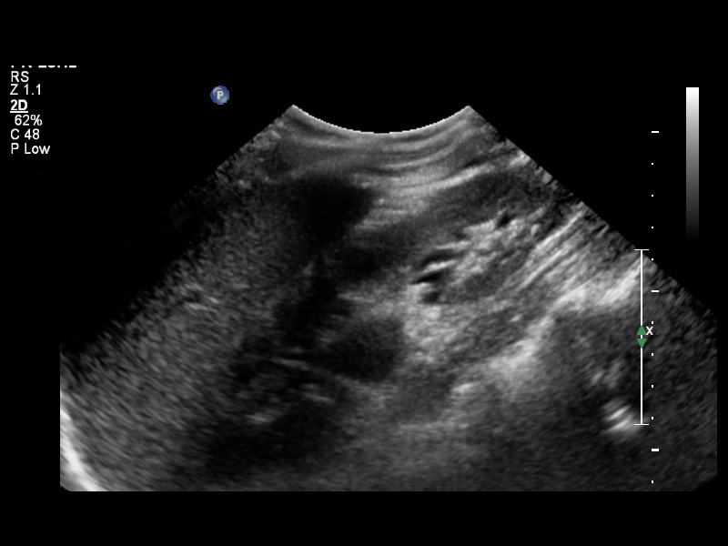

[14 of 25 positions shown; findings below may reference images not displayed]

FINDINGS: Right Kidney:

Length: 12.7 cm. Echogenicity within normal limits. Borderline
hydronephrosis and mild proximal hydroureter.

Left Kidney:

Length: 12.7 cm. Echogenicity within normal limits. Mild
hydronephrosis and prominent hydroureter, measuring about 3.5 cm in
diameter proximally.

Bladder:

Appears normal for degree of bladder distention. Bilateral ureteral
jets are present.
IMPRESSION: 1. Prominent hydroureter and mild hydronephrosis on the left side.
The cause of the obstruction is not visualize; ureteral jets were
visible bilaterally and accordingly the presumptive obstruction may
be transient/intermittent, or partial.
2. There is borderline right hydronephrosis and mild right proximal
hydroureter as well.

## 2016-06-30 ENCOUNTER — Encounter: Payer: Self-pay | Admitting: *Deleted

## 2016-06-30 LAB — LAB REPORT - SCANNED: PAP SMEAR: NEGATIVE

## 2016-09-10 ENCOUNTER — Other Ambulatory Visit: Payer: Self-pay | Admitting: Certified Nurse Midwife

## 2016-11-04 IMAGING — US US MFM FETAL BPP W/O NON-STRESS
1 series · 10 of 10 positions shown · non-contrast
Comparison: none

[Series 1: us mfm fetal bpp w/o non-stress · 10 acquisitions, 10 frames shown]
[im 1/10]
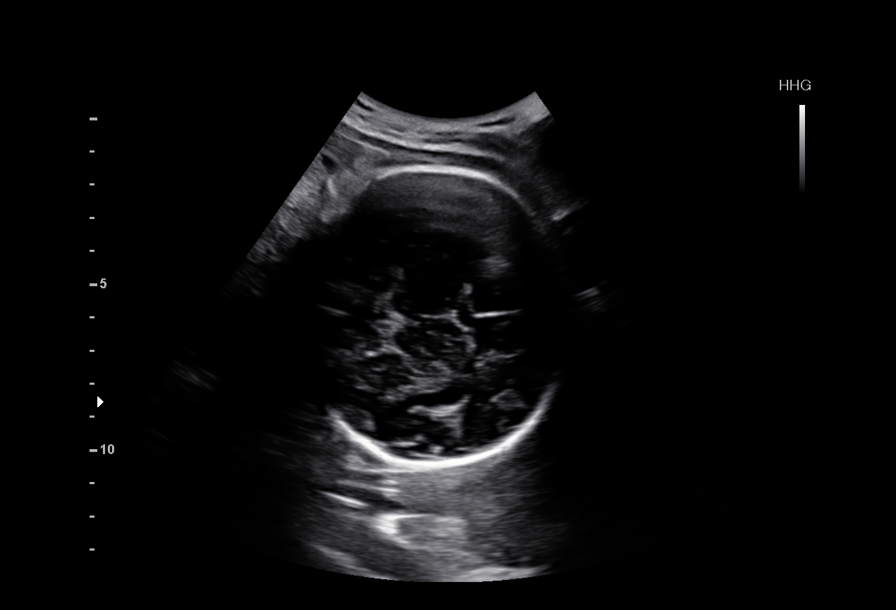
[im 2/10]
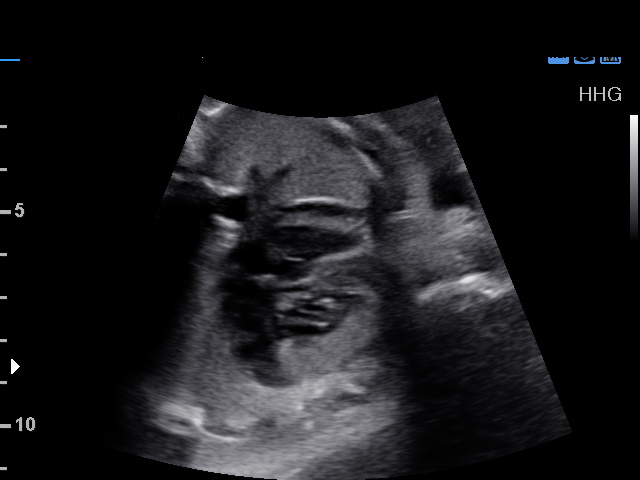
[im 3/10]
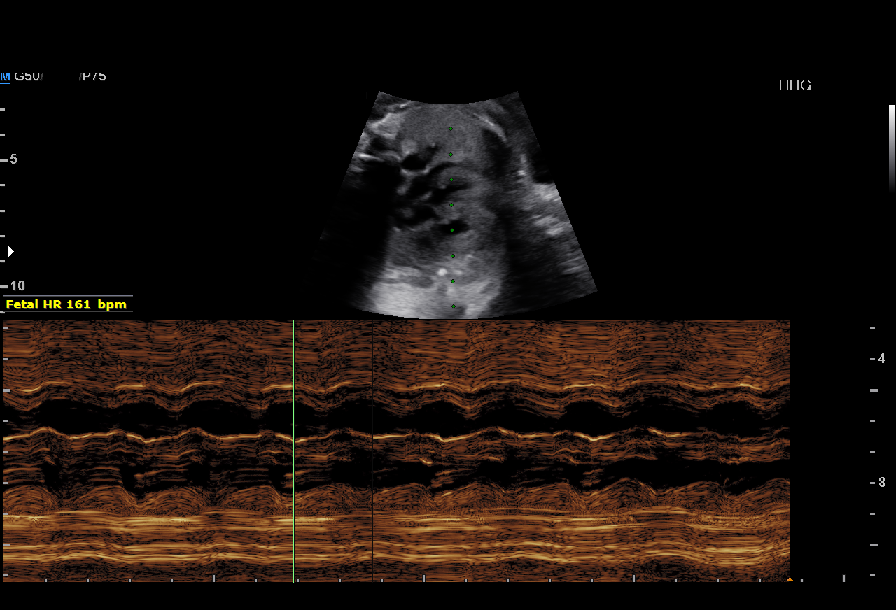
[im 4/10]
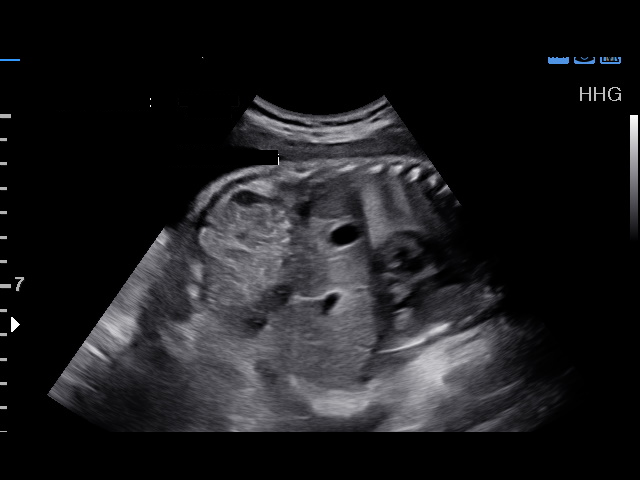
[im 5/10]
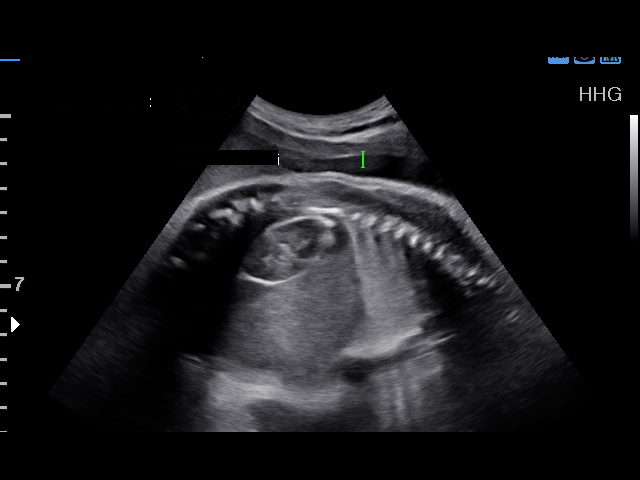
[im 6/10]
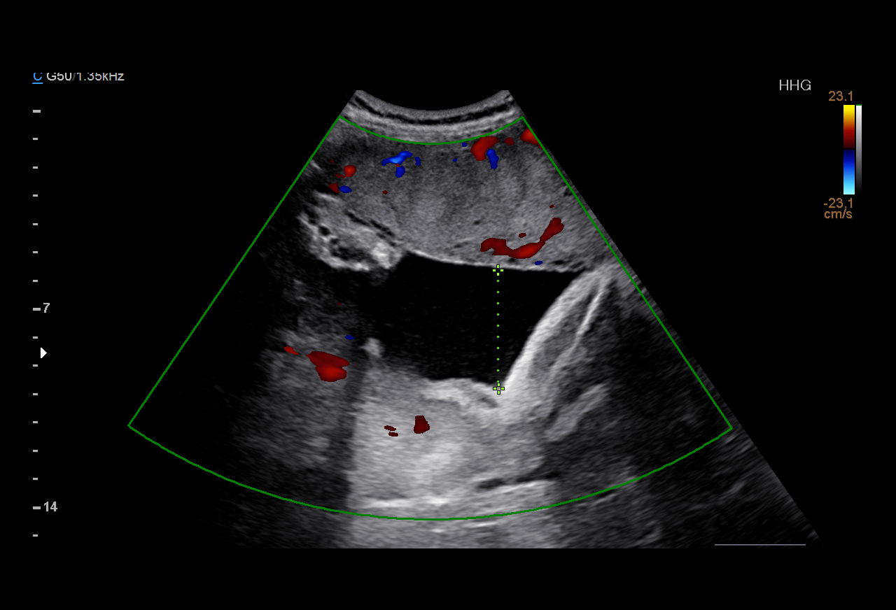
[im 7/10]
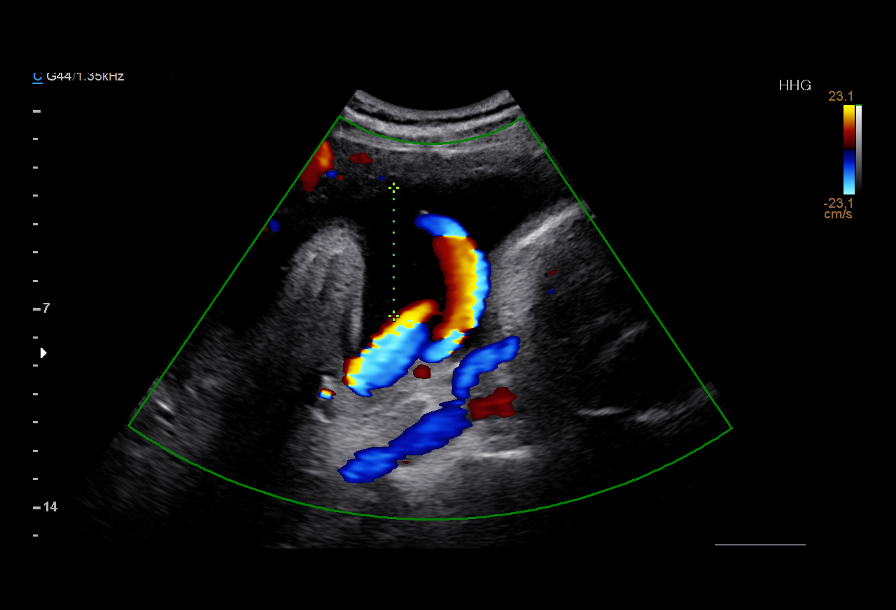
[im 8/10]
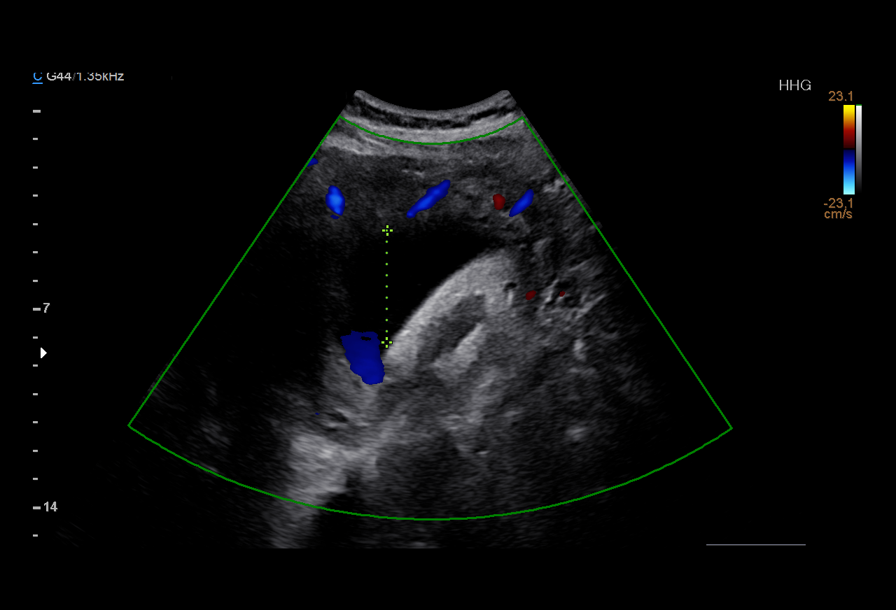
[im 9/10]
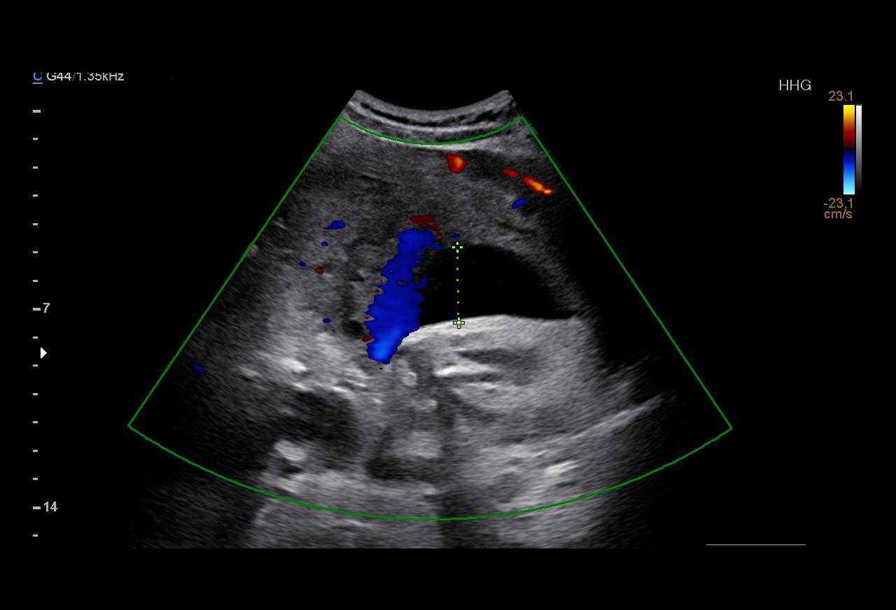
[im 10/10]
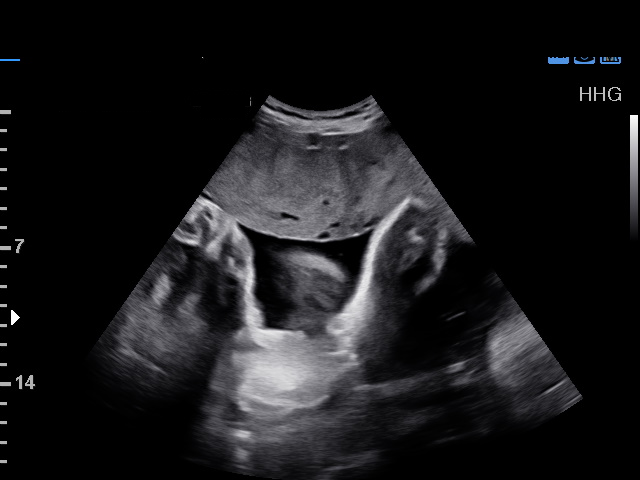

[10 of 10 positions shown; findings below may reference images not displayed]

OBSTETRICS REPORT
(Signed Final 08/11/2015 [DATE])

Name:       HASILE MULETA                       Visit  08/11/2015 [DATE]
Date:

Service(s) Provided

Indications

Poor obstetric history: Previous IUFD at 39
weeks due to hyperosmolar hyperglycemia
nonketotic syndrome
Pyelectasis of fetus on prenatal ultrasound
34 weeks gestation of pregnancy
Hypertension - Gestational
Gestational diabetes in pregnancy, insulin
controlled
Fetal Evaluation

Num Of             1
Fetuses:
Fetal Heart        161                          bpm
Rate:
Cardiac Activity:  Observed
Presentation:      Cephalic

Comment     BPP [DATE]
:

Amniotic Fluid
AFI FV:      Subjectively within normal limits
AFI Sum:     15.28    cm      55  %Tile     Larg Pckt:    4.51   cm
RUQ:   4.17    cm    RLQ:   2.66    cm   LUQ:    4.51    cm   LLQ:    3.94   cm
Biophysical Evaluation

Amniotic F.V:   Pocket => 2 cm two          F. Tone:        Observed
planes
F. Movement:    Observed                    Score:          [DATE]
F. Breathing:   Observed
Gestational Age

LMP:           35w 3d        Date:  12/06/14                  EDD:   09/12/15
Best:          34w 1d    Det. By:   Early Ultrasound          EDD:   09/21/15
Impression

Single living intrauterine pregnancy at 34 weeks 1 day.
Normal amniotic fluid volume.
BPP [DATE].
Recommendations

Continue weekly BPPs.

GILEN with us.  Please do not hesitate to contact

## 2016-12-13 ENCOUNTER — Encounter (HOSPITAL_COMMUNITY): Payer: Self-pay

## 2016-12-13 ENCOUNTER — Emergency Department (HOSPITAL_COMMUNITY)
Admission: EM | Admit: 2016-12-13 | Discharge: 2016-12-13 | Disposition: A | Discharge status: Home / Self Care | Payer: Medicaid Other | Attending: Emergency Medicine | Admitting: Emergency Medicine

## 2016-12-13 DIAGNOSIS — R6889 Other general symptoms and signs: Secondary | ICD-10-CM

## 2016-12-13 DIAGNOSIS — R05 Cough: Secondary | ICD-10-CM | POA: Insufficient documentation

## 2016-12-13 DIAGNOSIS — J029 Acute pharyngitis, unspecified: Secondary | ICD-10-CM | POA: Insufficient documentation

## 2016-12-13 MED ORDER — OXYMETAZOLINE HCL 0.05 % NA SOLN: 1.0000 | Freq: Two times a day (BID) | NASAL | 0 refills | Status: DC

## 2016-12-13 MED ORDER — BENZONATATE 100 MG PO CAPS: 200.0000 mg | ORAL_CAPSULE | Freq: Two times a day (BID) | ORAL | 0 refills | Status: DC | PRN

## 2016-12-13 NOTE — ED Notes (Signed)
C/o sore throat onset 2 days ago with generalized bodyaches

## 2016-12-13 NOTE — ED Provider Notes (Signed)
[facility]-EMERGENCY DEPT Provider Note   CSN: [id number] Arrival date & time: 12/13/16  0755     History   Chief Complaint Chief Complaint  Patient presents with  . cough, ear pain    HPI Sandra Foster is a 26 y.o. female.  HPI   Patient is a 26 year old female with no pertinent past medical history presents to the ED with multiple complaints. Patient reports 2 days ago she began having generalized body aches, cough, nasal congestion, rhinorrhea, ear pain and sore throat. Patient notes her symptoms have gradually worsened over the past 2 days. Patient reports taking over-the-counter TheraFlu at home without relief. Denies fever, headache, dizziness, trismus, drooling, hemoptysis, shortness of breath, wheezing, chest pain, palpitations, abdominal pain, vomiting, diarrhea, urinary symptoms, rash. Denies any known sick contacts.  Past Medical History:  Diagnosis Date  . Chlamydia   . Fetal demise    hyperglycemic hyperosmolar nonketonic syndrome  . Gestational diabetes   . Hernia   . Pregnancy induced hypertension   . Pyelonephritis     Patient Active Problem List   Diagnosis Date Noted  . Pregnancy 09/13/2015  . NVD (normal vaginal delivery) 09/13/2015  . Normal labor and delivery 09/12/2015  . Diabetes in undelivered pregnancy 08/30/2015  . 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 [redacted] weeks gestation of pregnancy   . Prior pregnancy with fetal demise and current pregnancy in second trimester   . Encounter for fetal anatomic survey     Past Surgical History:  Procedure Laterality Date  . HERNIA REPAIR      OB History    Gravida Para Term Preterm AB Living   3 2 2   1 1    SAB TAB Ectopic Multiple Live Births   0 1   0 1       Home Medications    Prior to Admission medications   Medication Sig Start Date End Date Taking? Authorizing Provider  benzonatate (TESSALON) 100 MG capsule Take 2 capsules (200 mg total) by mouth 2 (two) times daily as needed  for cough. 12/13/16   Barrett Henle, PA-C  oxymetazoline (AFRIN NASAL SPRAY) 0.05 % nasal spray Place 1 spray into both nostrils 2 (two) times daily. Spray once into each nostril twice daily for up to the next 3 days. Do not use for more than 3 days to prevent rebound rhinorrhea. 12/13/16   Barrett Henle, PA-C    Family History Family History  Problem Relation Age of Onset  . Hypertension Maternal Grandmother     Social History Social History  Substance Use Topics  . Smoking status: Former Smoker    Packs/day: 0.25  . Smokeless tobacco: Former Neurosurgeon    Quit date: 06/10/2011  . Alcohol use No     Allergies   Patient has no known allergies.   Review of Systems Review of Systems  HENT: Positive for congestion, ear pain, rhinorrhea and sore throat.   Respiratory: Positive for cough.   Musculoskeletal: Positive for myalgias (generalized).  All other systems reviewed and are negative.    Physical Exam Updated Vital Signs BP (!) 115/53 (BP Location: Left Arm)   Pulse 78   Temp 98.2 F (36.8 C) (Oral)   Resp 18   SpO2 97%   Physical Exam  Constitutional: She is oriented to person, place, and time. She appears well-developed and well-nourished.  HENT:  Head: Normocephalic and atraumatic.  Mouth/Throat: Uvula is midline, oropharynx is clear and moist and mucous  membranes are normal. No oropharyngeal exudate, posterior oropharyngeal edema, posterior oropharyngeal erythema or tonsillar abscesses. No tonsillar exudate.  No trismus, drooling, facial/neck swelling or stridor on exam. No muffled voice. Floor of mouth soft.    Eyes: Conjunctivae and EOM are normal. Pupils are equal, round, and reactive to light. Right eye exhibits no discharge. Left eye exhibits no discharge. No scleral icterus.  Neck: Normal range of motion. Neck supple.  Cardiovascular: Normal rate, regular rhythm, normal heart sounds and intact distal pulses.   Pulmonary/Chest: Effort normal and  breath sounds normal. No respiratory distress. She has no wheezes. She has no rales. She exhibits no tenderness.  Abdominal: Soft. Bowel sounds are normal. She exhibits no distension and no mass. There is no tenderness. There is no rebound and no guarding.  Musculoskeletal: Normal range of motion. She exhibits no edema.  Lymphadenopathy:    She has no cervical adenopathy.  Neurological: She is alert and oriented to person, place, and time.  Skin: Skin is warm and dry.  Nursing note and vitals reviewed.    ED Treatments / Results  Labs (all labs ordered are listed, but only abnormal results are displayed) Labs Reviewed - No data to display  EKG  EKG Interpretation  Date/Time:  Thursday December 13 2016 08:05:38 EST Ventricular Rate:  71 PR Interval:  112 QRS Duration: 80 QT Interval:  398 QTC Calculation: 432 R Axis:   79 Text Interpretation:  Normal sinus rhythm with sinus arrhythmia T wave abnormality, consider inferior ischemia Abnormal ECG No old tracing to compare Confirmed by [facility]  MD, ELLIOTT ([id number]) on 12/13/2016 8:51:08 AM       Radiology No results found.  Procedures Procedures (including critical care time)  Medications Ordered in ED Medications - No data to display   Initial Impression / Assessment and Plan / ED Course  I have reviewed the triage vital signs and the nursing notes.  Pertinent labs & imaging results that were available during my care of the patient were reviewed by me and considered in my medical decision making (see chart for details).      Patient with symptoms consistent with influenza vs viral illness.  Vitals are stable, afebrile.  No signs of dehydration, tolerating PO's.  Lungs are clear. Due to patient's presentation and physical exam a chest x-ray was not ordered bc likely diagnosis of flu.  Discussed the cost versus benefit of Tamiflu treatment with the patient.  The patient understands that symptoms are greater than the recommended  24-48 hour window of treatment.  Patient will be discharged with instructions to orally hydrate, rest, and use over-the-counter medications such as anti-inflammatories ibuprofen and Aleve for muscle aches and Tylenol for fever.  Patient will also be given a cough suppressant and decongestant. Advised patient to follow up with PCP as needed.    EKG performed in triage showed NSR with T wave inversions in inferior leads. Pt denies CP but reports having body aches. Pt reports smoking 4 cigarettes per day. Denies personal or family history of cardiac disease. Denies history of hypertension, high cholesterol or diabetes. Pt low risk for cardiac disease. I have a low suspicion for ACS, PE, dissection, or other acute cardiac event at this time; however due to patient with abnormal EKG and no prior EKG to compare plan to have patient follow up with PCP within the next week for repeat evaluation and repeat EKG. Discussed results and plan with patient. Discussed return precautions with patient.    MDM  Number of Diagnoses or Management Options Flu-like symptoms:    Final Clinical Impressions(s) / ED Diagnoses   Final diagnoses:  Flu-like symptoms    New Prescriptions New Prescriptions   BENZONATATE (TESSALON) 100 MG CAPSULE    Take 2 capsules (200 mg total) by mouth 2 (two) times daily as needed for cough.   OXYMETAZOLINE (AFRIN NASAL SPRAY) 0.05 % NASAL SPRAY    Place 1 spray into both nostrils 2 (two) times daily. Spray once into each nostril twice daily for up to the next 3 days. Do not use for more than 3 days to prevent rebound rhinorrhea.      Satira Sark [place], [place] 12/13/16 [id number]    Mancel Bale, MD 12/13/16 [phone]

## 2016-12-13 NOTE — ED Triage Notes (Signed)
Patient complains of 2 days of cough, congestion , ear pain and throat burning with swallowing x 2 days, complains of chest pain with cough, NAD.

## 2016-12-13 NOTE — Discharge Instructions (Signed)
Take your medications as prescribed. I also recommend taking Tylenol and ibuprofen as prescribed over-the-counter, alternating between doses every 3-4 hours. Continue drinking fluids at home to remain hydrated. I recommend eating a bland diet for the next few days and taper symptoms have improved. Follow-up with your primary care provider in the next 3-4 days if symptoms have not improved. Return to the emergency department if symptoms worsen or new onset of headache, neck stiffness, difficulty breathing, coughing up blood, chest pain, abdominal pain, vomiting, unable to keep fluids down.   I also recommend following up with the primary care provider's office listed below within the next week for follow-up evaluation regarding your abnormal EKG seen in the emergency department today. Return to the emergency department if he began having new or worsening chest pain, difficulty breathing, coughing up blood, heart palpitations, dizziness, vomiting, numbness, tingling, weakness.

## 2017-12-13 ENCOUNTER — Encounter (HOSPITAL_COMMUNITY): Payer: Self-pay | Admitting: *Deleted

## 2017-12-13 ENCOUNTER — Other Ambulatory Visit: Payer: Self-pay

## 2017-12-13 ENCOUNTER — Emergency Department (HOSPITAL_COMMUNITY)
Admission: EM | Admit: 2017-12-13 | Discharge: 2017-12-13 | Discharge status: Against Medical Advice | Payer: Self-pay | Attending: Emergency Medicine | Admitting: Emergency Medicine

## 2017-12-13 DIAGNOSIS — M545 Low back pain: Secondary | ICD-10-CM | POA: Insufficient documentation

## 2017-12-13 DIAGNOSIS — Z5321 Procedure and treatment not carried out due to patient leaving prior to being seen by health care provider: Secondary | ICD-10-CM | POA: Insufficient documentation

## 2017-12-13 NOTE — ED Triage Notes (Signed)
Pt in c/o Mid lower back pain onset x 2 wks that radiates to R buttocks, pt reports no relief with OTC meds, denies bowel and bladder incontinence, denies dysuria, A&O x4, ambulatory

## 2017-12-13 NOTE — ED Notes (Signed)
Pt came to desk and said she wanted to "cancel" she had to go to work

## 2017-12-13 NOTE — ED Notes (Signed)
Pt approached desk to inform this tech that she is "stepping outside for a minute to get some fresh air"

## 2019-06-06 ENCOUNTER — Emergency Department (HOSPITAL_COMMUNITY)
Admission: EM | Admit: 2019-06-06 | Discharge: 2019-06-06 | Disposition: A | Discharge status: Home / Self Care | Payer: Self-pay | Attending: Emergency Medicine | Admitting: Emergency Medicine

## 2019-06-06 ENCOUNTER — Encounter (HOSPITAL_COMMUNITY): Payer: Self-pay

## 2019-06-06 ENCOUNTER — Other Ambulatory Visit: Payer: Self-pay

## 2019-06-06 DIAGNOSIS — Z87891 Personal history of nicotine dependence: Secondary | ICD-10-CM | POA: Insufficient documentation

## 2019-06-06 DIAGNOSIS — Z113 Encounter for screening for infections with a predominantly sexual mode of transmission: Secondary | ICD-10-CM | POA: Insufficient documentation

## 2019-06-06 DIAGNOSIS — Z7689 Persons encountering health services in other specified circumstances: Secondary | ICD-10-CM

## 2019-06-06 DIAGNOSIS — R300 Dysuria: Secondary | ICD-10-CM | POA: Insufficient documentation

## 2019-06-06 LAB — URINALYSIS, ROUTINE W REFLEX MICROSCOPIC
Bilirubin Urine: NEGATIVE
Glucose, UA: NEGATIVE mg/dL
Hgb urine dipstick: NEGATIVE
Ketones, ur: NEGATIVE mg/dL
Nitrite: NEGATIVE
Protein, ur: NEGATIVE mg/dL
Specific Gravity, Urine: 1.018 (ref 1.005–1.030)
pH: 7 (ref 5.0–8.0)

## 2019-06-06 LAB — WET PREP, GENITAL
Sperm: NONE SEEN
Yeast Wet Prep HPF POC: NONE SEEN

## 2019-06-06 LAB — POC URINE PREG, ED: Preg Test, Ur: NEGATIVE

## 2019-06-06 MED ORDER — ONDANSETRON 4 MG PO TBDP
4.0000 mg | ORAL_TABLET | Freq: Once | ORAL | Status: AC
  Administered 2019-06-06: 10:00:00 4 mg via ORAL
  Filled 2019-06-06: qty 1

## 2019-06-06 MED ORDER — CEFTRIAXONE SODIUM 250 MG IJ SOLR
250.0000 mg | Freq: Once | INTRAMUSCULAR | Status: AC
  Administered 2019-06-06: 10:00:00 250 mg via INTRAMUSCULAR
  Filled 2019-06-06: qty 250

## 2019-06-06 MED ORDER — METRONIDAZOLE 500 MG PO TABS
2000.0000 mg | ORAL_TABLET | Freq: Once | ORAL | Status: AC
  Administered 2019-06-06: 2000 mg via ORAL
  Filled 2019-06-06: qty 4

## 2019-06-06 MED ORDER — AZITHROMYCIN 250 MG PO TABS
1000.0000 mg | ORAL_TABLET | Freq: Once | ORAL | Status: AC
  Administered 2019-06-06: 1000 mg via ORAL
  Filled 2019-06-06: qty 4

## 2019-06-06 NOTE — ED Provider Notes (Signed)
[facility] EMERGENCY DEPARTMENT Provider Note   CSN: [id number] Arrival date & time: 06/06/19  0840     History   Chief Complaint No chief complaint on file.   HPI Sandra Foster is a 28 y.o. female.     Patient is a 28 year old female with a history of gestational diabetes and chlamydia presenting today because her partner called her this morning to tell her he had been diagnosed with trichomonas.  Patient states she had chlamydia 4 years ago and was treated and has not had any problems since.  Patient states that she is sexually active with only one partner but does not use protection regularly.  She has noted for the last 1 week she has had some mild burning at the end of urination which she states is improving.  Menses ended 2 days ago and she denies any notable vaginal discharge, itching or burning.  No abdominal pain, fever, nausea or vomiting.  The history is provided by the patient.    Past Medical History:  Diagnosis Date  . Chlamydia   . Fetal demise    hyperglycemic hyperosmolar nonketonic syndrome  . Gestational diabetes   . Hernia   . Pregnancy induced hypertension   . Pyelonephritis     Patient Active Problem List   Diagnosis Date Noted  . Pregnancy 09/13/2015  . NVD (normal vaginal delivery) 09/13/2015  . Normal labor and delivery 09/12/2015  . Diabetes in undelivered pregnancy 08/30/2015  . 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 [redacted] weeks gestation of pregnancy   . Prior pregnancy with fetal demise and current pregnancy in second trimester   . Encounter for fetal anatomic survey     Past Surgical History:  Procedure Laterality Date  . HERNIA REPAIR       OB History    Gravida  3   Para  2   Term  2   Preterm      AB  1   Living  1     SAB  0   TAB  1   Ectopic      Multiple  0   Live Births  1            Home Medications    Prior to Admission medications   Medication Sig Start Date End  Date Taking? Authorizing Provider  benzonatate (TESSALON) 100 MG capsule Take 2 capsules (200 mg total) by mouth 2 (two) times daily as needed for cough. 12/13/16   Nona Dell, PA-C  oxymetazoline (AFRIN NASAL SPRAY) 0.05 % nasal spray Place 1 spray into both nostrils 2 (two) times daily. Spray once into each nostril twice daily for up to the next 3 days. Do not use for more than 3 days to prevent rebound rhinorrhea. 12/13/16   Nona Dell, PA-C    Family History Family History  Problem Relation Age of Onset  . Hypertension Maternal Grandmother     Social History Social History   Tobacco Use  . Smoking status: Former Smoker    Packs/day: 0.25  . Smokeless tobacco: Former Systems developer    Quit date: 06/10/2011  Substance Use Topics  . Alcohol use: No  . Drug use: No     Allergies   Patient has no known allergies.   Review of Systems Review of Systems  All other systems reviewed and are negative.    Physical Exam Updated Vital Signs BP 123/71 (BP Location: Right Arm)  Pulse 79   Temp 98.6 F (37 C) (Oral)   Resp 20   LMP 05/30/2019   SpO2 100%   Physical Exam Vitals signs and nursing note reviewed.  Constitutional:      General: She is not in acute distress.    Appearance: She is well-developed.  HENT:     Head: Normocephalic and atraumatic.  Eyes:     Conjunctiva/sclera: Conjunctivae normal.     Pupils: Pupils are equal, round, and reactive to light.  Neck:     Musculoskeletal: Normal range of motion and neck supple.  Cardiovascular:     Rate and Rhythm: Normal rate and regular rhythm.     Heart sounds: No murmur.  Pulmonary:     Effort: Pulmonary effort is normal. No respiratory distress.     Breath sounds: Normal breath sounds. No wheezing or rales.  Abdominal:     General: There is no distension.     Palpations: Abdomen is soft.     Tenderness: There is no abdominal tenderness. There is no guarding or rebound.  Genitourinary:     Labia:        Right: No rash, tenderness or lesion.        Left: No rash, tenderness or lesion.      Vagina: Normal. No vaginal discharge or tenderness.     Cervix: Normal.     Uterus: Normal.   Musculoskeletal: Normal range of motion.        General: No tenderness.  Skin:    General: Skin is warm and dry.     Findings: No erythema or rash.  Neurological:     Mental Status: She is alert and oriented to person, place, and time.  Psychiatric:        Behavior: Behavior normal.      ED Treatments / Results  Labs (all labs ordered are listed, but only abnormal results are displayed) Labs Reviewed  WET PREP, GENITAL  URINALYSIS, ROUTINE W REFLEX MICROSCOPIC  HIV ANTIBODY (ROUTINE TESTING W REFLEX)  RPR  POC URINE PREG, ED  GC/CHLAMYDIA PROBE AMP ([place]) NOT AT [facility]    EKG None  Radiology No results found.  Procedures Procedures (including critical care time)  Medications Ordered in ED Medications  cefTRIAXone (ROCEPHIN) injection 250 mg (has no administration in time range)  azithromycin (ZITHROMAX) tablet 1,000 mg (has no administration in time range)  metroNIDAZOLE (FLAGYL) tablet 2,000 mg (has no administration in time range)  ondansetron (ZOFRAN-ODT) disintegrating tablet 4 mg (has no administration in time range)     Initial Impression / Assessment and Plan / ED Course  I have reviewed the triage vital signs and the nursing notes.  Pertinent labs & imaging results that were available during my care of the patient were reviewed by me and considered in my medical decision making (see chart for details).       Patient presenting today to be evaluated for sexually transmitted infections because her partner called her and said he tested positive for trichomonas.  She is sexually active with only one partner but does not use protection regularly.  She has had some mild dysuria for the last 1 week which is gradually improving.  Low suspicion for pyelonephritis.   Patient has no lesions or significant discharge on pelvic exam and no symptoms concerning for PID.  Patient will be covered with Flagyl, Rocephin and azithromycin.  GC and chlamydia sent.  HIV and RPR also pending.  Urine pregnancy test was negative  and patient was discharged home.  Final Clinical Impressions(s) / ED Diagnoses   Final diagnoses:  Encounter for assessment of STD exposure    ED Discharge Orders    None       Gwyneth SproutPlunkett, Jackob [place], MD 06/06/19 [id number]

## 2019-06-06 NOTE — Discharge Instructions (Addendum)
Avoid sex for the next 1week or until symptoms resolve.  Practice safe sex practices to avoid exposure in the future.

## 2019-06-06 NOTE — ED Triage Notes (Signed)
Patient reports that her partner informed her this am that he is positive for trich. Patient complains of dysuria. Denies discharge

## 2019-06-07 LAB — HIV ANTIBODY (ROUTINE TESTING W REFLEX): HIV Screen 4th Generation wRfx: NONREACTIVE

## 2019-06-07 LAB — RPR: RPR Ser Ql: NONREACTIVE

## 2019-06-09 LAB — GC/CHLAMYDIA PROBE AMP (~~LOC~~) NOT AT ARMC
Chlamydia: NEGATIVE
Neisseria Gonorrhea: NEGATIVE

## 2019-10-13 ENCOUNTER — Ambulatory Visit (HOSPITAL_COMMUNITY)
Admission: EM | Admit: 2019-10-13 | Discharge: 2019-10-13 | Disposition: A | Discharge status: Home / Self Care | Payer: Self-pay | Attending: Family Medicine | Admitting: Family Medicine

## 2019-10-13 ENCOUNTER — Encounter (HOSPITAL_COMMUNITY): Payer: Self-pay | Admitting: Family Medicine

## 2019-10-13 ENCOUNTER — Other Ambulatory Visit: Payer: Self-pay

## 2019-10-13 DIAGNOSIS — L0291 Cutaneous abscess, unspecified: Secondary | ICD-10-CM

## 2019-10-13 MED ORDER — CEPHALEXIN 500 MG PO CAPS: 500.0000 mg | ORAL_CAPSULE | Freq: Four times a day (QID) | ORAL | 0 refills | Status: DC

## 2019-10-13 NOTE — ED Triage Notes (Signed)
Pt present boil on her right butt cheek with some drainage. The boil appeared 3 days ago.

## 2019-10-13 NOTE — Discharge Instructions (Signed)
Treating you for infection Keep doing the warm compresses.  Follow up as needed for continued or worsening symptoms

## 2019-10-13 NOTE — ED Provider Notes (Signed)
[facility]    CSN: [id number] Arrival date & time: 10/13/19  6195      History   Chief Complaint Chief Complaint  Patient presents with  . Recurrent Skin Infections    Right butt  cheek     HPI Sandra Foster is a 28 y.o. female.   Patient is a 28 year old female presents today with abscess to right buttocks area.  This is been constant worsening for 3 days.  Attempted to open the area up last night and draining with minimal drainage.  Reporting worsening swelling and pain this morning.  Denies any history of same.  Denies any history of MRSA.  No fever, chills, nausea or vomiting.  She has been doing warm compresses.  ROS per HPI      Past Medical History:  Diagnosis Date  . Chlamydia   . Fetal demise    hyperglycemic hyperosmolar nonketonic syndrome  . Gestational diabetes   . Hernia   . Pregnancy induced hypertension   . Pyelonephritis     Patient Active Problem List   Diagnosis Date Noted  . Pregnancy 09/13/2015  . NVD (normal vaginal delivery) 09/13/2015  . Normal labor and delivery 09/12/2015  . Diabetes in undelivered pregnancy 08/30/2015  . 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 [redacted] weeks gestation of pregnancy   . Prior pregnancy with fetal demise and current pregnancy in second trimester   . Encounter for fetal anatomic survey     Past Surgical History:  Procedure Laterality Date  . HERNIA REPAIR      OB History    Gravida  3   Para  2   Term  2   Preterm      AB  1   Living  1     SAB  0   TAB  1   Ectopic      Multiple  0   Live Births  1            Home Medications    Prior to Admission medications   Medication Sig Start Date End Date Taking? Authorizing Provider  benzonatate (TESSALON) 100 MG capsule Take 2 capsules (200 mg total) by mouth 2 (two) times daily as needed for cough. 12/13/16   Nona Dell, PA-C  cephALEXin (KEFLEX) 500 MG capsule Take 1 capsule (500 mg total) by  mouth 4 (four) times daily. 10/13/19   Loura Halt A, NP  oxymetazoline (AFRIN NASAL SPRAY) 0.05 % nasal spray Place 1 spray into both nostrils 2 (two) times daily. Spray once into each nostril twice daily for up to the next 3 days. Do not use for more than 3 days to prevent rebound rhinorrhea. 12/13/16   Nona Dell, PA-C    Family History Family History  Problem Relation Age of Onset  . Hypertension Maternal Grandmother     Social History Social History   Tobacco Use  . Smoking status: Former Smoker    Packs/day: 0.25  . Smokeless tobacco: Former Systems developer    Quit date: 06/10/2011  Substance Use Topics  . Alcohol use: No  . Drug use: No     Allergies   Patient has no known allergies.   Review of Systems Review of Systems   Physical Exam Triage Vital Signs ED Triage Vitals [10/13/19 1019]  Enc Vitals Group     BP (!) 109/57     Pulse Rate 92     Resp 16  Temp 98.6 F (37 C)     Temp Source Oral     SpO2 99 %     Weight      Height      Head Circumference      Peak Flow      Pain Score 10     Pain Loc      Pain Edu?      Excl. in GC?    No data found.  Updated Vital Signs BP (!) 109/57 (BP Location: Right Arm)   Pulse 92   Temp 98.6 F (37 C) (Oral)   Resp 16   LMP 09/11/2019   SpO2 99%   Visual Acuity Right Eye Distance:   Left Eye Distance:   Bilateral Distance:    Right Eye Near:   Left Eye Near:    Bilateral Near:     Physical Exam Vitals and nursing note reviewed.  Constitutional:      General: She is not in acute distress.    Appearance: Normal appearance. She is not ill-appearing, toxic-appearing or diaphoretic.  HENT:     Head: Normocephalic and atraumatic.     Nose: Nose normal.     Mouth/Throat:     Pharynx: Oropharynx is clear.  Eyes:     Conjunctiva/sclera: Conjunctivae normal.  Pulmonary:     Effort: Pulmonary effort is normal.  Abdominal:     Palpations: Abdomen is soft.     Tenderness: There is no  abdominal tenderness.  Musculoskeletal:        General: Normal range of motion.     Cervical back: Normal range of motion.  Skin:    General: Skin is warm and dry.     Findings: No rash.     Comments: Approximate 8 cm area of induration to right buttocks with central opening and drainage.  No fluctuance   Neurological:     Mental Status: She is alert.  Psychiatric:        Mood and Affect: Mood normal.      UC Treatments / Results  Labs (all labs ordered are listed, but only abnormal results are displayed) Labs Reviewed - No data to display  EKG   Radiology No results found.  Procedures Procedures (including critical care time)  Medications Ordered in UC Medications - No data to display  Initial Impression / Assessment and Plan / UC Course  I have reviewed the triage vital signs and the nursing notes.  Pertinent labs & imaging results that were available during my care of the patient were reviewed by me and considered in my medical decision making (see chart for details).     Abscess with cellulitis-treating with Keflex 4 times a day for 7 days.  We will have her continue the warm compresses. Recommend follow-up if no improvement or worsening symptoms the next 24 to 48 hours. Final Clinical Impressions(s) / UC Diagnoses   Final diagnoses:  Abscess     Discharge Instructions     Treating you for infection Keep doing the warm compresses.  Follow up as needed for continued or worsening symptoms     ED Prescriptions    Medication Sig Dispense Auth. Provider   cephALEXin (KEFLEX) 500 MG capsule Take 1 capsule (500 mg total) by mouth 4 (four) times daily. 28 capsule Dorette Hartel A, NP     PDMP not reviewed this encounter.   Janace Aris, NP 10/13/19 1035

## 2020-01-29 ENCOUNTER — Emergency Department (HOSPITAL_COMMUNITY)
Admission: EM | Admit: 2020-01-29 | Discharge: 2020-01-29 | Disposition: A | Discharge status: Home / Self Care | Payer: Self-pay | Attending: Emergency Medicine | Admitting: Emergency Medicine

## 2020-01-29 ENCOUNTER — Other Ambulatory Visit: Payer: Self-pay

## 2020-01-29 ENCOUNTER — Encounter (HOSPITAL_COMMUNITY): Payer: Self-pay | Admitting: Emergency Medicine

## 2020-01-29 DIAGNOSIS — Z202 Contact with and (suspected) exposure to infections with a predominantly sexual mode of transmission: Secondary | ICD-10-CM | POA: Insufficient documentation

## 2020-01-29 DIAGNOSIS — Z87891 Personal history of nicotine dependence: Secondary | ICD-10-CM | POA: Insufficient documentation

## 2020-01-29 LAB — URINALYSIS, ROUTINE W REFLEX MICROSCOPIC
Bilirubin Urine: NEGATIVE
Glucose, UA: NEGATIVE mg/dL
Ketones, ur: NEGATIVE mg/dL
Nitrite: NEGATIVE
Protein, ur: NEGATIVE mg/dL
Specific Gravity, Urine: 1.021 (ref 1.005–1.030)
pH: 6 (ref 5.0–8.0)

## 2020-01-29 MED ORDER — METRONIDAZOLE 500 MG PO TABS: 2000.0000 mg | ORAL_TABLET | Freq: Once | ORAL | Status: DC

## 2020-01-29 NOTE — ED Provider Notes (Signed)
[facility] EMERGENCY DEPARTMENT Provider Note   CSN: [id number] Arrival date & time: 01/29/20  2107     History Chief Complaint  Patient presents with  . Exposure to STD    Sandra Foster is a 29 y.o. female.  HPI Patient is a 29 year old female with a PMH of pyelonephritis and chlamydia presenting to the ED today due to STD concern.  Patient reports that she was notified today by her partner that he tested positive for herpes and trichomonas.  He is her only sexual partner and they do not use barrier contraception.  She denies any vaginal discharge/burning/pain or any genital lesions.  She denies fever, chills, nausea, vomiting, diarrhea, abdominal pain.    Past Medical History:  Diagnosis Date  . Chlamydia   . Fetal demise    hyperglycemic hyperosmolar nonketonic syndrome  . Gestational diabetes   . Hernia   . Pregnancy induced hypertension   . Pyelonephritis     Patient Active Problem List   Diagnosis Date Noted  . Pregnancy 09/13/2015  . NVD (normal vaginal delivery) 09/13/2015  . Normal labor and delivery 09/12/2015  . Diabetes in undelivered pregnancy 08/30/2015  . 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 [redacted] weeks gestation of pregnancy   . Prior pregnancy with fetal demise and current pregnancy in second trimester   . Encounter for fetal anatomic survey     Past Surgical History:  Procedure Laterality Date  . HERNIA REPAIR       OB History    Gravida  3   Para  2   Term  2   Preterm      AB  1   Living  1     SAB  0   TAB  1   Ectopic      Multiple  0   Live Births  1           Family History  Problem Relation Age of Onset  . Hypertension Maternal Grandmother     Social History   Tobacco Use  . Smoking status: Former Smoker    Packs/day: 0.25  . Smokeless tobacco: Former Neurosurgeon    Quit date: 06/10/2011  Substance Use Topics  . Alcohol use: No  . Drug use: No    Home Medications Prior to  Admission medications   Medication Sig Start Date End Date Taking? Authorizing Provider  etonogestrel (NEXPLANON) 68 MG IMPL implant 1 each by Subdermal route once.   Yes [provider]  benzonatate (TESSALON) 100 MG capsule Take 2 capsules (200 mg total) by mouth 2 (two) times daily as needed for cough. Patient not taking: Reported on 01/29/2020 12/13/16   Barrett Henle, PA-C  oxymetazoline [facility] NASAL SPRAY) 0.05 % nasal spray Place 1 spray into both nostrils 2 (two) times daily. Spray once into each nostril twice daily for up to the next 3 days. Do not use for more than 3 days to prevent rebound rhinorrhea. Patient not taking: Reported on 01/29/2020 12/13/16   Barrett Henle, PA-C    Allergies    Patient has no known allergies.  Review of Systems   Review of Systems  Constitutional: Negative for chills and fever.  HENT: Negative for rhinorrhea and sore throat.   Eyes: Negative for pain and visual disturbance.  Respiratory: Negative for cough and shortness of breath.   Cardiovascular: Negative for chest pain and palpitations.  Gastrointestinal: Negative for abdominal pain, diarrhea, nausea and  vomiting.  Genitourinary: Negative for dysuria, hematuria, pelvic pain, vaginal bleeding, vaginal discharge and vaginal pain.  Musculoskeletal: Negative for arthralgias and back pain.  Skin: Negative for color change and rash.  Neurological: Negative for light-headedness and headaches.  Psychiatric/Behavioral: Negative for agitation.  All other systems reviewed and are negative.   Physical Exam Updated Vital Signs BP (!) 118/58 (BP Location: Left Arm)   Pulse 76   Temp 98 F (36.7 C) (Oral)   Resp 14   Ht 5\' 6"  (1.676 m)   Wt 68 kg   LMP 01/25/2020   SpO2 98%   BMI 24.20 kg/m   Physical Exam Vitals and nursing note reviewed.  Constitutional:      General: She is not in acute distress.    Appearance: Normal appearance. She is well-developed. She is not  ill-appearing.  HENT:     Head: Normocephalic and atraumatic.     Right Ear: External ear normal.     Left Ear: External ear normal.     Nose: Nose normal. No congestion or rhinorrhea.     Mouth/Throat:     Mouth: Mucous membranes are moist.     Pharynx: Oropharynx is clear.  Eyes:     Extraocular Movements: Extraocular movements intact.     Pupils: Pupils are equal, round, and reactive to light.  Cardiovascular:     Rate and Rhythm: Normal rate and regular rhythm.     Pulses: Normal pulses.     Heart sounds: Normal heart sounds.  Pulmonary:     Effort: Pulmonary effort is normal. No respiratory distress.     Breath sounds: Normal breath sounds.  Abdominal:     General: There is no distension.     Palpations: Abdomen is soft.     Tenderness: There is no abdominal tenderness.  Musculoskeletal:        General: Normal range of motion.     Cervical back: Normal range of motion and neck supple.  Skin:    General: Skin is warm and dry.     Capillary Refill: Capillary refill takes less than 2 seconds.  Neurological:     General: No focal deficit present.     Mental Status: She is alert and oriented to person, place, and time. Mental status is at baseline.  Psychiatric:        Mood and Affect: Mood normal.     ED Results / Procedures / Treatments   Labs (all labs ordered are listed, but only abnormal results are displayed) Labs Reviewed  URINALYSIS, ROUTINE W REFLEX MICROSCOPIC - Abnormal; Notable for the following components:      Result Value   APPearance CLOUDY (*)    Hgb urine dipstick MODERATE (*)    Leukocytes,Ua MODERATE (*)    Bacteria, UA RARE (*)    All other components within normal limits    EKG None  Radiology No results found.  Procedures Procedures (including critical care time)  Medications Ordered in ED Medications - No data to display  ED Course  I have reviewed the triage vital signs and the nursing notes.  Pertinent labs & imaging results  that were available during my care of the patient were reviewed by me and considered in my medical decision making (see chart for details).    MDM Rules/Calculators/A&P                     Patient is a 29 year old female with a PMH of pyelonephritis and chlamydia  presenting to the ED today due to STD concern.  Physical exam unremarkable.  Vital signs stable.  Afebrile.  On arrival, patient appears generally well and is displaying no signs of acute distress.  Patient is concerned that she may have contracted herpes and trichomonas from her boyfriend who tested positive for these STI's today.  Patient denies any symptoms including genital lesions, pain or vaginal discharge.  Urinalysis collected in triage which showed moderate hemoglobin and rare bacteria.  Patient deferred pelvic exam.  As she is asymptomatic of herpes, will not be testing her for herpes in the ED today.  Pt to self swab for GC/chlamydia. Will treat pt empirically for trichomonas with Flagyl given her recent exposure.  Encouraged patient to [facility]-up at the [facility] department for further STD testing. Symptoms not concerning for PID. No further work-up or intervention required while in the ED.  Strongly discouraged patient from drinking alcohol after receiving Flagyl.  Patient stable for discharge at this time.  Provided strict return precautions.  Encouraged her to follow-up at the health department as needed for further STD testing.  Pt assessed and evaluated with Dr. Madilyn Hook.  Delray Alt, MD  Final Clinical Impression(s) / ED Diagnoses Final diagnoses:  STD exposure    Rx / DC Orders ED Discharge Orders    None       Delray Alt, MD 01/29/20 2321    Tilden Fossa, MD 01/31/20 1139

## 2020-01-29 NOTE — ED Notes (Signed)
Pt refused self swab and medication. Pt also refused final vitals and stated she had a family emergency requiring her to leave immediatly. Pt given AVS but walked away stating she will be back in the morning.

## 2020-01-29 NOTE — ED Triage Notes (Signed)
  Patient comes in with STD exposure.  Patient states she has been dating a guy for about a year and was told today he tested positive for genital herpes.  States the last sexual encounter was 3/24 and unprotected.  Patient states she has no symptoms but wanted to get checked out and is concerned.  No pain.

## 2020-03-13 ENCOUNTER — Other Ambulatory Visit: Payer: Self-pay

## 2020-03-13 ENCOUNTER — Encounter (HOSPITAL_COMMUNITY): Payer: Self-pay | Admitting: *Deleted

## 2020-03-13 ENCOUNTER — Emergency Department (HOSPITAL_COMMUNITY): Payer: Self-pay

## 2020-03-13 ENCOUNTER — Emergency Department (HOSPITAL_COMMUNITY)
Admission: EM | Admit: 2020-03-13 | Discharge: 2020-03-13 | Disposition: A | Discharge status: Home / Self Care | Payer: Self-pay | Attending: Emergency Medicine | Admitting: Emergency Medicine

## 2020-03-13 DIAGNOSIS — S0101XA Laceration without foreign body of scalp, initial encounter: Secondary | ICD-10-CM

## 2020-03-13 DIAGNOSIS — S0181XA Laceration without foreign body of other part of head, initial encounter: Secondary | ICD-10-CM | POA: Insufficient documentation

## 2020-03-13 DIAGNOSIS — Z79899 Other long term (current) drug therapy: Secondary | ICD-10-CM | POA: Insufficient documentation

## 2020-03-13 DIAGNOSIS — Y9389 Activity, other specified: Secondary | ICD-10-CM | POA: Insufficient documentation

## 2020-03-13 DIAGNOSIS — S0990XA Unspecified injury of head, initial encounter: Secondary | ICD-10-CM | POA: Insufficient documentation

## 2020-03-13 DIAGNOSIS — S0083XA Contusion of other part of head, initial encounter: Secondary | ICD-10-CM

## 2020-03-13 DIAGNOSIS — Y999 Unspecified external cause status: Secondary | ICD-10-CM | POA: Insufficient documentation

## 2020-03-13 DIAGNOSIS — Z87891 Personal history of nicotine dependence: Secondary | ICD-10-CM | POA: Insufficient documentation

## 2020-03-13 DIAGNOSIS — Z23 Encounter for immunization: Secondary | ICD-10-CM | POA: Insufficient documentation

## 2020-03-13 DIAGNOSIS — Y9289 Other specified places as the place of occurrence of the external cause: Secondary | ICD-10-CM | POA: Insufficient documentation

## 2020-03-13 MED ORDER — LIDOCAINE-EPINEPHRINE (PF) 2 %-1:200000 IJ SOLN
10.0000 mL | Freq: Once | INTRAMUSCULAR | Status: DC
  Filled 2020-03-13: qty 20

## 2020-03-13 MED ORDER — TETANUS-DIPHTH-ACELL PERTUSSIS 5-2.5-18.5 LF-MCG/0.5 IM SUSP
0.5000 mL | Freq: Once | INTRAMUSCULAR | Status: AC
  Administered 2020-03-13: 0.5 mL via INTRAMUSCULAR
  Filled 2020-03-13: qty 0.5

## 2020-03-13 MED ORDER — OXYCODONE-ACETAMINOPHEN 5-325 MG PO TABS
1.0000 | ORAL_TABLET | Freq: Once | ORAL | Status: AC
  Administered 2020-03-13: 1 via ORAL
  Filled 2020-03-13: qty 1

## 2020-03-13 NOTE — ED Triage Notes (Signed)
Pt on her cell cursing
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
## 2020-03-17 ENCOUNTER — Emergency Department (HOSPITAL_COMMUNITY)
Admission: EM | Admit: 2020-03-17 | Discharge: 2020-03-17 | Disposition: A | Discharge status: Home / Self Care | Payer: Self-pay | Attending: Emergency Medicine | Admitting: Emergency Medicine

## 2020-03-17 ENCOUNTER — Other Ambulatory Visit: Payer: Self-pay

## 2020-03-17 DIAGNOSIS — G44319 Acute post-traumatic headache, not intractable: Secondary | ICD-10-CM | POA: Insufficient documentation

## 2020-03-17 DIAGNOSIS — Z87891 Personal history of nicotine dependence: Secondary | ICD-10-CM | POA: Insufficient documentation

## 2020-03-17 DIAGNOSIS — S0990XD Unspecified injury of head, subsequent encounter: Secondary | ICD-10-CM | POA: Insufficient documentation

## 2020-03-17 MED ORDER — KETOROLAC TROMETHAMINE 30 MG/ML IJ SOLN
30.0000 mg | Freq: Once | INTRAMUSCULAR | Status: AC
  Administered 2020-03-17: 30 mg via INTRAMUSCULAR
  Filled 2020-03-17: qty 1

## 2020-03-17 MED ORDER — ACETAMINOPHEN 500 MG PO TABS
1000.0000 mg | ORAL_TABLET | Freq: Once | ORAL | Status: AC
  Administered 2020-03-17: 1000 mg via ORAL
  Filled 2020-03-17: qty 2

## 2020-03-17 NOTE — Discharge Instructions (Signed)
I have referred you outpatient to post concussion clinic.  Return to the emergency department for any worsening symptoms.
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
## 2020-03-21 ENCOUNTER — Telehealth: Payer: Self-pay | Admitting: Physical Therapy

## 2020-03-21 ENCOUNTER — Encounter: Payer: Self-pay | Admitting: Physical Therapy

## 2020-03-21 NOTE — Telephone Encounter (Signed)
Called pt but unable to LM due to VM being full.  Will attempt to send MyChart message.

## 2020-03-25 ENCOUNTER — Ambulatory Visit (HOSPITAL_COMMUNITY)
Admission: EM | Admit: 2020-03-25 | Discharge: 2020-03-25 | Disposition: A | Discharge status: Home / Self Care | Payer: Self-pay

## 2020-03-25 ENCOUNTER — Other Ambulatory Visit: Payer: Self-pay

## 2020-03-25 ENCOUNTER — Ambulatory Visit: Payer: Self-pay | Admitting: Family Medicine

## 2020-03-25 VITALS — BP 102/70 | Ht 66.0 in | Wt 160.2 lb

## 2020-03-25 DIAGNOSIS — S060X9A Concussion with loss of consciousness of unspecified duration, initial encounter: Secondary | ICD-10-CM

## 2020-03-25 DIAGNOSIS — S63649A Sprain of metacarpophalangeal joint of unspecified thumb, initial encounter: Secondary | ICD-10-CM

## 2020-03-25 MED ORDER — AMITRIPTYLINE HCL 25 MG PO TABS: 25.0000 mg | ORAL_TABLET | Freq: Every evening | ORAL | 1 refills | Status: DC | PRN
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
## 2020-03-25 NOTE — Patient Instructions (Signed)
Thank you for coming in today. Take amitriptyline at bedtime for headache prevention.  It will make you sleepy.  Could switch it if needed.   Use the splint on the hand.  Also ok to use over the counter Voltaren gel on the thumb joint up to 4x daily.   Try using a heating pad on the neck and a TENS unit for muscle spasm.   TENS UNIT: This is helpful for muscle pain and spasm.   Search and Purchase a TENS 7000 2nd edition at  www.tenspros.com or www.Amazon.com It should be less than $30.     TENS unit instructions: Do not shower or bathe with the unit on Turn the unit off before removing electrodes or batteries If the electrodes lose stickiness add a drop of water to the electrodes after they are disconnected from the unit and place on plastic sheet. If you continued to have difficulty, call the TENS unit company to purchase more electrodes. Do not apply lotion on the skin area prior to use. Make sure the skin is clean and dry as this will help prolong the life of the electrodes. After use, always check skin for unusual red areas, rash or other skin difficulties. If there are any skin problems, does not apply electrodes to the same area. Never remove the electrodes from the unit by pulling the wires. Do not use the TENS unit or electrodes other than as directed. Do not change electrode placement without consultating your therapist or physician. Keep 2 fingers with between each electrode. Wear time ratio is 2:1, on to off times.    For example on for 30 minutes off for 15 minutes and then on for 30 minutes off for 15 minutes    Skier's Thumb  Skier's thumb is a stretched or torn ligament in the thumb from a sudden injury (acute injury). It is sometimes called gamekeeper's thumb if it developed gradually (chronic injury) from repeated overstretching of the ligaments. Ligaments are strong bands of tissue that connect bones. The ligament that is injured (ulnar collateral ligament)  connects the bones that make up the joint at the base of the thumb. A tear can be either partial or complete. The severity of the injury depends on how much of the ligament was damaged or torn. If it is not treated properly, this injury can lead to arthritis. What are the causes? This condition occurs when the thumb is forcefully moved past its normal range of motion toward the wrist. It may be caused by:  Falling onto an outstretched hand. This often happens to skiers who fall with ski poles in their hands.  Repeated movements that use the thumb, like catching a ball or other object. What increases the risk? You are more likely to develop this condition if:  You had a previous thumb injury.  You play contact sports or sports that involve catching balls, such as baseball, basketball, or football.  You do activities that increase the chance that the thumb will be pulled away from the rest of the hand.  You have poor hand strength and flexibility.  You do not warm up properly before activities. What are the signs or symptoms? Symptoms of this condition include:  Pain or tenderness.  Swelling.  Trouble grasping or pinching with the injured thumb.  Bruising or redness. If the injury is severe, a lump (mass) may be felt under the skin in the injured area. How is this diagnosed? This condition may be diagnosed based on:  Your symptoms and medical history.  A physical exam.  Imaging tests, such as X-rays, an ultrasound, or an MRI. How is this treated? Treatment for this condition depends on the severity of your injury.  If the ligament is overstretched or partially torn, treatment usually involves keeping your thumb in a fixed position (immobilization) for a period of time. Your health care provider will apply a brace, cast, or splint to keep your thumb from moving until it heals.  If the ligament is fully torn, you may need surgery to reconnect the ligament to the bone. After  surgery, you will need to wear a cast or splint on your thumb. Your health care provider may also suggest exercises or physical therapy to strengthen your thumb. Follow these instructions at home: If you have a cast:  Do not stick anything inside the cast to scratch your skin. Doing that increases your risk of infection.  Check the skin around the cast every day. Tell your health care provider about any concerns.  You may put lotion on dry skin around the edges of the cast. Do not put lotion on the skin underneath the cast.  Keep the cast clean and dry. If you have a splint or brace:   Wear the splint or brace as told by your health care provider. Remove it only as told by your health care provider.  Loosen the splint or brace if your fingers tingle, become numb, or turn cold and blue.  Keep the splint or brace clean and dry. Bathing  Do not take baths, swim, or use a hot tub until your health care provider approves. Ask your health care provider if you may take showers. You may only be allowed to take sponge baths.  If your cast, splint, or brace is not waterproof: ? Do not let it get wet. ? Cover it with a watertight covering when you take a bath or shower. Managing pain, stiffness, and swelling   If directed, put ice on the injured area: ? If you have a removable splint or brace, remove it as told by your health care provider. ? Put ice in a plastic bag. ? Place a towel between your skin and the bag or between your cast and the bag. ? Leave the ice on for 20 minutes, 2-3 times a day.  Move your fingers often to avoid stiffness and to lessen swelling.  Raise (elevate) the injured area above the level of your heart while you are sitting or lying down. Driving  Do not drive or use heavy machinery while taking prescription pain medicine.  Ask your health care provider when it is safe to drive if you have a cast, splint, or brace on your hand. Activity  Return to your  normal activities as told by your health care provider. Ask your health care provider what activities are safe for you.  Do exercises as told by your health care provider or physical therapist. General instructions  Do not put pressure on any part of the cast or splint until it is fully hardened. This may take several hours.  Do not wear rings on your injured thumb.  Take over-the-counter and prescription medicines only as told by your health care provider.  To prevent or treat constipation while you are taking prescription pain medicine, your health care provider may recommend that you: ? Drink enough fluid to keep your urine pale yellow. ? Eat foods that are high in fiber, such as beans, whole grains, and fresh  fruits and vegetables. ? Limit foods that are high in fat and processed sugars, such as fried or sweet foods. ? Take an over-the-counter or prescription medicine for constipation.  Keep all follow-up visits as told by your health care provider. This is important. Contact a health care provider if:  Your pain is not controlled with medicine.  Your bruising or swelling gets worse.  Your cast or splint is damaged.  Your thumb is numb and feels colder to the touch than normal. Get help right away if:  You have severe pain.  Your thumb is pale or blue. Summary  Skier's thumb is a stretched or torn ligament in the thumb.  This injury can happen suddenly (acute) or may develop gradually (chronic).  Treatment usually involves wearing a cast, splint, or brace on your thumb. Surgery may be needed if the ligament is fully torn. This information is not intended to replace advice given to you by your health care provider. Make sure you discuss any questions you have with your health care provider. Document Revised: 02/05/2018 Document Reviewed: 02/05/2018 Elsevier Patient Education  [employer].

## 2020-04-11 ENCOUNTER — Ambulatory Visit (INDEPENDENT_AMBULATORY_CARE_PROVIDER_SITE_OTHER): Payer: Self-pay | Admitting: Family Medicine

## 2020-04-11 DIAGNOSIS — Z0289 Encounter for other administrative examinations: Secondary | ICD-10-CM

## 2020-04-11 NOTE — Progress Notes (Signed)
No show

## 2020-06-18 ENCOUNTER — Encounter (HOSPITAL_COMMUNITY): Payer: Self-pay

## 2020-06-18 ENCOUNTER — Other Ambulatory Visit: Payer: Self-pay

## 2020-06-18 ENCOUNTER — Ambulatory Visit (HOSPITAL_COMMUNITY)
Admission: EM | Admit: 2020-06-18 | Discharge: 2020-06-18 | Disposition: A | Discharge status: Home / Self Care | Payer: Self-pay | Attending: Emergency Medicine | Admitting: Emergency Medicine

## 2020-06-18 DIAGNOSIS — L739 Follicular disorder, unspecified: Secondary | ICD-10-CM | POA: Insufficient documentation

## 2020-06-18 DIAGNOSIS — R21 Rash and other nonspecific skin eruption: Secondary | ICD-10-CM | POA: Insufficient documentation

## 2020-06-18 LAB — HIV ANTIBODY (ROUTINE TESTING W REFLEX): HIV Screen 4th Generation wRfx: NONREACTIVE

## 2020-06-18 MED ORDER — DOXYCYCLINE HYCLATE 100 MG PO CAPS: 100.0000 mg | ORAL_CAPSULE | Freq: Two times a day (BID) | ORAL | 0 refills | Status: AC

## 2020-06-18 NOTE — Discharge Instructions (Signed)
Doxycycline twice daily x 10 days STD screening pending, we will call with any abnormal results

## 2020-06-18 NOTE — ED Triage Notes (Signed)
Pt c/o rash to pubic hair area that is painful and itchy for past two days. Pt reports rash is red with yellow tips. Denies vaginal discharge, n/v/d, fever, chills, dysuria sx.

## 2020-06-19 LAB — RPR: RPR Ser Ql: NONREACTIVE

## 2020-06-19 NOTE — ED Provider Notes (Signed)
[facility]    CSN: [id number] Arrival date & time: 06/18/20  1347      History   Chief Complaint Chief Complaint  Patient presents with  . Rash    HPI Sandra Foster is a 29 y.o. female presenting today for evaluation of a rash.  Patient reports over the past 2 days she has developed a painful itchy rash to her pubic area.  Reports initially pubic hairs were sensitive to touch, but has noticed bumps and redness develop in this area.  She denies any other pelvic symptoms of vaginal discharge or pelvic pain.  Denies fevers.  Denies history of similar.  Denies any known exposures to STDs.  HPI  Past Medical History:  Diagnosis Date  . Chlamydia   . Fetal demise    hyperglycemic hyperosmolar nonketonic syndrome  . Gestational diabetes   . Hernia   . Pregnancy induced hypertension   . Pyelonephritis     Patient Active Problem List   Diagnosis Date Noted  . Pregnancy 09/13/2015  . NVD (normal vaginal delivery) 09/13/2015  . Normal labor and delivery 09/12/2015  . Diabetes in undelivered pregnancy 08/30/2015  . 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 [redacted] weeks gestation of pregnancy   . Prior pregnancy with fetal demise and current pregnancy in second trimester   . Encounter for fetal anatomic survey     Past Surgical History:  Procedure Laterality Date  . HERNIA REPAIR      OB History    Gravida  3   Para  2   Term  2   Preterm      AB  1   Living  1     SAB  0   TAB  1   Ectopic      Multiple  0   Live Births  1            Home Medications    Prior to Admission medications   Medication Sig Start Date End Date Taking? Authorizing Provider  etonogestrel (NEXPLANON) 68 MG IMPL implant 1 each by Subdermal route once.   Yes [provider]  doxycycline (VIBRAMYCIN) 100 MG capsule Take 1 capsule (100 mg total) by mouth 2 (two) times daily for 10 days. 06/18/20 06/28/20  Temekia Caskey C, PA-C  amitriptyline  (ELAVIL) 25 MG tablet Take 1-2 tablets (25-50 mg total) by mouth at bedtime as needed (headache). 03/25/20 06/18/20  Rodolph Bong, MD    Family History Family History  Problem Relation Age of Onset  . Hypertension Maternal Grandmother     Social History Social History   Tobacco Use  . Smoking status: Former Smoker    Packs/day: 0.25  . Smokeless tobacco: Former Neurosurgeon    Quit date: 06/10/2011  Substance Use Topics  . Alcohol use: No  . Drug use: No     Allergies   Patient has no known allergies.   Review of Systems Review of Systems  Constitutional: Negative for fatigue and fever.  HENT: Negative for mouth sores.   Eyes: Negative for visual disturbance.  Respiratory: Negative for shortness of breath.   Cardiovascular: Negative for chest pain.  Gastrointestinal: Negative for abdominal pain, diarrhea, nausea and vomiting.  Genitourinary: Negative for dysuria, flank pain, genital sores, hematuria, menstrual problem, vaginal bleeding, vaginal discharge and vaginal pain.  Musculoskeletal: Negative for arthralgias, back pain and joint swelling.  Skin: Positive for color change and rash. Negative for wound.  Neurological: Negative  for dizziness, weakness, light-headedness and headaches.     Physical Exam Triage Vital Signs ED Triage Vitals  Enc Vitals Group     BP 06/18/20 1448 113/68     Pulse Rate 06/18/20 1448 96     Resp 06/18/20 1448 18     Temp 06/18/20 1448 98.6 F (37 C)     Temp Source 06/18/20 1448 Oral     SpO2 06/18/20 1448 98 %     Weight --      Height --      Head Circumference --      Peak Flow --      Pain Score 06/18/20 1446 8     Pain Loc --      Pain Edu? --      Excl. in GC? --    No data found.  Updated Vital Signs BP 113/68 (BP Location: Left Arm)   Pulse 96   Temp 98.6 F (37 C) (Oral)   Resp 18   LMP 05/04/2020   SpO2 98%   Visual Acuity Right Eye Distance:   Left Eye Distance:   Bilateral Distance:    Right Eye Near:   Left  Eye Near:    Bilateral Near:     Physical Exam Vitals and nursing note reviewed.  Constitutional:      Appearance: She is well-developed.     Comments: No acute distress  HENT:     Head: Normocephalic and atraumatic.     Nose: Nose normal.  Eyes:     Conjunctiva/sclera: Conjunctivae normal.  Cardiovascular:     Rate and Rhythm: Normal rate.  Pulmonary:     Effort: Pulmonary effort is normal. No respiratory distress.  Abdominal:     General: There is no distension.  Genitourinary:    Comments: Mons pubic area with many hyperpigmented/slightly erythematous papular bumps noted around hair shafts, mildly tender to touch, pustular drainage expressed from one lesion Musculoskeletal:        General: Normal range of motion.     Cervical back: Neck supple.  Skin:    General: Skin is warm and dry.  Neurological:     Mental Status: She is alert and oriented to person, place, and time.      UC Treatments / Results  Labs (all labs ordered are listed, but only abnormal results are displayed) Labs Reviewed  HSV CULTURE AND TYPING  HIV ANTIBODY (ROUTINE TESTING W REFLEX)  RPR  CERVICOVAGINAL ANCILLARY ONLY    EKG   Radiology No results found.  Procedures Procedures (including critical care time)  Medications Ordered in UC Medications - No data to display  Initial Impression / Assessment and Plan / UC Course  I have reviewed the triage vital signs and the nursing notes.  Pertinent labs & imaging results that were available during my care of the patient were reviewed by me and considered in my medical decision making (see chart for details).     Exam most consistent with folliculitis, seems less likely HSV, but will swab for reassurance.  Other STD screening pending including HIV and syphilis.  Initiated on doxycycline, recommended warm compresses and close monitoring.  Discussed strict return precautions. Patient verbalized understanding and is agreeable with  plan.  Final Clinical Impressions(s) / UC Diagnoses   Final diagnoses:  Rash and nonspecific skin eruption  Folliculitis     Discharge Instructions     Doxycycline twice daily x 10 days STD screening pending, we will call with any abnormal  results   ED Prescriptions    Medication Sig Dispense Auth. Provider   doxycycline (VIBRAMYCIN) 100 MG capsule Take 1 capsule (100 mg total) by mouth 2 (two) times daily for 10 days. 20 capsule Tracyann Duffell, [place] C, PA-C     PDMP not reviewed this encounter.   Lew Dawes, [place] 06/19/20 [phone]

## 2020-06-20 LAB — CERVICOVAGINAL ANCILLARY ONLY
Bacterial Vaginitis (gardnerella): POSITIVE — AB
Candida Glabrata: NEGATIVE
Candida Vaginitis: NEGATIVE
Chlamydia: NEGATIVE
Comment: NEGATIVE
Comment: NEGATIVE
Comment: NEGATIVE
Comment: NEGATIVE
Comment: NEGATIVE
Comment: NORMAL
Neisseria Gonorrhea: NEGATIVE
Trichomonas: NEGATIVE

## 2020-06-20 LAB — HSV CULTURE AND TYPING

## 2020-06-24 ENCOUNTER — Telehealth (HOSPITAL_COMMUNITY): Payer: Self-pay | Admitting: Emergency Medicine

## 2020-06-24 MED ORDER — METRONIDAZOLE 500 MG PO TABS: 500.0000 mg | ORAL_TABLET | Freq: Two times a day (BID) | ORAL | 0 refills | Status: DC

## 2020-06-24 NOTE — Progress Notes (Signed)
Patient called and made aware of positive BV.  Flagyl sent to pharmacy.  Patient also made aware of inability to get HSV culture result.  Informed her to return if rash returns within 24-48 hours of it beginning.  Patient verbalized understanding.

## 2020-07-13 ENCOUNTER — Other Ambulatory Visit: Payer: Self-pay

## 2020-09-06 ENCOUNTER — Ambulatory Visit (HOSPITAL_COMMUNITY)
Admission: EM | Admit: 2020-09-06 | Discharge: 2020-09-06 | Disposition: A | Discharge status: Home / Self Care | Payer: Self-pay | Attending: Urgent Care | Admitting: Urgent Care

## 2020-09-06 ENCOUNTER — Ambulatory Visit (INDEPENDENT_AMBULATORY_CARE_PROVIDER_SITE_OTHER): Payer: Self-pay

## 2020-09-06 ENCOUNTER — Encounter (HOSPITAL_COMMUNITY): Payer: Self-pay | Admitting: Emergency Medicine

## 2020-09-06 ENCOUNTER — Other Ambulatory Visit: Payer: Self-pay

## 2020-09-06 DIAGNOSIS — S61412A Laceration without foreign body of left hand, initial encounter: Secondary | ICD-10-CM

## 2020-09-06 DIAGNOSIS — M79641 Pain in right hand: Secondary | ICD-10-CM

## 2020-09-06 DIAGNOSIS — M79642 Pain in left hand: Secondary | ICD-10-CM

## 2020-09-06 DIAGNOSIS — S61411A Laceration without foreign body of right hand, initial encounter: Secondary | ICD-10-CM

## 2020-09-06 DIAGNOSIS — W228XXA Striking against or struck by other objects, initial encounter: Secondary | ICD-10-CM

## 2020-09-06 MED ORDER — LIDOCAINE HCL 2 % IJ SOLN
INTRAMUSCULAR | Status: AC
  Filled 2020-09-06: qty 20

## 2020-09-06 MED ORDER — ACETAMINOPHEN 325 MG PO TABS
ORAL_TABLET | ORAL | Status: AC
  Filled 2020-09-06: qty 2

## 2020-09-06 MED ORDER — LIDOCAINE-EPINEPHRINE 1 %-1:100000 IJ SOLN
INTRAMUSCULAR | Status: AC
  Filled 2020-09-06: qty 1

## 2020-09-06 MED ORDER — TETANUS-DIPHTH-ACELL PERTUSSIS 5-2.5-18.5 LF-MCG/0.5 IM SUSY
PREFILLED_SYRINGE | INTRAMUSCULAR | Status: AC
  Filled 2020-09-06: qty 0.5

## 2020-09-06 MED ORDER — NAPROXEN 500 MG PO TABS
500.0000 mg | ORAL_TABLET | Freq: Two times a day (BID) | ORAL | 0 refills | Status: DC
Start: 2020-09-06 — End: 2021-02-28

## 2020-09-06 MED ORDER — ACETAMINOPHEN 325 MG PO TABS
650.0000 mg | ORAL_TABLET | Freq: Once | ORAL | Status: AC
  Administered 2020-09-06: 650 mg via ORAL

## 2020-09-06 MED ORDER — TETANUS-DIPHTH-ACELL PERTUSSIS 5-2.5-18.5 LF-MCG/0.5 IM SUSY: 0.5000 mL | PREFILLED_SYRINGE | Freq: Once | INTRAMUSCULAR | Status: DC

## 2020-09-06 NOTE — Discharge Instructions (Signed)

## 2020-09-06 NOTE — ED Provider Notes (Signed)
[facility]   MRN: [id number] DOB: [birth year]  Subjective:   [name] is a 29 y.o. female presenting for suffering a bilateral hand laceration from punching a TV screen multiple times yesterday.  Patient states that she was aggravated by some family members.  States that she has a difficult time coping when they bring up her history of having had a stillborn birth about 8 years ago.  She is never talked to anybody about this and feels like she can easily get triggered anytime family or friends bring this up.  Reports loss of range of motion for her left hand due to pain in an open wound.  States that she has a bump and a bruise over the right side of her hand but can move it a lot easier.  She had her Tdap updated within the past year.  No current facility-administered medications for this encounter.  Current Outpatient Medications:    etonogestrel (NEXPLANON) 68 MG IMPL implant, 1 each by Subdermal route once., Disp: , Rfl:    metroNIDAZOLE (FLAGYL) 500 MG tablet, Take 1 tablet (500 mg total) by mouth 2 (two) times daily., Disp: 14 tablet, Rfl: 0   No Known Allergies  Past Medical History:  Diagnosis Date   Chlamydia    Fetal demise    hyperglycemic hyperosmolar nonketonic syndrome   Gestational diabetes    Hernia    Pregnancy induced hypertension    Pyelonephritis      Past Surgical History:  Procedure Laterality Date   HERNIA REPAIR      Family History  Problem Relation Age of Onset   Hypertension Maternal Grandmother     Social History   Tobacco Use   Smoking status: Former Smoker    Packs/day: 0.25   Smokeless tobacco: Former Neurosurgeon    Quit date: 06/10/2011  Substance Use Topics   Alcohol use: No   Drug use: No    ROS   Objective:   Vitals: BP 119/85 (BP Location: Right Arm)    Pulse 68    Temp 98.8 F (37.1 C) (Oral)    Resp 17    LMP 09/03/2020    SpO2 96%   Physical Exam Constitutional:      General: She is not  in acute distress.    Appearance: Normal appearance. She is well-developed. She is not ill-appearing, toxic-appearing or diaphoretic.  HENT:     Head: Normocephalic and atraumatic.     Nose: Nose normal.     Mouth/Throat:     Mouth: Mucous membranes are moist.     Pharynx: Oropharynx is clear.  Eyes:     General: No scleral icterus.       Right eye: No discharge.        Left eye: No discharge.     Extraocular Movements: Extraocular movements intact.     Conjunctiva/sclera: Conjunctivae normal.     Pupils: Pupils are equal, round, and reactive to light.  Cardiovascular:     Rate and Rhythm: Normal rate.  Pulmonary:     Effort: Pulmonary effort is normal.  Musculoskeletal:       Hands:  Skin:    General: Skin is warm and dry.  Neurological:     General: No focal deficit present.     Mental Status: She is alert and oriented to person, place, and time.  Psychiatric:        Mood and Affect: Mood normal.  Behavior: Behavior normal.        Thought Content: Thought content normal.        Judgment: Judgment normal.     DG Hand Complete Left  Result Date: 09/06/2020 CLINICAL DATA:  Left hand injury. EXAM: LEFT HAND - COMPLETE 3+ VIEW COMPARISON:  None. FINDINGS: There is no evidence of fracture or dislocation. There is no evidence of arthropathy or other focal bone abnormality. Soft tissues are unremarkable. IMPRESSION: Negative. Electronically Signed   By: Obie Dredge M.D.   On: 09/06/2020 12:05   DG Hand Complete Right  Result Date: 09/06/2020 CLINICAL DATA:  Right hand injury. EXAM: RIGHT HAND - COMPLETE 3+ VIEW COMPARISON:  Right hand x-rays dated Mar 13, 2020. FINDINGS: There is no evidence of acute fracture or dislocation. Small well corticated ossific density at the volar base of the third middle phalanx is unchanged since the prior study and likely due to old injury. There is no evidence of arthropathy or other focal bone abnormality. Soft tissues are unremarkable.  IMPRESSION: 1. No acute osseous abnormality. Electronically Signed   By: Obie Dredge M.D.   On: 09/06/2020 11:58   PROCEDURE NOTE: laceration repair Verbal consent obtained from patient.  Local anesthesia with 3cc Lidocaine 1% with epinephrine.  Wound explored for tendon, ligament damage. Wound scrubbed with soap and water and rinsed. Wound closed with #6 5-0 Prolene (simple interrupted and horizontal mattress) sutures.  Wound cleansed and dressed.   Assessment and Plan :   PDMP not reviewed this encounter.  1. Left hand pain   2. Right hand pain   3. Laceration of left hand without foreign body, initial encounter   4. Laceration of right hand without foreign body, initial encounter     X-rays negative for fracture.  Recommended conservative management for bilateral hand contusion from punching a TV.  Use naproxen for pain and inflammation.  Lacerations repaired successfully.  Wound care reviewed.  Tdap is already updated.  Follow-up in 10 days for suture removal. Counseled patient on potential for adverse effects with medications prescribed/recommended today, ER and return-to-clinic precautions discussed, patient verbalized understanding.    Wallis Bamberg, PA-C 09/06/20 1248

## 2020-09-06 NOTE — ED Triage Notes (Signed)
Pt presents with left hand laceration after punching tv last night due to "mental break down."

## 2021-02-28 ENCOUNTER — Ambulatory Visit (HOSPITAL_COMMUNITY)
Admission: EM | Admit: 2021-02-28 | Discharge: 2021-02-28 | Disposition: A | Discharge status: Home / Self Care | Payer: Medicaid Other | Attending: Student | Admitting: Student

## 2021-02-28 ENCOUNTER — Other Ambulatory Visit: Payer: Self-pay

## 2021-02-28 ENCOUNTER — Encounter (HOSPITAL_COMMUNITY): Payer: Self-pay

## 2021-02-28 DIAGNOSIS — N921 Excessive and frequent menstruation with irregular cycle: Secondary | ICD-10-CM | POA: Diagnosis present

## 2021-02-28 DIAGNOSIS — Z975 Presence of (intrauterine) contraceptive device: Secondary | ICD-10-CM

## 2021-02-28 DIAGNOSIS — G43709 Chronic migraine without aura, not intractable, without status migrainosus: Secondary | ICD-10-CM | POA: Insufficient documentation

## 2021-02-28 DIAGNOSIS — Z3202 Encounter for pregnancy test, result negative: Secondary | ICD-10-CM | POA: Insufficient documentation

## 2021-02-28 DIAGNOSIS — Z0189 Encounter for other specified special examinations: Secondary | ICD-10-CM

## 2021-02-28 DIAGNOSIS — Z789 Other specified health status: Secondary | ICD-10-CM | POA: Diagnosis present

## 2021-02-28 LAB — CBC
HCT: 44.4 % (ref 36.0–46.0)
Hemoglobin: 14.6 g/dL (ref 12.0–15.0)
MCH: 29.7 pg (ref 26.0–34.0)
MCHC: 32.9 g/dL (ref 30.0–36.0)
MCV: 90.2 fL (ref 80.0–100.0)
Platelets: 255 10*3/uL (ref 150–400)
RBC: 4.92 MIL/uL (ref 3.87–5.11)
RDW: 14.6 % (ref 11.5–15.5)
WBC: 9 10*3/uL (ref 4.0–10.5)
nRBC: 0 % (ref 0.0–0.2)

## 2021-02-28 LAB — POC URINE PREG, ED: Preg Test, Ur: NEGATIVE

## 2021-02-28 MED ORDER — MEGESTROL ACETATE 40 MG PO TABS
40.0000 mg | ORAL_TABLET | Freq: Two times a day (BID) | ORAL | 0 refills | Status: AC
Start: 2021-02-28 — End: 2021-03-14

## 2021-02-28 NOTE — Discharge Instructions (Addendum)
-  Megace twice daily for up to 14 days. This will help stop your bleeding.  -We're checking your iron levels today. We'll call you if this is abnormal and can send an iron supplement if necessary. -Follow-up with the gynecologist who placed your nexplanon, or new gynecologoist- information below. Try to make this appointment in the next week or so. -Seek additional medical attention if you develop new symptoms like vaginal discharge, worsening abdominal pain, new back pain, burning when you pee, fevers/chills, etc. -For migraine headaches, ibuprofen up to 800 mg 3 times daily taken with food.

## 2021-02-28 NOTE — ED Notes (Signed)
No answer

## 2021-02-28 NOTE — ED Triage Notes (Signed)
Pt reports having 8-9 periods within the last 2 months. She states she passed a blood lot twice. She states she has Nexplanon. Pt states she has lost a lot of weight and feels weak.

## 2021-02-28 NOTE — ED Provider Notes (Addendum)
[facility]    CSN: [id number] Arrival date & time: 02/28/21  1015      History   Chief Complaint No chief complaint on file.   HPI Sandra Foster is a 30 y.o. female presenting with vaginal bleeding and abdominal pain x2 months.  Medical history chlamydia, hernia, pyelonephritis. Nexplanon for contraception.  States her Nexplanon has been in place for almost 4 years.  States that she has been having her period almost constantly for the last 2 months.  Has been passing a lot of blood including blood clots.  Currently passing so much blood that she will go through a tampon and a pad quickly. crampy lower abd pain..  She is also feeling very fatigued and weak for the last few weeks.  Adamantly denies other symptoms including vaginal discharge, dysuria, frequency, back pain, fever/chills, STI risk.  She does have 1 female partner but needs condoms.  Wt Readings from Last 3 Encounters:  03/25/20 160 lb 3.2 oz (72.7 kg)  03/13/20 150 lb (68 kg)  01/29/20 149 lb 14.6 oz (68 kg)     HPI  Past Medical History:  Diagnosis Date  . Chlamydia   . Fetal demise    hyperglycemic hyperosmolar nonketonic syndrome  . Gestational diabetes   . Hernia   . Pregnancy induced hypertension   . Pyelonephritis     Patient Active Problem List   Diagnosis Date Noted  . Pregnancy 09/13/2015  . NVD (normal vaginal delivery) 09/13/2015  . Normal labor and delivery 09/12/2015  . Diabetes in undelivered pregnancy 08/30/2015  . 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 [redacted] weeks gestation of pregnancy   . Prior pregnancy with fetal demise and current pregnancy in second trimester   . Encounter for fetal anatomic survey     Past Surgical History:  Procedure Laterality Date  . HERNIA REPAIR      OB History    Gravida  3   Para  2   Term  2   Preterm      AB  1   Living  1     SAB  0   IAB  1   Ectopic      Multiple  0   Live Births  1            Home  Medications    Prior to Admission medications   Medication Sig Start Date End Date Taking? Authorizing Provider  megestrol (MEGACE) 40 MG tablet Take 1 tablet (40 mg total) by mouth 2 (two) times daily for 14 days. 02/28/21 03/14/21 Yes Rhys Martini, PA-C  etonogestrel (NEXPLANON) 68 MG IMPL implant 1 each by Subdermal route once.    [provider]  amitriptyline (ELAVIL) 25 MG tablet Take 1-2 tablets (25-50 mg total) by mouth at bedtime as needed (headache). 03/25/20 06/18/20  Rodolph Bong, MD    Family History Family History  Problem Relation Age of Onset  . Hypertension Maternal Grandmother     Social History Social History   Tobacco Use  . Smoking status: Former Smoker    Packs/day: 0.25  . Smokeless tobacco: Former Neurosurgeon    Quit date: 06/10/2011  Substance Use Topics  . Alcohol use: No  . Drug use: No     Allergies   Patient has no known allergies.   Review of Systems Review of Systems  Constitutional: Negative for chills and fever.  HENT: Negative for sore throat.   Eyes:  Negative for pain and redness.  Respiratory: Negative for shortness of breath.   Cardiovascular: Negative for chest pain.  Gastrointestinal: Positive for abdominal pain. Negative for diarrhea, nausea and vomiting.  Genitourinary: Positive for menstrual problem. Negative for decreased urine volume, difficulty urinating, dysuria, flank pain, frequency, genital sores, hematuria, pelvic pain, urgency, vaginal bleeding, vaginal discharge and vaginal pain.  Musculoskeletal: Negative for back pain.  Skin: Negative for rash.  All other systems reviewed and are negative.    Physical Exam Triage Vital Signs ED Triage Vitals  Enc Vitals Group     BP      Pulse      Resp      Temp      Temp src      SpO2      Weight      Height      Head Circumference      Peak Flow      Pain Score      Pain Loc      Pain Edu?      Excl. in GC?    No data found.  Updated Vital Signs BP 104/64  (BP Location: Left Arm)   Pulse 72   Temp 98.1 F (36.7 C) (Oral)   Resp 19   LMP 02/20/2021 (Exact Date)   SpO2 99%   Visual Acuity Right Eye Distance:   Left Eye Distance:   Bilateral Distance:    Right Eye Near:   Left Eye Near:    Bilateral Near:     Physical Exam Vitals reviewed.  Constitutional:      General: She is not in acute distress.    Appearance: Normal appearance. She is not ill-appearing.  HENT:     Head: Normocephalic and atraumatic.     Mouth/Throat:     Mouth: Mucous membranes are moist.     Comments: Moist mucous membranes Eyes:     Extraocular Movements: Extraocular movements intact.     Pupils: Pupils are equal, round, and reactive to light.  Cardiovascular:     Rate and Rhythm: Normal rate and regular rhythm.     Heart sounds: Normal heart sounds.  Pulmonary:     Effort: Pulmonary effort is normal.     Breath sounds: Normal breath sounds. No wheezing, rhonchi or rales.  Abdominal:     General: Bowel sounds are normal. There is no distension.     Palpations: Abdomen is soft. There is no mass.     Tenderness: There is abdominal tenderness in the right lower quadrant and suprapubic area. There is no right CVA tenderness, left CVA tenderness, guarding or rebound.     Comments: Lower abd is mildly TTP  Genitourinary:    Comments: deferred Skin:    General: Skin is warm.     Capillary Refill: Capillary refill takes less than 2 seconds.     Comments: Good skin turgor  Neurological:     General: No focal deficit present.     Mental Status: She is alert and oriented to person, place, and time.  Psychiatric:        Mood and Affect: Mood normal.        Behavior: Behavior normal.        Thought Content: Thought content normal.        Judgment: Judgment normal.      UC Treatments / Results  Labs (all labs ordered are listed, but only abnormal results are displayed) Labs Reviewed  CBC  POC URINE  PREG, ED    EKG   Radiology No results  found.  Procedures Procedures (including critical care time)  Medications Ordered in UC Medications - No data to display  Initial Impression / Assessment and Plan / UC Course  I have reviewed the triage vital signs and the nursing notes.  Pertinent labs & imaging results that were available during my care of the patient were reviewed by me and considered in my medical decision making (see chart for details).     This patient is a 30 year old female presenting with menorrhagia for the last 2 months. Nexplanon has been in place x4 years. She is afebrile and nontachycardic, no CVAT. Mild lower abd pain.  Urine pregnancy negative.  Will check a CBC/iron levels.  Megace 40mg  bid until gyn f/u. Provided enough for 2 weeks.  Pt understands she must f/u with gyn at their earliest convenience for Nexplanon removal. Continue condoms and safe sex precautions. She adamantly denies STI risk and declines STI screening today. For migraines, continue ibuprofen prn. ED return precautions discussed.   Final Clinical Impressions(s) / UC Diagnoses   Final diagnoses:  Menorrhagia with irregular cycle  Uses contraceptive implant for birth control  Chronic migraine without aura without status migrainosus, not intractable  Negative pregnancy test  Routine lab draw     Discharge Instructions     -Megace twice daily for up to 14 days. This will help stop your bleeding.  -We're checking your iron levels today. We'll call you if this is abnormal and can send an iron supplement if necessary. -Follow-up with the gynecologist who placed your nexplanon, or new gynecologoist- information below. Try to make this appointment in the next week or so. -Seek additional medical attention if you develop new symptoms like vaginal discharge, worsening abdominal pain, new back pain, burning when you pee, fevers/chills, etc. -For migraine headaches, ibuprofen up to 800 mg 3 times daily taken with food.    ED  Prescriptions    Medication Sig Dispense Auth. Provider   megestrol (MEGACE) 40 MG tablet Take 1 tablet (40 mg total) by mouth 2 (two) times daily for 14 days. 28 tablet , PA-C     PDMP not reviewed this encounter.   Rhys Martini, PA-C 02/28/21 1251    04/30/21, PA-C 02/28/21 1252

## 2021-12-15 ENCOUNTER — Encounter (HOSPITAL_COMMUNITY): Payer: Self-pay

## 2021-12-15 ENCOUNTER — Other Ambulatory Visit: Payer: Self-pay

## 2021-12-15 ENCOUNTER — Ambulatory Visit (HOSPITAL_COMMUNITY)
Admission: EM | Admit: 2021-12-15 | Discharge: 2021-12-15 | Disposition: A | Discharge status: Home / Self Care | Payer: Medicaid Other

## 2021-12-15 ENCOUNTER — Encounter (HOSPITAL_COMMUNITY): Payer: Self-pay | Admitting: Emergency Medicine

## 2021-12-15 ENCOUNTER — Emergency Department (HOSPITAL_COMMUNITY)
Admission: EM | Admit: 2021-12-15 | Discharge: 2021-12-15 | Disposition: A | Discharge status: Home / Self Care | Payer: Medicaid Other | Attending: Emergency Medicine | Admitting: Emergency Medicine

## 2021-12-15 ENCOUNTER — Emergency Department (HOSPITAL_COMMUNITY): Payer: Medicaid Other

## 2021-12-15 DIAGNOSIS — R1031 Right lower quadrant pain: Secondary | ICD-10-CM | POA: Diagnosis not present

## 2021-12-15 DIAGNOSIS — D72829 Elevated white blood cell count, unspecified: Secondary | ICD-10-CM | POA: Diagnosis not present

## 2021-12-15 DIAGNOSIS — K5000 Crohn's disease of small intestine without complications: Secondary | ICD-10-CM | POA: Insufficient documentation

## 2021-12-15 DIAGNOSIS — Z789 Other specified health status: Secondary | ICD-10-CM

## 2021-12-15 LAB — CBC WITH DIFFERENTIAL/PLATELET
Abs Immature Granulocytes: 0.04 10*3/uL (ref 0.00–0.07)
Basophils Absolute: 0.1 10*3/uL (ref 0.0–0.1)
Basophils Relative: 1 %
Eosinophils Absolute: 0.4 10*3/uL (ref 0.0–0.5)
Eosinophils Relative: 3 %
HCT: 41.8 % (ref 36.0–46.0)
Hemoglobin: 13.6 g/dL (ref 12.0–15.0)
Immature Granulocytes: 0 %
Lymphocytes Relative: 16 %
Lymphs Abs: 2 10*3/uL (ref 0.7–4.0)
MCH: 29.2 pg (ref 26.0–34.0)
MCHC: 32.5 g/dL (ref 30.0–36.0)
MCV: 89.7 fL (ref 80.0–100.0)
Monocytes Absolute: 1.1 10*3/uL — ABNORMAL HIGH (ref 0.1–1.0)
Monocytes Relative: 9 %
Neutro Abs: 9.1 10*3/uL — ABNORMAL HIGH (ref 1.7–7.7)
Neutrophils Relative %: 71 %
Platelets: 303 10*3/uL (ref 150–400)
RBC: 4.66 MIL/uL (ref 3.87–5.11)
RDW: 13.1 % (ref 11.5–15.5)
WBC: 12.6 10*3/uL — ABNORMAL HIGH (ref 4.0–10.5)
nRBC: 0 % (ref 0.0–0.2)

## 2021-12-15 LAB — I-STAT BETA HCG BLOOD, ED (MC, WL, AP ONLY): I-stat hCG, quantitative: 7.8 m[IU]/mL — ABNORMAL HIGH (ref <5)

## 2021-12-15 LAB — COMPREHENSIVE METABOLIC PANEL
ALT: 17 U/L (ref 0–44)
AST: 15 U/L (ref 15–41)
Albumin: 3.8 g/dL (ref 3.5–5.0)
Alkaline Phosphatase: 61 U/L (ref 38–126)
Anion gap: 8 (ref 5–15)
BUN: 10 mg/dL (ref 6–20)
CO2: 25 mmol/L (ref 22–32)
Calcium: 9.3 mg/dL (ref 8.9–10.3)
Chloride: 106 mmol/L (ref 98–111)
Creatinine, Ser: 0.85 mg/dL (ref 0.44–1.00)
GFR, Estimated: >60 mL/min (ref >60)
Glucose, Bld: 86 mg/dL (ref 70–99)
Potassium: 4.4 mmol/L (ref 3.5–5.1)
Sodium: 139 mmol/L (ref 135–145)
Total Bilirubin: 0.6 mg/dL (ref 0.3–1.2)
Total Protein: 7 g/dL (ref 6.5–8.1)

## 2021-12-15 LAB — URINALYSIS, ROUTINE W REFLEX MICROSCOPIC
Bilirubin Urine: NEGATIVE
Glucose, UA: NEGATIVE mg/dL
Hgb urine dipstick: NEGATIVE
Ketones, ur: 5 mg/dL — AB
Leukocytes,Ua: NEGATIVE
Nitrite: NEGATIVE
Protein, ur: NEGATIVE mg/dL
Specific Gravity, Urine: 1.02 (ref 1.005–1.030)
pH: 7 (ref 5.0–8.0)

## 2021-12-15 LAB — LIPASE, BLOOD: Lipase: 26 U/L (ref 11–51)

## 2021-12-15 MED ORDER — IOHEXOL 300 MG/ML  SOLN
100.0000 mL | Freq: Once | INTRAMUSCULAR | Status: AC | PRN
  Administered 2021-12-15: 100 mL via INTRAVENOUS

## 2021-12-15 MED ORDER — METRONIDAZOLE 500 MG PO TABS
500.0000 mg | ORAL_TABLET | Freq: Two times a day (BID) | ORAL | 0 refills | Status: AC
Start: 2021-12-15 — End: 2021-12-20

## 2021-12-15 MED ORDER — CIPROFLOXACIN HCL 500 MG PO TABS: 500.0000 mg | ORAL_TABLET | Freq: Two times a day (BID) | ORAL | 0 refills | Status: AC

## 2021-12-15 NOTE — ED Provider Triage Note (Signed)
Emergency Medicine Provider Triage Evaluation Note  Sandra Foster , a 31 y.o. female  was evaluated in triage.  Pt complains of lower quadrant abdominal tenderness.  Patient states that her symptoms started about a week and a half ago.  They have been constant in the right lower quadrant and a gradually worsened.  She was seen by urgent care today for evaluation, and they sent her here to have appendicitis ruled out.  She states that she took a pregnancy test last night that was negative.  She denies any nausea or vomiting.  Denies any constipation or diarrhea.  She has not had any fevers.  She denies any flank pain, dysuria, or hematuria..  Review of Systems  Positive: Abdominal pain Negative:   Physical Exam  BP 115/62 (BP Location: Right Arm)    Pulse 77    Temp 98.7 F (37.1 C) (Oral)    Resp 16    SpO2 99%  Gen:   Awake, no distress   Resp:  Normal effort  MSK:   Moves extremities without difficulty  Other:  Abdomen is soft.  It is tender in the right lower quadrant and suprapubic region.  There is no guarding but patient is grimacing during exam.  Per nurse report, patient did report pain in the right lower quadrant when standing straight and flexing on tiptoes.  Medical Decision Making  Medically screening exam initiated at 11:22 AM.  Appropriate orders placed.  Sandra Foster was informed that the remainder of the evaluation will be completed by another provider, this initial triage assessment does not replace that evaluation, and the importance of remaining in the ED until their evaluation is complete.  Patient does have some evidence of peritoneal signs.  Need to rule out pregnancy first.  If negative, will proceed with CT abdomen pelvis.   Adolphus Birchwood, PA-C 12/15/21 1125

## 2021-12-15 NOTE — Discharge Instructions (Addendum)
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 I have placed you on antibiotics that you can pick up at the pharmacy. You need to follow up with gastroenterology for further testing. Please call the phone number listed in your discharge paperwork to get an appointment set up.

## 2021-12-15 NOTE — Discharge Instructions (Addendum)
-  Head to the ED to rule out appendicitis, ovarian cyst/torsion, etc. These conditions are emergent and potentially life threatening.

## 2021-12-15 NOTE — ED Provider Notes (Signed)
[place] [facility] EMERGENCY DEPARTMENT Provider Note   CSN: [id number] Arrival date & time: 12/15/21  1038     History  Chief Complaint  Patient presents with   Abdominal Pain    Sandra Foster is a 31 y.o. female.  No notable past medical history.  Pt complains of right lower quadrant abdominal tenderness.  Patient states that her symptoms started about a week and a half ago.  They have been constant in the right lower quadrant and a gradually worsened.  She was seen by urgent care today for evaluation, and they sent her here to have appendicitis ruled out.  She states that she took a pregnancy test last night that was negative.  She denies any nausea or vomiting.  Denies any constipation or diarrhea.  She has not had any fevers.  She denies any flank pain, dysuria, or hematuria..   Abdominal Pain     Home Medications Prior to Admission medications   Medication Sig Start Date End Date Taking? Authorizing Provider  ciprofloxacin (CIPRO) 500 MG tablet Take 1 tablet (500 mg total) by mouth every 12 (twelve) hours for 5 days. 12/15/21 12/20/21 Yes Tamora Huneke, Adora Fridge, PA-C  metroNIDAZOLE (FLAGYL) 500 MG tablet Take 1 tablet (500 mg total) by mouth 2 (two) times daily for 5 days. 12/15/21 12/20/21 Yes Osei Anger, Adora Fridge, PA-C  etonogestrel (NEXPLANON) 68 MG IMPL implant 1 each by Subdermal route once.    [provider]  amitriptyline (ELAVIL) 25 MG tablet Take 1-2 tablets (25-50 mg total) by mouth at bedtime as needed (headache). 03/25/20 06/18/20  Gregor Hams, MD      Allergies    Patient has no known allergies.    Review of Systems   Review of Systems  Gastrointestinal:  Positive for abdominal pain.  All other systems reviewed and are negative.  Physical Exam Updated Vital Signs BP 99/63    Pulse 75    Temp 98.7 F (37.1 C) (Oral)    Resp 16    Ht 5\' 6"  (1.676 m)    Wt 72.6 kg    SpO2 99%    BMI 25.82 kg/m  Physical Exam Vitals and nursing note reviewed.   Constitutional:      General: She is not in acute distress.    Appearance: Normal appearance. She is not ill-appearing, toxic-appearing or diaphoretic.  HENT:     Head: Normocephalic and atraumatic.     Nose: No nasal deformity.     Mouth/Throat:     Lips: Pink. No lesions.     Mouth: Mucous membranes are moist. No injury, lacerations, oral lesions or angioedema.     Pharynx: Oropharynx is clear. Uvula midline. No pharyngeal swelling, oropharyngeal exudate, posterior oropharyngeal erythema or uvula swelling.  Eyes:     General: Gaze aligned appropriately. No scleral icterus.       Right eye: No discharge.        Left eye: No discharge.     Conjunctiva/sclera: Conjunctivae normal.     Right eye: Right conjunctiva is not injected. No exudate or hemorrhage.    Left eye: Left conjunctiva is not injected. No exudate or hemorrhage.    Pupils: Pupils are equal, round, and reactive to light.  Cardiovascular:     Rate and Rhythm: Normal rate and regular rhythm.     Pulses: Normal pulses.          Radial pulses are 2+ on the right side and 2+ on the left side.  Dorsalis pedis pulses are 2+ on the right side and 2+ on the left side.     Heart sounds: Normal heart sounds, S1 normal and S2 normal. Heart sounds not distant. No murmur heard.   No friction rub. No gallop. No S3 or S4 sounds.  Pulmonary:     Effort: Pulmonary effort is normal. No accessory muscle usage or respiratory distress.     Breath sounds: Normal breath sounds. No stridor. No wheezing, rhonchi or rales.  Chest:     Chest wall: No tenderness.  Abdominal:     General: Abdomen is flat. Bowel sounds are normal. There is no distension.     Palpations: Abdomen is soft. There is no mass or pulsatile mass.     Tenderness: There is abdominal tenderness in the right lower quadrant. There is guarding. There is no right CVA tenderness, left CVA tenderness or rebound. Positive signs include McBurney's sign. Negative signs include  Rovsing's sign.     Hernia: No hernia is present.  Musculoskeletal:     Right lower leg: No edema.     Left lower leg: No edema.  Skin:    General: Skin is warm and dry.     Coloration: Skin is not jaundiced or pale.     Findings: No bruising, erythema, lesion or rash.  Neurological:     General: No focal deficit present.     Mental Status: She is alert and oriented to person, place, and time.     GCS: GCS eye subscore is 4. GCS verbal subscore is 5. GCS motor subscore is 6.  Psychiatric:        Mood and Affect: Mood normal.        Behavior: Behavior normal. Behavior is cooperative.    ED Results / Procedures / Treatments   Labs (all labs ordered are listed, but only abnormal results are displayed) Labs Reviewed  CBC WITH DIFFERENTIAL/PLATELET - Abnormal; Notable for the following components:      Result Value   WBC 12.6 (*)    Neutro Abs 9.1 (*)    Monocytes Absolute 1.1 (*)    All other components within normal limits  URINALYSIS, ROUTINE W REFLEX MICROSCOPIC - Abnormal; Notable for the following components:   APPearance HAZY (*)    Ketones, ur 5 (*)    All other components within normal limits  I-STAT BETA HCG BLOOD, ED ([facility], [facility], [facility] ONLY) - Abnormal; Notable for the following components:   I-stat hCG, quantitative 7.8 (*)    All other components within normal limits  COMPREHENSIVE METABOLIC PANEL  LIPASE, BLOOD  PREGNANCY, URINE    EKG None  Radiology CT Abdomen Pelvis W Contrast  Result Date: 12/15/2021 CLINICAL DATA:  RIGHT lower quadrant pain. Positive i-STAT beta HCG, CT performed at referring MD request EXAM: CT ABDOMEN AND PELVIS WITH CONTRAST TECHNIQUE: Multidetector CT imaging of the abdomen and pelvis was performed using the standard protocol following bolus administration of intravenous contrast. RADIATION DOSE REDUCTION: This exam was performed according to the departmental dose-optimization program which includes automated exposure control, adjustment of  the mA and/or kV according to patient size and/or use of iterative reconstruction technique. CONTRAST:  133mL OMNIPAQUE IOHEXOL 300 MG/ML SOLN IV. No oral contrast. COMPARISON:  None FINDINGS: Lower chest: Lung bases clear Hepatobiliary: Gallbladder normal appearance. Nonspecific low-attenuation focus RIGHT lobe liver somewhat linear, uncertain etiology. Remainder of liver normal appearance. Pancreas: Normal appearance Spleen: Normal appearance Adrenals/Urinary Tract: Adrenal glands, kidneys, ureters, and bladder normal appearance  Stomach/Bowel: Normal appendix, retrocecal. Significant wall thickening of terminal ileum extending to ileocecal valve with associated mucosal hyperenhancement and surrounding inflammatory changes consistent with terminal ileitis; differential diagnosis includes infection and inflammatory bowel disease/Crohn's disease. Mucosal enhancement of the distal ileum extend slightly more proximally than the area of wall thickening and infiltrative changes. Stomach and remaining bowel loops normal appearance. Vascular/Lymphatic: Vascular structures patent. Retroaortic LEFT renal vein. No adenopathy. Reproductive: Single small focus of enhancement 7 mm within the upper uterus, nonspecific. Uterus and adnexa otherwise unremarkable. Other: Small amount of free pelvic fluid.  No free air.  No hernia. Musculoskeletal: Unremarkable IMPRESSION: Significant wall thickening of terminal ileum extending to ileocecal valve with associated mucosal hyperenhancement and surrounding inflammatory changes consistent with terminal ileitis; differential diagnosis includes infection and inflammatory bowel disease/Crohn's disease. Small amount of free pelvic fluid. Normal appendix. Electronically Signed   By: Lavonia Dana M.D.   On: 12/15/2021 13:25    Procedures Procedures   Medications Ordered in ED Medications  iohexol (OMNIPAQUE) 300 MG/ML solution 100 mL (100 mLs Intravenous Contrast Given 12/15/21 1313)     ED Course/ Medical Decision Making/ A&P Clinical Course as of 12/15/21 1914  Fri Dec 15, 2021  1358 Spoke with Azucena Freed from GI. Recommend outpatient colonoscopy. Tx empirically with flagyl and cipro for now.  [GL]  [id number] This is a 31 year old female who presents to the emergency department with right lower quadrant abdominal pain.  She was sent over here by the urgent care.  Her initial labs show a trace leukocytosis to 12.6.  CBC is otherwise reassuring.  CMP is without abnormality.  Urinalysis with no evidence of bacteria or infection.  Lipase is negative.  She does have a tracely elevated hCG which is likely a false positive.  Plan to obtain urine pregnancy.  We will proceed with CT abdomen pelvis to rule out appendicitis.  CT reveals significant terminal ileitis.  I spoke with Jaci Standard from GI.  They recommend outpatient colonoscopy as long as patient is stable and able to be discharged.  We will treat empirically with Flagyl and Cipro.  On reassessment, abdominal pain is improved.  Patient appears well and has had normal vitals.  I feel that she is stable for discharge at this time. [GL]    Clinical Course User Index [GL] Sherre Poot Adora Fridge, PA-C                           Medical Decision Making Amount and/or Complexity of Data Reviewed Labs: ordered. Radiology: ordered.  Risk Prescription drug management.   MDM:  This patient presents to the ED for concern of right lower quadrant abdominal pain, this involves an extensive number of treatment options, and is a complaint that carries with it a high risk of complications and morbidity.  The differential diagnosis includes ectopic pregnancy, ovarian cyst, ovarian torsion, appendicitis, constipation, urolithiasis. Her initial labs show a trace leukocytosis to 12.6.  CBC is otherwise reassuring.  CMP is without abnormality.  Urinalysis with no evidence of bacteria or infection.  Lipase is negative.  She does have a tracely  elevated hCG which is likely a false positive.  Plan to obtain urine pregnancy.  We will proceed with CT abdomen pelvis to rule out appendicitis.  CT reveals significant terminal ileitis.  I spoke with Jaci Standard from GI.  They recommend outpatient colonoscopy as long as patient is stable and able to be discharged.  We will treat  empirically with Flagyl and Cipro.  On reassessment, abdominal pain is improved.  Patient appears well and has had normal vitals.  I feel that she is stable for discharge at this time.   Co morbidities that complicate the patient evaluation:  previous pregnancies   Additional History:  Additional history obtained from n/a  External records from outside source obtained and reviewed including [facility] urgent care note from earlier today as well as their physical exam findings and recommendations.   Labs:  I Ordered, and personally interpreted labs.  The pertinent results include:  WBC 12.6, negative urine, kidney function normal, lipase normal, pregnancy with trace hcg elevation, likely false positive.    Imaging:  I ordered imaging studies including CT abdomen/pelvis I independently visualized and interpreted imaging which showed  Significant wall thickening of terminal ileum extending to ileocecal  valve with associated mucosal hyperenhancement and surrounding  inflammatory changes consistent with terminal ileitis; differential  diagnosis includes infection and inflammatory bowel disease/Crohn's  disease.   I agree with the radiologist interpretation   Consultations: I requested consultation with the gastroenterolgist,  and discussed lab and imaging findings as well as pertinent plan - they recommend: f/u in office for colonoscopy. Abx treatment with cipro and flagyl for ppx   Reevaluation:  After the interventions noted above, I reevaluated the patient and found that they have :improved   Social Determinants of Health: No PCP, no insurance   Disposition:  After  consideration of the diagnostic results and the patients response to treatment, I feel that the patent would benefit from GI f/u.   Final Clinical Impression(s) / ED Diagnoses Final diagnoses:  Terminal ileitis without complication ([place])    Rx / DC Orders ED Discharge Orders          Ordered    metroNIDAZOLE (FLAGYL) 500 MG tablet  2 times daily        12/15/21 1408    ciprofloxacin (CIPRO) 500 MG tablet  Every 12 hours        12/15/21 1408              Dayton Sherr, Cherlyn Roberts 12/15/21 1914    Blanchie Dessert, MD 12/18/21 1918

## 2021-12-15 NOTE — ED Triage Notes (Signed)
Pt sent down from UC to r/o appendicitis , pain rlq for about 1 week , no n/v

## 2021-12-15 NOTE — ED Provider Notes (Signed)
[place]    CSN: [id number] Arrival date & time: 12/15/21  [id number]      History   Chief Complaint Chief Complaint  Patient presents with   Abdominal Pain    HPI Sandra Foster is a 31 y.o. female presenting with abdominal pain for 1 week.  Medical history hernia repair, though she denies any history of abdominal procedures.  Nexplanon contraception.  Describes right lower quadrant pain for 1 week, getting worse.  Bowel movements are regular, last bowel movement was 1 day ago and still passing gas.  Some radiation to the suprapubic region, but denies flank pain, fever/chills, urinary symptoms including dysuria, hematuria, frequency, vaginal symptoms.  She still has her appendix and gallbladder.  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 Authorizing Provider  etonogestrel (NEXPLANON) 68 MG IMPL implant 1 each by Subdermal route once.    [provider]  amitriptyline (ELAVIL) 25 MG tablet Take 1-2 tablets (25-50 mg total) by mouth  at bedtime as needed (headache). 03/25/20 06/18/20  Gregor Hams, MD    Family History Family History  Problem Relation Age of Onset   Hypertension Maternal Grandmother     Social History Social History   Tobacco Use   Smoking status: Former    Packs/day: 0.25    Types: Cigarettes   Smokeless tobacco: Former    Quit date: 06/10/2011  Substance Use Topics   Alcohol use: No   Drug use: No     Allergies   Patient has no known allergies.   Review of Systems Review of Systems  Gastrointestinal:  Positive for abdominal pain.  All other systems reviewed and are negative.   Physical Exam Triage Vital Signs ED Triage Vitals [12/15/21 0939]  Enc Vitals Group     BP 122/74     Pulse Rate 87     Resp 16     Temp 98.1 F (36.7 C)     Temp Source Oral     SpO2 100 %     Weight      Height      Head Circumference      Peak Flow      Pain  Score 7     Pain Loc      Pain Edu?      Excl. in [place]?    No data found.  Updated Vital Signs BP 122/74 (BP Location: Left Arm)    Pulse 87    Temp 98.1 F (36.7 C) (Oral)    Resp 16    SpO2 100%   Visual Acuity Right Eye Distance:   Left Eye Distance:   Bilateral Distance:    Right Eye Near:   Left Eye Near:    Bilateral Near:     Physical Exam Vitals reviewed.  Constitutional:      General: She is not in acute distress.    Appearance: Normal appearance. She is not ill-appearing.  HENT:     Head: Normocephalic and atraumatic.     Mouth/Throat:     Mouth: Mucous membranes are moist.     Comments: Moist mucous membranes Eyes:     Extraocular Movements: Extraocular movements intact.     Pupils: Pupils are equal, round, and reactive to light.  Cardiovascular:     Rate and Rhythm: Normal rate and regular rhythm.     Heart sounds: Normal heart sounds.  Pulmonary:     Effort: Pulmonary effort is normal.     Breath sounds: Normal breath sounds. No wheezing, rhonchi or rales.  Abdominal:     General: Bowel sounds are  normal. There is no distension.     Palpations: Abdomen is soft. There is no mass.     Tenderness: There is abdominal tenderness in the right lower quadrant, periumbilical area and suprapubic area. There is no right CVA tenderness, left CVA tenderness, guarding or rebound. Positive signs include McBurney's sign. Negative signs include Murphy's sign and Rovsing's sign.     Comments: RLQ tenderness to palpation with guarding but no rebound. No obvious mass or hernia.   Skin:    General: Skin is warm.     Capillary Refill: Capillary refill takes less than 2 seconds.     Comments: Good skin turgor  Neurological:     General: No focal deficit present.     Mental Status: She is alert and oriented to person, place, and time.  Psychiatric:        Mood and Affect: Mood normal.        Behavior: Behavior normal.     UC Treatments / Results  Labs (all labs ordered are listed, but only abnormal results are displayed) Labs Reviewed - No data to display  EKG   Radiology No results found.  Procedures Procedures (including critical care time)  Medications Ordered in UC Medications - No data to display  Initial Impression / Assessment and Plan / UC Course  I have reviewed the triage vital signs and the nursing notes.  Pertinent labs & imaging results that were available during my care of the patient were reviewed by me and considered in my medical decision making (see chart for details).     This patient is a very pleasant 31 y.o. year old female presenting with RLQ pain. Afebrile, nontachy. History hernia repair 2016, denies other abd procedures. Still has her appendix and gallbladder. Uses contraceptive implant for birth control. Negative home pregnancy test 1 day ago.   Pt states pain is 10/10. Ddx is appendicitis vs ovarian cyst. Sent to ED via personal vehicle.  ED return precautions discussed. Patient verbalizes understanding and agreement.   Final Clinical Impressions(s) / UC  Diagnoses   Final diagnoses:  Uses contraceptive implant for birth control  RLQ abdominal pain     Discharge Instructions      -Head to the ED to rule out appendicitis, ovarian cyst/torsion, etc. These conditions are emergent and potentially life threatening.      ED Prescriptions   None    PDMP not reviewed this encounter.   Hazel Sams, PA-C 12/15/21 1107

## 2021-12-15 NOTE — ED Triage Notes (Signed)
Pt reports right side abdominal pain x 1 week. She reports normal bowel movements. She is on Nexplanon for birth control.

## 2022-01-08 ENCOUNTER — Encounter (HOSPITAL_COMMUNITY): Payer: Self-pay | Admitting: Emergency Medicine

## 2022-01-08 ENCOUNTER — Inpatient Hospital Stay (HOSPITAL_COMMUNITY)
Admission: EM | Admit: 2022-01-08 | Discharge: 2022-01-13 | DRG: 386 | Disposition: A | Discharge status: Home / Self Care | Payer: Medicaid Other | Attending: Family Medicine | Admitting: Family Medicine

## 2022-01-08 ENCOUNTER — Emergency Department (HOSPITAL_COMMUNITY): Payer: Medicaid Other

## 2022-01-08 ENCOUNTER — Other Ambulatory Visit: Payer: Self-pay

## 2022-01-08 DIAGNOSIS — R1011 Right upper quadrant pain: Secondary | ICD-10-CM

## 2022-01-08 DIAGNOSIS — R197 Diarrhea, unspecified: Secondary | ICD-10-CM | POA: Diagnosis not present

## 2022-01-08 DIAGNOSIS — Z8632 Personal history of gestational diabetes: Secondary | ICD-10-CM | POA: Diagnosis not present

## 2022-01-08 DIAGNOSIS — K5000 Crohn's disease of small intestine without complications: Secondary | ICD-10-CM | POA: Diagnosis present

## 2022-01-08 DIAGNOSIS — E860 Dehydration: Secondary | ICD-10-CM | POA: Diagnosis present

## 2022-01-08 DIAGNOSIS — K632 Fistula of intestine: Secondary | ICD-10-CM | POA: Diagnosis present

## 2022-01-08 DIAGNOSIS — F32A Depression, unspecified: Secondary | ICD-10-CM | POA: Diagnosis present

## 2022-01-08 DIAGNOSIS — K50013 Crohn's disease of small intestine with fistula: Principal | ICD-10-CM

## 2022-01-08 DIAGNOSIS — F419 Anxiety disorder, unspecified: Secondary | ICD-10-CM | POA: Diagnosis present

## 2022-01-08 DIAGNOSIS — E876 Hypokalemia: Secondary | ICD-10-CM

## 2022-01-08 DIAGNOSIS — R7303 Prediabetes: Secondary | ICD-10-CM | POA: Diagnosis present

## 2022-01-08 DIAGNOSIS — Z20822 Contact with and (suspected) exposure to covid-19: Secondary | ICD-10-CM | POA: Diagnosis present

## 2022-01-08 DIAGNOSIS — Z87891 Personal history of nicotine dependence: Secondary | ICD-10-CM

## 2022-01-08 DIAGNOSIS — R1031 Right lower quadrant pain: Secondary | ICD-10-CM | POA: Diagnosis present

## 2022-01-08 DIAGNOSIS — E119 Type 2 diabetes mellitus without complications: Secondary | ICD-10-CM | POA: Diagnosis not present

## 2022-01-08 LAB — URINALYSIS, ROUTINE W REFLEX MICROSCOPIC
Bilirubin Urine: NEGATIVE
Glucose, UA: NEGATIVE mg/dL
Hgb urine dipstick: NEGATIVE
Ketones, ur: NEGATIVE mg/dL
Leukocytes,Ua: NEGATIVE
Nitrite: NEGATIVE
Protein, ur: NEGATIVE mg/dL
Specific Gravity, Urine: 1.023 (ref 1.005–1.030)
pH: 6 (ref 5.0–8.0)

## 2022-01-08 LAB — COMPREHENSIVE METABOLIC PANEL WITH GFR
ALT: 15 U/L (ref 0–44)
AST: 12 U/L — ABNORMAL LOW (ref 15–41)
Albumin: 3.2 g/dL — ABNORMAL LOW (ref 3.5–5.0)
Alkaline Phosphatase: 50 U/L (ref 38–126)
Anion gap: 7 (ref 5–15)
BUN: 9 mg/dL (ref 6–20)
CO2: 25 mmol/L (ref 22–32)
Calcium: 8.5 mg/dL — ABNORMAL LOW (ref 8.9–10.3)
Chloride: 105 mmol/L (ref 98–111)
Creatinine, Ser: 0.72 mg/dL (ref 0.44–1.00)
GFR, Estimated: >60 mL/min
Glucose, Bld: 107 mg/dL — ABNORMAL HIGH (ref 70–99)
Potassium: 3.4 mmol/L — ABNORMAL LOW (ref 3.5–5.1)
Sodium: 137 mmol/L (ref 135–145)
Total Bilirubin: 0.5 mg/dL (ref 0.3–1.2)
Total Protein: 6.5 g/dL (ref 6.5–8.1)

## 2022-01-08 LAB — RAPID URINE DRUG SCREEN, HOSP PERFORMED
Amphetamines: NOT DETECTED
Barbiturates: NOT DETECTED
Benzodiazepines: NOT DETECTED
Cocaine: NOT DETECTED
Opiates: NOT DETECTED
Tetrahydrocannabinol: POSITIVE — AB

## 2022-01-08 LAB — I-STAT BETA HCG BLOOD, ED (MC, WL, AP ONLY): I-stat hCG, quantitative: <5 m[IU]/mL

## 2022-01-08 LAB — CBC
HCT: 36.9 % (ref 36.0–46.0)
Hemoglobin: 12.1 g/dL (ref 12.0–15.0)
MCH: 28.9 pg (ref 26.0–34.0)
MCHC: 32.8 g/dL (ref 30.0–36.0)
MCV: 88.3 fL (ref 80.0–100.0)
Platelets: 300 10*3/uL (ref 150–400)
RBC: 4.18 MIL/uL (ref 3.87–5.11)
RDW: 13 % (ref 11.5–15.5)
WBC: 14.6 10*3/uL — ABNORMAL HIGH (ref 4.0–10.5)
nRBC: 0 % (ref 0.0–0.2)

## 2022-01-08 LAB — LIPASE, BLOOD: Lipase: 36 U/L (ref 11–51)

## 2022-01-08 LAB — RESP PANEL BY RT-PCR (FLU A&B, COVID) ARPGX2
Influenza A by PCR: NEGATIVE
Influenza B by PCR: NEGATIVE
SARS Coronavirus 2 by RT PCR: NEGATIVE

## 2022-01-08 LAB — SEDIMENTATION RATE: Sed Rate: 28 mm/hr — ABNORMAL HIGH (ref 0–22)

## 2022-01-08 MED ORDER — ACETAMINOPHEN 325 MG PO TABS
650.0000 mg | ORAL_TABLET | Freq: Four times a day (QID) | ORAL | Status: DC | PRN
  Administered 2022-01-10: 650 mg via ORAL
  Filled 2022-01-08: qty 2

## 2022-01-08 MED ORDER — PEG 3350-KCL-NA BICARB-NACL 420 G PO SOLR
4000.0000 mL | Freq: Once | ORAL | Status: AC
  Administered 2022-01-08: 4000 mL via ORAL
  Filled 2022-01-08: qty 4000

## 2022-01-08 MED ORDER — IOHEXOL 300 MG/ML  SOLN
100.0000 mL | Freq: Once | INTRAMUSCULAR | Status: AC | PRN
  Administered 2022-01-08: 100 mL via INTRAVENOUS

## 2022-01-08 MED ORDER — ENOXAPARIN SODIUM 40 MG/0.4ML IJ SOSY
40.0000 mg | PREFILLED_SYRINGE | INTRAMUSCULAR | Status: DC
  Administered 2022-01-08 – 2022-01-12 (×4): 40 mg via SUBCUTANEOUS
  Filled 2022-01-08 (×5): qty 0.4

## 2022-01-08 MED ORDER — OXYCODONE-ACETAMINOPHEN 5-325 MG PO TABS
1.0000 | ORAL_TABLET | Freq: Four times a day (QID) | ORAL | Status: DC | PRN
  Administered 2022-01-08 – 2022-01-12 (×4): 1 via ORAL
  Filled 2022-01-08 (×4): qty 1

## 2022-01-08 MED ORDER — OXYCODONE-ACETAMINOPHEN 5-325 MG PO TABS
1.0000 | ORAL_TABLET | Freq: Once | ORAL | Status: AC
  Administered 2022-01-08: 1 via ORAL
  Filled 2022-01-08: qty 1

## 2022-01-08 NOTE — H&P (View-Only) (Signed)
Reason for Consult: Right lower quadrant pain/abnormal CT scan ?Referring Physician: ER MD ? ?Sandra Foster is an 31 y.o. female.  ?HPI: Sandra Foster is a 31 year old black female with multiple medical problems listed below who presented to the emergency room for the second time in the last couple of weeks of right lower quadrant pain.  A CT scan of the abdomen pelvis done today showed prominent inflammation the distal terminal ileum extending to the cecum with adjacent inflammatory fluid stranding and prominent mesenteric lymph nodes consistent with terminal ileitis complicated by fistulous tract to the adjacent small bowel-rule out IBD. Patient was in the ER on 12/15/2021 when a CT scan of the abdomen pelvis was done and she was noted to have significant bowel wall thickening of the terminal ileum extending to the ileocecal valve associated with mucosal hyperenhancement and surrounding inflammatory changes consistent with terminal ileitis. [facility]  was consulted but the patient was not hospitalized and was sent home on Cipro and Flagyl with plans for an outpatient colonoscopy which was not scheduled.  She denies any previous history of diarrhea or abdominal pain prior to 12/15/2021.  There is no history of melena hematochezia joint pains, eye problems, aphthous ulcers or skin lesions.  She denies a family history of ulcerative colitis or Crohn's disease or colon cancer.  She denies any problems with diarrhea constipation or rectal bleeding. ? ?Past Medical History:  ?Diagnosis Date  ? Chlamydia   ? Fetal demise   ? hyperglycemic hyperosmolar nonketonic syndrome  ? Gestational diabetes   ? Hernia   ? Pregnancy induced hypertension   ? Pyelonephritis   ? ?Past Surgical History:  ?Procedure Laterality Date  ? HERNIA REPAIR    ? ?Family History  ?Problem Relation Age of Onset  ? Hypertension Maternal Grandmother   ? ?Social History:  reports that she has quit smoking. She smoked an average of .25 packs per day. She  quit smokeless tobacco use about 10 years ago. She reports that she does not drink alcohol and does not use drugs. ? ?Allergies: No Known Allergies ? ?Medications: I have reviewed the patient's current medications. ?Prior to Admission:  ?Medications Prior to Admission  ?Medication Sig Dispense Refill Last Dose  ? etonogestrel (NEXPLANON) 68 MG IMPL implant 1 each by Subdermal route once.   unknown  ? ?Scheduled: ? enoxaparin (LOVENOX) injection  40 mg Subcutaneous Q24H  ? polyethylene glycol-electrolytes  4,000 mL Oral Once  ? ?Continuous: ? ?Results for orders placed or performed during the hospital encounter of 01/08/22 (from the past 48 hour(s))  ?Lipase, blood     Status: None  ? Collection Time: 01/08/22 11:20 AM  ?Result Value Ref Range  ? Lipase 36 11 - 51 U/L  ?  Comment: Performed at [facility], [place] [address], [place],  [address]  ?Comprehensive metabolic panel     Status: Abnormal  ? Collection Time: 01/08/22 11:20 AM  ?Result Value Ref Range  ? Sodium 137 135 - 145 mmol/L  ? Potassium 3.4 (L) 3.5 - 5.1 mmol/L  ? Chloride 105 98 - 111 mmol/L  ? CO2 25 22 - 32 mmol/L  ? Glucose, Bld 107 (H) 70 - 99 mg/dL  ?  Comment: Glucose reference range applies only to samples taken after fasting for at least 8 hours.  ? BUN 9 6 - 20 mg/dL  ? Creatinine, Ser 0.72 0.44 - 1.00 mg/dL  ? Calcium 8.5 (L) 8.9 - 10.3 mg/dL  ? Total  Protein 6.5 6.5 - 8.1 g/dL  ? Albumin 3.2 (L) 3.5 - 5.0 g/dL  ? AST 12 (L) 15 - 41 U/L  ? ALT 15 0 - 44 U/L  ? Alkaline Phosphatase 50 38 - 126 U/L  ? Total Bilirubin 0.5 0.3 - 1.2 mg/dL  ? GFR, Estimated >60 >60 mL/min  ?  Comment: (NOTE) ?Calculated using the CKD-EPI Creatinine Equation (2021) ?  ? Anion gap 7 5 - 15  ?  Comment: Performed at [facility], [place] [address] [place], [place] [address]  ?CBC     Status: Abnormal  ? Collection Time: 01/08/22 11:20 AM  ?Result Value Ref Range  ? WBC 14.6 (H) 4.0 - 10.5 K/uL  ? RBC 4.18 3.87 - 5.11 MIL/uL  ? Hemoglobin 12.1 12.0 -  15.0 g/dL  ? HCT 36.9 36.0 - 46.0 %  ? MCV 88.3 80.0 - 100.0 fL  ? MCH 28.9 26.0 - 34.0 pg  ? MCHC 32.8 30.0 - 36.0 g/dL  ? RDW 13.0 11.5 - 15.5 %  ? Platelets 300 150 - 400 K/uL  ? nRBC 0.0 0.0 - 0.2 %  ?  Comment: Performed at [facility], [place] [address] [place], [place] [address]  ?I-Stat beta hCG blood, ED     Status: None  ? Collection Time: 01/08/22 11:35 AM  ?Result Value Ref Range  ? I-stat hCG, quantitative <5.0 <5 mIU/mL  ? Comment 3          ?  Comment:   GEST. AGE      CONC.  (mIU/mL) ?  <=1 WEEK        5 - 50 ?    2 WEEKS       50 - 500 ?    3 WEEKS       100 - 10,000 ?    4 WEEKS     1,000 - 30,000 ?       ?FEMALE AND NON-PREGNANT FEMALE: ?    LESS THAN 5 mIU/mL ?  ?Urinalysis, Routine w reflex microscopic Urine, Clean Catch     Status: Abnormal  ? Collection Time: 01/08/22 11:57 AM  ?Result Value Ref Range  ? Color, Urine YELLOW YELLOW  ? APPearance HAZY (A) CLEAR  ? Specific Gravity, Urine 1.023 1.005 - 1.030  ? pH 6.0 5.0 - 8.0  ? Glucose, UA NEGATIVE NEGATIVE mg/dL  ? Hgb urine dipstick NEGATIVE NEGATIVE  ? Bilirubin Urine NEGATIVE NEGATIVE  ? Ketones, ur NEGATIVE NEGATIVE mg/dL  ? Protein, ur NEGATIVE NEGATIVE mg/dL  ? Nitrite NEGATIVE NEGATIVE  ? Leukocytes,Ua NEGATIVE NEGATIVE  ?  Comment: Performed at [facility], [place] [address], [place], [place] [address]  ?Resp Panel by RT-PCR (Flu A&B, Covid) Nasopharyngeal Swab     Status: None  ? Collection Time: 01/08/22  3:07 PM  ? Specimen: Nasopharyngeal Swab; Nasopharyngeal(NP) swabs in vial transport medium  ?Result Value Ref Range  ? SARS Coronavirus 2 by RT PCR NEGATIVE NEGATIVE  ?  Comment: (NOTE) ?SARS-CoV-2 target nucleic acids are NOT DETECTED. ? ?The SARS-CoV-2 RNA is generally detectable in upper respiratory ?specimens during the acute phase of infection. The lowest ?concentration of SARS-CoV-2 viral copies this assay can detect is ?138 copies/mL. A negative result does not preclude SARS-Cov-2 ?infection and should not be used as  the sole basis for treatment or ?other patient management decisions. A negative result may occur with  ?improper specimen collection/handling, submission of specimen other ?than  nasopharyngeal swab, presence of viral mutation(s) within the ?areas targeted by this assay, and inadequate number of viral ?copies(<138 copies/mL). A negative result must be combined with ?clinical observations, patient history, and epidemiological ?information. The expected result is Negative. ? ?Fact Sheet for Patients:  ?EntrepreneurPulse.com.au ? ?Fact Sheet for Healthcare Providers:  ?IncredibleEmployment.be ? ?This test is no t yet approved or cleared by the [place] FDA and  ?has been authorized for detection and/or diagnosis of SARS-CoV-2 by ?FDA under an Emergency Use Authorization (EUA). This EUA will remain  ?in effect (meaning this test can be used) for the duration of the ?COVID-19 declaration under Section 564(b)(1) of the Act, 21 ?U.S.C.section 360bbb-3(b)(1), unless the authorization is terminated  ?or revoked sooner.  ? ? ?  ? Influenza A by PCR NEGATIVE NEGATIVE  ? Influenza B by PCR NEGATIVE NEGATIVE  ?  Comment: (NOTE) ?The Xpert Xpress SARS-CoV-2/FLU/RSV plus assay is intended as an aid ?in the diagnosis of influenza from Nasopharyngeal swab specimens and ?should not be used as a sole basis for treatment. Nasal washings and ?aspirates are unacceptable for Xpert Xpress SARS-CoV-2/FLU/RSV ?testing. ? ?Fact Sheet for Patients: ?EntrepreneurPulse.com.au ? ?Fact Sheet for Healthcare Providers: ?IncredibleEmployment.be ? ?This test is not yet approved or cleared by the [place] FDA and ?has been authorized for detection and/or diagnosis of SARS-CoV-2 by ?FDA under an Emergency Use Authorization (EUA). This EUA will remain ?in effect (meaning this test can be used) for the duration of the ?COVID-19 declaration under Section 564(b)(1) of the Act, 21  U.S.C. ?section 360bbb-3(b)(1), unless the authorization is terminated or ?revoked. ? ?Performed at [facility], [place] [address], [place], [place] ?[address] ?  ?Rapid urine drug screen (hospita

## 2022-01-08 NOTE — ED Notes (Signed)
ED TO INPATIENT HANDOFF REPORT  ED Nurse Name and Phone #: Kyley Solow RN [phone]  S Name/Age/Gender Sandra Foster 31 y.o. female Room/Bed: 046C/046C  Code Status   Code Status: Full Code  Home/SNF/Other Home Patient oriented to: self, place, time, and situation Is this baseline? Yes   Triage Complete: Triage complete  Chief Complaint Abdominal pain, RUQ [R10.11] Terminal ileitis (HCC) [K50.00]  Triage Note Patient here with worsening RLQ abdominal pain that started a few weeks ago, patient states she was seen and referred to surgery for evaluation of appendicitis two weeks ago but has not followed up.  Patient alert, oriented, and in no apparent distress at this time.   Allergies No Known Allergies  Level of Care/Admitting Diagnosis ED Disposition     ED Disposition  Admit   Condition  --   Comment  Hospital Area: [facility] [[id number]]  Level of Care: Med-Surg [16]  May admit patient to [facility] or [facility] if equivalent level of care is available:: Yes  Covid Evaluation: Asymptomatic Screening Protocol (No Symptoms)  Diagnosis: Terminal ileitis [facility]) [[id number]]  Admitting Physician: Park Pope [[id number]]  Attending Physician: Janit Pagan T [[id number]]  Estimated length of stay: past midnight tomorrow  Certification:: I certify this patient will need inpatient services for at least 2 midnights          B Medical/Surgery History Past Medical History:  Diagnosis Date   Chlamydia    Fetal demise    hyperglycemic hyperosmolar nonketonic syndrome   Gestational diabetes    Hernia    Pregnancy induced hypertension    Pyelonephritis    Past Surgical History:  Procedure Laterality Date   HERNIA REPAIR       A IV Location/Drains/Wounds Patient Lines/Drains/Airways Status     Active Line/Drains/Airways     Name Placement date Placement time Site Days   Peripheral IV 01/08/22 20 G 1" Right Antecubital 01/08/22  1253  Antecubital  less  than 1            Intake/Output Last 24 hours No intake or output data in the 24 hours ending 01/08/22 1819  Labs/Imaging Results for orders placed or performed during the hospital encounter of 01/08/22 (from the past 48 hour(s))  Lipase, blood     Status: None   Collection Time: 01/08/22 11:20 AM  Result Value Ref Range   Lipase 36 11 - 51 U/L    Comment: Performed at [facility], [address], [place], [place] [address]  Comprehensive metabolic panel     Status: Abnormal   Collection Time: 01/08/22 11:20 AM  Result Value Ref Range   Sodium 137 135 - 145 mmol/L   Potassium 3.4 (L) 3.5 - 5.1 mmol/L   Chloride 105 98 - 111 mmol/L   CO2 25 22 - 32 mmol/L   Glucose, Bld 107 (H) 70 - 99 mg/dL    Comment: Glucose reference range applies only to samples taken after fasting for at least 8 hours.   BUN 9 6 - 20 mg/dL   Creatinine, Ser 2.44 0.44 - 1.00 mg/dL   Calcium 8.5 (L) 8.9 - 10.3 mg/dL   Total Protein 6.5 6.5 - 8.1 g/dL   Albumin 3.2 (L) 3.5 - 5.0 g/dL   AST 12 (L) 15 - 41 U/L   ALT 15 0 - 44 U/L   Alkaline Phosphatase 50 38 - 126 U/L   Total Bilirubin 0.5 0.3 - 1.2 mg/dL   GFR, Estimated >  60 >60 mL/min    Comment: (NOTE) Calculated using the CKD-EPI Creatinine Equation (2021)    Anion gap 7 5 - 15    Comment: Performed at [facility], [address], [place], [place] [address]  CBC     Status: Abnormal   Collection Time: 01/08/22 11:20 AM  Result Value Ref Range   WBC 14.6 (H) 4.0 - 10.5 K/uL   RBC 4.18 3.87 - 5.11 MIL/uL   Hemoglobin 12.1 12.0 - 15.0 g/dL   HCT 99.2 42.6 - 83.4 %   MCV 88.3 80.0 - 100.0 fL   MCH 28.9 26.0 - 34.0 pg   MCHC 32.8 30.0 - 36.0 g/dL   RDW 19.6 22.2 - 97.9 %   Platelets 300 150 - 400 K/uL   nRBC 0.0 0.0 - 0.2 %    Comment: Performed at [facility], [address], [place], [place] [address]  I-Stat beta hCG blood, ED     Status: None   Collection Time: 01/08/22 11:35 AM  Result Value Ref Range   I-stat hCG,  quantitative <5.0 <5 mIU/mL   Comment 3            Comment:   GEST. AGE      CONC.  (mIU/mL)   <=1 WEEK        5 - 50     2 WEEKS       50 - 500     3 WEEKS       100 - 10,000     4 WEEKS     1,000 - 30,000        FEMALE AND NON-PREGNANT FEMALE:     LESS THAN 5 mIU/mL   Urinalysis, Routine w reflex microscopic Urine, Clean Catch     Status: Abnormal   Collection Time: 01/08/22 11:57 AM  Result Value Ref Range   Color, Urine YELLOW YELLOW   APPearance HAZY (A) CLEAR   Specific Gravity, Urine 1.023 1.005 - 1.030   pH 6.0 5.0 - 8.0   Glucose, UA NEGATIVE NEGATIVE mg/dL   Hgb urine dipstick NEGATIVE NEGATIVE   Bilirubin Urine NEGATIVE NEGATIVE   Ketones, ur NEGATIVE NEGATIVE mg/dL   Protein, ur NEGATIVE NEGATIVE mg/dL   Nitrite NEGATIVE NEGATIVE   Leukocytes,Ua NEGATIVE NEGATIVE    Comment: Performed at [facility], [address] [place], [place] [address]  Resp Panel by RT-PCR (Flu A&B, Covid) Nasopharyngeal Swab     Status: None   Collection Time: 01/08/22  3:07 PM   Specimen: Nasopharyngeal Swab; Nasopharyngeal(NP) swabs in vial transport medium  Result Value Ref Range   SARS Coronavirus 2 by RT PCR NEGATIVE NEGATIVE    Comment: (NOTE) SARS-CoV-2 target nucleic acids are NOT DETECTED.  The SARS-CoV-2 RNA is generally detectable in upper respiratory specimens during the acute phase of infection. The lowest concentration of SARS-CoV-2 viral copies this assay can detect is 138 copies/mL. A negative result does not preclude SARS-Cov-2 infection and should not be used as the sole basis for treatment or other patient management decisions. A negative result may occur with  improper specimen collection/handling, submission of specimen other than nasopharyngeal swab, presence of viral mutation(s) within the areas targeted by this assay, and inadequate number of viral copies(<138 copies/mL). A negative result must be combined with clinical observations, patient history, and  epidemiological information. The expected result is Negative.  Fact Sheet for Patients:  BloggerCourse.com  Fact Sheet for Healthcare Providers:  SeriousBroker.it  This test is no t yet approved or cleared by the [place] and  has been authorized for detection and/or diagnosis of SARS-CoV-2 by FDA under an Emergency Use Authorization (EUA). This EUA will remain  in effect (meaning this test can be used) for the duration of the COVID-19 declaration under Section 564(b)(1) of the Act, 21 U.S.C.section 360bbb-3(b)(1), unless the authorization is terminated  or revoked sooner.       Influenza A by PCR NEGATIVE NEGATIVE   Influenza B by PCR NEGATIVE NEGATIVE    Comment: (NOTE) The Xpert Xpress SARS-CoV-2/FLU/RSV plus assay is intended as an aid in the diagnosis of influenza from Nasopharyngeal swab specimens and should not be used as a sole basis for treatment. Nasal washings and aspirates are unacceptable for Xpert Xpress SARS-CoV-2/FLU/RSV testing.  Fact Sheet for Patients: BloggerCourse.com  Fact Sheet for Healthcare Providers: SeriousBroker.it  This test is not yet approved or cleared by the [place] FDA and has been authorized for detection and/or diagnosis of SARS-CoV-2 by FDA under an Emergency Use Authorization (EUA). This EUA will remain in effect (meaning this test can be used) for the duration of the COVID-19 declaration under Section 564(b)(1) of the Act, 21 U.S.C. section 360bbb-3(b)(1), unless the authorization is terminated or revoked.  Performed at [facility], [address], [place], [place] [address]   Rapid urine drug screen (hospital performed)     Status: Abnormal   Collection Time: 01/08/22  3:23 PM  Result Value Ref Range   Opiates NONE DETECTED NONE DETECTED   Cocaine NONE DETECTED NONE DETECTED   Benzodiazepines NONE DETECTED  NONE DETECTED   Amphetamines NONE DETECTED NONE DETECTED   Tetrahydrocannabinol POSITIVE (A) NONE DETECTED   Barbiturates NONE DETECTED NONE DETECTED    Comment: (NOTE) DRUG SCREEN FOR MEDICAL PURPOSES ONLY.  IF CONFIRMATION IS NEEDED FOR ANY PURPOSE, NOTIFY LAB WITHIN 5 DAYS.  LOWEST DETECTABLE LIMITS FOR URINE DRUG SCREEN Drug Class                     Cutoff (ng/mL) Amphetamine and metabolites    1000 Barbiturate and metabolites    200 Benzodiazepine                 200 Tricyclics and metabolites     300 Opiates and metabolites        300 Cocaine and metabolites        300 THC                            50 Performed at [facility], [address] [place], [place] [address]    CT ABDOMEN PELVIS W CONTRAST  Result Date: 01/08/2022 CLINICAL DATA:  Right lower quadrant abdominal pain times few weeks. EXAM: CT ABDOMEN AND PELVIS WITH CONTRAST TECHNIQUE: Multidetector CT imaging of the abdomen and pelvis was performed using the standard protocol following bolus administration of intravenous contrast. RADIATION DOSE REDUCTION: This exam was performed according to the departmental dose-optimization program which includes automated exposure control, adjustment of the mA and/or kV according to patient size and/or use of iterative reconstruction technique. CONTRAST:  OMNIPAQUE IOHEXOL 300 MG/ML  SOLN COMPARISON:  December 15, 2021 FINDINGS: Lower chest: No acute abnormality. Hepatobiliary: No suspicious hepatic lesion. Gallbladder is unremarkable. No biliary ductal dilation. Pancreas: No pancreatic ductal dilation or evidence of acute inflammation. Spleen: Within normal limits. Adrenals/Urinary Tract: Bilateral adrenal glands appear  normal. Symmetric bilateral renal enhancement. No hydronephrosis. Urinary bladder is unremarkable for degree of distension. Stomach/Bowel: No enteric contrast was administered. Stomach is unremarkable. Normal appendix. Similar prominent inflammation of the  distal/terminal ileum extending through the cecum with adjacent fluid/stranding and prominent mesenteric lymph nodes. No pneumatosis, no pneumoperitoneum, no drainable fluid collection. There is a linear band of fluid/inflammation extending from terminal ileum to an adjacent of small bowel with some tethered appearance of the bowel possibly reflecting a fistula on image 56/3. Vascular/Lymphatic: Normal caliber abdominal aorta. Prominent right lower quadrant lymph nodes measuring up to 9 mm Reproductive: Focus of hyperenhancement in the uterine fundus is similar prior and possibly reflects a small uterine leiomyoma. No suspicious adnexal mass. Other: Trace pelvic free fluid is greater on the right and likely reflects a combination inflammatory fluid from the process in the terminal ileum and physiologic free fluid. No perianal disease identified. Musculoskeletal: No acute osseous abnormality. IMPRESSION: 1. Similar prominent inflammation of the distal/terminal ileum extending through the cecum with adjacent inflammatory fluid/stranding and prominent mesenteric lymph nodes, consistent with terminal ileitis. Primary differential considerations in this patient given appearance on prior CT and persistence today is inflammatory bowel disease/Crohn's with less likely differential consideration of infectious bowel disease. 2. Linear band of fluid/inflammation extending from the terminal ileum to an adjacent loop of small bowel with a slight tethered appearance of the bowel may reflect a fistula. 3.  No pneumoperitoneum and no drainable fluid collection. Electronically Signed   By: Maudry Mayhew M.D.   On: 01/08/2022 13:22    Pending Labs Unresulted Labs (From admission, onward)     Start     Ordered   01/09/22 0500  Basic metabolic panel  Tomorrow morning,   R        01/08/22 1630   01/09/22 0500  CBC  Tomorrow morning,   R        01/08/22 1630   01/08/22 1632  Sedimentation rate  Once,   R        01/08/22 1632    01/08/22 1632  C-reactive protein  Once,   R        01/08/22 1632   01/08/22 1625  HIV Antibody (routine testing w rflx)  (HIV Antibody (Routine testing w reflex) panel)  Once,   R        01/08/22 1630            Vitals/Pain Today's Vitals   01/08/22 1415 01/08/22 1434 01/08/22 1500 01/08/22 1527  BP: 113/70  (!) 97/58   Pulse:   92   Resp: 17  18   Temp:      TempSrc:      SpO2:   95%   PainSc:  7   5     Isolation Precautions No active isolations  Medications Medications  oxyCODONE-acetaminophen (PERCOCET/ROXICET) 5-325 MG per tablet 1 tablet (has no administration in time range)  acetaminophen (TYLENOL) tablet 650 mg (has no administration in time range)  enoxaparin (LOVENOX) injection 40 mg (has no administration in time range)  iohexol (OMNIPAQUE) 300 MG/ML solution 100 mL (100 mLs Intravenous Contrast Given 01/08/22 1308)  oxyCODONE-acetaminophen (PERCOCET/ROXICET) 5-325 MG per tablet 1 tablet (1 tablet Oral Given 01/08/22 1434)    Mobility walks Low fall risk    R Recommendations: See Admitting Provider Note  Report given to: Lu Duffel RN  Additional Notes:

## 2022-01-08 NOTE — Hospital Course (Addendum)
Sandra Foster is a 31 y.o. female presenting with RLQ abdominal pain . PMH is significant for visiting ED 2/17 for similar complaints, gestational diabetes, hernia s/p repair, G2p1 on nexplanon  RLQ pain   Diarrhea Pt seen in ED 3 weeks prior, dx with terminal ileitis and sent home with Flagyl and cipro but abdominal pain persisted and in addition pt began having diarrhea. In ED, BP soft at 95/54 otherwise VSS, WBC 14.6, Lipase and AST/ALT WNL and UA unremarkable. 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 GI  was consulted and recommended tx with steriods. Pt had baseline CRP of *** and sed rate of ***. Colonoscopy showed ***.

## 2022-01-08 NOTE — ED Triage Notes (Signed)
Patient here with worsening RLQ abdominal pain that started a few weeks ago, patient states she was seen and referred to surgery for evaluation of appendicitis two weeks ago but has not followed up.  Patient alert, oriented, and in no apparent distress at this time. ?

## 2022-01-08 NOTE — ED Provider Notes (Cosign Needed)
[facility] Provider Note   CSN: [id number] Arrival date & time: 01/08/22  1101     History No notable past medical history  Chief Complaint  Patient presents with   Abdominal Pain    Sandra Foster is a 31 y.o. female. Patient presents the emergency department with right lower quadrant abdominal pain.  She was actually seen here 3 weeks ago for the exact same symptoms.  At that time, she was diagnosed with terminal ileitis and was discharged home on Flagyl and Cipro.  She states that after this, she took all of her antibiotics to completion.  She says 3 days after she was seen in the ED, the pain returned.  She says it is gradually worsened since then and is now worse than it was before. She has associated diarrhea.  She was given an initial referral for gastroenterology, however she never contacted them to schedule an appointment.  She denies any other associated symptoms such as nausea, vomiting, or constipation.  She denies any fevers or chills.  She denies any family history of Crohn's disease or personal history of Crohn's disease.   Abdominal Pain Associated symptoms: diarrhea       Home Medications Prior to Admission medications   Medication Sig Start Date End Date Taking? Authorizing Provider  etonogestrel (NEXPLANON) 68 MG IMPL implant 1 each by Subdermal route once.    [provider]  amitriptyline (ELAVIL) 25 MG tablet Take 1-2 tablets (25-50 mg total) by mouth at bedtime as needed (headache). 03/25/20 06/18/20  Gregor Hams, MD      Allergies    Patient has no known allergies.    Review of Systems   Review of Systems  Gastrointestinal:  Positive for abdominal pain and diarrhea.  All other systems reviewed and are negative.  Physical Exam Updated Vital Signs BP 110/75    Pulse 90    Temp 98.9 F (37.2 C) (Oral)    Resp 12    SpO2 100%  Physical Exam Vitals and nursing note reviewed.  Constitutional:       General: She is not in acute distress.    Appearance: Normal appearance. She is not ill-appearing, toxic-appearing or diaphoretic.  HENT:     Head: Normocephalic and atraumatic.     Nose: No nasal deformity.     Mouth/Throat:     Lips: Pink. No lesions.     Mouth: Mucous membranes are moist. No injury, lacerations, oral lesions or angioedema.     Pharynx: Oropharynx is clear. Uvula midline. No pharyngeal swelling, oropharyngeal exudate, posterior oropharyngeal erythema or uvula swelling.  Eyes:     General: Gaze aligned appropriately. No scleral icterus.       Right eye: No discharge.        Left eye: No discharge.     Conjunctiva/sclera: Conjunctivae normal.     Right eye: Right conjunctiva is not injected. No exudate or hemorrhage.    Left eye: Left conjunctiva is not injected. No exudate or hemorrhage.    Pupils: Pupils are equal, round, and reactive to light.  Cardiovascular:     Rate and Rhythm: Normal rate and regular rhythm.     Pulses: Normal pulses.          Radial pulses are 2+ on the right side and 2+ on the left side.       Dorsalis pedis pulses are 2+ on the right side and 2+ on the left side.  Heart sounds: Normal heart sounds, S1 normal and S2 normal. Heart sounds not distant. No murmur heard.   No friction rub. No gallop. No S3 or S4 sounds.  Pulmonary:     Effort: Pulmonary effort is normal. No accessory muscle usage or respiratory distress.     Breath sounds: Normal breath sounds. No stridor. No wheezing, rhonchi or rales.  Chest:     Chest wall: No tenderness.  Abdominal:     General: Abdomen is flat. There is no distension.     Palpations: Abdomen is soft. There is no mass or pulsatile mass.     Tenderness: There is abdominal tenderness in the right lower quadrant. There is guarding. There is no right CVA tenderness, left CVA tenderness or rebound. Positive signs include McBurney's sign. Negative signs include Murphy's sign, Rovsing's sign, psoas sign and  obturator sign.  Musculoskeletal:     Right lower leg: No edema.     Left lower leg: No edema.  Skin:    General: Skin is warm and dry.     Coloration: Skin is not jaundiced or pale.     Findings: No bruising, erythema, lesion or rash.  Neurological:     General: No focal deficit present.     Mental Status: She is alert and oriented to person, place, and time.     GCS: GCS eye subscore is 4. GCS verbal subscore is 5. GCS motor subscore is 6.  Psychiatric:        Mood and Affect: Mood normal.        Behavior: Behavior normal. Behavior is cooperative.    ED Results / Procedures / Treatments   Labs (all labs ordered are listed, but only abnormal results are displayed) Labs Reviewed  COMPREHENSIVE METABOLIC PANEL - Abnormal; Notable for the following components:      Result Value   Potassium 3.4 (*)    Glucose, Bld 107 (*)    Calcium 8.5 (*)    Albumin 3.2 (*)    AST 12 (*)    All other components within normal limits  CBC - Abnormal; Notable for the following components:   WBC 14.6 (*)    All other components within normal limits  URINALYSIS, ROUTINE W REFLEX MICROSCOPIC - Abnormal; Notable for the following components:   APPearance HAZY (*)    All other components within normal limits  LIPASE, BLOOD  I-STAT BETA HCG BLOOD, ED ([facility], [facility], [facility] ONLY)    EKG None  Radiology CT ABDOMEN PELVIS W CONTRAST  Result Date: 01/08/2022 CLINICAL DATA:  Right lower quadrant abdominal pain times few weeks. EXAM: CT ABDOMEN AND PELVIS WITH CONTRAST TECHNIQUE: Multidetector CT imaging of the abdomen and pelvis was performed using the standard protocol following bolus administration of intravenous contrast. RADIATION DOSE REDUCTION: This exam was performed according to the departmental dose-optimization program which includes automated exposure control, adjustment of the mA and/or kV according to patient size and/or use of iterative reconstruction technique. CONTRAST:  130mL OMNIPAQUE IOHEXOL 300  MG/ML  SOLN COMPARISON:  December 15, 2021 FINDINGS: Lower chest: No acute abnormality. Hepatobiliary: No suspicious hepatic lesion. Gallbladder is unremarkable. No biliary ductal dilation. Pancreas: No pancreatic ductal dilation or evidence of acute inflammation. Spleen: Within normal limits. Adrenals/Urinary Tract: Bilateral adrenal glands appear normal. Symmetric bilateral renal enhancement. No hydronephrosis. Urinary bladder is unremarkable for degree of distension. Stomach/Bowel: No enteric contrast was administered. Stomach is unremarkable. Normal appendix. Similar prominent inflammation of the distal/terminal ileum extending through the cecum with  adjacent fluid/stranding and prominent mesenteric lymph nodes. No pneumatosis, no pneumoperitoneum, no drainable fluid collection. There is a linear band of fluid/inflammation extending from terminal ileum to an adjacent of small bowel with some tethered appearance of the bowel possibly reflecting a fistula on image 56/3. Vascular/Lymphatic: Normal caliber abdominal aorta. Prominent right lower quadrant lymph nodes measuring up to 9 mm Reproductive: Focus of hyperenhancement in the uterine fundus is similar prior and possibly reflects a small uterine leiomyoma. No suspicious adnexal mass. Other: Trace pelvic free fluid is greater on the right and likely reflects a combination inflammatory fluid from the process in the terminal ileum and physiologic free fluid. No perianal disease identified. Musculoskeletal: No acute osseous abnormality. IMPRESSION: 1. Similar prominent inflammation of the distal/terminal ileum extending through the cecum with adjacent inflammatory fluid/stranding and prominent mesenteric lymph nodes, consistent with terminal ileitis. Primary differential considerations in this patient given appearance on prior CT and persistence today is inflammatory bowel disease/Crohn's with less likely differential consideration of infectious bowel disease. 2.  Linear band of fluid/inflammation extending from the terminal ileum to an adjacent loop of small bowel with a slight tethered appearance of the bowel may reflect a fistula. 3.  No pneumoperitoneum and no drainable fluid collection. Electronically Signed   By: Dahlia Bailiff M.D.   On: 01/08/2022 13:22    Procedures Procedures   Medications Ordered in ED Medications  oxyCODONE-acetaminophen (PERCOCET/ROXICET) 5-325 MG per tablet 1 tablet (has no administration in time range)  iohexol (OMNIPAQUE) 300 MG/ML solution 100 mL (100 mLs Intravenous Contrast Given 01/08/22 1308)    ED Course/ Medical Decision Making/ A&P Clinical Course as of 01/08/22 1522  Mon Jan 08, 2022  1432 Spoke with Jerene Pitch from general surgery. She does not think that the fistula would be a surgical problem. Recommends consulting GI.  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  She will likely need steroid therapy, surgery following in the hospital, and imaging. [GL]    Clinical Course User Index [GL] Sherre Poot Adora Fridge, PA-C                           Medical Decision Making Amount and/or Complexity of Data Reviewed Labs: ordered.  Risk Prescription drug management. Decision regarding hospitalization.    MDM  This is a 31 y.o. female who presents to the ED with right lower quadrant abdominal pain.  She was recently diagnosed with terminal ileitis thought to be from an infectious cause. She was placed on Flagyl and Cipro with GI follow up. She never followed up and the pain returned worse than it was before. Pain is still localized to RLQ. No other associated systemic symptoms other than continued diarrhea are present.   I personally ordered, reviewed, and interpreted all laboratory work and imaging and agree  with radiologist interpretation. Results interpreted below: CBC with slightly worsening leukocytosis to 14.6. CMP with mild hypokalemia to 3.4. Pregnancy negative. Lipase normal. UA normal.   CT results 1. Similar prominent inflammation of the distal/terminal ileum  extending through the cecum with adjacent inflammatory  fluid/stranding and prominent mesenteric lymph nodes, consistent  with terminal ileitis. Primary differential considerations in this  patient given appearance on prior CT and persistence today is  inflammatory bowel disease/Crohn's with less likely differential  consideration of infectious bowel disease.     2. Linear band of fluid/inflammation extending from the terminal  ileum to an adjacent loop of small bowel with a slight tethered  appearance of the bowel may reflect a fistula.     3.  No pneumoperitoneum and no drainable fluid collection.   Similar findings of terminal ileitis present, however, this time there is a possible fistula present on imaging that extends from terminal ileum to an adjacent loop of small bowel.   I am going to consult general surgery to evaluate fistula concern. Likely will end up consulting GI as well.  [place] with Jerene Pitch from general surgery. She does not think that the fistula would be a surgical problem. Recommends consulting GI.  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  She will likely need steroid therapy, surgery following in the hospital, and imaging. [GL]   Family Medicine Service consulted for admission.  Charting Requirements Additional history is obtained from:  Independent historian External Records from outside source obtained and reviewed including: Recent ED visit Social Determinants of Health:  none Pertinant  PMH that complicates patient's illness: recent hx of terminal ileitis  Patient Care Problems that were addressed during this visit: - RLQ abdominal pain: Acute illness with complication Medications given in ED: Percocet Reevaluation of the patient after these medicines showed that the patient improved Consultations: GI and surgery Disposition: admit  I have discussed this patient with my attending physician, Dr. Doren Custard who has made changes to the plan accordingly.  Portions of this note were generated with [employer]. Dictation errors may occur despite best attempts at proofreading.    Final Clinical Impression(s) / ED Diagnoses Final diagnoses:  None    Rx / DC Orders ED Discharge Orders     None         Adolphus Birchwood, PA-C 01/08/22 1524

## 2022-01-08 NOTE — ED Provider Triage Note (Signed)
Emergency Medicine Provider Triage Evaluation Note ? ?Sandra Foster , a 31 y.o. female  was evaluated in triage.  Pt complains of right lower quadrant pain.  She reports that 2 weeks ago she was told her appendix was inflamed and over the past few days she has noted increasing right lower quadrant pain.  Occasional episodes of diarrhea.  Says it is worse when she is walking.  Denies constipation, nausea or vomiting ? ?Review of Systems  ?See above ?Physical Exam  ?BP 97/62 (BP Location: Right Arm)   Pulse 90   Temp 98.9 ?F (37.2 ?C) (Oral)   Resp 16   SpO2 100%  ?Gen:   Awake, no distress   ?Resp:  Normal effort  ?MSK:   Moves extremities without difficulty  ?Other:  Positive McBurney's, periumbilical tenderness ? ?Medical Decision Making  ?Medically screening exam initiated at 11:54 AM.  Appropriate orders placed.  Sandra Foster was informed that the remainder of the evaluation will be completed by another provider, this initial triage assessment does not replace that evaluation, and the importance of remaining in the ED until their evaluation is complete. ? ? ? ?Abdominal labs ordered ?  ?Cassandre Oleksy A, PA-C ?01/08/22 1155 ? ?

## 2022-01-08 NOTE — Consult Note (Addendum)
Reason for Consult: Right lower quadrant pain/abnormal CT scan Referring Physician: ER MD  Sandra Foster is an 31 y.o. female.  HPI: Sandra Foster is a 31 year old black female with multiple medical problems listed below who presented to the emergency room for the second time in the last couple of weeks of right lower quadrant pain.  A CT scan of the abdomen pelvis done today showed prominent inflammation the distal terminal ileum extending to the cecum with adjacent inflammatory fluid stranding and prominent mesenteric lymph nodes consistent with terminal ileitis complicated by fistulous tract to the adjacent small bowel-rule out IBD. Patient was in the ER on 12/15/2021 when a CT scan of the abdomen pelvis was done and she was noted to have significant bowel wall thickening of the terminal ileum extending to the ileocecal valve associated with mucosal hyperenhancement and surrounding inflammatory changes consistent with terminal ileitis. [facility]  was consulted but the patient was not hospitalized and was sent home on Cipro and Flagyl with plans for an outpatient colonoscopy which was not scheduled.  She denies any previous history of diarrhea or abdominal pain prior to 12/15/2021.  There is no history of melena hematochezia joint pains, eye problems, aphthous ulcers or skin lesions.  She denies a family history of ulcerative colitis or Crohn's disease or colon cancer.  She denies any problems with diarrhea constipation or rectal bleeding.  Past Medical History:  Diagnosis Date   Chlamydia    Fetal demise    hyperglycemic hyperosmolar nonketonic syndrome   Gestational diabetes    Hernia    Pregnancy induced hypertension    Pyelonephritis    Past Surgical History:  Procedure Laterality Date   HERNIA REPAIR     Family History  Problem Relation Age of Onset   Hypertension Maternal Grandmother    Social History:  reports that she has quit smoking. She smoked an average of .25 packs per day. She  quit smokeless tobacco use about 10 years ago. She reports that she does not drink alcohol and does not use drugs.  Allergies: No Known Allergies  Medications: I have reviewed the patient's current medications. Prior to Admission:  Medications Prior to Admission  Medication Sig Dispense Refill Last Dose   etonogestrel (NEXPLANON) 68 MG IMPL implant 1 each by Subdermal route once.   unknown   Scheduled:  enoxaparin (LOVENOX) injection  40 mg Subcutaneous Q24H   polyethylene glycol-electrolytes  4,000 mL Oral Once   Continuous:  Results for orders placed or performed during the hospital encounter of 01/08/22 (from the past 48 hour(s))  Lipase, blood     Status: None   Collection Time: 01/08/22 11:20 AM  Result Value Ref Range   Lipase 36 11 - 51 U/L    Comment: Performed at [facility], [address] [place], [place] [address]  Comprehensive metabolic panel     Status: Abnormal   Collection Time: 01/08/22 11:20 AM  Result Value Ref Range   Sodium 137 135 - 145 mmol/L   Potassium 3.4 (L) 3.5 - 5.1 mmol/L   Chloride 105 98 - 111 mmol/L   CO2 25 22 - 32 mmol/L   Glucose, Bld 107 (H) 70 - 99 mg/dL    Comment: Glucose reference range applies only to samples taken after fasting for at least 8 hours.   BUN 9 6 - 20 mg/dL   Creatinine, Ser 5.28 0.44 - 1.00 mg/dL   Calcium 8.5 (L) 8.9 - 10.3 mg/dL   Total  Protein 6.5 6.5 - 8.1 g/dL   Albumin 3.2 (L) 3.5 - 5.0 g/dL   AST 12 (L) 15 - 41 U/L   ALT 15 0 - 44 U/L   Alkaline Phosphatase 50 38 - 126 U/L   Total Bilirubin 0.5 0.3 - 1.2 mg/dL   GFR, Estimated >40 >98 mL/min    Comment: (NOTE) Calculated using the CKD-EPI Creatinine Equation (2021)    Anion gap 7 5 - 15    Comment: Performed at [facility], [address], [place], [place] [address]  CBC     Status: Abnormal   Collection Time: 01/08/22 11:20 AM  Result Value Ref Range   WBC 14.6 (H) 4.0 - 10.5 K/uL   RBC 4.18 3.87 - 5.11 MIL/uL   Hemoglobin 12.1 12.0 -  15.0 g/dL   HCT 78.2 95.6 - 21.3 %   MCV 88.3 80.0 - 100.0 fL   MCH 28.9 26.0 - 34.0 pg   MCHC 32.8 30.0 - 36.0 g/dL   RDW 08.6 57.8 - 46.9 %   Platelets 300 150 - 400 K/uL   nRBC 0.0 0.0 - 0.2 %    Comment: Performed at [facility], [address] [place], [place] [address]  I-Stat beta hCG blood, ED     Status: None   Collection Time: 01/08/22 11:35 AM  Result Value Ref Range   I-stat hCG, quantitative <5.0 <5 mIU/mL   Comment 3            Comment:   GEST. AGE      CONC.  (mIU/mL)   <=1 WEEK        5 - 50     2 WEEKS       50 - 500     3 WEEKS       100 - 10,000     4 WEEKS     1,000 - 30,000        FEMALE AND NON-PREGNANT FEMALE:     LESS THAN 5 mIU/mL   Urinalysis, Routine w reflex microscopic Urine, Clean Catch     Status: Abnormal   Collection Time: 01/08/22 11:57 AM  Result Value Ref Range   Color, Urine YELLOW YELLOW   APPearance HAZY (A) CLEAR   Specific Gravity, Urine 1.023 1.005 - 1.030   pH 6.0 5.0 - 8.0   Glucose, UA NEGATIVE NEGATIVE mg/dL   Hgb urine dipstick NEGATIVE NEGATIVE   Bilirubin Urine NEGATIVE NEGATIVE   Ketones, ur NEGATIVE NEGATIVE mg/dL   Protein, ur NEGATIVE NEGATIVE mg/dL   Nitrite NEGATIVE NEGATIVE   Leukocytes,Ua NEGATIVE NEGATIVE    Comment: Performed at [facility], [address] [place], [place] [address]  Resp Panel by RT-PCR (Flu A&B, Covid) Nasopharyngeal Swab     Status: None   Collection Time: 01/08/22  3:07 PM   Specimen: Nasopharyngeal Swab; Nasopharyngeal(NP) swabs in vial transport medium  Result Value Ref Range   SARS Coronavirus 2 by RT PCR NEGATIVE NEGATIVE    Comment: (NOTE) SARS-CoV-2 target nucleic acids are NOT DETECTED.  The SARS-CoV-2 RNA is generally detectable in upper respiratory specimens during the acute phase of infection. The lowest concentration of SARS-CoV-2 viral copies this assay can detect is 138 copies/mL. A negative result does not preclude SARS-Cov-2 infection and should not be used as  the sole basis for treatment or other patient management decisions. A negative result may occur with  improper specimen collection/handling, submission of specimen other than  nasopharyngeal swab, presence of viral mutation(s) within the areas targeted by this assay, and inadequate number of viral copies(<138 copies/mL). A negative result must be combined with clinical observations, patient history, and epidemiological information. The expected result is Negative.  Fact Sheet for Patients:  BloggerCourse.com  Fact Sheet for Healthcare Providers:  SeriousBroker.it  This test is no t yet approved or cleared by the [place] FDA and  has been authorized for detection and/or diagnosis of SARS-CoV-2 by FDA under an Emergency Use Authorization (EUA). This EUA will remain  in effect (meaning this test can be used) for the duration of the COVID-19 declaration under Section 564(b)(1) of the Act, 21 U.S.C.section 360bbb-3(b)(1), unless the authorization is terminated  or revoked sooner.       Influenza A by PCR NEGATIVE NEGATIVE   Influenza B by PCR NEGATIVE NEGATIVE    Comment: (NOTE) The Xpert Xpress SARS-CoV-2/FLU/RSV plus assay is intended as an aid in the diagnosis of influenza from Nasopharyngeal swab specimens and should not be used as a sole basis for treatment. Nasal washings and aspirates are unacceptable for Xpert Xpress SARS-CoV-2/FLU/RSV testing.  Fact Sheet for Patients: BloggerCourse.com  Fact Sheet for Healthcare Providers: SeriousBroker.it  This test is not yet approved or cleared by the [place] FDA and has been authorized for detection and/or diagnosis of SARS-CoV-2 by FDA under an Emergency Use Authorization (EUA). This EUA will remain in effect (meaning this test can be used) for the duration of the COVID-19 declaration under Section 564(b)(1) of the Act, 21  U.S.C. section 360bbb-3(b)(1), unless the authorization is terminated or revoked.  Performed at [facility], [address] [place], [place] [address]   Rapid urine drug screen (hospital performed)     Status: Abnormal   Collection Time: 01/08/22  3:23 PM  Result Value Ref Range   Opiates NONE DETECTED NONE DETECTED   Cocaine NONE DETECTED NONE DETECTED   Benzodiazepines NONE DETECTED NONE DETECTED   Amphetamines NONE DETECTED NONE DETECTED   Tetrahydrocannabinol POSITIVE (A) NONE DETECTED   Barbiturates NONE DETECTED NONE DETECTED    Comment: (NOTE) DRUG SCREEN FOR MEDICAL PURPOSES ONLY.  IF CONFIRMATION IS NEEDED FOR ANY PURPOSE, NOTIFY LAB WITHIN 5 DAYS.  LOWEST DETECTABLE LIMITS FOR URINE DRUG SCREEN Drug Class                     Cutoff (ng/mL) Amphetamine and metabolites    1000 Barbiturate and metabolites    200 Benzodiazepine                 200 Tricyclics and metabolites     300 Opiates and metabolites        300 Cocaine and metabolites        300 THC                            50 Performed at [facility], [address] [place], [place] [address]     CT ABDOMEN PELVIS W CONTRAST  Result Date: 01/08/2022 CLINICAL DATA:  Right lower quadrant abdominal pain times few weeks. EXAM: CT ABDOMEN AND PELVIS WITH CONTRAST TECHNIQUE: Multidetector CT imaging of the abdomen and pelvis was performed using the standard protocol following bolus administration of intravenous contrast. RADIATION DOSE REDUCTION: This exam was performed according to the departmental dose-optimization program which includes automated exposure control, adjustment of the mA and/or kV according to patient size and/or use  of iterative reconstruction technique. CONTRAST:  OMNIPAQUE IOHEXOL 300 MG/ML  SOLN COMPARISON:  December 15, 2021 FINDINGS: Lower chest: No acute abnormality. Hepatobiliary: No suspicious hepatic lesion. Gallbladder is unremarkable. No biliary ductal dilation.  Pancreas: No pancreatic ductal dilation or evidence of acute inflammation. Spleen: Within normal limits. Adrenals/Urinary Tract: Bilateral adrenal glands appear normal. Symmetric bilateral renal enhancement. No hydronephrosis. Urinary bladder is unremarkable for degree of distension. Stomach/Bowel: No enteric contrast was administered. Stomach is unremarkable. Normal appendix. Similar prominent inflammation of the distal/terminal ileum extending through the cecum with adjacent fluid/stranding and prominent mesenteric lymph nodes. No pneumatosis, no pneumoperitoneum, no drainable fluid collection. There is a linear band of fluid/inflammation extending from terminal ileum to an adjacent of small bowel with some tethered appearance of the bowel possibly reflecting a fistula on image 56/3. Vascular/Lymphatic: Normal caliber abdominal aorta. Prominent right lower quadrant lymph nodes measuring up to 9 mm Reproductive: Focus of hyperenhancement in the uterine fundus is similar prior and possibly reflects a small uterine leiomyoma. No suspicious adnexal mass. Other: Trace pelvic free fluid is greater on the right and likely reflects a combination inflammatory fluid from the process in the terminal ileum and physiologic free fluid. No perianal disease identified. Musculoskeletal: No acute osseous abnormality. IMPRESSION: 1. Similar prominent inflammation of the distal/terminal ileum extending through the cecum with adjacent inflammatory fluid/stranding and prominent mesenteric lymph nodes, consistent with terminal ileitis. Primary differential considerations in this patient given appearance on prior CT and persistence today is inflammatory bowel disease/Crohn's with less likely differential consideration of infectious bowel disease. 2. Linear band of fluid/inflammation extending from the terminal ileum to an adjacent loop of small bowel with a slight tethered appearance of the bowel may reflect a fistula. 3.  No  pneumoperitoneum and no drainable fluid collection. Electronically Signed   By: Maudry Mayhew M.D.   On: 01/08/2022 13:22    Review of Systems  Constitutional: Negative.   HENT: Negative.    Eyes: Negative.   Respiratory: Negative.    Cardiovascular: Negative.   Gastrointestinal:  Positive for abdominal pain. Negative for abdominal distention, anal bleeding, blood in stool, constipation, diarrhea, nausea, rectal pain and vomiting.  Endocrine: Negative.   Genitourinary: Negative.   Musculoskeletal: Negative.   Skin: Negative.   Neurological:  Negative for tremors and headaches.  Hematological:  Negative for adenopathy. Does not bruise/bleed easily.  Blood pressure (!) 97/58, pulse 92, temperature 98.9 F (37.2 C), temperature source Oral, resp. rate 18, SpO2 95 %. Physical Exam Constitutional:      General: She is not in acute distress.    Appearance: She is well-developed. She is not ill-appearing.  HENT:     Head: Normocephalic and atraumatic.     Mouth/Throat:     Mouth: Mucous membranes are moist.     Pharynx: Oropharynx is clear.  Eyes:     Extraocular Movements: Extraocular movements intact.     Pupils: Pupils are equal, round, and reactive to light.  Cardiovascular:     Rate and Rhythm: Normal rate and regular rhythm.     Heart sounds: Normal heart sounds.  Pulmonary:     Effort: Pulmonary effort is normal.     Breath sounds: Normal breath sounds.  Abdominal:     General: Bowel sounds are decreased.     Palpations: Abdomen is soft.     Tenderness: There is abdominal tenderness.  Skin:    General: Skin is warm and dry.  Neurological:     General: No  focal deficit present.     Mental Status: She is alert and oriented to person, place, and time.   Assessment/Plan: Terminal ileitis on CT scan with hyperenhancement of the mucosa extending to the IC valve-the present presentation seems somewhat atypical for Crohn's disease but the patient did not respond well to Cipro  and Flagyl and therefore I think would be best to treat her with IV steroids and check a baseline CRP. A s we need tissue diagnosis patient is being prepped for a colonoscopy tomorrow.  Sandra Foster 01/08/2022, 5:28 PM

## 2022-01-08 NOTE — H&P (Addendum)
Family Medicine Teaching Service ?Hospital Admission History and Physical ?Service Pager: [phone] ? ?Patient name: Sandra Foster Medical record number: [id number] ?Date of birth: [birth year] Age: 31 y.o. Gender: female ? ?Primary Care Provider: Patient, No Pcp Per (Inactive) ?Consultants: GI, general surgery ?Code Status: Full  ?Preferred Emergency Contact: Totiana Everson (aunt) [phone] ? ?Chief Complaint: RLQ Abdominal pain ? ?Assessment and Plan: ?Sandra Foster is a 31 y.o. female presenting with RLQ abdominal pain . 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 ?Right Lower Quadrant Pain    Diarrhea ?Patient complains of colicky RLQ pain. Seen in ED 3 weeks ago for identical symptoms but since has progressed in frequency. Was diagnosed with terminal ileitis and d/c home on Flagyl and cipro 3 weeks ago. Reports completing the course and did not help with abdominal pain. Has associated diarrhea. Was referred to GI but never followed up. Denies family hx of autoimmune disease including Crohn's or of GI disease. Negative pregnancy test. Has some leukocytosis, WBC 14.6 (12/15/21 12.6). Lipase wnl, beta HCG wnl, UA unremarkable, K 3.4, Calcium 8.5 (corrects to 9.0), normal AST/ALT. CT ab/pelvis similar to one performed 3 weeks ago showing terminal ileitis and possible fistula between terminal ileum and adjacent loop of small bowel. BP 95/54, but otherwise VSS. EDP consulted GI and general surgery. Percocet x1 has helped with pain. Differential include: inflammatory bowel disease, irritable bowel syndrome, infiltrative bowel disease, infectious colitis.  ?-Avoid NSAIDs ?-GI consulted, greatly appreciate recs ?-Surgery consulted, greatly appreciate recs ?-Obtain ESR, CRP  ?-Consider iron, b12, folate, vitamin D ?-Consider fecal calprotectin ?-Consider prednisone if autoimmune etiology ?-Pain control: oxycodone q6 hr, tylenol q6  hr ? ?Hypokalemia  ?Mild hypokalemia K 3.4.   ?- Monitor for now, if low in AM replete  ? ?FEN/GI: regular ?Prophylaxis: Lovenox ? ?Disposition:  ? ?History of Present Illness:  Sandra Foster is a 31 y.o. female presenting with right lower quadrant colicky abdominal pain.  Patient was seen in the urgent care on 12/15/2021 for similar complaints and sent to the ED for appendicitis rule out.  At that time, patient was thought to have terminal ileitis evidenced by CT abdomen/pelvis.  GI initially was consulted that visit and recommended outpatient colonoscopy as well as empirically treating with Flagyl and ciprofloxacin.  Patient did not follow-up outpatient and returns to the ED today with identical complaints with the addition of diarrhea. Diarrhea did briefly help with abdominal pain. Abdominal pain worsens with deep breath, straining, moving. Tylenol and Benadryl did not help with the pain. ? ?GI was again consulted this hospitalization and recommended admission for work-up of possible Crohn's disease. Denies family history of autoimmune disease.  ? ?Patient denies weight loss, GU changes, or shortness of breath. Does endorse early satiety but tolerating fluid intake.  ? ? ?Review Of Systems: Per HPI with the following additions:  ? ?Review of Systems  ?Constitutional:  Positive for appetite change. Negative for chills and fever.  ?HENT:  Negative for congestion and sinus pressure.   ?Respiratory:  Negative for cough, chest tightness and shortness of breath.   ?Cardiovascular:  Negative for chest pain and leg swelling.  ?Gastrointestinal:  Positive for abdominal pain and diarrhea. Negative for constipation, nausea, rectal pain and vomiting.  ?Genitourinary:  Negative for dysuria and frequency.  ?Musculoskeletal:  Positive for back pain.  ?Neurological:  Positive for headaches. Negative for dizziness, seizures, syncope, weakness and light-headedness.   ? ?Patient  Active Problem List  ? Diagnosis Date Noted  ?  Pregnancy 09/13/2015  ? NVD (normal vaginal delivery) 09/13/2015  ? Normal labor and delivery 09/12/2015  ? Diabetes in undelivered pregnancy 08/30/2015  ? 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 [redacted] weeks gestation of pregnancy   ? Prior pregnancy with fetal demise and current pregnancy in second trimester   ? Encounter for fetal anatomic survey   ? ? ?Past Medical History: ?Past Medical History:  ?Diagnosis Date  ? Chlamydia   ? Fetal demise   ? hyperglycemic hyperosmolar nonketonic syndrome  ? Gestational diabetes   ? Hernia   ? Pregnancy induced hypertension   ? Pyelonephritis   ? ? ?Past Surgical History: ?Past Surgical History:  ?Procedure Laterality Date  ? HERNIA REPAIR    ? ? ?Social History: ?Social History  ? ?Tobacco Use  ? Smoking status: Former  ?  Packs/day: 0.25  ?  Types: Cigarettes  ? Smokeless tobacco: Former  ?  Quit date: 06/10/2011  ?Substance Use Topics  ? Alcohol use: No  ? Drug use: No  ? ?UDS positive for THC. ? ?Additional social history:   ?Please also refer to relevant sections of EMR. ? ?Family History: ?Family History  ?Problem Relation Age of Onset  ? Hypertension Maternal Grandmother   ? ?No Fhx of autoimmune disease that she knows of.  ? ?Allergies and Medications: ?No Known Allergies ?No current facility-administered medications on file prior to encounter.  ? ?Current Outpatient Medications on File Prior to Encounter  ?Medication Sig Dispense Refill  ? etonogestrel (NEXPLANON) 68 MG IMPL implant 1 each by Subdermal route once.    ? [DISCONTINUED] amitriptyline (ELAVIL) 25 MG tablet Take 1-2 tablets (25-50 mg total) by mouth at bedtime as needed (headache). 60 tablet 1  ? ? ?Objective: ?BP 110/75   Pulse 90   Temp 98.9 ?F (37.2 ?C) (Oral)   Resp 12   SpO2 100%  ?Exam: ?Physical Exam ?Constitutional:   ?   Appearance: She is well-developed and normal weight.  ?HENT:  ?   Head: Normocephalic and atraumatic.  ?   Nose: Nose normal. No rhinorrhea.  ?   Mouth/Throat:  ?    Mouth: Mucous membranes are moist.  ?   Pharynx: Oropharynx is clear.  ?Eyes:  ?   General: No scleral icterus. ?   Extraocular Movements: Extraocular movements intact.  ?Cardiovascular:  ?   Rate and Rhythm: Normal rate and regular rhythm.  ?   Heart sounds: Normal heart sounds.  ?Pulmonary:  ?   Effort: Pulmonary effort is normal.  ?   Breath sounds: Normal breath sounds.  ?Abdominal:  ?   General: Bowel sounds are increased. There is distension.  ?   Palpations: Abdomen is soft. There is no hepatomegaly or mass.  ?   Tenderness: There is abdominal tenderness in the right lower quadrant and left lower quadrant. There is rebound. Negative signs include Murphy's sign.  ?Musculoskeletal:  ?   Cervical back: Normal range of motion and neck supple.  ?   Right lower leg: No edema.  ?   Left lower leg: No edema.  ?Skin: ?   General: Skin is warm and dry.  ?   Capillary Refill: Capillary refill takes less than 2 seconds.  ?Neurological:  ?   General: No focal deficit present.  ?   Mental Status: She is alert and oriented to person, place, and time.  ?Psychiatric:     ?  Mood and Affect: Mood normal.     ?   Behavior: Behavior normal.  ? ? ?Labs and Imaging: ?CBC BMET  ?Recent Labs  ?Lab 01/08/22 ?1120  ?WBC 14.6*  ?HGB 12.1  ?HCT 36.9  ?PLT 300  ? Recent Labs  ?Lab 01/08/22 ?1120  ?NA 137  ?K 3.4*  ?CL 105  ?CO2 25  ?BUN 9  ?CREATININE 0.72  ?GLUCOSE 107*  ?CALCIUM 8.5*  ?  ? ? ? ?France Ravens, MD ?01/08/2022, 2:58 PM ?PGY-1, [place] ?[place] Intern pager: [phone], text pages welcome ? ? ?FPTS Upper-Level Resident Addendum ?  ?I have independently interviewed and examined the patient. I have discussed the above with the original author and agree with their documentation. My edits for correction/addition/clarification are in within the document. Please see also any attending notes.  ? ?Lyndee Hensen, DO ?PGY-3, [facility] ?01/08/2022 5:27 PM  ?[place] Service pager: [phone] (text pages  welcome through [facility]) ? ? ? ? ?

## 2022-01-09 ENCOUNTER — Encounter (HOSPITAL_COMMUNITY)
Admission: EM | Disposition: A | Discharge status: Home / Self Care | Payer: Self-pay | Source: Home / Self Care | Attending: Family Medicine

## 2022-01-09 ENCOUNTER — Inpatient Hospital Stay (HOSPITAL_COMMUNITY): Payer: Medicaid Other | Admitting: Anesthesiology

## 2022-01-09 ENCOUNTER — Encounter (HOSPITAL_COMMUNITY): Payer: Self-pay | Admitting: Student

## 2022-01-09 DIAGNOSIS — E860 Dehydration: Secondary | ICD-10-CM

## 2022-01-09 DIAGNOSIS — K632 Fistula of intestine: Secondary | ICD-10-CM | POA: Diagnosis present

## 2022-01-09 DIAGNOSIS — K5000 Crohn's disease of small intestine without complications: Secondary | ICD-10-CM

## 2022-01-09 DIAGNOSIS — R1031 Right lower quadrant pain: Secondary | ICD-10-CM

## 2022-01-09 LAB — BASIC METABOLIC PANEL
Anion gap: 8 (ref 5–15)
BUN: <5 mg/dL — ABNORMAL LOW (ref 6–20)
CO2: 25 mmol/L (ref 22–32)
Calcium: 8.6 mg/dL — ABNORMAL LOW (ref 8.9–10.3)
Chloride: 104 mmol/L (ref 98–111)
Creatinine, Ser: 0.82 mg/dL (ref 0.44–1.00)
GFR, Estimated: >60 mL/min (ref >60)
Glucose, Bld: 103 mg/dL — ABNORMAL HIGH (ref 70–99)
Potassium: 3.3 mmol/L — ABNORMAL LOW (ref 3.5–5.1)
Sodium: 137 mmol/L (ref 135–145)

## 2022-01-09 LAB — CBC
HCT: 33 % — ABNORMAL LOW (ref 36.0–46.0)
Hemoglobin: 11.3 g/dL — ABNORMAL LOW (ref 12.0–15.0)
MCH: 29.3 pg (ref 26.0–34.0)
MCHC: 34.2 g/dL (ref 30.0–36.0)
MCV: 85.5 fL (ref 80.0–100.0)
Platelets: 273 10*3/uL (ref 150–400)
RBC: 3.86 MIL/uL — ABNORMAL LOW (ref 3.87–5.11)
RDW: 13 % (ref 11.5–15.5)
WBC: 16.2 10*3/uL — ABNORMAL HIGH (ref 4.0–10.5)
nRBC: 0 % (ref 0.0–0.2)

## 2022-01-09 LAB — HIV ANTIBODY (ROUTINE TESTING W REFLEX): HIV Screen 4th Generation wRfx: NONREACTIVE

## 2022-01-09 LAB — C-REACTIVE PROTEIN: CRP: 12 mg/dL — ABNORMAL HIGH (ref <1.0)

## 2022-01-09 SURGERY — COLONOSCOPY WITH PROPOFOL
Anesthesia: Monitor Anesthesia Care

## 2022-01-09 MED ORDER — OXYCODONE HCL 5 MG PO TABS
5.0000 mg | ORAL_TABLET | ORAL | Status: AC
  Administered 2022-01-09: 5 mg via ORAL
  Filled 2022-01-09: qty 1

## 2022-01-09 MED ORDER — PROPOFOL 500 MG/50ML IV EMUL
INTRAVENOUS | Status: DC | PRN
  Administered 2022-01-09: 150 ug/kg/min via INTRAVENOUS

## 2022-01-09 MED ORDER — PROPOFOL 10 MG/ML IV BOLUS
INTRAVENOUS | Status: DC | PRN
  Administered 2022-01-09 (×2): 20 mg via INTRAVENOUS
  Administered 2022-01-09: 30 mg via INTRAVENOUS
  Administered 2022-01-09: 20 mg via INTRAVENOUS

## 2022-01-09 MED ORDER — POTASSIUM CHLORIDE 10 MEQ/100ML IV SOLN
10.0000 meq | INTRAVENOUS | Status: AC
  Administered 2022-01-09 (×4): 10 meq via INTRAVENOUS
  Filled 2022-01-09 (×4): qty 100

## 2022-01-09 MED ORDER — METHYLPREDNISOLONE SODIUM SUCC 125 MG IJ SOLR
40.0000 mg | Freq: Two times a day (BID) | INTRAMUSCULAR | Status: DC
  Administered 2022-01-09 – 2022-01-10 (×2): 40 mg via INTRAVENOUS
  Filled 2022-01-09 (×2): qty 2

## 2022-01-09 MED ORDER — CYANOCOBALAMIN 1000 MCG/ML IJ SOLN
1000.0000 ug | Freq: Once | INTRAMUSCULAR | Status: AC
  Administered 2022-01-09: 1000 ug via INTRAMUSCULAR
  Filled 2022-01-09: qty 1

## 2022-01-09 MED ORDER — METHYLPREDNISOLONE SODIUM SUCC 40 MG IJ SOLR
40.0000 mg | Freq: Once | INTRAMUSCULAR | Status: AC
  Administered 2022-01-09: 40 mg via INTRAVENOUS
  Filled 2022-01-09: qty 1

## 2022-01-09 MED ORDER — SODIUM CHLORIDE 0.9 % IV SOLN: INTRAVENOUS | Status: DC

## 2022-01-09 SURGICAL SUPPLY — 22 items

## 2022-01-09 NOTE — Op Note (Signed)
[facility] ?Patient Name: Sandra Foster ?Procedure Date : 01/09/2022 ?MRN: [id number] ?Attending MD: Carol Everette , MD ?Date of Birth: 10-27-1991 ?CSN: [id number] ?Age: 31 ?Admit Type: Inpatient ?Procedure:                Colonoscopy ?Indications:              Abnormal CT of the GI tract ?Providers:                Carol Jamile, MD, Jaci Carrel, RN, Elberta Fortis  ?                          Johnella Moloney, Technician ?Referring MD:              ?Medicines:                Propofol per Anesthesia ?Complications:            No immediate complications. ?Estimated Blood Loss:     Estimated blood loss was minimal. ?Procedure:                Pre-Anesthesia Assessment: ?                          - Prior to the procedure, a History and Physical  ?                          was performed, and patient medications and  ?                          allergies were reviewed. The patient's tolerance of  ?                          previous anesthesia was also reviewed. The risks  ?                          and benefits of the procedure and the sedation  ?                          options and risks were discussed with the patient.  ?                          All questions were answered, and informed consent  ?                          was obtained. Prior Anticoagulants: The patient has  ?                          taken no previous anticoagulant or antiplatelet  ?                          agents. ASA Grade Assessment: II - A patient with  ?                          mild systemic disease. After reviewing the risks  ?  and benefits, the patient was deemed in  ?                          satisfactory condition to undergo the procedure. ?                          - Sedation was administered by an anesthesia  ?                          professional. Deep sedation was attained. ?                          After obtaining informed consent, the colonoscope  ?                          was passed under direct vision.  Throughout the  ?                          procedure, the patient's blood pressure, pulse, and  ?                          oxygen saturations were monitored continuously. The  ?                          [id number]) Olympus peds colonoscope was  ?                          introduced through the anus and advanced to the the  ?                          cecum, identified by appendiceal orifice and  ?                          ileocecal valve. The colonoscopy was performed  ?                          without difficulty. The patient tolerated the  ?                          procedure well. The quality of the bowel  ?                          preparation was evaluated using the BBPS [facility]  ?                          Bowel Preparation Scale) with scores of: Right  ?                          Colon = 2 (minor amount of residual staining, small  ?                          fragments of stool and/or opaque liquid, but mucosa  ?  seen well), Transverse Colon = 2 (minor amount of  ?                          residual staining, small fragments of stool and/or  ?                          opaque liquid, but mucosa seen well) and Left Colon  ?                          = 2 (minor amount of residual staining, small  ?                          fragments of stool and/or opaque liquid, but mucosa  ?                          seen well). The total BBPS score equals 6. The  ?                          quality of the bowel preparation was good. ?Scope In: 4:49:27 PM ?Scope Out: 5:08:27 PM ?Scope Withdrawal Time: 0 hours 15 minutes 57 seconds  ?Total Procedure Duration: 0 hours 19 minutes 0 seconds  ?Findings: ?     A diffuse area of mucosa at the ileocecal valve was severely congested,  ?     erythematous, friable (with contact bleeding) and granular. Biopsies  ?     were taken with a cold forceps for histology. ?     The entire examined colon appeared normal. ?     The ICV/terminal ileum was abnormal. It was  friable and edematous.  ?     Multiple attempts to intubate the TI failed as the area was too  ?     stenosed. Multiple cold biopsis were obtained in this region. ?Impression:               - Congested, erythematous, friable (with contact  ?                          bleeding) and granular mucosa at the ileocecal  ?                          valve. Biopsied. ?                          - The entire examined colon is normal. ?Recommendation:           - Return patient to hospital ward for ongoing care. ?                          - Clear liquid diet. ?                          - Continue present medications. ?                          - Await pathology results. ?                          -  Solumedrol 40 mg Q12 hours. ?Procedure Code(s):        --- Professional --- ?                          [phone], Colonoscopy, flexible; with biopsy, single  ?                          or multiple ?Diagnosis Code(s):        --- Professional --- ?                          R93.3, Abnormal findings on diagnostic imaging of  ?                          other parts of digestive tract ?                          K92.2, Gastrointestinal hemorrhage, unspecified ?                          K63.89, Other specified diseases of intestine ?CPT copyright 2019 American Medical Association. All rights reserved. ?The codes documented in this report are preliminary and upon coder review may  ?be revised to meet current compliance requirements. ?Carol Dulcinea, MD ?Carol Aidel, MD ?01/09/2022 5:16:56 PM ?This report has been signed electronically. ?Number of Addenda: 0 ?

## 2022-01-09 NOTE — Interval H&P Note (Signed)
History and Physical Interval Note: ? ?01/09/2022 ?3:53 PM ? ?Sandra Foster  has presented today for surgery, with the diagnosis of terminal ileitis.  The various methods of treatment have been discussed with the patient and family. After consideration of risks, benefits and other options for treatment, the patient has consented to  Procedure(s): ?COLONOSCOPY WITH PROPOFOL (N/A) as a surgical intervention.  The patient's history has been reviewed, patient examined, no change in status, stable for surgery.  I have reviewed the patient's chart and labs.  Questions were answered to the patient's satisfaction.   ? ? ?Halah Whiteside D ? ? ?

## 2022-01-09 NOTE — Progress Notes (Addendum)
Family Medicine Teaching Service ?Daily Progress Note ?Intern Pager: [phone] ? ?Patient name: Sandra Foster Medical record number: [id number] ?Date of birth: 01-27-1991 Age: 31 y.o. Gender: female ? ?Primary Care Provider: Patient, No Pcp Per (Inactive) ?Consultants: GI ?Code Status: Full ? ?Pt Overview and Major Events to Date:  ?01/08/22-admitted  ?01/09/22-colonoscopy ? ?Assessment and Plan: ?Sandra Foster is a 31 y.o. female presenting with RLQ abdominal pain . 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 ?RLQ pain  Diarrhea ?R/O inflammatory bowel disease ?Patient reports RLQ pain is well controlled today. Was seen finishing her bowel prep for colonoscopy. CRP 12, ESR  28. HIV negative. WBC 14.6>16.2. K 3.3, repleting with 40 meq total of IV klorcon. Lipase wnl, UA negative. ?-Colonoscopy today ?-Received IV solumedrol 40 mg x1. Will defer to GI for further IV steroids.  ? ?  ?Hypokalemia  ?Mild hypokalemia K 3.3.   ?-Repleted with 40 meq of klorcon. ?-Recheck BMP tomorrow morning. ? ?FEN/GI: NPO until colonoscopy ?PPx: Lovenox ?Dispo:Home in 2-3 days. Barriers include ongoing medical treatment.  ? ?Subjective:  ?Patient seen and assessed at bedside.  Patient reports abdominal pain is well controlled with pain meds.  Patient understands plan for colonoscopy today and drank all of her bowel prep. No complaints at this time. ? ?Objective: ?Temp:  [98.6 ?F (37 ?C)-99.1 ?F (37.3 ?C)] 98.9 ?F (37.2 ?C) (03/14 0746) ?Pulse Rate:  [83-95] 83 (03/14 0746) ?Resp:  [12-19] 18 (03/14 0746) ?BP: (97-115)/(58-75) 104/62 (03/14 0746) ?SpO2:  [95 %-100 %] 95 % (03/14 0746) ?Weight:  [154 lb 5.2 oz (70 kg)] 154 lb 5.2 oz (70 kg) (03/13 1945) ?Physical Exam: ?General: No acute distress. Resting comfortably  ?Cardiovascular: RRR ?Respiratory: CTAB ?Abdomen: soft, right sided abdominal pain to palpation ?Extremities: no edema. Able to move all  extremities ? ?Laboratory: ?Recent Labs  ?Lab 01/08/22 ?1120 01/09/22 ?4431  ?WBC 14.6* 16.2*  ?HGB 12.1 11.3*  ?HCT 36.9 33.0*  ?PLT 300 273  ? ?Recent Labs  ?Lab 01/08/22 ?1120 01/09/22 ?5400  ?NA 137 137  ?K 3.4* 3.3*  ?CL 105 104  ?CO2 25 25  ?BUN 9 <5*  ?CREATININE 0.72 0.82  ?CALCIUM 8.5* 8.6*  ?PROT 6.5  --   ?BILITOT 0.5  --   ?ALKPHOS 50  --   ?ALT 15  --   ?AST 12*  --   ?GLUCOSE 107* 103*  ? ? ? ? ?Imaging/Diagnostic Tests: ?No new ? ?France Ravens, MD ?01/09/2022, 1:06 PM ?PGY-1, [place] ?[place] Intern pager: [phone], text pages welcome ?

## 2022-01-09 NOTE — Transfer of Care (Signed)
Immediate Anesthesia Transfer of Care Note ? ?Patient: Sandra Foster ? ?Procedure(s) Performed: COLONOSCOPY WITH PROPOFOL ?BIOPSY ? ?Patient Location: PACU ? ?Anesthesia Type:MAC ? ?Level of Consciousness: drowsy and patient cooperative ? ?Airway & Oxygen Therapy: Patient Spontanous Breathing and Patient connected to nasal cannula oxygen ? ?Post-op Assessment: Report given to RN, Post -op Vital signs reviewed and stable and Patient moving all extremities ? ?Post vital signs: Reviewed and stable ? ?Last Vitals:  ?Vitals Value Taken Time  ?BP 99/63 01/09/22 1717  ?Temp    ?Pulse 58 01/09/22 1718  ?Resp 19 01/09/22 1718  ?SpO2 99 % 01/09/22 1718  ?Vitals shown include unvalidated device data. ? ?Last Pain:  ?Vitals:  ? 01/09/22 1513  ?TempSrc: Temporal  ?PainSc: 0-No pain  ?   ? ?  ? ?Complications: No notable events documented. ?

## 2022-01-09 NOTE — Progress Notes (Signed)
Continues on bowel prep. Has been npo since arrival to unit except for bowel prep.Reports that she is almost clear. Pt informed to let nurse know once clear for confirmation. ?

## 2022-01-09 NOTE — Anesthesia Preprocedure Evaluation (Addendum)
Anesthesia Evaluation  ?Patient identified by MRN, date of birth, ID band ?Patient awake ? ? ? ?Reviewed: ?Allergy & Precautions, NPO status , Patient's Chart, lab work & pertinent test results ? ?Airway ?Mallampati: I ? ?TM Distance: >3 FB ?Neck ROM: Full ? ? ? Dental ?no notable dental hx. ?(+) Teeth Intact, Dental Advisory Given ?  ?Pulmonary ?neg pulmonary ROS, Patient abstained from smoking., former smoker,  ?  ?Pulmonary exam normal ?breath sounds clear to auscultation ? ? ? ? ? ? Cardiovascular ?negative cardio ROS ?Normal cardiovascular exam ?Rhythm:Regular Rate:Normal ? ? ?  ?Neuro/Psych ?negative neurological ROS ? negative psych ROS  ? GI/Hepatic ?negative GI ROS, Neg liver ROS,   ?Endo/Other  ? ? Renal/GU ?negative Renal ROS  ?negative genitourinary ?  ?Musculoskeletal ?negative musculoskeletal ROS ?(+)  ? Abdominal ?  ?Peds ? 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 Reproductive/Obstetrics ? ?  ? ? ? ? ? ? ? ? ? ? ? ? ? ?  ?  ? ? ? ? ? ? ? ?Anesthesia Physical ?Anesthesia Plan ? ?ASA: 2 ? ?Anesthesia Plan: MAC  ? ?Post-op Pain Management:   ? ?Induction: Intravenous ? ?PONV Risk Score and Plan: 2 and Propofol infusion and Treatment may vary due to age or medical condition ? ?Airway Management Planned: Natural Airway ? ?Additional Equipment:  ? ?Intra-op Plan:  ? ?Post-operative Plan:  ? ?Informed Consent: I have reviewed the patients History and Physical, chart, labs and discussed the procedure including the risks, benefits and alternatives for the proposed anesthesia with the patient or authorized representative who has indicated his/her understanding and acceptance.  ? ? ? ?Dental advisory given ? ?Plan Discussed with: CRNA ? ?Anesthesia Plan Comments:   ? ? ? ? ? ?Anesthesia Quick Evaluation ? ?

## 2022-01-09 NOTE — Progress Notes (Signed)
FPTS Interim Night Progress Note ? ?S:Patient sleeping comfortably.  Rounded with primary night RN. Reports patient had requested medication for depressed mood earlier.  No other concerns voiced.  No orders required.   ? ?O: ?Today's Vitals  ? 01/09/22 1732 01/09/22 1747 01/09/22 1800 01/09/22 2033  ?BP: 100/65 101/63 103/65 110/65  ?Pulse: 60 63 63 69  ?Resp: 20   20  ?Temp:  98.2 ?F (36.8 ?C) 98.2 ?F (36.8 ?C) 98.2 ?F (36.8 ?C)  ?TempSrc:   Oral Oral  ?SpO2: 100% 100% 100% 100%  ?Weight:      ?Height:      ?PainSc:  0-No pain    ? ? ? ? ?A/P: ?Continue current management ?Day team to follow up to evaluate depressed mood ? ?Dana Allan MD ?PGY-3, [facility] ?Service pager [phone]   ?

## 2022-01-10 ENCOUNTER — Encounter (HOSPITAL_COMMUNITY): Payer: Self-pay | Admitting: Gastroenterology

## 2022-01-10 LAB — CBC
HCT: 35.5 % — ABNORMAL LOW (ref 36.0–46.0)
Hemoglobin: 12.2 g/dL (ref 12.0–15.0)
MCH: 29.3 pg (ref 26.0–34.0)
MCHC: 34.4 g/dL (ref 30.0–36.0)
MCV: 85.3 fL (ref 80.0–100.0)
Platelets: 339 10*3/uL (ref 150–400)
RBC: 4.16 MIL/uL (ref 3.87–5.11)
RDW: 12.7 % (ref 11.5–15.5)
WBC: 16.7 10*3/uL — ABNORMAL HIGH (ref 4.0–10.5)
nRBC: 0 % (ref 0.0–0.2)

## 2022-01-10 LAB — BASIC METABOLIC PANEL
Anion gap: 11 (ref 5–15)
BUN: 5 mg/dL — ABNORMAL LOW (ref 6–20)
CO2: 21 mmol/L — ABNORMAL LOW (ref 22–32)
Calcium: 9.2 mg/dL (ref 8.9–10.3)
Chloride: 103 mmol/L (ref 98–111)
Creatinine, Ser: 0.55 mg/dL (ref 0.44–1.00)
GFR, Estimated: >60 mL/min (ref >60)
Glucose, Bld: 171 mg/dL — ABNORMAL HIGH (ref 70–99)
Potassium: 4 mmol/L (ref 3.5–5.1)
Sodium: 135 mmol/L (ref 135–145)

## 2022-01-10 LAB — GLUCOSE, CAPILLARY: Glucose-Capillary: 189 mg/dL — ABNORMAL HIGH (ref 70–99)

## 2022-01-10 LAB — C-REACTIVE PROTEIN: CRP: 13.9 mg/dL — ABNORMAL HIGH (ref <1.0)

## 2022-01-10 MED ORDER — ONDANSETRON HCL 4 MG/2ML IJ SOLN
4.0000 mg | Freq: Four times a day (QID) | INTRAMUSCULAR | Status: DC | PRN
  Administered 2022-01-10: 4 mg via INTRAVENOUS
  Filled 2022-01-10: qty 2

## 2022-01-10 MED ORDER — PREDNISONE 50 MG PO TABS: 50.0000 mg | ORAL_TABLET | Freq: Every day | ORAL | Status: DC

## 2022-01-10 MED ORDER — SERTRALINE HCL 50 MG PO TABS
25.0000 mg | ORAL_TABLET | Freq: Every day | ORAL | Status: DC
  Administered 2022-01-10 – 2022-01-13 (×4): 25 mg via ORAL
  Filled 2022-01-10 (×4): qty 1

## 2022-01-10 MED ORDER — PREDNISONE 50 MG PO TABS: 50.0000 mg | ORAL_TABLET | Freq: Two times a day (BID) | ORAL | Status: DC

## 2022-01-10 MED ORDER — METHYLPREDNISOLONE SODIUM SUCC 125 MG IJ SOLR
80.0000 mg | INTRAMUSCULAR | Status: DC
  Administered 2022-01-10 – 2022-01-12 (×3): 80 mg via INTRAVENOUS
  Filled 2022-01-10 (×3): qty 2

## 2022-01-10 NOTE — Anesthesia Postprocedure Evaluation (Signed)
Anesthesia Post Note ? ?Patient: Sandra Foster ? ?Procedure(s) Performed: COLONOSCOPY WITH PROPOFOL ?BIOPSY ? ?  ? ?Patient location during evaluation: PACU ?Anesthesia Type: MAC ?Level of consciousness: awake and alert ?Pain management: pain level controlled ?Vital Signs Assessment: post-procedure vital signs reviewed and stable ?Respiratory status: spontaneous breathing, nonlabored ventilation, respiratory function stable and patient connected to nasal cannula oxygen ?Cardiovascular status: stable and blood pressure returned to baseline ?Postop Assessment: no apparent nausea or vomiting ?Anesthetic complications: no ? ? ?No notable events documented. ? ?Last Vitals:  ?Vitals:  ? 01/10/22 0504 01/10/22 0722  ?BP: 105/63 103/62  ?Pulse: 72 77  ?Resp:  16  ?Temp: 36.6 ?C 36.6 ?C  ?SpO2: 99% 100%  ?  ?Last Pain:  ?Vitals:  ? 01/10/22 0722  ?TempSrc: Oral  ?PainSc:   ? ? ?  ?  ?  ?  ?  ?  ? ?[name] ? ? ? ? ?

## 2022-01-10 NOTE — Progress Notes (Signed)
Family Medicine Teaching Service ?Daily Progress Note ?Intern Pager: [phone] ? ?Patient name: Sandra Foster Medical record number: [id number] ?Date of birth: Sep 28, 1991 Age: 31 y.o. Gender: female ? ?Primary Care Provider: Patient, No Pcp Per (Inactive) ?Consultants: GI ?Code Status: Full ? ?Pt Overview and Major Events to Date:  ?01/08/22-admitted ?01/09/22-colonoscopy ? ?Assessment and Plan: ?CONSEPCION UTT is a 31 y.o. female presenting with RLQ abdominal pain . PMH is significant for visiting ED 2/17 for similar complaints, gestational diabetes, hernia s/p repair, on nexplanon ? ?RLQ pain  Diarrhea ?R/O inflammatory bowel disease ?CRP 12.0 > 13.9, 16.2>16.7. VSS. Reports some improvement of ab pain. Colonoscopy shows congested, erythematous, friable (with contact bleeding) and granular mucosa at the ?ileocecal valve with significant stenosis. Relevant areas were biopsied and GI recommended continuing IV solumedrol 40 mg bid until at least Saturday. ?-IV Solumedrol 40 mg bid until Saturday ?-Pain well controlled with oxy IR prn ?-Thin liquid diet ? ?Hx of Gestational Diabetes ?Repeat A1c pending.  Given patient will be on high doses of steroids, patient will likely have hyperglycemia.  Currently CBG is around 120s but with liquid diet ?-Consider glipizide 5 mg daily based on A1c ? ?Depression ?Patient reports ongoing stressors including working 2 jobs, taking care of 36-year-old child, anxiety, and new stressor of possible Crohn's diagnosis. Agreeable to trial of Zoloft. R/b/se discussed with patient. ?-Zoloft 25 mg qd ? ?Hypokalemia, resolved ?Potassium 3.3>4.0 ?-Continue to monitor ? ?FEN/GI: Liquid ?PPx: Lovenox ?Dispo:Home today. Barriers include ongoing medical treatment.  ? ?Subjective:   ?Patient seen and assessed at bedside.  Boyfriend in room.  Patient reports her abdominal pain has somewhat improved since yesterday.  Patient denies any new complaints at this time.  Discussed with patient that she would  need to stay for IV Solu-Medrol for the next several days due to the severity of patient's stenosis that was seen on colonoscopy.  Patient verbalized agreement to plan.  Patient also endorses depression and anxiety which has been persistent for several weeks now due to stressors of working 2 jobs and taking care of child as well as new stressor of possible Crohn's diagnosis.  Patient requesting antidepressant which was initiated today.  All questions were addressed at this time. ? ? ?Objective: ?Temp:  [97.1 ?F (36.2 ?C)-98.9 ?F (37.2 ?C)] 97.9 ?F (36.6 ?C) (03/15 1478) ?Pulse Rate:  [57-83] 77 (03/15 0722) ?Resp:  [16-20] 16 (03/15 0722) ?BP: (96-110)/(57-65) 103/62 (03/15 2956) ?SpO2:  [95 %-100 %] 100 % (03/15 0722) ?Weight:  [70 kg] 70 kg (03/14 1513) ?Physical Exam: ?General: No acute distress, resting comfortably in bed ?Cardiovascular: RRR ?Respiratory: CTA B ?Abdomen: Soft, still tender on palpation on right side of abdomen but less so (5 out of 10) ?Extremities: No edema, able to move all extremities ? ?Laboratory: ?Recent Labs  ?Lab 01/08/22 ?1120 01/09/22 ?2130 01/10/22 ?0131  ?WBC 14.6* 16.2* 16.7*  ?HGB 12.1 11.3* 12.2  ?HCT 36.9 33.0* 35.5*  ?PLT 300 273 339  ? ?Recent Labs  ?Lab 01/08/22 ?1120 01/09/22 ?8657 01/10/22 ?0131  ?NA 137 137 135  ?K 3.4* 3.3* 4.0  ?CL 105 104 103  ?CO2 25 25 21*  ?BUN 9 <5* 5*  ?CREATININE 0.72 0.82 0.55  ?CALCIUM 8.5* 8.6* 9.2  ?PROT 6.5  --   --   ?BILITOT 0.5  --   --   ?ALKPHOS 50  --   --   ?ALT 15  --   --   ?AST 12*  --   --   ?  GLUCOSE 107* 103* 171*  ? ? ? ? ?Imaging/Diagnostic Tests: ?Procedure: Colonoscopy ?Indications: Abnormal CT of the GI tract ?Providers: Jeani Hawking, MD, Lorenza Evangelist, RN, Rozetta Nunnery, Technician ?Findings ?The ICV/terminal ileum was abnormal. It was friable and edematous. Multiple attempts to intubate the ?TI failed as the area was too stenosed. Multiple cold biopsis were obtained in this region. ?Impression ?- Congested,  erythematous, friable (with contact bleeding) and granular mucosa at the ?ileocecal valve. Biopsied. ?- The entire examined colon is normal. ?Recommendations: ?- Return patient to hospital ward for ongoing care. ?- Clear liquid diet. ?- Continue present medications. ?- Await pathology results. ?- IV Solumedrol 40 mg Q12 hours. ? ?Park Pope, MD ?01/10/2022, 7:35 AM ?PGY-1, [place] [facility] ?FPTS Intern pager: [phone], text pages welcome ?

## 2022-01-10 NOTE — Progress Notes (Signed)
UNASSIGNED PATIENT ?Subjective: ?Since I last evaluated the patient, she seems to be doing somewhat better.  The right lower quadrant pain has improved and she is now rating it at a 6 out of 10.  Colonoscopy showed congestion and inflammation in the cecum and the TI could not be intubated but multiple biopsies were taken from the cecum and the lip of the TI.  She seems to be responding to steroids well.  She denies having any nausea or vomiting., ?Objective: ?Vital signs in last 24 hours: ?Temp:  [97.1 ?F (36.2 ?C)-99.1 ?F (37.3 ?C)] 97.8 ?F (36.6 ?C) (03/15 0504) ?Pulse Rate:  [57-88] 72 (03/15 0504) ?Resp:  [17-20] 20 (03/14 2033) ?BP: (96-110)/(57-65) 105/63 (03/15 0504) ?SpO2:  [95 %-100 %] 99 % (03/15 0504) ?Weight:  [70 kg] 70 kg (03/14 1513) ?Last BM Date : 01/09/22 ? ?Intake/Output from previous day: ?03/14 0701 - 03/15 0700 ?In: 501.8 [I.V.:501.8] ?Out: -  ?Intake/Output this shift: ?No intake/output data recorded. ? ?General appearance: alert, cooperative, appears stated age, and no distress ?Resp: clear to auscultation bilaterally ?Cardio: regular rate and rhythm, S1, S2 normal, no murmur, click, rub or gallop ?GI: soft, non-tender; bowel sounds normal; no masses,  no organomegaly ? ?Lab Results: ?Recent Labs  ?  01/08/22 ?1120 01/09/22 ?[id number] 01/10/22 ?0131  ?WBC 14.6* 16.2* 16.7*  ?HGB 12.1 11.3* 12.2  ?HCT 36.9 33.0* 35.5*  ?PLT 300 273 339  ? ?BMET ?Recent Labs  ?  01/08/22 ?1120 01/09/22 ?[id number] 01/10/22 ?0131  ?NA 137 137 135  ?K 3.4* 3.3* 4.0  ?CL 105 104 103  ?CO2 25 25 21*  ?GLUCOSE 107* 103* 171*  ?BUN 9 <5* 5*  ?CREATININE 0.72 0.82 0.55  ?CALCIUM 8.5* 8.6* 9.2  ? ?LFT ?Recent Labs  ?  01/08/22 ?1120  ?PROT 6.5  ?ALBUMIN 3.2*  ?AST 12*  ?ALT 15  ?ALKPHOS 50  ?BILITOT 0.5  ? ?PT/INR ?No results for input(s): LABPROT, INR in the last 72 hours. ?Hepatitis Panel ?No results for input(s): HEPBSAG, HCVAB, HEPAIGM, HEPBIGM in the last 72 hours. ?C-Diff ?No results for input(s): CDIFFTOX in the last 72  hours. ?No results for input(s): CDIFFPCR in the last 72 hours. ?Fecal Lactopherrin ?No results for input(s): FECLLACTOFRN in the last 72 hours. ? ?Studies/Results: ?CT ABDOMEN PELVIS W CONTRAST ? ?Result Date: 01/08/2022 ?CLINICAL DATA:  Right lower quadrant abdominal pain times few weeks. EXAM: CT ABDOMEN AND PELVIS WITH CONTRAST TECHNIQUE: Multidetector CT imaging of the abdomen and pelvis was performed using the standard protocol following bolus administration of intravenous contrast. RADIATION DOSE REDUCTION: This exam was performed according to the departmental dose-optimization program which includes automated exposure control, adjustment of the mA and/or kV according to patient size and/or use of iterative reconstruction technique. CONTRAST:  141mL OMNIPAQUE IOHEXOL 300 MG/ML  SOLN COMPARISON:  December 15, 2021 FINDINGS: Lower chest: No acute abnormality. Hepatobiliary: No suspicious hepatic lesion. Gallbladder is unremarkable. No biliary ductal dilation. Pancreas: No pancreatic ductal dilation or evidence of acute inflammation. Spleen: Within normal limits. Adrenals/Urinary Tract: Bilateral adrenal glands appear normal. Symmetric bilateral renal enhancement. No hydronephrosis. Urinary bladder is unremarkable for degree of distension. Stomach/Bowel: No enteric contrast was administered. Stomach is unremarkable. Normal appendix. Similar prominent inflammation of the distal/terminal ileum extending through the cecum with adjacent fluid/stranding and prominent mesenteric lymph nodes. No pneumatosis, no pneumoperitoneum, no drainable fluid collection. There is a linear band of fluid/inflammation extending from terminal ileum to an adjacent of small bowel with some tethered appearance of the  bowel possibly reflecting a fistula on image 56/3. Vascular/Lymphatic: Normal caliber abdominal aorta. 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 No suspicious adnexal mass. Other: Trace pelvic free fluid is greater on the right and likely reflects a combination inflammatory fluid from the process in the terminal ileum and physiologic free fluid. No perianal disease identified. Musculoskeletal: No acute osseous abnormality. IMPRESSION: 1. Similar prominent inflammation of the distal/terminal ileum extending through the cecum with adjacent inflammatory fluid/stranding and prominent mesenteric lymph nodes, consistent with terminal ileitis. Primary differential considerations in this patient given appearance on prior CT and persistence today is inflammatory bowel disease/Crohn's with less likely differential consideration of infectious bowel disease. 2. Linear band of fluid/inflammation extending from the terminal ileum to an adjacent loop of small bowel with a slight tethered appearance of the bowel may reflect a fistula. 3.  No pneumoperitoneum and no drainable fluid collection. Electronically Signed   By: Dahlia Bailiff M.D.   On: 01/08/2022 13:22   ? ?Medications: I have reviewed the patient's current medications. ?Prior to Admission:  ?Medications Prior to Admission  ?Medication Sig Dispense Refill Last Dose  ? etonogestrel (NEXPLANON) 68 MG IMPL implant 1 each by Subdermal route once.   unknown  ? ?Scheduled: ? enoxaparin (LOVENOX) injection  40 mg Subcutaneous Q24H  ? methylPREDNISolone (SOLU-MEDROL) injection  40 mg Intravenous Q12H  ? ?Continuous: ? ?Assessment/Plan: ?Terminal ileitis-status post colonoscopy with cecal and distal TI biopsies pending-after dose of IV Solu-Medrol this morning the patient can be switched to oral steroids and if she is tolerating her diet well she can be discharged with plans for close follow-up in the office within the next couple of weeks.  ? LOS: 2 days  ? ?Shaylin Blatt ?01/10/2022, 5:25 AM ? ? ?

## 2022-01-11 DIAGNOSIS — E119 Type 2 diabetes mellitus without complications: Secondary | ICD-10-CM

## 2022-01-11 LAB — BASIC METABOLIC PANEL
Anion gap: 9 (ref 5–15)
BUN: 8 mg/dL (ref 6–20)
CO2: 24 mmol/L (ref 22–32)
Calcium: 9 mg/dL (ref 8.9–10.3)
Chloride: 103 mmol/L (ref 98–111)
Creatinine, Ser: 0.59 mg/dL (ref 0.44–1.00)
GFR, Estimated: >60 mL/min (ref >60)
Glucose, Bld: 135 mg/dL — ABNORMAL HIGH (ref 70–99)
Potassium: 4.4 mmol/L (ref 3.5–5.1)
Sodium: 136 mmol/L (ref 135–145)

## 2022-01-11 LAB — CBC
HCT: 34.1 % — ABNORMAL LOW (ref 36.0–46.0)
Hemoglobin: 11.3 g/dL — ABNORMAL LOW (ref 12.0–15.0)
MCH: 28.5 pg (ref 26.0–34.0)
MCHC: 33.1 g/dL (ref 30.0–36.0)
MCV: 86.1 fL (ref 80.0–100.0)
Platelets: 360 10*3/uL (ref 150–400)
RBC: 3.96 MIL/uL (ref 3.87–5.11)
RDW: 12.7 % (ref 11.5–15.5)
WBC: 25.3 10*3/uL — ABNORMAL HIGH (ref 4.0–10.5)
nRBC: 0 % (ref 0.0–0.2)

## 2022-01-11 LAB — C-REACTIVE PROTEIN: CRP: 4.1 mg/dL — ABNORMAL HIGH (ref <1.0)

## 2022-01-11 LAB — HEMOGLOBIN A1C
Hgb A1c MFr Bld: 6.3 % — ABNORMAL HIGH (ref 4.8–5.6)
Mean Plasma Glucose: 134 mg/dL

## 2022-01-11 NOTE — Progress Notes (Signed)
FPTS Interim Night Progress Note ? ?S:Patient sleeping comfortably.  Rounded with primary night RN.  No concerns voiced.  No orders required.   ? ?O: ?Today's Vitals  ? 01/10/22 1115 01/10/22 1541 01/10/22 2035 01/10/22 2105  ?BP:  113/68  105/73  ?Pulse:  76  64  ?Resp:  16  17  ?Temp:  98.1 ?F (36.7 ?C)  98.1 ?F (36.7 ?C)  ?TempSrc:  Oral  Oral  ?SpO2:  100%  100%  ?Weight:      ?Height:      ?PainSc: 2   7    ? ? ? ? ?A/P: ?Continue current management ? ?Dana Allan MD ?PGY-3, [facility] ?Service pager [phone]   ?

## 2022-01-11 NOTE — Progress Notes (Signed)
Family Medicine Teaching Service ?Daily Progress Note ?Intern Pager: [phone] ? ?Patient name: Sandra Foster Medical record number: [id number] ?Date of birth: 29-Feb-1991 Age: 31 y.o. Gender: female ? ?Primary Care Provider: Patient, No Pcp Per (Inactive) ?Consultants: GI ?Code Status: Full ? ?Pt Overview and Major Events to Date:  ?01/08/22-admitted ?01/09/22-colonoscopy ? ?Assessment and Plan: ?Sandra Foster is a 31 y.o. female presenting with RLQ abdominal pain . PMH is significant for visiting ED 2/17 for similar complaints, gestational diabetes, hernia s/p repair, on nexplanon ? ?RLQ pain  Diarrhea ?Likely Crohn's disease ?CRP 13.9>4.1.  Patient subjectively reports abdominal pain has been improving for past few days.  GI recommended transitioning to full liquid diet.  On IV Solu-Medrol 80 mg daily. WBC 16.7>25.3 secondary to IV steroids. Mildly hypotensive but this may be patient's baseline as she is currently asymptomatic. Discontinue  ?-On IV Solu-Medrol 80 mg daily. Will transition to PO on discharge ?-Advance diet as tolerated ?-Anticipate discharge this weekend ? ?Prediabetes ?A1c 6.3.  Glucose ranges in the 100s. ?-Hold off on starting glipizide given patient's glucose level is within acceptable range and glipizide would more likely be harmful than beneficial for patient at this time. ?-Continue to monitor ? ?Depression ?Denies side effect to medication at this time ?-Zoloft 25 mg qd ? ?Hypokalemia, resolved ?Potassium 4.0>4.4 ? ?FEN/GI: Full liquid, advance diet as tolerated ?PPx: Lovenox ?Dispo:Home in 2-3 days. Barriers include ongoing IV treatment.  ? ?Subjective:  ?Patient seen and assessed at bedside.  Boyfriend in room.  Patient reports abdominal pain has significant improved since yesterday.  Patient denies any additional acute concerns at this time.  Patient tolerated Zoloft well.  Continues to tolerate IV Solu-Medrol well as well. ? ?Objective: ?Temp:  [98.1 ?F (36.7 ?C)] 98.1 ?F (36.7 ?C)  (03/16 2703) ?Pulse Rate:  [56-76] 56 (03/16 0552) ?Resp:  [16-17] 17 (03/16 0552) ?BP: (99-113)/(68-73) 99/70 (03/16 5009) ?SpO2:  [98 %-100 %] 98 % (03/16 0552) ?Physical Exam: ?General: Resting comfortably. NAD ?Cardiovascular: RRR ?Respiratory: CTAB ?Abdomen: Soft, nontender to palpation ?Extremities: Able to move all extremities.  No edema noted. ? ?Laboratory: ?Recent Labs  ?Lab 01/09/22 ?3818 01/10/22 ?0131 01/11/22 ?2993  ?WBC 16.2* 16.7* 25.3*  ?HGB 11.3* 12.2 11.3*  ?HCT 33.0* 35.5* 34.1*  ?PLT 273 339 360  ? ?Recent Labs  ?Lab 01/08/22 ?1120 01/09/22 ?7169 01/10/22 ?0131 01/11/22 ?6789  ?NA 137 137 135 136  ?K 3.4* 3.3* 4.0 4.4  ?CL 105 104 103 103  ?CO2 25 25 21* 24  ?BUN 9 <5* 5* 8  ?CREATININE 0.72 0.82 0.55 0.59  ?CALCIUM 8.5* 8.6* 9.2 9.0  ?PROT 6.5  --   --   --   ?BILITOT 0.5  --   --   --   ?ALKPHOS 50  --   --   --   ?ALT 15  --   --   --   ?AST 12*  --   --   --   ?GLUCOSE 107* 103* 171* 135*  ? ? ? ? ?Imaging/Diagnostic Tests: ?No results found. No new labs. ? ?Park Pope, MD ?01/11/2022, 7:34 AM ?PGY-1, [place] [facility] ?FPTS Intern pager: [phone], text pages welcome ?

## 2022-01-11 NOTE — TOC Progression Note (Signed)
Transition of Care (TOC) - Progression Note  ? ? ?Patient Details  ?Name: DEFNE GERLING ?MRN: [id number] ?Date of Birth: 03-Apr-1991 ? ?Transition of Care (TOC) CM/SW Contact  ?Kingsley Plan, RN ?Phone Number: ?01/11/2022, 2:43 PM ? ?Clinical Narrative:    ? ? ? ?Transition of Care (TOC) Screening Note ? ? ?Patient Details  ?Name: BRITTANYA WINBURN ?Date of Birth: [birth year] ? ? ? ? ?Transition of Care Department [facility]) has reviewed patient and no TOC needs have been identified at this time. We will continue to monitor patient advancement through interdisciplinary progression rounds. If new patient transition needs arise, please place a TOC consult. ?  ?  ?  ? ?Expected Discharge Plan and Services ?  ?  ?  ?  ?  ?                ?  ?  ?  ?  ?  ?  ?  ?  ?  ?  ? ? ?Social Determinants of Health (SDOH) Interventions ?  ? ?Readmission Risk Interventions ?No flowsheet data found. ? ?

## 2022-01-11 NOTE — Progress Notes (Signed)
Subjective: ?Feeling well.  The abdominal pain is improving and it is controlled with pain medications.  No bowel movements at this time. ? ?Objective: ?Vital signs in last 24 hours: ?Temp:  [97.9 ?F (36.6 ?C)-98.1 ?F (36.7 ?C)] 98.1 ?F (36.7 ?C) (03/16 9390) ?Pulse Rate:  [56-77] 56 (03/16 0552) ?Resp:  [16-17] 17 (03/16 0552) ?BP: (99-113)/(62-73) 99/70 (03/16 3009) ?SpO2:  [98 %-100 %] 98 % (03/16 0552) ?Last BM Date : 01/09/21 ? ?Intake/Output from previous day: ?No intake/output data recorded. ?Intake/Output this shift: ?No intake/output data recorded. ? ?General appearance: alert and no distress ?GI: soft, non-tender; bowel sounds normal; no masses,  no organomegaly ? ?Lab Results: ?Recent Labs  ?  01/09/22 ?2330 01/10/22 ?0131 01/11/22 ?0762  ?WBC 16.2* 16.7* 25.3*  ?HGB 11.3* 12.2 11.3*  ?HCT 33.0* 35.5* 34.1*  ?PLT 273 339 360  ? ?BMET ?Recent Labs  ?  01/09/22 ?2633 01/10/22 ?0131 01/11/22 ?3545  ?NA 137 135 136  ?K 3.3* 4.0 4.4  ?CL 104 103 103  ?CO2 25 21* 24  ?GLUCOSE 103* 171* 135*  ?BUN <5* 5* 8  ?CREATININE 0.82 0.55 0.59  ?CALCIUM 8.6* 9.2 9.0  ? ?LFT ?Recent Labs  ?  01/08/22 ?1120  ?PROT 6.5  ?ALBUMIN 3.2*  ?AST 12*  ?ALT 15  ?ALKPHOS 50  ?BILITOT 0.5  ? ?PT/INR ?No results for input(s): LABPROT, INR in the last 72 hours. ?Hepatitis Panel ?No results for input(s): HEPBSAG, HCVAB, HEPAIGM, HEPBIGM in the last 72 hours. ?C-Diff ?No results for input(s): CDIFFTOX in the last 72 hours. ?Fecal Lactopherrin ?No results for input(s): FECLLACTOFRN in the last 72 hours. ? ?Studies/Results: ?No results found. ? ?Medications: Scheduled: ? enoxaparin (LOVENOX) injection  40 mg Subcutaneous Q24H  ? methylPREDNISolone (SOLU-MEDROL) injection  80 mg Intravenous Q24H  ? sertraline  25 mg Oral Daily  ? ?Continuous: ? ?Assessment/Plan: ?1) Probable Crohn's disease. ?2) Abdominal pain - improving. ? ? Clinically she appears well.  The abdominal pain is improving with pain control and Solumedrol.  She feels as if she  can increase her diet. ? ?Plan: ?1) Advance to a full liquid diet. ?2) Continue with Solumedrol. ?3) Anticipate Foster/C sometime this weekend if she continues to progress. ? LOS: 3 days  ? ?Sandra Foster ?01/11/2022, 5:58 AM  ?

## 2022-01-12 DIAGNOSIS — R7303 Prediabetes: Secondary | ICD-10-CM

## 2022-01-12 LAB — SURGICAL PATHOLOGY

## 2022-01-12 MED ORDER — PREDNISONE 20 MG PO TABS
40.0000 mg | ORAL_TABLET | Freq: Every day | ORAL | Status: DC
  Administered 2022-01-13: 40 mg via ORAL
  Filled 2022-01-12: qty 2

## 2022-01-12 NOTE — Progress Notes (Signed)
FPTS Brief Progress Note ? ?S: Pt lying awake in bed talking on her phone, states she has no concerns at this time. ? ? ?O: ?BP 93/64 (BP Location: Left Arm)   Pulse 62   Temp 98.7 ?F (37.1 ?C) (Oral)   Resp 17   Ht 5\' 6"  (1.676 m)   Wt 70 kg   SpO2 100%   BMI 24.91 kg/m?   ?General: lying in bed, NAD ?Respiratory: breathing comfortably on RA ? ?A/P: ?Continue to follow plan outlined in day teams progress note ?- Orders reviewed. Labs for AM not ordered/needed ? ? ?Precious Gilding, DO ?01/12/2022, 2:18 AM ?PGY-1, [place] [facility] Night Resident  ?Please page [phone] with questions.  ?  ?

## 2022-01-12 NOTE — Progress Notes (Addendum)
Subjective: ?Feeling "great!" ? ?Objective: ?Vital signs in last 24 hours: ?Temp:  [98.2 ?F (36.8 ?C)-98.7 ?F (37.1 ?C)] 98.3 ?F (36.8 ?C) (03/17 0900) ?Pulse Rate:  [57-70] 58 (03/17 0900) ?Resp:  [17] 17 (03/16 1440) ?BP: (93-110)/(54-73) 110/73 (03/17 0900) ?SpO2:  [99 %-100 %] 99 % (03/17 0900) ?Last BM Date : 01/11/22 (per pt) ? ?Intake/Output from previous day: ?03/16 0701 - 03/17 0700 ?In: 480 [P.O.:480] ?Out: -  ?Intake/Output this shift: ?No intake/output data recorded. ? ?General appearance: alert and no distress ?GI: soft, non-tender; bowel sounds normal; no masses,  no organomegaly ? ?Lab Results: ?Recent Labs  ?  01/10/22 ?0131 01/11/22 ?2947  ?WBC 16.7* 25.3*  ?HGB 12.2 11.3*  ?HCT 35.5* 34.1*  ?PLT 339 360  ? ?BMET ?Recent Labs  ?  01/10/22 ?0131 01/11/22 ?6546  ?NA 135 136  ?K 4.0 4.4  ?CL 103 103  ?CO2 21* 24  ?GLUCOSE 171* 135*  ?BUN 5* 8  ?CREATININE 0.55 0.59  ?CALCIUM 9.2 9.0  ? ?LFT ?No results for input(s): PROT, ALBUMIN, AST, ALT, ALKPHOS, BILITOT, BILIDIR, IBILI in the last 72 hours. ?PT/INR ?No results for input(s): LABPROT, INR in the last 72 hours. ?Hepatitis Panel ?No results for input(s): HEPBSAG, HCVAB, HEPAIGM, HEPBIGM in the last 72 hours. ?C-Diff ?No results for input(s): CDIFFTOX in the last 72 hours. ?Fecal Lactopherrin ?No results for input(s): FECLLACTOFRN in the last 72 hours. ? ?Studies/Results: ?No results found. ? ?Medications: Scheduled: ? enoxaparin (LOVENOX) injection  40 mg Subcutaneous Q24H  ? methylPREDNISolone (SOLU-MEDROL) injection  80 mg Intravenous Q24H  ? sertraline  25 mg Oral Daily  ? ?Continuous: ? ?Assessment/Plan: ?1) Presumed Crohn's disease ?2) Abdominal pain - resolved. ? ? The patient states that she feels great.  She denies any abdominal pain and she does not report using any pain medications for the past 2 days.  Today she had a bowel movement. ? ?Plan: ?1) Advance to a regular diet. ?2) Continue with Solumedrol for today. ?3) If she is well tomorrow  AM she can be discharged home with prednisone 40 mg.  Please dispense in 10 mg tablets to allow for a taper as an outpatient. ?4) Follow with Dr. Loreta Ave in 1-2 weeks. ?5) [facility] is covering this weekend if the patient is still an inpatient. ? LOS: 4 days  ? ?Sandra Foster D ?01/12/2022, 12:25 PM  ?

## 2022-01-12 NOTE — Discharge Instructions (Addendum)
Dear Sandra Foster, ? ?Thank you for letting us participate in your care. 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 You were treated with and discharged on Steroids.   ? ?POST-HOSPITAL & CARE INSTRUCTIONS ?Take prednisone 40 mg daily until you follow up with Dr. Loreta Ave and they will further advise you on prednisone dosage ?Go to your follow up appointments (listed below) ? ? ?DOCTOR'S APPOINTMENT   ?No future appointments. ? Follow-up Information   ? ? Charna Elizabeth, MD Follow up in 1 week(s).   ?Specialty: Gastroenterology ?Why: Please follow up with Dr. Loreta Ave in 1-2 weeks for your inflammatory bowel disease. ?Contact information: ?[address] ?[place] [address] ?[phone] ? ? ?  ?  ? ?  ?  ? ?  ? ? ?Take care and be well! ? ?Family Medicine Teaching Service Inpatient Team ?[facility]  ?[name]  ?[address] [place], [place] [address] ?[phone] ? ? ? ?

## 2022-01-12 NOTE — Progress Notes (Addendum)
Family Medicine Teaching Service ?Daily Progress Note ?Intern Pager: [phone] ? ?Patient name: Sandra Foster Medical record number: [id number] ?Date of birth: Nov 09, 1990 Age: 31 y.o. Gender: female ? ?Primary Care Provider: Patient, No Pcp Per (Inactive) ?Consultants: GI ?Code Status: Full ? ?Pt Overview and Major Events to Date:  ?01/08/22-admitted ?01/09/22-colonoscopy ? ?Assessment and Plan: ?Sandra Foster is a 31 y.o. female presenting with RLQ abdominal pain . PMH is significant for visiting ED 2/17 for similar complaints, gestational diabetes, hernia s/p repair, on nexplanon ? ?RLQ pain  Diarrhea ?Likely Crohn's disease ?Patient reports no abdominal pain.  Has had a full bowel movement today.  GI recommends regular diet and can discharge tomorrow on oral prednisone. ?-Continue IV Solu-Medrol 80 mg for today. 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 Negative for dysplasia or malignancy. Patchy lymphoplasmacytic inflammation is seen in lamina propria with  ?chronic architectural changes and ulceration. The differential  ?diagnosis includes infectious etiology, drugs especially NSAIDs and IBD ?-GI recommends outpatient follow-up with Dr. Loreta Foster in 1 to 2 weeks ?-Changed diet to regular ?-Plan for discharge tomorrow ? ?Prediabetes ?A1c 6.3.  Glucose ranges in the 100s. ?-Hold off on starting glipizide given patient's glucose level is within acceptable range and glipizide would more likely be harmful than beneficial for patient at this time. ?-Continue to monitor ? ?Depression ?Continues to deny side effects to Zoloft ?-Zoloft 25 mg qd ? ?FEN/GI: Full liquid, advance diet as tolerated ?PPx: Lovenox ?Dispo:Home in 2-3 days. Barriers include ongoing IV treatment.  ? ?Subjective:  ?Patient seen and assessed at bedside.  Boyfriend in room.  Patient reports abdominal pain has significant improved since yesterday.  Patient denies any additional acute  concerns at this time.  Patient tolerated Zoloft well.  Continues to tolerate IV Solu-Medrol well as well. ? ?Objective: ?Temp:  [98.2 ?F (36.8 ?C)-98.7 ?F (37.1 ?C)] 98.4 ?F (36.9 ?C) (03/17 1644) ?Pulse Rate:  [57-63] 63 (03/17 1644) ?Resp:  [17] 17 (03/17 1644) ?BP: (91-110)/(54-73) 91/60 (03/17 1644) ?SpO2:  [99 %-100 %] 99 % (03/17 1644) ?Physical Exam: ?General: Resting comfortably.  Talking on phone with mom.  NAD ?Cardiovascular: RRR ?Respiratory: CTAB ?Abdomen: Soft, nondistended, nontender to palpation ?Extremities: Able to move all extremities.  No edema noted. ? ?Laboratory: ?Recent Labs  ?Lab 01/09/22 ?9563 01/10/22 ?0131 01/11/22 ?8756  ?WBC 16.2* 16.7* 25.3*  ?HGB 11.3* 12.2 11.3*  ?HCT 33.0* 35.5* 34.1*  ?PLT 273 339 360  ? ? ?Recent Labs  ?Lab 01/08/22 ?1120 01/09/22 ?4332 01/10/22 ?0131 01/11/22 ?9518  ?NA 137 137 135 136  ?K 3.4* 3.3* 4.0 4.4  ?CL 105 104 103 103  ?CO2 25 25 21* 24  ?BUN 9 <5* 5* 8  ?CREATININE 0.72 0.82 0.55 0.59  ?CALCIUM 8.5* 8.6* 9.2 9.0  ?PROT 6.5  --   --   --   ?BILITOT 0.5  --   --   --   ?ALKPHOS 50  --   --   --   ?ALT 15  --   --   --   ?AST 12*  --   --   --   ?GLUCOSE 107* 103* 171* 135*  ? ? ? ? ? ?Imaging/Diagnostic Tests: ?No results found. No new labs. ? ?Sandra Pope, MD ?01/12/2022, 5:43 PM ?PGY-1, [place] [facility] ?FPTS Intern pager: [phone], text pages welcome ?

## 2022-01-12 NOTE — Progress Notes (Signed)
Patient called RN into the room and stated that every time she gets the steroid injection--her left arm where she has her Nexaplon that spot gets red and itchy.  RN notified MD and she stated they would discuss it and round on the patient.  ?

## 2022-01-12 NOTE — Discharge Summary (Shared)
Family Medicine Teaching Service ?Hospital Discharge Summary ? ?Patient name: Sandra Foster Medical record number: [id number] ?Date of birth: 11/01/1990 Age: 31 y.o. Gender: female ?Date of Admission: 01/08/2022  Date of Discharge: 01/13/22 ?Admitting Physician: Park Pope, MD ? ?Primary Care Provider: Patient, No Pcp Per (Inactive) ?Consultants: GI ? ?Indication for Hospitalization: Terminal Ileitis ? ?Discharge Diagnoses/Problem List:  ?Inflammatory Bowel Disease ?Prediabetes ?Depression ? ?Disposition: home ? ?Discharge Condition: stable ? ?Discharge Exam:  ?*** ? ?Brief Hospital Course:  ?Sandra Foster is a 31 y.o. female presenting with RLQ abdominal pain . PMH is significant for visiting ED 2/17 for similar complaints, gestational diabetes, hernia s/p repair ? ?RLQ pain  Diarrhea ?Likely Crohn's Disease ?Pt seen in ED 3 weeks prior, dx with terminal ileitis and sent home with Flagyl and cipro but abdominal pain persisted and in addition pt began having diarrhea. In ED, BP soft at 95/54 otherwise VSS, WBC 14.6, Lipase and AST/ALT WNL and UA unremarkable. 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 GI  was consulted and recommended tx with steriods. Pt had baseline CRP of 12 and sed rate of 28. Colonoscopy showed Congested, erythematous, friable, and granular mucosa at the ileocecal valve which was biopsied.  The entire examined colon was normal.  Patient was started on IV Solu-Medrol 80 mg every day and thin liquid diet.  Patient was continued on IV Solu-Medrol for 3 more days prior to transitioning to oral prednisone.  Patient's diet was slowly advanced as tolerated was back to regular diet prior to discharge.  Patient began having regular bowel movements after third day of IV Solu-Medrol. Biopsy results showed severe active chronic nonspecific enteritis with ulcerations.Negative for dysplasia or malignancy.  Patchy lymphoplasmacytic  inflammation is seen in lamina propria with chronic architectural changes and ulceration.  The differential diagnosis includes infectious etiology, drugs especially NSAIDs and IBD ? ?Prediabetes ?A1c 6.3 on this admission.  Glucose ranges in 100s.  ? ?Depression ?Patient reports multiple life stressors that has negatively impacted her. Patient was started on Zoloft 25 mg qd and tolerated it well. ? ? ? ?Issues for Follow Up:  ?Assess if patient's depression and anxiety are well controlled ?Assess if having further abdominal pain and regular bowel movements ? ?Significant Procedures:  ? ?Significant Labs and Imaging:  ?Recent Labs  ?Lab 01/09/22 ?3790 01/10/22 ?0131 01/11/22 ?2409  ?WBC 16.2* 16.7* 25.3*  ?HGB 11.3* 12.2 11.3*  ?HCT 33.0* 35.5* 34.1*  ?PLT 273 339 360  ? ?Recent Labs  ?Lab 01/08/22 ?1120 01/09/22 ?7353 01/10/22 ?0131 01/11/22 ?2992  ?NA 137 137 135 136  ?K 3.4* 3.3* 4.0 4.4  ?CL 105 104 103 103  ?CO2 25 25 21* 24  ?GLUCOSE 107* 103* 171* 135*  ?BUN 9 <5* 5* 8  ?CREATININE 0.72 0.82 0.55 0.59  ?CALCIUM 8.5* 8.6* 9.2 9.0  ?ALKPHOS 50  --   --   --   ?AST 12*  --   --   --   ?ALT 15  --   --   --   ?ALBUMIN 3.2*  --   --   --   ? ? ? ? ?Results/Tests Pending at Time of Discharge: none ? ?Discharge Medications:  ?Allergies as of 01/12/2022   ?No Known Allergies ?  ?Med Rec must be completed prior to using this SMARTLINK*** ? ? ? ? ? ? ?Discharge Instructions: Please refer to Patient Instructions section of EMR for full details.  Patient was counseled important signs and symptoms that should prompt return to medical care, changes in medications, dietary instructions, activity restrictions, and follow up appointments.  ? ?Follow-Up Appointments: ? Follow-up Information   ? ? Charna Elizabeth, MD Follow up in 1 week(s).   ?Specialty: Gastroenterology ?Why: Please follow up with Dr. Loreta Ave in 1-2 weeks for your inflammatory bowel disease. ?Contact information: ?[address] ?[place]  [address] ?[phone] ? ? ?  ?  ? ?  ?  ? ?  ? ? ?Park Pope, MD ?01/12/2022, 6:09 PM ?PGY-1, [facility] [facility] ?

## 2022-01-13 MED ORDER — SERTRALINE HCL 25 MG PO TABS: 25.0000 mg | ORAL_TABLET | Freq: Every day | ORAL | 0 refills | Status: DC

## 2022-01-13 MED ORDER — PREDNISONE 10 MG PO TABS: 40.0000 mg | ORAL_TABLET | Freq: Every day | ORAL | 0 refills | Status: AC

## 2022-01-13 NOTE — Plan of Care (Signed)

## 2022-01-13 NOTE — Discharge Summary (Addendum)
Family Medicine Teaching Service ?Hospital Discharge Summary ? ?Patient name: Sandra Foster Medical record number: [id number] ?Date of birth: 20-Oct-1991 Age: 31 y.o. Gender: female ?Date of Admission: 01/08/2022  Date of Discharge: 01/13/2022 ?Admitting Physician: France Ravens, MD ? ?Primary Care Provider: Patient, No Pcp Per (Inactive) ?Consultants: GI ? ?Indication for Hospitalization: RLQ abdominal pain with terminal ileitis on imaging ? ?Discharge Diagnoses/Problem List:  ?Principal Problem: ?  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  Abdominal pain, acute, right lower quadrant ?  Hypokalemia ?  Diarrhea ?  Dehydration ?  Fistula of intestine, excluding rectum and anus ? ? ? ?Disposition: home ? ?Discharge Condition: stable ? ?Discharge Exam:  ?General: Patient lying in bed, pleasant to speak with, NAD ?Cardiac: RRR, normal S1/S2 ?Respiratory: CTAB, normal effort ? ?Brief Hospital Course:  ?Sandra Foster is a 31 y.o. female presenting with RLQ abdominal pain . PMH is significant for visiting ED 2/17 for similar complaints, gestational diabetes, hernia s/p repair ? ?RLQ pain  Diarrhea ?Likely Crohn's Disease ?Pt seen in ED 3 weeks prior, dx with terminal ileitis and sent home with Flagyl and cipro but abdominal pain persisted and in addition pt began having diarrhea. In ED, BP soft at 95/54 otherwise VSS, WBC 14.6, Lipase and AST/ALT WNL and UA unremarkable. 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 GI  was consulted and recommended tx with steriods. Pt had baseline CRP of 12 and sed rate of 28. Colonoscopy showed Congested, erythematous, friable, and granular mucosa at the ileocecal valve which was biopsied.  The entire examined colon was normal.  Patient was started on IV Solu-Medrol 80 mg every day and thin liquid diet.  Patient was continued on IV Solu-Medrol for 3 more days prior to transitioning to oral prednisone.  Patient's diet  was slowly advanced as tolerated was back to regular diet prior to discharge.  Patient began having regular bowel movements after third day of IV Solu-Medrol. Biopsy results showed severe active chronic nonspecific enteritis with ulcerations. Negative for dysplasia or malignancy.  Patchy lymphoplasmacytic inflammation is seen in lamina propria with chronic architectural changes and ulceration.  The differential diagnosis includes infectious etiology, drugs especially NSAIDs and IBD ? ?Prediabetes ?A1c 6.3 on this admission.  Glucose ranges in 100s.  ? ?Depression ?Patient reports multiple life stressors that has negatively impacted her. Patient was started on Zoloft 25 mg qd and tolerated it well. ? ? ?Issues for Follow up: ?Ensure out patient f/u with GI, Dr. Collene Mares in 1-2 weeks ?Monitor pt on Zoloft, 25 mg qd, titrate as needed  ? ? ? ?Significant Procedures: none ? ?Significant Labs and Imaging:  ?Recent Labs  ?Lab 01/09/22 ?[id number] 01/10/22 ?0131 01/11/22 ?[id number]  ?WBC 16.2* 16.7* 25.3*  ?HGB 11.3* 12.2 11.3*  ?HCT 33.0* 35.5* 34.1*  ?PLT 273 339 360  ? ?Recent Labs  ?Lab 01/08/22 ?1120 01/09/22 ?[id number] 01/10/22 ?0131 01/11/22 ?[id number]  ?NA 137 137 135 136  ?K 3.4* 3.3* 4.0 4.4  ?CL 105 104 103 103  ?CO2 25 25 21* 24  ?GLUCOSE 107* 103* 171* 135*  ?BUN 9 <5* 5* 8  ?CREATININE 0.72 0.82 0.55 0.59  ?CALCIUM 8.5* 8.6* 9.2 9.0  ?ALKPHOS 50  --   --   --   ?AST 12*  --   --   --   ?ALT 15  --   --   --   ?ALBUMIN 3.2*  --   --   --   ? ? ? ? ?  Results/Tests Pending at Time of Discharge: None ? ?Discharge Medications:  ?Allergies as of 01/13/2022   ?No Known Allergies ?  ? ?  ?Medication List  ?  ? ?TAKE these medications   ? ?Nexplanon 68 MG Impl implant ?Generic drug: etonogestrel ?1 each by Subdermal route once. ?  ?predniSONE 10 MG tablet ?Commonly known as: DELTASONE ?Take 4 tablets (40 mg total) by mouth daily with breakfast for 28 days. ?  ?sertraline 25 MG tablet ?Commonly known as: ZOLOFT ?Take 1 tablet (25 mg total) by  mouth daily. ?  ? ?  ? ? ?Discharge Instructions: Please refer to Patient Instructions section of EMR for full details.  Patient was counseled important signs and symptoms that should prompt return to medical care, changes in medications, dietary instructions, activity restrictions, and follow up appointments.  ? ?Follow-Up Appointments: ? Follow-up Information   ? ? Sandra Craver, MD Follow up in 1 week(s).   ?Specialty: Gastroenterology ?Why: Please follow up with Dr. Collene Mares in 1-2 weeks for your inflammatory bowel disease. ?Contact information: ?[address] ?[place] [address] ?[phone] ? ? ?  ?  ? ?  ?  ? ?  ? ? ?Precious Gilding, DO ?01/13/2022, 7:58 AM ?PGY-1, [facility] ? ?

## 2022-01-13 NOTE — Progress Notes (Signed)
FPTS Brief Progress Note ? ?S: Pt sleeping soundly. No concerns at this time. ? ? ?O: ?BP (!) 94/50 (BP Location: Left Arm)   Pulse (!) 51   Temp 98.1 ?F (36.7 ?C) (Oral)   Resp 15   Ht 5\' 6"  (1.676 m)   Wt 70 kg   SpO2 100%   BMI 24.91 kg/m?   ?General: lying in bed, NAD ?Respiratory: breathing comfortably on RA ? ?A/P: ?Continue to follow plan outlined in day teams progress note ?- Orders reviewed. Labs for AM not ordered, not needed ? ? ? , DO ?01/13/2022, 1:52 AM ?PGY-1, [facility] Night Resident  ?Please page [phone] with questions.  ? ? ?

## 2022-01-13 NOTE — Progress Notes (Signed)
[name] to be D/C'd  per MD order.  Discussed with the patient and all questions fully answered. ? ?VSS, Skin clean, dry and intact without evidence of skin break down, no evidence of skin tears noted. ? ?IV catheter discontinued intact. Site without signs and symptoms of complications. Dressing and pressure applied. ? ?An After Visit Summary was printed and given to the patient. Patient received prescription. ? ?D/c education completed with patient/family including follow up instructions, medication list, d/c activities limitations if indicated, with other d/c instructions as indicated by MD - patient able to verbalize understanding, all questions fully answered.  ? ?Patient instructed to return to ED, call 911, or call MD for any changes in condition.  ? ?Patient to be escorted via WC, and D/C home via private auto.  ?

## 2022-02-11 ENCOUNTER — Other Ambulatory Visit: Payer: Self-pay | Admitting: Student

## 2022-02-11 DIAGNOSIS — K50013 Crohn's disease of small intestine with fistula: Secondary | ICD-10-CM

## 2022-04-22 ENCOUNTER — Emergency Department (HOSPITAL_COMMUNITY): Payer: Medicaid Other

## 2022-04-22 ENCOUNTER — Telehealth: Payer: Self-pay | Admitting: Physician Assistant

## 2022-04-22 ENCOUNTER — Other Ambulatory Visit: Payer: Self-pay

## 2022-04-22 ENCOUNTER — Inpatient Hospital Stay (HOSPITAL_COMMUNITY)
Admission: EM | Admit: 2022-04-22 | Discharge: 2022-04-26 | DRG: 387 | Disposition: A | Discharge status: Home / Self Care | Payer: Medicaid Other | Attending: Student | Admitting: Student

## 2022-04-22 ENCOUNTER — Encounter (HOSPITAL_COMMUNITY): Payer: Self-pay | Admitting: Emergency Medicine

## 2022-04-22 DIAGNOSIS — Z79620 Long term (current) use of immunosuppressive biologic: Secondary | ICD-10-CM

## 2022-04-22 DIAGNOSIS — I9589 Other hypotension: Secondary | ICD-10-CM | POA: Diagnosis present

## 2022-04-22 DIAGNOSIS — K5090 Crohn's disease, unspecified, without complications: Secondary | ICD-10-CM | POA: Diagnosis not present

## 2022-04-22 DIAGNOSIS — Z3202 Encounter for pregnancy test, result negative: Secondary | ICD-10-CM | POA: Diagnosis present

## 2022-04-22 DIAGNOSIS — Z8249 Family history of ischemic heart disease and other diseases of the circulatory system: Secondary | ICD-10-CM

## 2022-04-22 DIAGNOSIS — Z79899 Other long term (current) drug therapy: Secondary | ICD-10-CM

## 2022-04-22 DIAGNOSIS — E119 Type 2 diabetes mellitus without complications: Secondary | ICD-10-CM | POA: Insufficient documentation

## 2022-04-22 DIAGNOSIS — K50014 Crohn's disease of small intestine with abscess: Secondary | ICD-10-CM | POA: Diagnosis present

## 2022-04-22 DIAGNOSIS — D509 Iron deficiency anemia, unspecified: Secondary | ICD-10-CM | POA: Diagnosis present

## 2022-04-22 DIAGNOSIS — I959 Hypotension, unspecified: Secondary | ICD-10-CM

## 2022-04-22 DIAGNOSIS — Z720 Tobacco use: Secondary | ICD-10-CM

## 2022-04-22 DIAGNOSIS — R109 Unspecified abdominal pain: Secondary | ICD-10-CM

## 2022-04-22 DIAGNOSIS — K5000 Crohn's disease of small intestine without complications: Principal | ICD-10-CM | POA: Diagnosis present

## 2022-04-22 DIAGNOSIS — D649 Anemia, unspecified: Secondary | ICD-10-CM

## 2022-04-22 DIAGNOSIS — E1165 Type 2 diabetes mellitus with hyperglycemia: Secondary | ICD-10-CM | POA: Diagnosis present

## 2022-04-22 DIAGNOSIS — F17210 Nicotine dependence, cigarettes, uncomplicated: Secondary | ICD-10-CM | POA: Diagnosis present

## 2022-04-22 LAB — URINALYSIS, ROUTINE W REFLEX MICROSCOPIC
Bilirubin Urine: NEGATIVE
Glucose, UA: NEGATIVE mg/dL
Hgb urine dipstick: NEGATIVE
Ketones, ur: 5 mg/dL — AB
Leukocytes,Ua: NEGATIVE
Nitrite: NEGATIVE
Protein, ur: NEGATIVE mg/dL
Specific Gravity, Urine: 1.017 (ref 1.005–1.030)
pH: 6 (ref 5.0–8.0)

## 2022-04-22 LAB — LIPASE, BLOOD: Lipase: 21 U/L (ref 11–51)

## 2022-04-22 LAB — COMPREHENSIVE METABOLIC PANEL
ALT: 8 U/L (ref 0–44)
AST: 9 U/L — ABNORMAL LOW (ref 15–41)
Albumin: 3.4 g/dL — ABNORMAL LOW (ref 3.5–5.0)
Alkaline Phosphatase: 62 U/L (ref 38–126)
Anion gap: 7 (ref 5–15)
BUN: 10 mg/dL (ref 6–20)
CO2: 23 mmol/L (ref 22–32)
Calcium: 8.7 mg/dL — ABNORMAL LOW (ref 8.9–10.3)
Chloride: 106 mmol/L (ref 98–111)
Creatinine, Ser: 0.59 mg/dL (ref 0.44–1.00)
GFR, Estimated: >60 mL/min (ref >60)
Glucose, Bld: 112 mg/dL — ABNORMAL HIGH (ref 70–99)
Potassium: 3.6 mmol/L (ref 3.5–5.1)
Sodium: 136 mmol/L (ref 135–145)
Total Bilirubin: 0.4 mg/dL (ref 0.3–1.2)
Total Protein: 7.1 g/dL (ref 6.5–8.1)

## 2022-04-22 LAB — SEDIMENTATION RATE: Sed Rate: 31 mm/hr — ABNORMAL HIGH (ref 0–22)

## 2022-04-22 LAB — CBC
HCT: 35.6 % — ABNORMAL LOW (ref 36.0–46.0)
Hemoglobin: 11.5 g/dL — ABNORMAL LOW (ref 12.0–15.0)
MCH: 28.4 pg (ref 26.0–34.0)
MCHC: 32.3 g/dL (ref 30.0–36.0)
MCV: 87.9 fL (ref 80.0–100.0)
Platelets: 337 10*3/uL (ref 150–400)
RBC: 4.05 MIL/uL (ref 3.87–5.11)
RDW: 14.6 % (ref 11.5–15.5)
WBC: 10.2 10*3/uL (ref 4.0–10.5)
nRBC: 0 % (ref 0.0–0.2)

## 2022-04-22 LAB — I-STAT BETA HCG BLOOD, ED (MC, WL, AP ONLY): I-stat hCG, quantitative: <5 m[IU]/mL (ref <5)

## 2022-04-22 LAB — C-REACTIVE PROTEIN: CRP: 13.8 mg/dL — ABNORMAL HIGH (ref <1.0)

## 2022-04-22 MED ORDER — METHYLPREDNISOLONE SODIUM SUCC 40 MG IJ SOLR: 30.0000 mg | Freq: Two times a day (BID) | INTRAMUSCULAR | Status: DC

## 2022-04-22 MED ORDER — ACETAMINOPHEN 325 MG PO TABS: 650.0000 mg | ORAL_TABLET | Freq: Four times a day (QID) | ORAL | Status: DC | PRN

## 2022-04-22 MED ORDER — SODIUM CHLORIDE 0.9 % IV SOLN: INTRAVENOUS | Status: DC

## 2022-04-22 MED ORDER — HYDROMORPHONE HCL 1 MG/ML IJ SOLN
0.5000 mg | INTRAMUSCULAR | Status: DC | PRN
  Administered 2022-04-22: 0.5 mg via INTRAVENOUS
  Filled 2022-04-22: qty 0.5

## 2022-04-22 MED ORDER — HYDROMORPHONE HCL 1 MG/ML IJ SOLN
0.5000 mg | Freq: Once | INTRAMUSCULAR | Status: AC
  Administered 2022-04-22: 0.5 mg via INTRAVENOUS
  Filled 2022-04-22: qty 1

## 2022-04-22 MED ORDER — POLYVINYL ALCOHOL 1.4 % OP SOLN: 1.0000 [drp] | Freq: Every day | OPHTHALMIC | Status: DC | PRN

## 2022-04-22 MED ORDER — OXYCODONE HCL 5 MG PO TABS
5.0000 mg | ORAL_TABLET | ORAL | Status: DC | PRN
  Administered 2022-04-23 – 2022-04-24 (×4): 5 mg via ORAL
  Filled 2022-04-22 (×4): qty 1

## 2022-04-22 MED ORDER — CARBOXYMETHYLCELLULOSE SODIUM 0.5 % OP SOLN: 1.0000 [drp] | Freq: Every day | OPHTHALMIC | Status: DC | PRN

## 2022-04-22 MED ORDER — PROCHLORPERAZINE EDISYLATE 10 MG/2ML IJ SOLN: 10.0000 mg | Freq: Four times a day (QID) | INTRAMUSCULAR | Status: DC | PRN

## 2022-04-22 MED ORDER — NICOTINE 14 MG/24HR TD PT24
14.0000 mg | MEDICATED_PATCH | Freq: Every day | TRANSDERMAL | Status: DC
  Administered 2022-04-22 – 2022-04-26 (×5): 14 mg via TRANSDERMAL
  Filled 2022-04-22 (×5): qty 1

## 2022-04-22 MED ORDER — ACETAMINOPHEN 650 MG RE SUPP: 650.0000 mg | Freq: Four times a day (QID) | RECTAL | Status: DC | PRN

## 2022-04-22 MED ORDER — SODIUM CHLORIDE 0.9 % IV BOLUS
1000.0000 mL | Freq: Once | INTRAVENOUS | Status: AC
  Administered 2022-04-22: 1000 mL via INTRAVENOUS

## 2022-04-22 MED ORDER — ONDANSETRON HCL 4 MG/2ML IJ SOLN
4.0000 mg | Freq: Once | INTRAMUSCULAR | Status: AC
  Administered 2022-04-22: 4 mg via INTRAVENOUS
  Filled 2022-04-22: qty 2

## 2022-04-22 MED ORDER — METHYLPREDNISOLONE SODIUM SUCC 40 MG IJ SOLR
40.0000 mg | Freq: Two times a day (BID) | INTRAMUSCULAR | Status: DC
  Administered 2022-04-22 – 2022-04-26 (×8): 40 mg via INTRAVENOUS
  Filled 2022-04-22 (×8): qty 1

## 2022-04-22 NOTE — ED Provider Notes (Signed)
[facility]-EMERGENCY DEPT Provider Note   CSN: [id number] Arrival date & time: 04/22/22  1115     History  Chief Complaint  Patient presents with   Abdominal Pain    Sandra Foster is a 31 y.o. female.  Patient with history of Crohn's disease, currently on Humira -- presents to the emergency department today for 3 days of worsening abdominal pain with nausea.  Pain is mainly right lateral abdomen right lower abdomen and left lower abdomen.  Symptoms have been gradually worsening.  Pain is severe.  She denies diarrhea or constipation.  She denies bloody or black stools.  No vomiting.  No fevers.  She has been taking over-the-counter medication at home without improvement.  She states that her symptoms feel the same as when she has had flares in the past.  She did speak with GI APP.  No fever.  Last hospitalization was March 2023.  At that time she was admitted for steroids.       Home Medications Prior to Admission medications   Medication Sig Start Date End Date Taking? Authorizing Provider  etonogestrel (NEXPLANON) 68 MG IMPL implant 1 each by Subdermal route once.    [provider]  sertraline (ZOLOFT) 25 MG tablet Take 1 tablet (25 mg total) by mouth daily. 01/13/22   Erick Alley, DO  amitriptyline (ELAVIL) 25 MG tablet Take 1-2 tablets (25-50 mg total) by mouth at bedtime as needed (headache). 03/25/20 06/18/20  Rodolph Bong, MD      Allergies    Patient has no known allergies.    Review of Systems   Review of Systems  Physical Exam Updated Vital Signs BP 121/68   Pulse 85   Temp 98.3 F (36.8 C) (Oral)   Resp 16   Ht 5\' 6"  (1.676 m)   Wt 62.1 kg   SpO2 99%   BMI 22.11 kg/m   Physical Exam Vitals and nursing note reviewed.  Constitutional:      General: She is not in acute distress.    Appearance: She is well-developed.  HENT:     Head: Normocephalic and atraumatic.     Right Ear: External ear normal.     Left Ear: External  ear normal.     Nose: Nose normal.  Eyes:     Conjunctiva/sclera: Conjunctivae normal.  Cardiovascular:     Rate and Rhythm: Normal rate and regular rhythm.     Heart sounds: No murmur heard. Pulmonary:     Effort: No respiratory distress.     Breath sounds: No wheezing, rhonchi or rales.  Abdominal:     Palpations: Abdomen is soft.     Tenderness: There is abdominal tenderness in the right upper quadrant, right lower quadrant and suprapubic area. There is no guarding or rebound. Negative signs include Murphy's sign and McBurney's sign.  Musculoskeletal:     Cervical back: Normal range of motion and neck supple.     Right lower leg: No edema.     Left lower leg: No edema.  Skin:    General: Skin is warm and dry.     Findings: No rash.  Neurological:     General: No focal deficit present.     Mental Status: She is alert. Mental status is at baseline.     Motor: No weakness.  Psychiatric:        Mood and Affect: Mood normal.     ED Results / Procedures / Treatments   Labs (all  labs ordered are listed, but only abnormal results are displayed) Labs Reviewed  COMPREHENSIVE METABOLIC PANEL - Abnormal; Notable for the following components:      Result Value   Glucose, Bld 112 (*)    Calcium 8.7 (*)    Albumin 3.4 (*)    AST 9 (*)    All other components within normal limits  CBC - Abnormal; Notable for the following components:   Hemoglobin 11.5 (*)    HCT 35.6 (*)    All other components within normal limits  LIPASE, BLOOD  URINALYSIS, ROUTINE W REFLEX MICROSCOPIC  SEDIMENTATION RATE  C-REACTIVE PROTEIN  I-STAT BETA HCG BLOOD, ED ([facility], [facility], [facility] ONLY)    EKG None  Radiology No results found.  Procedures Procedures    Medications Ordered in ED Medications  methylPREDNISolone sodium succinate (SOLU-MEDROL) 40 mg/mL injection 40 mg (40 mg Intravenous Given 04/22/22 1614)  0.9 %  sodium chloride infusion (has no administration in time range)  prochlorperazine  (COMPAZINE) injection 10 mg (has no administration in time range)  HYDROmorphone (DILAUDID) injection 0.5 mg (has no administration in time range)  HYDROmorphone (DILAUDID) injection 0.5 mg (0.5 mg Intravenous Given 04/22/22 1250)  ondansetron (ZOFRAN) injection 4 mg (4 mg Intravenous Given 04/22/22 1247)  sodium chloride 0.9 % bolus 1,000 mL (0 mLs Intravenous Stopped 04/22/22 1501)  HYDROmorphone (DILAUDID) injection 0.5 mg (0.5 mg Intravenous Given 04/22/22 1506)    ED Course/ Medical Decision Making/ A&P    Patient seen and examined. History obtained directly from patient.  I also reviewed previous hospitalization notes, colonoscopy results.  Labs/EKG: Independently reviewed and interpreted.  This included: CBC with normal white blood cell count 10.2, hemoglobin 11.5; CMP with normal electrolytes, normal kidney function, glucose 112; lipase normal; pregnancy negative.  Added ESR and CRP as this could be helpful for primary GI.  Imaging: Considered CT of the abdomen pelvis, however white blood cell count is reassuring and symptoms are consistent with previous flare of Crohn's ileitis.  We will continue to monitor but will defer imaging at this point.  Medications/Fluids: Ordered: IV Dilaudid, IV Zofran, IV fluid bolus   Most recent vital signs reviewed and are as follows: BP 121/68   Pulse 85   Temp 98.3 F (36.8 C) (Oral)   Resp 16   Ht 5\' 6"  (1.676 m)   Wt 62.1 kg   SpO2 99%   BMI 22.11 kg/m   Initial impression: Flare of Crohn's ileitis  I did touch base and consult with Neldon Mc of [facility] over the telephone.  Current plan is for symptom control.  They feel that it would be reasonable to discharge patient home on tapered course of steroids if symptoms are improved and patient is agreeable.  If symptoms are uncontrolled, would consider admission for IV steroids.  1:10 PM Reassessment performed. Patient appears more comfortable, states that the pain medication was  effective.  Reviewed pertinent lab work and imaging with patient at bedside. Questions answered.   Most current vital signs reviewed and are as follows: BP 121/68   Pulse 85   Temp 98.3 F (36.8 C) (Oral)   Resp 16   Ht 5\' 6"  (1.676 m)   Wt 62.1 kg   SpO2 99%   BMI 22.11 kg/m   Plan: Continue hydration, pain control, nausea control.  Will reassess and determine appropriate disposition.  Patient is in agreement with plan.  3:03 PM RN requested more pain medication.  4:21 PM Reassessment performed. Patient appears  mildly uncomfortable, but in no distress.  I discussed with patient how she wanted to proceed.  Offered trial of oral pain medication, Bentyl, and steroids, discharged home with same versus IV therapy and admission to the hospital.  Patient states that she does not currently feel well enough to go home and requests admission.  I again consulted with Neldon Mc for reccs.  Patient be started on Solu-Medrol 40 mg IV.  They recommend KUB, supine and standing, and VTE prophylaxis.  Plan for GI consult tomorrow.  Reviewed pertinent lab work and imaging with patient at bedside. Questions answered.   Most current vital signs reviewed and are as follows: BP (!) 99/56   Pulse 71   Temp 98.3 F (36.8 C) (Oral)   Resp 16   Ht 5\' 6"  (1.676 m)   Wt 62.1 kg   SpO2 99%   BMI 22.11 kg/m   Plan: Admit to hospital.   I consulted with Dr. Ronaldo Miyamoto of [facility] hospitalist by telephone who will see the patient for admission.                            Medical Decision Making Amount and/or Complexity of Data Reviewed Labs: ordered. Radiology: ordered.  Risk Prescription drug management.   For this patient's complaint of abdominal pain, the following conditions were considered on the differential diagnosis: gastritis/PUD, enteritis/duodenitis, appendicitis, cholelithiasis/cholecystitis, cholangitis, pancreatitis, ruptured viscus, colitis, diverticulitis, small/large bowel  obstruction, proctitis, cystitis, pyelonephritis, ureteral colic, aortic dissection, aortic aneurysm. In women, ectopic pregnancy, pelvic inflammatory disease, ovarian cysts, and tubo-ovarian abscess were also considered. Atypical chest etiologies were also considered including ACS, PE, and pneumonia.   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  Patient's symptoms are consistent with previous and she does not have significant leukocytosis or fever to suggest complication at this time.    The patient's vital signs, pertinent lab work and imaging were reviewed and interpreted as discussed in the ED course. Hospitalization was considered for further testing, treatments, or serial exams/observation. However as patient is well-appearing, has a stable exam, and reassuring studies today, I do not feel that they warrant admission at this time. This plan was discussed with the patient who verbalizes agreement and comfort with this plan and seems reliable and able to return to the Emergency Department with worsening or changing symptoms.          Final Clinical Impression(s) / ED Diagnoses Final diagnoses:  Crohn's disease of ileum without complication (HCC)  Abdominal pain, unspecified abdominal location    Rx / DC Orders ED Discharge Orders     None         Renne Crigler, PA-C 04/22/22 1624    Franne Forts, DO 04/23/22 [id number]

## 2022-04-22 NOTE — H&P (Signed)
History and Physical    Patient: Sandra Foster UJW:[id number] DOB: 1991-04-09 DOA: 04/22/2022 DOS: the patient was seen and examined on 04/22/2022 PCP: Patient, No Pcp Per  Patient coming from: Home  Chief Complaint:  Chief Complaint  Patient presents with   Abdominal Pain   HPI: Sandra Foster is a 31 y.o. female with medical history significant of crohn's disease. Presenting with abdominal pain. Symptoms started 3 days ago with severe cramping in the right lower abdomen. She felt nauseous, but she did not have any vomiting, diarrhea, or fever. There was no rectal bleeding. She did have dizziness and poor appetite, but she admits that she has been afraid to eat d/t the pain. She tried APAP and BC powder to help, but they did not provide relief. When her symptoms did not improve today, she decided to come to the ED for assistance. She denies any other aggravating or alleviating factors.    Review of Systems: As mentioned in the history of present illness. All other systems reviewed and are negative. Past Medical History:  Diagnosis Date   Chlamydia    Fetal demise    hyperglycemic hyperosmolar nonketonic syndrome   Hernia    Pregnancy induced hypertension    Pyelonephritis    Past Surgical History:  Procedure Laterality Date   BIOPSY  01/09/2022   Procedure: BIOPSY;  Surgeon: Jeani Hawking, MD;  Location: [facility] ENDOSCOPY;  Service: Gastroenterology;;   COLONOSCOPY WITH PROPOFOL N/A 01/09/2022   Procedure: COLONOSCOPY WITH PROPOFOL;  Surgeon: Jeani Hawking, MD;  Location: [facility] ENDOSCOPY;  Service: Gastroenterology;  Laterality: N/A;   HERNIA REPAIR     Social History:  reports that she has quit smoking. Her smoking use included cigarettes. She smoked an average of .25 packs per day. She quit smokeless tobacco use about 10 years ago. She reports that she does not drink alcohol and does not use drugs.  No Known Allergies  Family History  Problem Relation Age of Onset   Hypertension  Maternal Grandmother     Prior to Admission medications   Medication Sig Start Date End Date Taking? Authorizing Provider  acetaminophen (TYLENOL) 500 MG tablet Take 1,000 mg by mouth every 6 (six) hours as needed for mild pain.   Yes [provider]  Adalimumab (HUMIRA PEN) 40 MG/0.4ML PNKT Inject 40 mg into the skin every 14 (fourteen) days.   Yes [provider]  Aspirin-Caffeine (BC FAST PAIN RELIEF) 845-65 MG PACK Take 1 Package by mouth daily as needed (pain).   Yes [provider]  carboxymethylcellulose (REFRESH PLUS) 0.5 % SOLN Place 1 drop into both eyes daily as needed (dry eyes).   Yes [provider]  etonogestrel (NEXPLANON) 68 MG IMPL implant 1 each by Subdermal route once.    [provider]  sertraline (ZOLOFT) 25 MG tablet Take 1 tablet (25 mg total) by mouth daily. Patient not taking: Reported on 04/22/2022 01/13/22   Erick Alley, DO  amitriptyline (ELAVIL) 25 MG tablet Take 1-2 tablets (25-50 mg total) by mouth at bedtime as needed (headache). 03/25/20 06/18/20  Rodolph Bong, MD    Physical Exam: Vitals:   04/22/22 1459 04/22/22 1500 04/22/22 1530 04/22/22 1600  BP: 121/75 121/75 (!) 99/53 (!) 99/56  Pulse: 71 72 62 71  Resp: 16   16  Temp:      TempSrc:      SpO2: 99% 99% 99% 99%  Weight:      Height:  General: 31 y.o. female resting in bed in NAD Eyes: PERRL, normal sclera ENMT: Nares patent w/o discharge, orophaynx clear, dentition normal, ears w/o discharge/lesions/ulcers Neck: Supple, trachea midline Cardiovascular: RRR, +S1, S2, no m/g/r, equal pulses throughout Respiratory: CTABL, no w/r/r, normal WOB GI: BS+, ND, RLQ TTP, no masses noted, no organomegaly noted MSK: No e/c/c Neuro: A&O x 3, no focal deficits Psyc: Appropriate interaction and affect, calm/cooperative  Data Reviewed:  Na+  136 K+  3.6 CO2  23 BUN  10 Scr  0.59 Lipase  21 WBC  10.2 Hgb  11.5 Plt  37 ESR  31  Assessment and  Plan: Crohn's flare     - place in obs, med-surg     - EDPA spoke with [facility] who is covering for Dr. Loreta Ave; they recommended IV steroids, fluids, KUB and Dr. Loreta Ave will see in AM     - she has been started on IV solumedrol     - start NS, PRN compazine     - PRN pain control     - she is normally on humira; her next injection is coming up in the next couple of days  Tobacco abuse     - counseled against further use     - nicotine patch PRN  Advance Care Planning:   Code Status: FULL  Consults: [facility] for Dr. Loreta Ave  Family Communication: None at bedside  Severity of Illness: The appropriate patient status for this patient is OBSERVATION. Observation status is judged to be reasonable and necessary in order to provide the required intensity of service to ensure the patient's safety. The patient's presenting symptoms, physical exam findings, and initial radiographic and laboratory data in the context of their medical condition is felt to place them at decreased risk for further clinical deterioration. Furthermore, it is anticipated that the patient will be medically stable for discharge from the hospital within 2 midnights of admission.   Author: Teddy Spike, DO 04/22/2022 4:20 PM  For on call review [email or web address].

## 2022-04-22 NOTE — ED Triage Notes (Signed)
Patient brought in by [facility], pt coming from home. States she has chrons diseaase and has been having flare up over past few days. Reports normal BM this morning. Has been taking tylenol twice a day.

## 2022-04-22 NOTE — ED Notes (Signed)
Patient was given a clear soda for PO challenge.

## 2022-04-23 DIAGNOSIS — K5090 Crohn's disease, unspecified, without complications: Secondary | ICD-10-CM | POA: Diagnosis not present

## 2022-04-23 DIAGNOSIS — I959 Hypotension, unspecified: Secondary | ICD-10-CM | POA: Diagnosis not present

## 2022-04-23 DIAGNOSIS — D649 Anemia, unspecified: Secondary | ICD-10-CM | POA: Diagnosis not present

## 2022-04-23 DIAGNOSIS — Z720 Tobacco use: Secondary | ICD-10-CM

## 2022-04-23 DIAGNOSIS — Z79899 Other long term (current) drug therapy: Secondary | ICD-10-CM | POA: Diagnosis not present

## 2022-04-23 DIAGNOSIS — D509 Iron deficiency anemia, unspecified: Secondary | ICD-10-CM | POA: Diagnosis present

## 2022-04-23 DIAGNOSIS — E1165 Type 2 diabetes mellitus with hyperglycemia: Secondary | ICD-10-CM | POA: Diagnosis present

## 2022-04-23 DIAGNOSIS — Z3202 Encounter for pregnancy test, result negative: Secondary | ICD-10-CM | POA: Diagnosis present

## 2022-04-23 DIAGNOSIS — E119 Type 2 diabetes mellitus without complications: Secondary | ICD-10-CM | POA: Insufficient documentation

## 2022-04-23 DIAGNOSIS — Z8249 Family history of ischemic heart disease and other diseases of the circulatory system: Secondary | ICD-10-CM | POA: Diagnosis not present

## 2022-04-23 DIAGNOSIS — K5000 Crohn's disease of small intestine without complications: Secondary | ICD-10-CM | POA: Diagnosis not present

## 2022-04-23 DIAGNOSIS — Z79620 Long term (current) use of immunosuppressive biologic: Secondary | ICD-10-CM | POA: Diagnosis not present

## 2022-04-23 DIAGNOSIS — I9589 Other hypotension: Secondary | ICD-10-CM | POA: Diagnosis present

## 2022-04-23 DIAGNOSIS — F17210 Nicotine dependence, cigarettes, uncomplicated: Secondary | ICD-10-CM | POA: Diagnosis present

## 2022-04-23 LAB — CBC
HCT: 31.8 % — ABNORMAL LOW (ref 36.0–46.0)
Hemoglobin: 10.1 g/dL — ABNORMAL LOW (ref 12.0–15.0)
MCH: 28 pg (ref 26.0–34.0)
MCHC: 31.8 g/dL (ref 30.0–36.0)
MCV: 88.1 fL (ref 80.0–100.0)
Platelets: 340 10*3/uL (ref 150–400)
RBC: 3.61 MIL/uL — ABNORMAL LOW (ref 3.87–5.11)
RDW: 14.5 % (ref 11.5–15.5)
WBC: 9.1 10*3/uL (ref 4.0–10.5)
nRBC: 0 % (ref 0.0–0.2)

## 2022-04-23 LAB — COMPREHENSIVE METABOLIC PANEL
ALT: 7 U/L (ref 0–44)
AST: 7 U/L — ABNORMAL LOW (ref 15–41)
Albumin: 2.9 g/dL — ABNORMAL LOW (ref 3.5–5.0)
Alkaline Phosphatase: 54 U/L (ref 38–126)
Anion gap: 7 (ref 5–15)
BUN: 5 mg/dL — ABNORMAL LOW (ref 6–20)
CO2: 23 mmol/L (ref 22–32)
Calcium: 8.6 mg/dL — ABNORMAL LOW (ref 8.9–10.3)
Chloride: 109 mmol/L (ref 98–111)
Creatinine, Ser: 0.45 mg/dL (ref 0.44–1.00)
GFR, Estimated: >60 mL/min (ref >60)
Glucose, Bld: 150 mg/dL — ABNORMAL HIGH (ref 70–99)
Potassium: 4 mmol/L (ref 3.5–5.1)
Sodium: 139 mmol/L (ref 135–145)
Total Bilirubin: 0.3 mg/dL (ref 0.3–1.2)
Total Protein: 6.2 g/dL — ABNORMAL LOW (ref 6.5–8.1)

## 2022-04-23 LAB — GLUCOSE, CAPILLARY
Glucose-Capillary: 184 mg/dL — ABNORMAL HIGH (ref 70–99)
Glucose-Capillary: 144 mg/dL — ABNORMAL HIGH (ref 70–99)
Glucose-Capillary: 163 mg/dL — ABNORMAL HIGH (ref 70–99)

## 2022-04-23 LAB — IRON AND TIBC
Iron: 26 ug/dL — ABNORMAL LOW (ref 28–170)
Saturation Ratios: 9 % — ABNORMAL LOW (ref 10.4–31.8)
TIBC: 287 ug/dL (ref 250–450)
UIBC: 261 ug/dL

## 2022-04-23 LAB — FERRITIN: Ferritin: 111 ng/mL (ref 11–307)

## 2022-04-23 LAB — RETICULOCYTES
Immature Retic Fract: 4.4 % (ref 2.3–15.9)
RBC.: 3.81 MIL/uL — ABNORMAL LOW (ref 3.87–5.11)
Retic Count, Absolute: 44.6 10*3/uL (ref 19.0–186.0)
Retic Ct Pct: 1.2 % (ref 0.4–3.1)

## 2022-04-23 LAB — VITAMIN B12: Vitamin B-12: 275 pg/mL (ref 180–914)

## 2022-04-23 LAB — FOLATE: Folate: 9.3 ng/mL (ref >5.9)

## 2022-04-23 MED ORDER — INSULIN ASPART 100 UNIT/ML IJ SOLN
0.0000 [IU] | Freq: Three times a day (TID) | INTRAMUSCULAR | Status: DC
  Administered 2022-04-23: 2 [IU] via SUBCUTANEOUS
  Administered 2022-04-23: 3 [IU] via SUBCUTANEOUS
  Administered 2022-04-24: 2 [IU] via SUBCUTANEOUS
  Administered 2022-04-24: 5 [IU] via SUBCUTANEOUS

## 2022-04-23 MED ORDER — INSULIN ASPART 100 UNIT/ML IJ SOLN: 0.0000 [IU] | Freq: Every day | INTRAMUSCULAR | Status: DC

## 2022-04-23 NOTE — Progress Notes (Signed)
  Transition of Care (TOC) Screening Note   Patient Details  Name: ARUNA BERARDUCCI Date of Birth: [birth year]   Transition of Care [facility]) CM/SW Contact:    Amada Jupiter, LCSW Phone Number: 04/23/2022, 2:35 PM    Transition of Care Department [facility]) has reviewed patient and no TOC needs have been identified at this time. We will continue to monitor patient advancement through interdisciplinary progression rounds. If new patient transition needs arise, please place a TOC consult.

## 2022-04-23 NOTE — Plan of Care (Signed)

## 2022-04-24 DIAGNOSIS — D649 Anemia, unspecified: Secondary | ICD-10-CM | POA: Diagnosis not present

## 2022-04-24 DIAGNOSIS — D509 Iron deficiency anemia, unspecified: Secondary | ICD-10-CM

## 2022-04-24 DIAGNOSIS — I959 Hypotension, unspecified: Secondary | ICD-10-CM | POA: Diagnosis not present

## 2022-04-24 DIAGNOSIS — Z720 Tobacco use: Secondary | ICD-10-CM | POA: Diagnosis not present

## 2022-04-24 DIAGNOSIS — K5090 Crohn's disease, unspecified, without complications: Secondary | ICD-10-CM | POA: Diagnosis not present

## 2022-04-24 LAB — CBC
HCT: 31.1 % — ABNORMAL LOW (ref 36.0–46.0)
Hemoglobin: 9.8 g/dL — ABNORMAL LOW (ref 12.0–15.0)
MCH: 27.8 pg (ref 26.0–34.0)
MCHC: 31.5 g/dL (ref 30.0–36.0)
MCV: 88.4 fL (ref 80.0–100.0)
Platelets: 323 10*3/uL (ref 150–400)
RBC: 3.52 MIL/uL — ABNORMAL LOW (ref 3.87–5.11)
RDW: 14.5 % (ref 11.5–15.5)
WBC: 10.7 10*3/uL — ABNORMAL HIGH (ref 4.0–10.5)
nRBC: 0 % (ref 0.0–0.2)

## 2022-04-24 LAB — RENAL FUNCTION PANEL
Albumin: 2.7 g/dL — ABNORMAL LOW (ref 3.5–5.0)
Anion gap: 5 (ref 5–15)
BUN: <5 mg/dL — ABNORMAL LOW (ref 6–20)
CO2: 25 mmol/L (ref 22–32)
Calcium: 8.7 mg/dL — ABNORMAL LOW (ref 8.9–10.3)
Chloride: 111 mmol/L (ref 98–111)
Creatinine, Ser: 0.46 mg/dL (ref 0.44–1.00)
GFR, Estimated: >60 mL/min (ref >60)
Glucose, Bld: 137 mg/dL — ABNORMAL HIGH (ref 70–99)
Phosphorus: 3.8 mg/dL (ref 2.5–4.6)
Potassium: 4.2 mmol/L (ref 3.5–5.1)
Sodium: 141 mmol/L (ref 135–145)

## 2022-04-24 LAB — HEMOGLOBIN A1C
Hgb A1c MFr Bld: 11 % — ABNORMAL HIGH (ref 4.8–5.6)
Mean Plasma Glucose: 269 mg/dL

## 2022-04-24 LAB — GLUCOSE, CAPILLARY
Glucose-Capillary: 146 mg/dL — ABNORMAL HIGH (ref 70–99)
Glucose-Capillary: 241 mg/dL — ABNORMAL HIGH (ref 70–99)
Glucose-Capillary: 243 mg/dL — ABNORMAL HIGH (ref 70–99)
Glucose-Capillary: 180 mg/dL — ABNORMAL HIGH (ref 70–99)

## 2022-04-24 LAB — MAGNESIUM: Magnesium: 2.3 mg/dL (ref 1.7–2.4)

## 2022-04-24 MED ORDER — SODIUM CHLORIDE 0.9 % IV SOLN
250.0000 mg | Freq: Once | INTRAVENOUS | Status: AC
  Administered 2022-04-24: 250 mg via INTRAVENOUS
  Filled 2022-04-24: qty 20

## 2022-04-24 MED ORDER — INSULIN ASPART 100 UNIT/ML IJ SOLN
4.0000 [IU] | Freq: Three times a day (TID) | INTRAMUSCULAR | Status: DC
  Administered 2022-04-24 – 2022-04-25 (×3): 4 [IU] via SUBCUTANEOUS

## 2022-04-24 MED ORDER — LIVING WELL WITH DIABETES BOOK
Freq: Once | Status: AC
  Filled 2022-04-24: qty 1

## 2022-04-24 MED ORDER — INSULIN ASPART 100 UNIT/ML IJ SOLN: 0.0000 [IU] | Freq: Every day | INTRAMUSCULAR | Status: DC

## 2022-04-24 MED ORDER — OXYCODONE HCL 5 MG PO TABS
5.0000 mg | ORAL_TABLET | Freq: Three times a day (TID) | ORAL | Status: DC | PRN
  Administered 2022-04-25 (×2): 5 mg via ORAL
  Filled 2022-04-24 (×2): qty 1

## 2022-04-24 MED ORDER — INSULIN STARTER KIT- PEN NEEDLES (ENGLISH)
1.0000 | Freq: Once | Status: AC
  Administered 2022-04-24: 1
  Filled 2022-04-24: qty 1

## 2022-04-24 MED ORDER — INSULIN GLARGINE-YFGN 100 UNIT/ML ~~LOC~~ SOLN
5.0000 [IU] | Freq: Two times a day (BID) | SUBCUTANEOUS | Status: DC
  Administered 2022-04-24 – 2022-04-25 (×3): 5 [IU] via SUBCUTANEOUS
  Filled 2022-04-24 (×4): qty 0.05

## 2022-04-24 MED ORDER — INSULIN ASPART 100 UNIT/ML IJ SOLN
0.0000 [IU] | Freq: Three times a day (TID) | INTRAMUSCULAR | Status: DC
  Administered 2022-04-24: 5 [IU] via SUBCUTANEOUS
  Administered 2022-04-25: 3 [IU] via SUBCUTANEOUS
  Administered 2022-04-25: 5 [IU] via SUBCUTANEOUS
  Administered 2022-04-26: 3 [IU] via SUBCUTANEOUS

## 2022-04-25 DIAGNOSIS — E1165 Type 2 diabetes mellitus with hyperglycemia: Secondary | ICD-10-CM | POA: Diagnosis not present

## 2022-04-25 DIAGNOSIS — D509 Iron deficiency anemia, unspecified: Secondary | ICD-10-CM | POA: Diagnosis not present

## 2022-04-25 DIAGNOSIS — Z720 Tobacco use: Secondary | ICD-10-CM | POA: Diagnosis not present

## 2022-04-25 DIAGNOSIS — K5090 Crohn's disease, unspecified, without complications: Secondary | ICD-10-CM | POA: Diagnosis not present

## 2022-04-25 LAB — CBC
HCT: 30.9 % — ABNORMAL LOW (ref 36.0–46.0)
Hemoglobin: 9.9 g/dL — ABNORMAL LOW (ref 12.0–15.0)
MCH: 28.1 pg (ref 26.0–34.0)
MCHC: 32 g/dL (ref 30.0–36.0)
MCV: 87.8 fL (ref 80.0–100.0)
Platelets: 394 10*3/uL (ref 150–400)
RBC: 3.52 MIL/uL — ABNORMAL LOW (ref 3.87–5.11)
RDW: 14.5 % (ref 11.5–15.5)
WBC: 12.9 10*3/uL — ABNORMAL HIGH (ref 4.0–10.5)
nRBC: 0 % (ref 0.0–0.2)

## 2022-04-25 LAB — RENAL FUNCTION PANEL
Albumin: 2.8 g/dL — ABNORMAL LOW (ref 3.5–5.0)
Anion gap: 6 (ref 5–15)
BUN: 8 mg/dL (ref 6–20)
CO2: 26 mmol/L (ref 22–32)
Calcium: 8.8 mg/dL — ABNORMAL LOW (ref 8.9–10.3)
Chloride: 107 mmol/L (ref 98–111)
Creatinine, Ser: 0.49 mg/dL (ref 0.44–1.00)
GFR, Estimated: >60 mL/min (ref >60)
Glucose, Bld: 186 mg/dL — ABNORMAL HIGH (ref 70–99)
Phosphorus: 3.2 mg/dL (ref 2.5–4.6)
Potassium: 4.1 mmol/L (ref 3.5–5.1)
Sodium: 139 mmol/L (ref 135–145)

## 2022-04-25 LAB — MAGNESIUM: Magnesium: 2.2 mg/dL (ref 1.7–2.4)

## 2022-04-25 LAB — GLUCOSE, CAPILLARY
Glucose-Capillary: 156 mg/dL — ABNORMAL HIGH (ref 70–99)
Glucose-Capillary: 223 mg/dL — ABNORMAL HIGH (ref 70–99)
Glucose-Capillary: 87 mg/dL (ref 70–99)

## 2022-04-25 MED ORDER — INSULIN ASPART 100 UNIT/ML IJ SOLN
6.0000 [IU] | Freq: Three times a day (TID) | INTRAMUSCULAR | Status: DC
  Administered 2022-04-26: 6 [IU] via SUBCUTANEOUS

## 2022-04-25 MED ORDER — INSULIN GLARGINE-YFGN 100 UNIT/ML ~~LOC~~ SOLN
10.0000 [IU] | Freq: Two times a day (BID) | SUBCUTANEOUS | Status: DC
  Administered 2022-04-25 – 2022-04-26 (×2): 10 [IU] via SUBCUTANEOUS
  Filled 2022-04-25 (×3): qty 0.1

## 2022-04-25 MED ORDER — POLYETHYLENE GLYCOL 3350 17 G PO PACK: 17.0000 g | PACK | Freq: Two times a day (BID) | ORAL | Status: DC | PRN

## 2022-04-25 NOTE — Progress Notes (Signed)
Subjective: Since I last evaluated the patient, she seems to be doing fairly well on IV steroids.  She had 1 normal volume bowel movement today with any blood or mucus in the stool.  She denies having any abdominal pain nausea vomiting at the present time.    Objective: Vital signs in last 24 hours: Temp:  [98.4 F (36.9 C)-98.6 F (37 C)] 98.5 F (36.9 C) (06/28 1316) Pulse Rate:  [58-68] 68 (06/28 1316) Resp:  [13-16] 16 (06/28 1316) BP: (100-111)/(65-89) 100/65 (06/28 1316) SpO2:  [99 %] 99 % (06/28 1316) Last BM Date : 04/24/22  Intake/Output from previous day: 06/27 0701 - 06/28 0700 In: 3712.3 [P.O.:1800; I.V.:1640.1; IV Piggyback:272.2] Out: -  Intake/Output this shift: Total I/O In: 120 [P.O.:120] Out: -   General appearance: alert, cooperative, appears stated age, and no distress Resp: clear to auscultation bilaterally Cardio: regular rate and rhythm, S1, S2 normal, no murmur, click, rub or gallop GI: soft, non-tender; bowel sounds normal; no masses,  no organomegaly  Lab Results: Recent Labs    04/23/22 0437 04/24/22 0423 04/25/22 0423  WBC 9.1 10.7* 12.9*  HGB 10.1* 9.8* 9.9*  HCT 31.8* 31.1* 30.9*  PLT 340 323 394   BMET Recent Labs    04/23/22 0437 04/24/22 0423 04/25/22 0423  NA 139 141 139  K 4.0 4.2 4.1  CL 109 111 107  CO2 23 25 26   GLUCOSE 150* 137* 186*  BUN 5* <5* 8  CREATININE 0.45 0.46 0.49  CALCIUM 8.6* 8.7* 8.8*   LFT Recent Labs    04/23/22 0437 04/24/22 0423 04/25/22 0423  PROT 6.2*  --   --   ALBUMIN 2.9*   < > 2.8*  AST 7*  --   --   ALT 7  --   --   ALKPHOS 54  --   --   BILITOT 0.3  --   --    < > = values in this interval not displayed.   PT/INR No results for input(s): "LABPROT", "INR" in the last 72 hours. Hepatitis Panel No results for input(s): "HEPBSAG", "HCVAB", "HEPAIGM", "HEPBIGM" in the last 72 hours. C-Diff No results for input(s): "CDIFFTOX" in the last 72 hours. No results for input(s): "CDIFFPCR" in  the last 72 hours. Fecal Lactopherrin No results for input(s): "FECLLACTOFRN" in the last 72 hours.  Studies/Results: No results found.  Medications: I have reviewed the patient's current medications. Prior to Admission:  Medications Prior to Admission  Medication Sig Dispense Refill Last Dose   acetaminophen (TYLENOL) 500 MG tablet Take 1,000 mg by mouth every 6 (six) hours as needed for mild pain.   04/22/2022   Adalimumab (HUMIRA PEN) 40 MG/0.4ML PNKT Inject 40 mg into the skin every 14 (fourteen) days.   04/17/2022   Aspirin-Caffeine (BC FAST PAIN RELIEF) 845-65 MG PACK Take 1 Package by mouth daily as needed (pain).   04/21/2022   carboxymethylcellulose (REFRESH PLUS) 0.5 % SOLN Place 1 drop into both eyes daily as needed (dry eyes).   Past Week   etonogestrel (NEXPLANON) 68 MG IMPL implant 1 each by Subdermal route once.      sertraline (ZOLOFT) 25 MG tablet Take 1 tablet (25 mg total) by mouth daily. (Patient not taking: Reported on 04/22/2022) 30 tablet 0 Not Taking   Scheduled:  insulin aspart  0-15 Units Subcutaneous TID WC   insulin aspart  0-5 Units Subcutaneous QHS   insulin aspart  6 Units Subcutaneous TID WC   insulin  glargine-yfgn  10 Units Subcutaneous BID   methylPREDNISolone (SOLU-MEDROL) injection  40 mg Intravenous Q12H   nicotine  14 mg Transdermal Daily   Continuous:  Assessment/Plan: Terminal ileitis/Crohn's disease of the small bowel-patient can be switched to prednisone tomorrow morning and be discharged on this dose with plans to see [place] in the office in the next 7 to 10 days. She has been  strongly urged against the use of all nonsteroidals including BC powders I have advised her that she does not need any medications for pain on an outpatient basis. The importance of smoking cessation, with regards to Crohn's disease has been emphasized as well.  LOS: 2 days   Sandra Foster 04/25/2022, 3:42 PM

## 2022-04-25 NOTE — TOC Progression Note (Signed)
Transition of Care [facility]) - Progression Note    Patient Details  Name: Sandra Foster MRN: [id number] Date of Birth: [birth year]  [facility]) CM/SW Contact  Lennart Pall, LCSW Phone Number: 04/25/2022, 10:32 AM  Clinical Narrative:    Received TOC referral to assist with PCP for pt.  Met with pt and explained that, with her Medicaid coverage, she should have an MD assigned.  She understands that this info may be on her Medicaid card and, if not, she needs to contact her Medicaid Case manager.  Pt understands and will follow up on her own.        Expected Discharge Plan and Services                                                 Social Determinants of Health (SDOH) Interventions    Readmission Risk Interventions    04/23/2022    2:36 PM  Readmission Risk Prevention Plan  Transportation Screening Complete  PCP or Specialist Appt within 5-7 Days Complete  Home Care Screening Complete  Medication Review (RN CM) Complete

## 2022-04-25 NOTE — Progress Notes (Signed)
Inpatient Diabetes Program Recommendations  AACE/[place]: New Consensus Statement on Inpatient Glycemic Control (2015)  Target Ranges:  Prepandial:   less than 140 mg/dL      Peak postprandial:   less than 180 mg/dL (1-2 hours)      Critically ill patients:  140 - 180 mg/dL   Lab Results  Component Value Date   GLUCAP 156 (H) 04/25/2022   HGBA1C 11.0 (H) 04/23/2022    Review of Glycemic Control  Diabetes history: New-onset DM, Hx GDM Outpatient Diabetes medications: None Current orders for Inpatient glycemic control: Semglee 5 units BID, Novolog 0-15 units TID with meals and 0-5 HS + 4 units TID  On Solumedrol 40 Q12H HgbA1C - 11% 156 mg/dL this am.  Inpatient Diabetes Program Recommendations:    For discharge: (Pt has no insurance coverage)  Novolin ReliOn 70/30 Flexpen 8 units BID (#[id number]) Insulin pen needles (#[id number]) Blood glucose meter kit (#[id number])  Will need PCP to manage her diabetes. Ordered TOC consult.  Spoke with pt at bedside regarding new diagnosis of Type 2 DM. Pt had GDM and has taken insulin in the past. Has lost approx 20 pounds in the past 2 months. Discussed A1C results (11%) and explained what an A1C is and informed patient that his current A1C indicates an average glucose of 269 mg/dl over the past 2-3 months. Discussed basic pathophysiology of DM Type 2, basic home care, importance of checking CBGs and maintaining good CBG control to prevent long-term and short-term complications. Reviewed glucose and A1C goals and explained that patient will need to continue to monitor blood sugars at least 3-4x/day. Reviewed signs and symptoms of hyperglycemia and hypoglycemia along with treatment for both. Discussed impact of nutrition, exercise, stress, sickness, and medications on diabetes control. Reviewed Living Well with diabetes booklet and encouraged patient to read through entire book. Informed patient that she may be prescribed Novolin 70/30 since it is more  affordable. Informed patient that Novolin 70/30 pens can be purchased at [facility] for $44 (includes 5 pens).  Pt seems motivated to control her diabetes to reduce risks of complications. Has a lot of family support.   Continue to follow.  Thank you. Lorenda Peck, RD, LDN, CDE Inpatient Diabetes Coordinator [phone]

## 2022-04-25 NOTE — Plan of Care (Signed)

## 2022-04-25 NOTE — Progress Notes (Signed)
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 intake and admitted for acute Crohn's flareup.  CMP and CBC without significant finding.  UA negative.  Pregnancy test negative.  CRP 13.8.  ESR 31.  Abdominal x-ray negative.  LGI consulted.  Patient was started on IV Solu-Medrol and admitted for acute Crohn's flareup.  [facility] following.  Has uncontrolled diabetes with A1c of 11%.  Started on insulin.  Subjective: Seen and examined earlier this morning.  No major events overnight of this morning.  Reports mild lower abdominal pain.  She rates her pain 3/10.  Worse after eating.  Denies nausea or vomiting.  Denies UTI symptoms.  Objective: Vitals:   04/24/22 1307 04/24/22 2237 04/25/22 0632 04/25/22 1316  BP: 101/86 104/89 111/66 100/65  Pulse: 61 (!) 58 (!) 58 68  Resp: _0 Temp:  98.4 F (36.9 C) 98.6 F (37 C) 98.5 F (36.9 C)  TempSrc:  Oral Oral Oral  SpO2: 100% 99% 99% 99%  Weight:      Height:        Examination: GENERAL: No apparent distress.  Nontoxic. HEENT: MMM.  Vision and hearing grossly intact.  NECK: Supple.  No apparent JVD.  RESP:  No IWOB.  Fair aeration bilaterally. CVS:  RRR. Heart sounds normal.  ABD/GI/GU: BS+. Abd soft, NTND.  MSK/EXT:  Moves extremities. No apparent deformity. No edema.  SKIN: no apparent skin lesion or wound NEURO: Awake and alert. Oriented appropriately.  No apparent focal neuro deficit. PSYCH: Calm. Normal affect.   Procedures:  None  Microbiology summarized: None  Assessment and  plan: Principal Problem:   Uncontrolled NIDDM-2 with hyperglycemia Active Problems:   Crohn's disease ([place])   Tobacco abuse   Normocytic anemia   Hypotension   Iron deficiency anemia  Crohn's flare: Recently diagnosed in 12/2021.  On Humira injection.  Presents with severe RLQ cramping pain, poor p.o. intake, nausea and dizziness.  She was also slightly hypotensive.  CMP and CBC without significant finding.  CRP 13.8.  ESR 31.  Symptoms improved with IV Solu-Medrol and IV fluid. -Continue IV Solu-Medrol 40 mg twice daily -Would like GI to specify taper course -Advised to minimize opiate use.  Decreased oxycodone to every 8 hours as needed -[place] modified diet. -Discontinue IV fluid  Hypotension/lightheadedness: Resolved.  Ambulating in the hallway independently. -Discontinue IV fluid  Uncontrolled NIDDM-2 with hyperglycemia: A1c 11.0% (6.3% about 3 months ago).  Not on medication at home.  Reports history of gestational diabetes and using insulin in the past. Recent Labs  Lab 04/24/22 1140 04/24/22 1658 04/24/22 2121 04/25/22 0713 04/25/22 1133  GLUCAP 241* 243* 180* 156* 223*  -Continue SSI-moderate -Increase NovoLog from 4 to 6 units 3 times daily -Increase Semglee to 10 units twice daily -Carb modified diet. -Consulted diabetic coordinator  Iron deficiency anemia: Drop in Hgb likely dilutional.  Iron sat 9%. Recent Labs    12/15/21 1136 01/08/22 1120 01/09/22 0331 01/10/22 0131 01/11/22 0055 04/22/22 1133 04/23/22 0437 04/24/22 0423 04/25/22 0423  HGB 13.6 12.1  11.3* 12.2 11.3* 11.5* 10.1* 9.8* 9.9*  -IV ferric gluconate 250 mg on 6/27. -Monitor H&H  Tobacco use disorder -Smoking cessation -Nicotine patch as needed  Leukocytosis/bandemia: Likely demargination from steroid.  Body mass index is 22.11 kg/m.          DVT prophylaxis:  SCDs Start: 04/22/22 1724  Code Status: Full code Family Communication: None at bedside. Level of care:  Med-Surg Status is: Inpatient Remains inpatient appropriate because: Crohn's flare   Final disposition: Home in the next 24 to 48 hours once cleared by GI. Consultants:  Gastroenterology  Sch Meds:  Scheduled Meds:  insulin aspart  0-15 Units Subcutaneous TID WC   insulin aspart  0-5 Units Subcutaneous QHS   insulin aspart  6 Units Subcutaneous TID WC   insulin glargine-yfgn  10 Units Subcutaneous BID   methylPREDNISolone (SOLU-MEDROL) injection  40 mg Intravenous Q12H   nicotine  14 mg Transdermal Daily   Continuous Infusions:   PRN Meds:.acetaminophen **OR** acetaminophen, HYDROmorphone (DILAUDID) injection, oxyCODONE, polyethylene glycol, polyvinyl alcohol, prochlorperazine  Antimicrobials: Anti-infectives (From admission, onward)    None        I have personally reviewed the following labs and images: CBC: Recent Labs  Lab 04/22/22 1133 04/23/22 0437 04/24/22 0423 04/25/22 0423  WBC 10.2 9.1 10.7* 12.9*  HGB 11.5* 10.1* 9.8* 9.9*  HCT 35.6* 31.8* 31.1* 30.9*  MCV 87.9 88.1 88.4 87.8  PLT 337 340 323 394   BMP &GFR Recent Labs  Lab 04/22/22 1133 04/23/22 0437 04/24/22 0423 04/25/22 0423  NA 136 139 141 139  K 3.6 4.0 4.2 4.1  CL 106 109 111 107  CO2 _0 GLUCOSE 112* 150* 137* 186*  BUN 10 5* <5* 8  CREATININE 0.59 0.45 0.46 0.49  CALCIUM 8.7* 8.6* 8.7* 8.8*  MG  --   --  2.3 2.2  PHOS  --   --  3.8 3.2   Estimated Creatinine Clearance: 96.3 mL/min (by C-G formula based on SCr of 0.49 mg/dL). Liver & Pancreas: Recent Labs  Lab 04/22/22 1133 04/23/22 0437 04/24/22 0423 04/25/22 0423  AST 9* 7*  --   --   ALT 8 7  --   --   ALKPHOS 62 54  --   --   BILITOT 0.4 0.3  --   --   PROT 7.1 6.2*  --   --   ALBUMIN 3.4* 2.9* 2.7* 2.8*   Recent Labs  Lab 04/22/22 1133  LIPASE 21   No results for input(s): "AMMONIA" in the last 168 hours. Diabetic: Recent Labs    04/23/22 1112  HGBA1C 11.0*   Recent Labs  Lab 04/24/22 1140  04/24/22 1658 04/24/22 2121 04/25/22 0713 04/25/22 1133  GLUCAP 241* 243* 180* 156* 223*   Cardiac Enzymes: No results for input(s): "CKTOTAL", "CKMB", "CKMBINDEX", "TROPONINI" in the last 168 hours. No results for input(s): "PROBNP" in the last 8760 hours. Coagulation Profile: No results for input(s): "INR", "PROTIME" in the last 168 hours. Thyroid Function Tests: No results for input(s): "TSH", "T4TOTAL", "FREET4", "T3FREE", "THYROIDAB" in the last 72 hours. Lipid Profile: No results for input(s): "CHOL", "HDL", "LDLCALC", "TRIG", "CHOLHDL", "LDLDIRECT" in the last 72 hours. Anemia Panel: Recent Labs    04/23/22 1112  VITAMINB12 275  FOLATE 9.3  FERRITIN 111  TIBC 287  IRON 26*  RETICCTPCT 1.2   Urine analysis:    Component Value Date/Time   COLORURINE YELLOW 04/22/2022 1127   APPEARANCEUR HAZY (A) 04/22/2022  1127   LABSPEC 1.017 04/22/2022 1127   PHURINE 6.0 04/22/2022 1127   GLUCOSEU NEGATIVE 04/22/2022 1127   HGBUR NEGATIVE 04/22/2022 1127   BILIRUBINUR NEGATIVE 04/22/2022 1127   KETONESUR 5 (A) 04/22/2022 1127   PROTEINUR NEGATIVE 04/22/2022 1127   UROBILINOGEN 0.2 08/11/2015 2134   NITRITE NEGATIVE 04/22/2022 1127   LEUKOCYTESUR NEGATIVE 04/22/2022 1127   Sepsis Labs: Invalid input(s): "PROCALCITONIN", "LACTICIDVEN"  Microbiology: No results found for this or any previous visit (from the past 240 hour(s)).  Radiology Studies: No results found.    Neosha Switalski T. [place]  If 7PM-7AM, please contact night-coverage [email or web address] 04/25/2022, 3:16 PM

## 2022-04-26 DIAGNOSIS — D509 Iron deficiency anemia, unspecified: Secondary | ICD-10-CM | POA: Diagnosis not present

## 2022-04-26 DIAGNOSIS — E1165 Type 2 diabetes mellitus with hyperglycemia: Secondary | ICD-10-CM | POA: Diagnosis not present

## 2022-04-26 DIAGNOSIS — Z720 Tobacco use: Secondary | ICD-10-CM | POA: Diagnosis not present

## 2022-04-26 DIAGNOSIS — K5090 Crohn's disease, unspecified, without complications: Secondary | ICD-10-CM | POA: Diagnosis not present

## 2022-04-26 LAB — CBC
HCT: 32.1 % — ABNORMAL LOW (ref 36.0–46.0)
Hemoglobin: 10.3 g/dL — ABNORMAL LOW (ref 12.0–15.0)
MCH: 28 pg (ref 26.0–34.0)
MCHC: 32.1 g/dL (ref 30.0–36.0)
MCV: 87.2 fL (ref 80.0–100.0)
Platelets: 407 10*3/uL — ABNORMAL HIGH (ref 150–400)
RBC: 3.68 MIL/uL — ABNORMAL LOW (ref 3.87–5.11)
RDW: 14.2 % (ref 11.5–15.5)
WBC: 13.5 10*3/uL — ABNORMAL HIGH (ref 4.0–10.5)
nRBC: 0 % (ref 0.0–0.2)

## 2022-04-26 LAB — GLUCOSE, CAPILLARY: Glucose-Capillary: 163 mg/dL — ABNORMAL HIGH (ref 70–99)

## 2022-04-26 MED ORDER — PEN NEEDLES 30G X 5 MM MISC: 1.0000 | Freq: Three times a day (TID) | 1 refills | Status: DC

## 2022-04-26 MED ORDER — PANTOPRAZOLE SODIUM 40 MG PO TBEC: 40.0000 mg | DELAYED_RELEASE_TABLET | Freq: Every day | ORAL | 0 refills | Status: DC

## 2022-04-26 MED ORDER — PREDNISONE 20 MG PO TABS
40.0000 mg | ORAL_TABLET | Freq: Every day | ORAL | 0 refills | Status: AC
Start: 2022-04-27 — End: 2022-05-12

## 2022-04-26 MED ORDER — BLOOD GLUCOSE METER KIT: PACK | 0 refills | Status: DC

## 2022-04-26 MED ORDER — INSULIN ASPART 100 UNIT/ML FLEXPEN: PEN_INJECTOR | SUBCUTANEOUS | 11 refills | Status: DC

## 2022-04-26 MED ORDER — PREDNISONE 20 MG PO TABS: 40.0000 mg | ORAL_TABLET | Freq: Every day | ORAL | Status: DC

## 2022-04-26 MED ORDER — INSULIN GLARGINE-YFGN 100 UNIT/ML ~~LOC~~ SOLN: 15.0000 [IU] | Freq: Two times a day (BID) | SUBCUTANEOUS | 11 refills | Status: DC

## 2022-04-26 MED ORDER — INSULIN ASPART 100 UNIT/ML IJ SOLN
0.0000 [IU] | Freq: Three times a day (TID) | INTRAMUSCULAR | 11 refills | Status: DC
Start: 2022-04-26 — End: 2022-04-26

## 2022-04-26 MED ORDER — BASAGLAR KWIKPEN 100 UNIT/ML ~~LOC~~ SOPN: 15.0000 [IU] | PEN_INJECTOR | Freq: Two times a day (BID) | SUBCUTANEOUS | 1 refills | Status: DC

## 2022-04-26 MED ORDER — INSULIN GLARGINE 100 UNIT/ML SOLOSTAR PEN: 15.0000 [IU] | PEN_INJECTOR | Freq: Two times a day (BID) | SUBCUTANEOUS | 2 refills | Status: DC

## 2022-04-26 MED ORDER — NICOTINE 14 MG/24HR TD PT24: 14.0000 mg | MEDICATED_PATCH | Freq: Every day | TRANSDERMAL | 0 refills | Status: DC

## 2022-04-26 NOTE — Progress Notes (Signed)
Discharge instructions given to patient and all questions were answered.  

## 2022-04-26 NOTE — Discharge Summary (Signed)
Physician Discharge Summary  Sandra Foster ZOX:[id number] DOB: [birth year] DOA: 04/22/2022  PCP: Patient, No Pcp Per.   Admit date: 04/22/2022 Discharge date: 04/26/2022 Admitted From: Home Disposition: Home Recommendations for Outpatient Follow-up:  Follow up with PCP in 1 to 2 weeks recommended Follow-up with [facility] Please obtain CBC and CMP at follow-up Reassess glycemic control and adjust insulin as appropriate May consider oral antihyperglycemic agent at follow-up. 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 intake and admitted for acute Crohn's flareup.  CMP and CBC without significant finding.  UA negative.  Pregnancy test negative.  CRP 13.8.  ESR 31.  Abdominal x-ray negative.  LGI consulted.  Patient was started on IV Solu-Medrol and admitted for acute Crohn's flareup.   [facility] consulted and evaluated patient.  Patient's symptoms resolved with IV steroid.  She was cleared for outpatient follow-up by GI.  She is discharged on p.o. prednisone 40 mg daily until follow-up with GI in 10 to 14 days.  GI to taper down steroid at follow-up.   Patient noted to have uncontrolled diabetes with A1c of 11%.  Started on insulin.  Reassess glycemic control at follow-up.  May consider oral agents and statin at follow-up.  See individual problem list below for more.   Problems addressed during this hospitalization Principal Problem:   Uncontrolled NIDDM-2 with hyperglycemia Active Problems:   Crohn's disease ([facility])   Tobacco abuse   Normocytic anemia   Hypotension   Iron deficiency anemia    Crohn's flare:  Recently diagnosed in 12/2021.  On Humira injection.  Presents with severe RLQ pain.  CRP 13.8.  ESR 31.  Symptoms resolved with IV Solu-Medrol. -Discharged on p.o. prednisone 40 mg daily until follow-up with GI in 10 to 14 days -GI to taper off steroid outpatient. -Encouraged to quit smoking cigarettes and avoid NSAIDs.   Hypotension/lightheadedness: Resolved.  Ambulating in the hallway independently.   Uncontrolled NIDDM-2 with hyperglycemia: A1c 11.0% (6.3% about 3 months ago).  Not on medication at home.  Reports history of gestational diabetes and using insulin in the past. Recent Labs  Lab 04/24/22 2121 04/25/22 0713 04/25/22 1133 04/25/22 1638 04/26/22 0722  GLUCAP 180* 156* 223* 87 163*  -Discharged on Lantus insulin 15 units twice daily and SSI-moderate based on a 24-hour requirement -Diabetic supplies ordered. -May consider oral agents and statin at follow-up.   Iron deficiency anemia: Drop in Hgb likely dilutional.  Iron sat 9%.  H&H stable. Recent Labs    12/15/21 1136 01/08/22 1120 01/09/22 0331 01/10/22 0131 01/11/22 0055 04/22/22 1133 04/23/22 0437 04/24/22 0423 04/25/22 0423 04/26/22 0421  HGB 13.6 12.1 11.3* 12.2 11.3* 11.5* 10.1* 9.8* 9.9* 10.3*  -IV ferric gluconate 250 mg on 6/27. -Recheck CBC at follow-up   Tobacco use disorder -Discussed the importance of smoking cessation.   Leukocytosis/bandemia: Likely demargination from steroid.            Vital signs Vitals:   04/25/22 0632 04/25/22 1316 04/25/22 2043 04/26/22 0633  BP: 111/66 100/65 108/62 116/83  Pulse: (!) 58 68 78 (!) 50  Temp: 98.6 F (37 C) 98.5 F (36.9 C) 98.6 F (37 C)  98.3 F (36.8 C)  Resp: _0 Height:      Weight:      SpO2: 99% 99% 100% 100%  TempSrc: Oral Oral Oral Oral  BMI (Calculated):         Discharge exam  GENERAL: No apparent distress.  Nontoxic. HEENT: MMM.  Vision and hearing grossly intact.  NECK: Supple.  No apparent JVD.  RESP:  No  IWOB.  Fair aeration bilaterally. CVS:  RRR. Heart sounds normal.  ABD/GI/GU: BS+. Abd soft, NTND.  MSK/EXT:  Moves extremities. No apparent deformity. No edema.  SKIN: no apparent skin lesion or wound NEURO: Awake and alert. Oriented appropriately.  No apparent focal neuro deficit. PSYCH: Calm. Normal affect.   Discharge Instructions Discharge Instructions     Call MD for:  extreme fatigue   Complete by: As directed    Call MD for:  persistant nausea and vomiting   Complete by: As directed    Call MD for:  severe uncontrolled pain   Complete by: As directed    Call MD for:  temperature >100.4   Complete by: As directed    Diet Carb Modified   Complete by: As directed    Discharge instructions   Complete by: As directed    It has been a pleasure taking care of you!  You were hospitalized due to Crohn's flareup and uncontrolled diabetes.  Your symptoms improved with steroid.  We are discharging you on prednisone.  Please call your gastroenterologist office and set up a follow-up appointment within the next 2 weeks.  We have started you on insulin for diabetes.  It is very important to have your diabetes under good control.  The goal is to keep your blood glucose between 100-180 (preferably about 120).  Please check your blood glucose about 15 minutes before you main meals and keep blood glucose log.  Follow-up with your primary care doctor in 1 to 2 weeks or sooner if needed.  It is important that you quit smoking cigarettes.  You may use nicotine patch to help you quit smoking.  Nicotine patch is available over-the-counter.  You may also discuss other options to help you quit smoking with your primary care doctor. You can also talk to professional counselors at 1-800-QUIT-NOW [phone]) for free smoking cessation counseling.     Take care,   Increase activity slowly   Complete by: As directed       Allergies as of 04/26/2022   No Known Allergies      Medication List      STOP taking these medications    BC Fast Pain Relief 845-65 MG Pack Generic drug: Aspirin-Caffeine       TAKE these medications    acetaminophen 500 MG tablet Commonly known as: TYLENOL Take 1,000 mg by mouth every 6 (six) hours as needed for mild pain.   blood glucose meter kit and supplies Dispense based on patient and insurance preference. Use up to four times daily as directed. (FOR ICD-10 E10.9, E11.9).   carboxymethylcellulose 0.5 % Soln Commonly known as: REFRESH PLUS Place 1 drop into both eyes daily as needed (dry eyes).   Humira Pen 40 MG/0.4ML Pnkt Generic drug: Adalimumab Inject 40 mg into the skin every 14 (fourteen) days.   insulin aspart 100 UNIT/ML FlexPen Commonly known as: NOVOLOG CBG < 70: Implement Hypoglycemia Standing Orders and refer to Hypoglycemia Standing Orders sidebar report CBG 70 - 120: 0 units CBG 121 - 150: 2 units  CBG 151 - 200: 3 units CBG 201 - 250: 5 units CBG 251 - 300: 8 units CBG 301 - 350: 11 units CBG 351 - 400: 15 units CBG > 400: call MD and obtain STAT lab verification   insulin glargine 100 UNIT/ML Solostar Pen Commonly known as: LANTUS Inject 15 Units into the skin 2 (two) times daily.   Nexplanon 68 MG Impl implant Generic drug: etonogestrel 1 each by Subdermal route once.   nicotine 14 mg/24hr patch Commonly known as: NICODERM CQ - dosed in mg/24 hours Place 1 patch (14 mg total) onto the skin daily.   pantoprazole 40 MG tablet Commonly known as: Protonix Take 1 tablet (40 mg total) by mouth daily.   Pen Needles 30G X 5 MM Misc 1 Pen by Does not apply route 4 (four) times daily -  before meals and at bedtime.   predniSONE 20 MG tablet Commonly known as: DELTASONE Take 2 tablets (40 mg total) by mouth daily with breakfast for 15 days. Start taking on: April 27, 2022   sertraline 25 MG tablet Commonly known as: ZOLOFT Take 1 tablet (25 mg total) by mouth daily.         Consultations: Gastroenterology  Procedures/Studies:   DG Abd 2 Views  Result Date: 04/22/2022 CLINICAL DATA:  History of Crohn's disease. Currently on Humira. Worsening abdominal pain. EXAM: ABDOMEN - 2 VIEW COMPARISON:  None Available. FINDINGS: The bowel gas pattern is normal. There is no evidence of free air. No radio-opaque calculi or other significant radiographic abnormality is seen. IMPRESSION: Negative. Electronically Signed   By: Keane Police D.O.   On: 04/22/2022 16:46       The results of significant diagnostics from this hospitalization (including imaging, microbiology, ancillary and laboratory) are listed below for reference.     Microbiology: No results found for this or any previous visit (from the past 240 hour(s)).   Labs:  CBC: Recent Labs  Lab 04/22/22 1133 04/23/22 0437 04/24/22 0423 04/25/22 0423 04/26/22 0421  WBC 10.2 9.1 10.7* 12.9* 13.5*  HGB 11.5* 10.1* 9.8* 9.9* 10.3*  HCT 35.6* 31.8* 31.1* 30.9* 32.1*  MCV 87.9 88.1 88.4 87.8 87.2  PLT 337 340 323 394 407*   BMP &GFR Recent Labs  Lab 04/22/22 1133 04/23/22 0437 04/24/22 0423 04/25/22 0423  NA 136 139 141 139  K 3.6 4.0 4.2 4.1  CL 106 109 111 107  CO2 _0 GLUCOSE 112* 150* 137* 186*  BUN 10 5* <5* 8  CREATININE 0.59 0.45 0.46 0.49  CALCIUM 8.7* 8.6* 8.7* 8.8*  MG  --   --  2.3 2.2  PHOS  --   --  3.8 3.2   Estimated Creatinine Clearance: 96.3 mL/min (by C-G formula based on SCr of 0.49 mg/dL). Liver & Pancreas: Recent Labs  Lab 04/22/22 1133 04/23/22 0437 04/24/22 0423 04/25/22 0423  AST 9* 7*  --   --   ALT 8 7  --   --   ALKPHOS 62 54  --   --   BILITOT 0.4 0.3  --   --   PROT 7.1 6.2*  --   --   ALBUMIN 3.4* 2.9* 2.7* 2.8*   Recent Labs  Lab 04/22/22 1133  LIPASE 21   No results for input(s): "AMMONIA" in the last 168 hours. Diabetic: No results for input(s): "HGBA1C" in the last 72 hours. Recent Labs  Lab 04/24/22 2121 04/25/22 0713  04/25/22 1133 04/25/22 1638 04/26/22  [place] 163*   Cardiac Enzymes: No results for input(s): "CKTOTAL", "CKMB", "CKMBINDEX", "TROPONINI" in the last 168 hours. No results for input(s): "PROBNP" in the last 8760 hours. Coagulation Profile: No results for input(s): "INR", "PROTIME" in the last 168 hours. Thyroid Function Tests: No results for input(s): "TSH", "T4TOTAL", "FREET4", "T3FREE", "THYROIDAB" in the last 72 hours. Lipid Profile: No results for input(s): "CHOL", "HDL", "LDLCALC", "TRIG", "CHOLHDL", "LDLDIRECT" in the last 72 hours. Anemia Panel: No results for input(s): "VITAMINB12", "FOLATE", "FERRITIN", "TIBC", "IRON", "RETICCTPCT" in the last 72 hours. Urine analysis:    Component Value Date/Time   COLORURINE YELLOW 04/22/2022 1127   APPEARANCEUR HAZY (A) 04/22/2022 1127   LABSPEC 1.017 04/22/2022 1127   PHURINE 6.0 04/22/2022 1127   GLUCOSEU NEGATIVE 04/22/2022 1127   HGBUR NEGATIVE 04/22/2022 1127   BILIRUBINUR NEGATIVE 04/22/2022 1127   KETONESUR 5 (A) 04/22/2022 1127   PROTEINUR NEGATIVE 04/22/2022 1127   UROBILINOGEN 0.2 08/11/2015 2134   NITRITE NEGATIVE 04/22/2022 1127   LEUKOCYTESUR NEGATIVE 04/22/2022 1127   Sepsis Labs: Invalid input(s): "PROCALCITONIN", "LACTICIDVEN"   SIGNED:  Mercy Riding, MD  [facility] 04/26/2022, 3:35 PM

## 2022-05-03 ENCOUNTER — Encounter (HOSPITAL_COMMUNITY): Payer: Self-pay

## 2022-05-03 ENCOUNTER — Other Ambulatory Visit: Payer: Self-pay

## 2022-05-03 ENCOUNTER — Observation Stay (HOSPITAL_COMMUNITY)
Admission: EM | Admit: 2022-05-03 | Discharge: 2022-05-04 | Disposition: A | Discharge status: Home / Self Care | Payer: Medicaid Other | Attending: Internal Medicine | Admitting: Internal Medicine

## 2022-05-03 ENCOUNTER — Emergency Department (HOSPITAL_COMMUNITY): Payer: Medicaid Other

## 2022-05-03 DIAGNOSIS — E119 Type 2 diabetes mellitus without complications: Secondary | ICD-10-CM | POA: Insufficient documentation

## 2022-05-03 DIAGNOSIS — K5090 Crohn's disease, unspecified, without complications: Secondary | ICD-10-CM | POA: Diagnosis present

## 2022-05-03 DIAGNOSIS — Z794 Long term (current) use of insulin: Secondary | ICD-10-CM | POA: Insufficient documentation

## 2022-05-03 DIAGNOSIS — Z79899 Other long term (current) drug therapy: Secondary | ICD-10-CM | POA: Diagnosis not present

## 2022-05-03 DIAGNOSIS — K5010 Crohn's disease of large intestine without complications: Principal | ICD-10-CM | POA: Insufficient documentation

## 2022-05-03 DIAGNOSIS — Z87891 Personal history of nicotine dependence: Secondary | ICD-10-CM | POA: Insufficient documentation

## 2022-05-03 DIAGNOSIS — R1031 Right lower quadrant pain: Secondary | ICD-10-CM | POA: Diagnosis present

## 2022-05-03 HISTORY — DX: Type 2 diabetes mellitus without complications: E11.9

## 2022-05-03 HISTORY — DX: Crohn's disease, unspecified, without complications: K50.90

## 2022-05-03 LAB — I-STAT BETA HCG BLOOD, ED (MC, WL, AP ONLY): I-stat hCG, quantitative: <5 m[IU]/mL (ref <5)

## 2022-05-03 LAB — COMPREHENSIVE METABOLIC PANEL
ALT: 16 U/L (ref 0–44)
AST: 9 U/L — ABNORMAL LOW (ref 15–41)
Albumin: 3.7 g/dL (ref 3.5–5.0)
Alkaline Phosphatase: 57 U/L (ref 38–126)
Anion gap: 8 (ref 5–15)
BUN: 11 mg/dL (ref 6–20)
CO2: 28 mmol/L (ref 22–32)
Calcium: 9.4 mg/dL (ref 8.9–10.3)
Chloride: 101 mmol/L (ref 98–111)
Creatinine, Ser: 0.7 mg/dL (ref 0.44–1.00)
GFR, Estimated: >60 mL/min (ref >60)
Glucose, Bld: 150 mg/dL — ABNORMAL HIGH (ref 70–99)
Potassium: 3.5 mmol/L (ref 3.5–5.1)
Sodium: 137 mmol/L (ref 135–145)
Total Bilirubin: 0.4 mg/dL (ref 0.3–1.2)
Total Protein: 7.2 g/dL (ref 6.5–8.1)

## 2022-05-03 LAB — CBG MONITORING, ED
Glucose-Capillary: 150 mg/dL — ABNORMAL HIGH (ref 70–99)
Glucose-Capillary: 153 mg/dL — ABNORMAL HIGH (ref 70–99)

## 2022-05-03 LAB — URINALYSIS, ROUTINE W REFLEX MICROSCOPIC
Bilirubin Urine: NEGATIVE
Glucose, UA: NEGATIVE mg/dL
Hgb urine dipstick: NEGATIVE
Ketones, ur: NEGATIVE mg/dL
Leukocytes,Ua: NEGATIVE
Nitrite: NEGATIVE
Protein, ur: NEGATIVE mg/dL
Specific Gravity, Urine: 1.015 (ref 1.005–1.030)
pH: 7 (ref 5.0–8.0)

## 2022-05-03 LAB — CBC
HCT: 42.4 % (ref 36.0–46.0)
Hemoglobin: 13.4 g/dL (ref 12.0–15.0)
MCH: 27.6 pg (ref 26.0–34.0)
MCHC: 31.6 g/dL (ref 30.0–36.0)
MCV: 87.4 fL (ref 80.0–100.0)
Platelets: 382 10*3/uL (ref 150–400)
RBC: 4.85 MIL/uL (ref 3.87–5.11)
RDW: 14.9 % (ref 11.5–15.5)
WBC: 20.6 10*3/uL — ABNORMAL HIGH (ref 4.0–10.5)
nRBC: 0 % (ref 0.0–0.2)

## 2022-05-03 LAB — LIPASE, BLOOD: Lipase: 24 U/L (ref 11–51)

## 2022-05-03 LAB — GLUCOSE, CAPILLARY: Glucose-Capillary: 128 mg/dL — ABNORMAL HIGH (ref 70–99)

## 2022-05-03 MED ORDER — PANTOPRAZOLE SODIUM 40 MG PO TBEC
40.0000 mg | DELAYED_RELEASE_TABLET | Freq: Every day | ORAL | Status: DC
  Administered 2022-05-03 – 2022-05-04 (×2): 40 mg via ORAL
  Filled 2022-05-03 (×2): qty 1

## 2022-05-03 MED ORDER — INSULIN ASPART 100 UNIT/ML IJ SOLN
0.0000 [IU] | Freq: Every day | INTRAMUSCULAR | Status: DC
  Filled 2022-05-03: qty 0.05

## 2022-05-03 MED ORDER — INSULIN DETEMIR 100 UNIT/ML ~~LOC~~ SOLN: 15.0000 [IU] | Freq: Every day | SUBCUTANEOUS | Status: DC

## 2022-05-03 MED ORDER — INSULIN DETEMIR 100 UNIT/ML ~~LOC~~ SOLN: 10.0000 [IU] | Freq: Every day | SUBCUTANEOUS | Status: DC

## 2022-05-03 MED ORDER — ENOXAPARIN SODIUM 40 MG/0.4ML IJ SOSY
40.0000 mg | PREFILLED_SYRINGE | INTRAMUSCULAR | Status: DC
  Administered 2022-05-03: 40 mg via SUBCUTANEOUS
  Filled 2022-05-03: qty 0.4

## 2022-05-03 MED ORDER — KCL IN DEXTROSE-NACL 20-5-0.9 MEQ/L-%-% IV SOLN
INTRAVENOUS | Status: DC
  Filled 2022-05-03 (×2): qty 1000

## 2022-05-03 MED ORDER — INSULIN DETEMIR 100 UNIT/ML ~~LOC~~ SOLN
15.0000 [IU] | Freq: Two times a day (BID) | SUBCUTANEOUS | Status: DC
  Administered 2022-05-03 – 2022-05-04 (×2): 15 [IU] via SUBCUTANEOUS
  Filled 2022-05-03 (×3): qty 0.15

## 2022-05-03 MED ORDER — TRAMADOL HCL 50 MG PO TABS
50.0000 mg | ORAL_TABLET | Freq: Four times a day (QID) | ORAL | Status: DC | PRN
  Administered 2022-05-03 – 2022-05-04 (×2): 50 mg via ORAL
  Filled 2022-05-03 (×2): qty 1

## 2022-05-03 MED ORDER — INSULIN ASPART 100 UNIT/ML IJ SOLN
0.0000 [IU] | Freq: Three times a day (TID) | INTRAMUSCULAR | Status: DC
  Administered 2022-05-03: 2 [IU] via SUBCUTANEOUS
  Administered 2022-05-04: 1 [IU] via SUBCUTANEOUS
  Administered 2022-05-04: 2 [IU] via SUBCUTANEOUS
  Filled 2022-05-03: qty 0.09

## 2022-05-03 MED ORDER — ONDANSETRON HCL 4 MG PO TABS: 4.0000 mg | ORAL_TABLET | Freq: Four times a day (QID) | ORAL | Status: DC | PRN

## 2022-05-03 MED ORDER — FENTANYL CITRATE PF 50 MCG/ML IJ SOSY
50.0000 ug | PREFILLED_SYRINGE | Freq: Once | INTRAMUSCULAR | Status: AC
  Administered 2022-05-03: 50 ug via INTRAVENOUS
  Filled 2022-05-03: qty 1

## 2022-05-03 MED ORDER — ACETAMINOPHEN 500 MG PO TABS: 1000.0000 mg | ORAL_TABLET | Freq: Four times a day (QID) | ORAL | Status: DC | PRN

## 2022-05-03 MED ORDER — METHYLPREDNISOLONE SODIUM SUCC 40 MG IJ SOLR: 40.0000 mg | Freq: Once | INTRAMUSCULAR | Status: DC

## 2022-05-03 MED ORDER — OXYCODONE HCL 5 MG PO TABS
5.0000 mg | ORAL_TABLET | ORAL | Status: DC | PRN
  Administered 2022-05-03 – 2022-05-04 (×3): 10 mg via ORAL
  Filled 2022-05-03 (×4): qty 2

## 2022-05-03 MED ORDER — ONDANSETRON HCL 4 MG/2ML IJ SOLN: 4.0000 mg | Freq: Four times a day (QID) | INTRAMUSCULAR | Status: DC | PRN

## 2022-05-03 MED ORDER — OXYCODONE HCL 5 MG PO TABS
5.0000 mg | ORAL_TABLET | ORAL | Status: DC | PRN
  Administered 2022-05-03: 5 mg via ORAL
  Filled 2022-05-03: qty 1

## 2022-05-03 MED ORDER — PREDNISONE 20 MG PO TABS
40.0000 mg | ORAL_TABLET | Freq: Every day | ORAL | Status: DC
Start: 2022-05-04 — End: 2022-05-04
  Administered 2022-05-04: 40 mg via ORAL
  Filled 2022-05-03: qty 2

## 2022-05-03 MED ORDER — POLYVINYL ALCOHOL 1.4 % OP SOLN
1.0000 [drp] | Freq: Every day | OPHTHALMIC | Status: DC | PRN
Start: 2022-05-03 — End: 2022-05-04
  Filled 2022-05-03: qty 15

## 2022-05-03 NOTE — H&P (Signed)
ADMISSION HISTORY AND PHYSICAL   Sandra Foster DDU:[id number] DOB: 12/23/1990 DOA: 05/03/2022  PCP: Patient, No Pcp Per Patient coming from: home via [facility]  Chief Complaint: abdom pain   HPI:  31 year old with a history of tobacco abuse, DM2, and Crohn's disease diagnosed in March 2023 on chronic prednisone and Humira who was discharged from the hospital 04/27/2022 after achieving control following an acute flare who returned to the ER today after suffering a resurgence of generalized abdominal pain.  She reported some mild nausea but no significant vomiting.  CT abdomen and pelvis in the ER suggested possible ileus in the terminal ileum.  Assessment/Plan  Crohn's disease with acute flare Follow recommendations as per GI -one-time dose of IV Solu-Medrol followed by ongoing use of prednisone -patient is also on Humira  Ileus versus obstruction at terminal ileum Noted on CT abdomen -clinical history and exam not consistent with obstruction -observe -allow liquid diet and monitor  DM2 Newly started on insulin during hospitalization in June 2023 -monitor CBG with ongoing use of prednisone and utilize SSI for control   DVT prophylaxis: Lovenox Code Status: Full Family Communication: No family present at time of exam Disposition Plan:  Admit to Inpatient  Consults called: none indicated  Review of Systems: As per HPI otherwise 10 point review of systems negative.   Past Medical History:  Diagnosis Date   Chlamydia    Crohn's disease ([place])    Diabetes mellitus without complication ([place])    Fetal demise    hyperglycemic hyperosmolar nonketonic syndrome   Hernia    Pregnancy induced hypertension    Pyelonephritis     Past Surgical History:  Procedure Laterality Date   BIOPSY  01/09/2022   Procedure: BIOPSY;  Surgeon: Carol Traniece, MD;  Location: [facility] ENDOSCOPY;  Service: Gastroenterology;;   COLONOSCOPY WITH PROPOFOL N/A 01/09/2022   Procedure: COLONOSCOPY WITH PROPOFOL;   Surgeon: Carol Bea, MD;  Location: [place];  Service: Gastroenterology;  Laterality: N/A;   HERNIA REPAIR      Family History  Family History  Problem Relation Age of Onset   Hypertension Maternal Grandmother     Social History   reports that she has quit smoking. Her smoking use included cigarettes. She smoked an average of .25 packs per day. She quit smokeless tobacco use about 10 years ago. She reports that she does not drink alcohol and does not use drugs.  Allergies No Known Allergies  Prior to Admission medications   Medication Sig Start Date End Date Taking? Authorizing Provider  acetaminophen (TYLENOL) 500 MG tablet Take 1,000 mg by mouth every 6 (six) hours as needed for mild pain.    [provider]  Adalimumab (HUMIRA PEN) 40 MG/0.4ML PNKT Inject 40 mg into the skin every 14 (fourteen) days.    [provider]  blood glucose meter kit and supplies Dispense based on patient and insurance preference. Use up to four times daily as directed. (FOR ICD-10 E10.9, E11.9). 04/26/22   Mercy Riding, MD  carboxymethylcellulose (REFRESH PLUS) 0.5 % SOLN Place 1 drop into both eyes daily as needed (dry eyes).    [provider]  etonogestrel (NEXPLANON) 68 MG IMPL implant 1 each by Subdermal route once.    [provider]  insulin aspart (NOVOLOG) 100 UNIT/ML FlexPen CBG < 70: Implement Hypoglycemia Standing Orders and refer to Hypoglycemia Standing Orders sidebar report CBG 70 - 120: 0 units CBG 121 - 150: 2 units CBG 151 - 200: 3  units CBG 201 - 250: 5 units CBG 251 - 300: 8 units CBG 301 - 350: 11 units CBG 351 - 400: 15 units CBG > 400: call MD and obtain STAT lab verification 04/26/22   Mercy Riding, MD  insulin glargine (LANTUS) 100 UNIT/ML Solostar Pen Inject 15 Units into the skin 2 (two) times daily. 04/26/22   Mercy Riding, MD  Insulin Pen Needle (PEN NEEDLES) 30G X 5 MM MISC 1 Pen by Does not apply route 4 (four) times daily -  before  meals and at bedtime. 04/26/22   Mercy Riding, MD  nicotine (NICODERM CQ - DOSED IN MG/24 HOURS) 14 mg/24hr patch Place 1 patch (14 mg total) onto the skin daily. 04/26/22   Mercy Riding, MD  pantoprazole (PROTONIX) 40 MG tablet Take 1 tablet (40 mg total) by mouth daily. 04/26/22 05/26/22  Mercy Riding, MD  predniSONE (DELTASONE) 20 MG tablet Take 2 tablets (40 mg total) by mouth daily with breakfast for 15 days. 04/27/22 05/12/22  Mercy Riding, MD  amitriptyline (ELAVIL) 25 MG tablet Take 1-2 tablets (25-50 mg total) by mouth at bedtime as needed (headache). 03/25/20 06/18/20  Gregor Hams, MD    Physical Exam: Vitals:   05/03/22 1100 05/03/22 1104 05/03/22 1404 05/03/22 1554  BP: 108/73  112/73 112/72  Pulse: 63  65 60  Resp: _0 Temp: 98.4 F (36.9 C)     TempSrc: Oral     SpO2: 99%  100% 100%  Weight:  62.1 kg    Height:  _1  (1.676 m)      Constitutional: NAD, calm, comfortable Eyes: PERRL, lids and conjunctivae normal ENMT: Mucous membranes are moist. Posterior pharynx clear of any exudate or lesions.  Respiratory: clear to auscultation bilaterally, no wheezing, no crackles. Normal respiratory effort. No accessory muscle use.  Cardiovascular: Regular rate and rhythm, no murmurs / rubs / gallops. No extremity edema.  Abdomen: Mildly tender diffusely, soft, bowel sounds positive, no rebound, not distended Musculoskeletal: No clubbing / cyanosis. No joint deformity upper and lower extremities. No contractures. Normal muscle tone.  Skin: No rashes, lesions, ulcers.  Neurologic: CN 2-12 grossly intact B. Sensation intact.  Psychiatric: Normal judgment and insight. Alert and oriented x 3. Normal mood.    Labs on Admission:   CBC: Recent Labs  Lab 05/03/22 1115  WBC 20.6*  HGB 13.4  HCT 42.4  MCV 87.4  PLT 681   Basic Metabolic Panel: Recent Labs  Lab 05/03/22 1115  NA 137  K 3.5  CL 101  CO2 28  GLUCOSE 150*  BUN 11  CREATININE 0.70  CALCIUM 9.4    GFR: Estimated Creatinine Clearance: 96.3 mL/min (by C-G formula based on SCr of 0.7 mg/dL).  Liver Function Tests: Recent Labs  Lab 05/03/22 1115  AST 9*  ALT 16  ALKPHOS 57  BILITOT 0.4  PROT 7.2  ALBUMIN 3.7   Recent Labs  Lab 05/03/22 1115  LIPASE 24   CBG: Recent Labs  Lab 05/03/22 1101 05/03/22 1552  GLUCAP 150* 153*   Urine analysis:    Component Value Date/Time   COLORURINE YELLOW 05/03/2022 1115   APPEARANCEUR CLOUDY (A) 05/03/2022 1115   LABSPEC 1.015 05/03/2022 1115   PHURINE 7.0 05/03/2022 1115   [place] 05/03/2022 1115   [place] NEGATIVE 05/03/2022 1115   [place] 05/03/2022 [place] 05/03/2022 1115   PROTEINUR NEGATIVE 05/03/2022 1115   UROBILINOGEN  0.2 08/11/2015 2134   NITRITE NEGATIVE 05/03/2022 1115   LEUKOCYTESUR NEGATIVE 05/03/2022 1115     Radiological Exams on Admission: CT Abdomen Pelvis Wo Contrast  Result Date: 05/03/2022 CLINICAL DATA:  RLQ abdominal pain (Age >= 14y) EXAM: CT ABDOMEN AND PELVIS WITHOUT CONTRAST TECHNIQUE: Multidetector CT imaging of the abdomen and pelvis was performed following the standard protocol without IV contrast. RADIATION DOSE REDUCTION: This exam was performed according to the departmental dose-optimization program which includes automated exposure control, adjustment of the mA and/or kV according to patient size and/or use of iterative reconstruction technique. COMPARISON:  March 2023 FINDINGS: Lower chest: No acute abnormality. Hepatobiliary: No focal liver abnormality is seen. No gallstones, gallbladder wall thickening, or biliary dilatation. Pancreas: Unremarkable. Spleen: Unremarkable. Adrenals/Urinary Tract: Adrenals, kidneys, and poorly distended bladder are unremarkable. Stomach/Bowel: Stomach is within normal limits. There are inflammatory changes in the right lower quadrant about the terminal ileum. Dilatation of distal ileal loops reflecting associated ileus or  obstruction. Appendix appears normal. Vascular/Lymphatic: No significant vascular abnormality on this noncontrast study. Likely reactive mesenteric nodes. Reproductive: Uterus and bilateral adnexa are unremarkable. Other: No free air. Musculoskeletal: No acute osseous abnormality. IMPRESSION: Suboptimal evaluation on this study performed without intravenous or enteric contrast. Right lower quadrant inflammatory changes likely reflecting persistent or recurrent terminal ileitis. Dilatation of distal ileal loops reflects associated ileus or obstruction. Electronically Signed   By: Macy Mis M.D.   On: 05/03/2022 12:15     Cherene Altes, MD [facility] Office  [phone] Pager - Text Page per Amion as per below:  On-Call/Text Page:      [email or web address]  If 7PM-7AM, please contact night-coverage [email or web address] 05/03/2022, 4:48 PM

## 2022-05-03 NOTE — Progress Notes (Signed)
I am familiar with Ms. Osuch condition.  She was discharged from the hospital on 04/27/2022 with good control, however, she had a baseline level of pain.  The pain acutely worsened last evening and she was not able to manage the pain at home.  She denied any nausea or vomiting. The CT scan today suggests an ileus versus and obstruction in the terminal ileum.  She reports tolerating PO without any issues.  The patient was discharged home on prednisone 40 mg and she was to maintain her Humira.  She has a follow up appointment with Dr. Loreta Ave this coming Tuesday.  The physical examination today does not suggest an obstruction and she desires to eat.  After his hospitalization she was not provided with any pain medications and she was managing her pain.  She does have diarrhea with her Crohn's disease.  Also, she states that she was never taught how to dose her insulin and at one point she was very tremulous and she believes she over medicated.  Plan: 1) Solumedrol 40 mg IV x 1. 2) Maintain prednisone. 3) Pain control.  Upon discharge she will need pain medication, at least until she can see Dr. Loreta Ave. 4) Trial of PO intake.

## 2022-05-03 NOTE — ED Provider Triage Note (Signed)
Emergency Medicine Provider Triage Evaluation Note  Sandra Foster , a 31 y.o. female  was evaluated in triage.  Pt complains of abdominal pain with mild nausea.  Patient complains of periumbilical and right lower quadrant abdominal pain which became severe today.  Pain started yesterday.  Patient was hospitalized at the end of June and diagnosed with Crohn's disease.  Patient states it feels somewhat similar to that pain.  Patient states the nausea is mild and is only bad when the pain kicks up.  Pain seems to come somewhat in waves.  Denies bloody stools.  Patient is also diabetic and confused on when to take her different insulin  Review of Systems  Positive: Abdominal pain, nausea Negative: Blood in stools  Physical Exam  BP 108/73 (BP Location: Right Arm)   Pulse 63   Temp 98.4 F (36.9 C) (Oral)   Resp 16   Ht 5\' 6"  (1.676 m)   Wt 62.1 kg   SpO2 99%   BMI 22.11 kg/m  Gen:   Awake, no distress   Resp:  Normal effort  MSK:   Moves extremities without difficulty  Other:    Medical Decision Making  Medically screening exam initiated at 11:11 AM.  Appropriate orders placed.  was informed that the remainder of the evaluation will be completed by another provider, this initial triage assessment does not replace that evaluation, and the importance of remaining in the ED until their evaluation is complete.     Sandra Idol, PA-C 05/03/22 1115

## 2022-05-03 NOTE — ED Provider Notes (Signed)
[place] DEPT Provider Note   CSN: [id number] Arrival date & time: 05/03/22  1055     History  Chief Complaint  Patient presents with   Abdominal Pain   Nausea    Sandra Foster is a 31 y.o. female.  Patient is a 31 year old female who presents with abdominal pain.  She was recently diagnosed with Crohn's disease in March.  She was admitted in March for the Crohn's flareup.  She was readmitted on June 25 to June 29 for Crohn's exacerbation.  She had previously been started on Humira.  She is followed by Dr. Collene Mares.  She also has a history of diabetes and was started on insulin during her recent hospitalization.  She was discharged on June 29 with prednisone of 40 mg a day.  She said she started having pain again over the last couple of days.  She has some nausea but no vomiting.  No change in her stools.  No known fevers.  No urinary symptoms.  The pain feels similar to her prior Crohn's flareups.       Home Medications Prior to Admission medications   Medication Sig Start Date End Date Taking? Authorizing Provider  acetaminophen (TYLENOL) 500 MG tablet Take 1,000 mg by mouth every 6 (six) hours as needed for mild pain.    [provider]  Adalimumab (HUMIRA PEN) 40 MG/0.4ML PNKT Inject 40 mg into the skin every 14 (fourteen) days.    [provider]  blood glucose meter kit and supplies Dispense based on patient and insurance preference. Use up to four times daily as directed. (FOR ICD-10 E10.9, E11.9). 04/26/22   Mercy Riding, MD  carboxymethylcellulose (REFRESH PLUS) 0.5 % SOLN Place 1 drop into both eyes daily as needed (dry eyes).    [provider]  etonogestrel (NEXPLANON) 68 MG IMPL implant 1 each by Subdermal route once.    [provider]  insulin aspart (NOVOLOG) 100 UNIT/ML FlexPen CBG < 70: Implement Hypoglycemia Standing Orders and refer to Hypoglycemia Standing Orders sidebar report CBG 70 - 120: 0 units  CBG 121 - 150: 2 units CBG 151 - 200: 3 units CBG 201 - 250: 5 units CBG 251 - 300: 8 units CBG 301 - 350: 11 units CBG 351 - 400: 15 units CBG > 400: call MD and obtain STAT lab verification 04/26/22   Mercy Riding, MD  insulin glargine (LANTUS) 100 UNIT/ML Solostar Pen Inject 15 Units into the skin 2 (two) times daily. 04/26/22   Mercy Riding, MD  Insulin Pen Needle (PEN NEEDLES) 30G X 5 MM MISC 1 Pen by Does not apply route 4 (four) times daily -  before meals and at bedtime. 04/26/22   Mercy Riding, MD  nicotine (NICODERM CQ - DOSED IN MG/24 HOURS) 14 mg/24hr patch Place 1 patch (14 mg total) onto the skin daily. 04/26/22   Mercy Riding, MD  pantoprazole (PROTONIX) 40 MG tablet Take 1 tablet (40 mg total) by mouth daily. 04/26/22 05/26/22  Mercy Riding, MD  predniSONE (DELTASONE) 20 MG tablet Take 2 tablets (40 mg total) by mouth daily with breakfast for 15 days. 04/27/22 05/12/22  Mercy Riding, MD  amitriptyline (ELAVIL) 25 MG tablet Take 1-2 tablets (25-50 mg total) by mouth at bedtime as needed (headache). 03/25/20 06/18/20  Gregor Hams, MD      Allergies    Patient has no known allergies.    Review of Systems  Review of Systems  Constitutional:  Negative for chills, diaphoresis, fatigue and fever.  HENT:  Negative for congestion, rhinorrhea and sneezing.   Eyes: Negative.   Respiratory:  Negative for cough, chest tightness and shortness of breath.   Cardiovascular:  Negative for chest pain and leg swelling.  Gastrointestinal:  Positive for abdominal pain and nausea. Negative for blood in stool, diarrhea and vomiting.  Genitourinary:  Negative for difficulty urinating, flank pain, frequency and hematuria.  Musculoskeletal:  Negative for arthralgias and back pain.  Skin:  Negative for rash.  Neurological:  Negative for dizziness, speech difficulty, weakness, numbness and headaches.    Physical Exam Updated Vital Signs BP 112/72   Pulse 60   Temp 98.4 F (36.9 C) (Oral)   Resp 15    Ht _0  (1.676 m)   Wt 62.1 kg   SpO2 100%   BMI 22.11 kg/m  Physical Exam Constitutional:      Appearance: She is well-developed.  HENT:     Head: Normocephalic and atraumatic.  Eyes:     Pupils: Pupils are equal, round, and reactive to light.  Cardiovascular:     Rate and Rhythm: Normal rate and regular rhythm.     Heart sounds: Normal heart sounds.  Pulmonary:     Effort: Pulmonary effort is normal. No respiratory distress.     Breath sounds: Normal breath sounds. No wheezing or rales.  Chest:     Chest wall: No tenderness.  Abdominal:     General: Bowel sounds are normal.     Palpations: Abdomen is soft.     Tenderness: There is abdominal tenderness in the right lower quadrant. There is no guarding or rebound.  Musculoskeletal:        General: Normal range of motion.     Cervical back: Normal range of motion and neck supple.  Lymphadenopathy:     Cervical: No cervical adenopathy.  Skin:    General: Skin is warm and dry.     Findings: No rash.  Neurological:     Mental Status: She is alert and oriented to person, place, and time.     ED Results / Procedures / Treatments   Labs (all labs ordered are listed, but only abnormal results are displayed) Labs Reviewed  COMPREHENSIVE METABOLIC PANEL - Abnormal; Notable for the following components:      Result Value   Glucose, Bld 150 (*)    AST 9 (*)    All other components within normal limits  CBC - Abnormal; Notable for the following components:   WBC 20.6 (*)    All other components within normal limits  URINALYSIS, ROUTINE W REFLEX MICROSCOPIC - Abnormal; Notable for the following components:   APPearance CLOUDY (*)    All other components within normal limits  CBG MONITORING, ED - Abnormal; Notable for the following components:   Glucose-Capillary 150 (*)    All other components within normal limits  CBG MONITORING, ED - Abnormal; Notable for the following components:   Glucose-Capillary 153 (*)    All  other components within normal limits  LIPASE, BLOOD  I-STAT BETA HCG BLOOD, ED ([facility], [facility], [facility] ONLY)    EKG None  Radiology CT Abdomen Pelvis Wo Contrast  Result Date: 05/03/2022 CLINICAL DATA:  RLQ abdominal pain (Age >= 14y) EXAM: CT ABDOMEN AND PELVIS WITHOUT CONTRAST TECHNIQUE: Multidetector CT imaging of the abdomen and pelvis was performed following the standard protocol without IV contrast. RADIATION DOSE REDUCTION: This exam was performed  according to the departmental dose-optimization program which includes automated exposure control, adjustment of the mA and/or kV according to patient size and/or use of iterative reconstruction technique. COMPARISON:  March 2023 FINDINGS: Lower chest: No acute abnormality. Hepatobiliary: No focal liver abnormality is seen. No gallstones, gallbladder wall thickening, or biliary dilatation. Pancreas: Unremarkable. Spleen: Unremarkable. Adrenals/Urinary Tract: Adrenals, kidneys, and poorly distended bladder are unremarkable. Stomach/Bowel: Stomach is within normal limits. There are inflammatory changes in the right lower quadrant about the terminal ileum. Dilatation of distal ileal loops reflecting associated ileus or obstruction. Appendix appears normal. Vascular/Lymphatic: No significant vascular abnormality on this noncontrast study. Likely reactive mesenteric nodes. Reproductive: Uterus and bilateral adnexa are unremarkable. Other: No free air. Musculoskeletal: No acute osseous abnormality. IMPRESSION: Suboptimal evaluation on this study performed without intravenous or enteric contrast. Right lower quadrant inflammatory changes likely reflecting persistent or recurrent terminal ileitis. Dilatation of distal ileal loops reflects associated ileus or obstruction. Electronically Signed   By: Macy Mis M.D.   On: 05/03/2022 12:15    Procedures Procedures    Medications Ordered in ED Medications  acetaminophen (TYLENOL) tablet 1,000 mg (has no  administration in time range)  carboxymethylcellulose (REFRESH PLUS) 0.5 % ophthalmic solution 1 drop (has no administration in time range)  pantoprazole (PROTONIX) EC tablet 40 mg (has no administration in time range)  predniSONE (DELTASONE) tablet 40 mg (has no administration in time range)  enoxaparin (LOVENOX) injection 40 mg (has no administration in time range)  dextrose 5 % and 0.9 % NaCl with KCl 20 mEq/L infusion (has no administration in time range)  oxyCODONE (Oxy IR/ROXICODONE) immediate release tablet 5 mg (has no administration in time range)  ondansetron (ZOFRAN) tablet 4 mg (has no administration in time range)    Or  ondansetron (ZOFRAN) injection 4 mg (has no administration in time range)  insulin aspart (novoLOG) injection 0-9 Units (has no administration in time range)  insulin aspart (novoLOG) injection 0-5 Units (has no administration in time range)  insulin detemir (LEVEMIR) injection 15 Units (has no administration in time range)  fentaNYL (SUBLIMAZE) injection 50 mcg (50 mcg Intravenous Given 05/03/22 1641)    ED Course/ Medical Decision Making/ A&P                           Medical Decision Making Amount and/or Complexity of Data Reviewed Labs: ordered.  Risk Prescription drug management. Decision regarding hospitalization.   Patient is a 31 year old female who presents with abdominal pain consistent with her prior Crohn's flareups.  She had a CT scan which shows evidence of inflammation in the terminal ileum.  There is also question of ileus versus obstruction.  Clinically she does not present like a bowel obstruction.  She has no vomiting.  Her labs show markedly elevated WBC count.  This could be a combination of the steroids and the inflammation.  She is afebrile with no suggestions of sepsis.  Her other labs are nonconcerning.  Her urine is not consistent with infection.  I discussed the case with Dr. Benson Norway with gastroenterology.  He recommended continuing the  patient on 40 mg of prednisone and admitting overnight for observation.  I spoke with Dr. Thereasa Solo who will admit the patient.  Final Clinical Impression(s) / ED Diagnoses Final diagnoses:  Crohn's disease of colon without complication ([place])    Rx / DC Orders ED Discharge Orders     None         Leola Fiore,  Threasa Beards, MD 05/03/22 1650

## 2022-05-03 NOTE — ED Triage Notes (Signed)
Per EMS- patient c/o abdominal pain and nausea. Patient reports a history of crohn's and diabetes.. patient was recently hospitalized for both.  Patient also reports that she is unsure when to take her 2 different kinds of insulin. CBG-150 in triage.

## 2022-05-04 DIAGNOSIS — K5090 Crohn's disease, unspecified, without complications: Secondary | ICD-10-CM | POA: Diagnosis not present

## 2022-05-04 LAB — GLUCOSE, CAPILLARY
Glucose-Capillary: 136 mg/dL — ABNORMAL HIGH (ref 70–99)
Glucose-Capillary: 173 mg/dL — ABNORMAL HIGH (ref 70–99)

## 2022-05-04 MED ORDER — OXYCODONE HCL 5 MG PO TABS: 5.0000 mg | ORAL_TABLET | ORAL | 0 refills | Status: DC | PRN

## 2022-05-04 MED ORDER — ALUM & MAG HYDROXIDE-SIMETH 200-200-20 MG/5ML PO SUSP
30.0000 mL | ORAL | Status: DC | PRN
  Administered 2022-05-04: 30 mL via ORAL
  Filled 2022-05-04: qty 30

## 2022-05-04 NOTE — Progress Notes (Signed)
Pt. Complaint of Intermittent Chest Pain (mid sternum) pain score of 4-5. Vital signs checked, WNL. PRN for pain given, MD notified, maalox ordered.

## 2022-05-04 NOTE — Progress Notes (Signed)
Subjective: Feeling better.  Tolerated PO without any difficulty.  Objective: Vital signs in last 24 hours: Temp:  [97.7 F (36.5 C)-98.4 F (36.9 C)] 98.1 F (36.7 C) (07/07 0624) Pulse Rate:  [58-77] 77 (07/07 0624) Resp:  [15-18] 18 (07/07 0624) BP: (91-114)/(51-78) 92/51 (07/07 0624) SpO2:  [93 %-100 %] 99 % (07/07 0624) Weight:  [62.1 kg] 62.1 kg (07/06 1104)    Intake/Output from previous day: 07/06 0701 - 07/07 0700 In: 860.3 [P.O.:120; I.V.:740.3] Out: -  Intake/Output this shift: No intake/output data recorded.  General appearance: alert and no distress GI: tender in the RLQ  Lab Results: Recent Labs    05/03/22 1115  WBC 20.6*  HGB 13.4  HCT 42.4  PLT 382   BMET Recent Labs    05/03/22 1115  NA 137  K 3.5  CL 101  CO2 28  GLUCOSE 150*  BUN 11  CREATININE 0.70  CALCIUM 9.4   LFT Recent Labs    05/03/22 1115  PROT 7.2  ALBUMIN 3.7  AST 9*  ALT 16  ALKPHOS 57  BILITOT 0.4   PT/INR No results for input(s): "LABPROT", "INR" in the last 72 hours. Hepatitis Panel No results for input(s): "HEPBSAG", "HCVAB", "HEPAIGM", "HEPBIGM" in the last 72 hours. C-Diff No results for input(s): "CDIFFTOX" in the last 72 hours. Fecal Lactopherrin No results for input(s): "FECLLACTOFRN" in the last 72 hours.  Studies/Results: CT Abdomen Pelvis Wo Contrast  Result Date: 05/03/2022 CLINICAL DATA:  RLQ abdominal pain (Age >= 14y) EXAM: CT ABDOMEN AND PELVIS WITHOUT CONTRAST TECHNIQUE: Multidetector CT imaging of the abdomen and pelvis was performed following the standard protocol without IV contrast. RADIATION DOSE REDUCTION: This exam was performed according to the departmental dose-optimization program which includes automated exposure control, adjustment of the mA and/or kV according to patient size and/or use of iterative reconstruction technique. COMPARISON:  March 2023 FINDINGS: Lower chest: No acute abnormality. Hepatobiliary: No focal liver abnormality is  seen. No gallstones, gallbladder wall thickening, or biliary dilatation. Pancreas: Unremarkable. Spleen: Unremarkable. Adrenals/Urinary Tract: Adrenals, kidneys, and poorly distended bladder are unremarkable. Stomach/Bowel: Stomach is within normal limits. There are inflammatory changes in the right lower quadrant about the terminal ileum. Dilatation of distal ileal loops reflecting associated ileus or obstruction. Appendix appears normal. Vascular/Lymphatic: No significant vascular abnormality on this noncontrast study. Likely reactive mesenteric nodes. Reproductive: Uterus and bilateral adnexa are unremarkable. Other: No free air. Musculoskeletal: No acute osseous abnormality. IMPRESSION: Suboptimal evaluation on this study performed without intravenous or enteric contrast. Right lower quadrant inflammatory changes likely reflecting persistent or recurrent terminal ileitis. Dilatation of distal ileal loops reflects associated ileus or obstruction. Electronically Signed   By: Guadlupe Spanish M.D.   On: 05/03/2022 12:15    Medications: Scheduled:  enoxaparin (LOVENOX) injection  40 mg Subcutaneous Q24H   insulin aspart  0-5 Units Subcutaneous QHS   insulin aspart  0-9 Units Subcutaneous TID WC   insulin detemir  15 Units Subcutaneous BID   methylPREDNISolone (SOLU-MEDROL) injection  40 mg Intravenous Once   pantoprazole  40 mg Oral Daily   predniSONE  40 mg Oral Q breakfast   Continuous:  dextrose 5 % and 0.9 % NaCl with KCl 20 mEq/L 75 mL/hr at 05/04/22 0700    Assessment/Plan: 1) Ileocolonic Crohn's disease. 2) RLQ pain.   She is clinically improved.  Yesterday her pain was bilaterally, but now it is mostly in the RLQ.  She states that the pain medication is helping.  Plan:  1) Okay to D/C home. 2) Please D/C with oxycodone and further management will be made as an outpatient. 3) Follow up with Dr. Loreta Ave on Tuesday. 4) Continue with prednisone 40 mg QD.  LOS: 0 days   Sandra Foster  D 05/04/2022, 9:14 AM

## 2022-05-04 NOTE — Progress Notes (Signed)
   05/04/22   To Whom it may concern,  Sandra Foster was hospitalized at [facility] from 05/03/2022 through 05/04/22. She has been informed she should not return to work until she is seen by her GI doctor in follow-up (appointment is set for 05/08/2022).  Should you have further questions please feel free to contact our office.  Be advised that no medical information will be divulged without a signed HIPA release from the patient.    Sincerely,  Lonia Blood, MD [facility] Office  [phone]

## 2022-05-04 NOTE — Plan of Care (Signed)

## 2022-05-04 NOTE — Discharge Summary (Signed)
DISCHARGE SUMMARY  Sandra Foster  MR#: [id number]  DOB:January 17, 1991  Date of Admission: 05/03/2022 Date of Discharge: 05/04/2022  Attending Physician:Cody Oliger Hennie Duos, MD  Patient's HEN:IDPOEUM, No Pcp Per  Consults: Dr. Benson Norway - GI  Disposition: Discharge home  Follow-up Appts:  Follow-up Information     Juanita Craver, MD Follow up on 05/08/2022.   Specialty: Gastroenterology Why: Keep your previously scheduled appointment. Contact information: [address] [place] [address] [phone]                 Discharge Diagnoses: Ileocolonic Crohn's disease with acute flare Right lower quadrant abdominal pain due to above DM2  Initial presentation: 31 year old with a history of tobacco abuse, DM2, and Crohn's disease diagnosed in March 2023 on chronic prednisone and Humira who was discharged from the hospital 04/27/2022 after achieving control following an acute flare who returned to the ER today after suffering a resurgence of generalized abdominal pain.  She reported some mild nausea but no significant vomiting.  CT abdomen and pelvis in the ER suggested possible ileus in the terminal ileum.  Hospital Course: The patient was admitted to the acute units with an acute flare of her recently diagnosed Crohn's disease.  Her daily prednisone dose was continued, and she was given a one-time extra dose of Solu-Medrol at 40 mg.  IV and oral pain medications were utilized.  Initially she was intolerant to oral intake due to her pain, but was steroid therapy this improved.  On the second day of her hospital stay she was able to tolerate oral intake without significant difficulty.  She was still having some paroxysms of epigastric pain as well as lower abdominal pain, but these were significant improved from admission and did not interfere with her ability to tolerate oral intake.  She was evaluated by GI, and cleared for discharge from their standpoint.  Nursing was  also asked to take advantage of the time the patient was in the hospital counseled the patient on appropriate insulin dosing and monitoring of her blood sugars.  Despite the use of steroids her blood sugar proved to be reasonably well controlled during her hospital stay with a range of 128-173.  As a result her home insulin regimen was not changed.  Allergies as of 05/04/2022   No Known Allergies      Medication List     TAKE these medications    acetaminophen 500 MG tablet Commonly known as: TYLENOL Take 500 mg by mouth every 6 (six) hours as needed for mild pain.   blood glucose meter kit and supplies Dispense based on patient and insurance preference. Use up to four times daily as directed. (FOR ICD-10 E10.9, E11.9).   carboxymethylcellulose 0.5 % Soln Commonly known as: REFRESH PLUS Place 1 drop into both eyes daily as needed (dry eyes).   Humira Pen 40 MG/0.4ML Pnkt Generic drug: Adalimumab Inject 40 mg into the skin every 14 (fourteen) days.   insulin aspart 100 UNIT/ML FlexPen Commonly known as: NOVOLOG CBG < 70: Implement Hypoglycemia Standing Orders and refer to Hypoglycemia Standing Orders sidebar report CBG 70 - 120: 0 units CBG 121 - 150: 2 units CBG 151 - 200: 3 units CBG 201 - 250: 5 units CBG 251 - 300: 8 units CBG 301 - 350: 11 units CBG 351 - 400: 15 units CBG > 400: call MD and obtain STAT lab verification What changed:  how much to take how to take this when to take  this additional instructions   insulin glargine 100 UNIT/ML Solostar Pen Commonly known as: LANTUS Inject 15 Units into the skin 2 (two) times daily.   Nexplanon 68 MG Impl implant Generic drug: etonogestrel 1 each by Subdermal route once.   nicotine 14 mg/24hr patch Commonly known as: NICODERM CQ - dosed in mg/24 hours Place 1 patch (14 mg total) onto the skin daily.   oxyCODONE 5 MG immediate release tablet Commonly known as: Oxy IR/ROXICODONE Take 1-2 tablets (5-10 mg total) by mouth  every 4 (four) hours as needed for severe pain.   pantoprazole 40 MG tablet Commonly known as: Protonix Take 1 tablet (40 mg total) by mouth daily.   Pen Needles 30G X 5 MM Misc 1 Pen by Does not apply route 4 (four) times daily -  before meals and at bedtime.   predniSONE 20 MG tablet Commonly known as: DELTASONE Take 2 tablets (40 mg total) by mouth daily with breakfast for 15 days.        Day of Discharge BP (!) 94/53 (BP Location: Right Arm)   Pulse 66   Temp 98.2 F (36.8 C) (Oral)   Resp 16   Ht $R'5\' 6"'lH$  (1.676 m)   Wt 62.1 kg   SpO2 100%   BMI 22.11 kg/m   Physical Exam: General: No acute respiratory distress Lungs: Clear to auscultation bilaterally without wheezes or crackles Cardiovascular: Regular rate and rhythm without murmur gallop or rub normal S1 and S2 Abdomen: Mildly tender diffusely but more so in right lower quadrant with no masses no rebound and positive bowel sounds Extremities: No significant cyanosis, clubbing, or edema bilateral lower extremities  Basic Metabolic Panel: Recent Labs  Lab 05/03/22 1115  NA 137  K 3.5  CL 101  CO2 28  GLUCOSE 150*  BUN 11  CREATININE 0.70  CALCIUM 9.4    Liver Function Tests: Recent Labs  Lab 05/03/22 1115  AST 9*  ALT 16  ALKPHOS 57  BILITOT 0.4  PROT 7.2  ALBUMIN 3.7   Recent Labs  Lab 05/03/22 1115  LIPASE 24    CBC: Recent Labs  Lab 05/03/22 1115  WBC 20.6*  HGB 13.4  HCT 42.4  MCV 87.4  PLT 382   CBG: Recent Labs  Lab 05/03/22 1101 05/03/22 1552 05/03/22 2319 05/04/22 0728 05/04/22 1157  GLUCAP 150* 153* 128* 136* 173*    Time spent in discharge (includes decision making & examination of pt): 30 minutes  05/04/2022, 3:05 PM   Cherene Altes, MD [facility] Office  [phone]

## 2022-05-04 NOTE — Progress Notes (Signed)
AVS given and explained to patient. Pt. Waiting for ride.

## 2022-05-22 ENCOUNTER — Encounter (HOSPITAL_COMMUNITY): Payer: Self-pay

## 2022-05-22 ENCOUNTER — Emergency Department (HOSPITAL_COMMUNITY): Payer: Medicaid Other

## 2022-05-22 ENCOUNTER — Emergency Department (HOSPITAL_COMMUNITY)
Admission: EM | Admit: 2022-05-22 | Discharge: 2022-05-22 | Disposition: A | Discharge status: Home / Self Care | Payer: Medicaid Other | Attending: Emergency Medicine | Admitting: Emergency Medicine

## 2022-05-22 DIAGNOSIS — R1031 Right lower quadrant pain: Secondary | ICD-10-CM | POA: Diagnosis present

## 2022-05-22 DIAGNOSIS — Z794 Long term (current) use of insulin: Secondary | ICD-10-CM | POA: Diagnosis not present

## 2022-05-22 DIAGNOSIS — K5010 Crohn's disease of large intestine without complications: Secondary | ICD-10-CM | POA: Diagnosis not present

## 2022-05-22 LAB — URINALYSIS, ROUTINE W REFLEX MICROSCOPIC
Glucose, UA: NEGATIVE mg/dL
Hgb urine dipstick: NEGATIVE
Ketones, ur: 5 mg/dL — AB
Leukocytes,Ua: NEGATIVE
Nitrite: NEGATIVE
Protein, ur: 100 mg/dL — AB
Specific Gravity, Urine: 1.028 (ref 1.005–1.030)
pH: 5 (ref 5.0–8.0)

## 2022-05-22 LAB — COMPREHENSIVE METABOLIC PANEL
ALT: 9 U/L (ref 0–44)
AST: 12 U/L — ABNORMAL LOW (ref 15–41)
Albumin: 3.1 g/dL — ABNORMAL LOW (ref 3.5–5.0)
Alkaline Phosphatase: 51 U/L (ref 38–126)
Anion gap: 9 (ref 5–15)
BUN: 10 mg/dL (ref 6–20)
CO2: 27 mmol/L (ref 22–32)
Calcium: 8.4 mg/dL — ABNORMAL LOW (ref 8.9–10.3)
Chloride: 99 mmol/L (ref 98–111)
Creatinine, Ser: 0.61 mg/dL (ref 0.44–1.00)
GFR, Estimated: >60 mL/min (ref >60)
Glucose, Bld: 188 mg/dL — ABNORMAL HIGH (ref 70–99)
Potassium: 3.3 mmol/L — ABNORMAL LOW (ref 3.5–5.1)
Sodium: 135 mmol/L (ref 135–145)
Total Bilirubin: 0.3 mg/dL (ref 0.3–1.2)
Total Protein: 6.7 g/dL (ref 6.5–8.1)

## 2022-05-22 LAB — I-STAT BETA HCG BLOOD, ED (MC, WL, AP ONLY): I-stat hCG, quantitative: <5 m[IU]/mL (ref <5)

## 2022-05-22 LAB — CBC
HCT: 34.6 % — ABNORMAL LOW (ref 36.0–46.0)
Hemoglobin: 11.3 g/dL — ABNORMAL LOW (ref 12.0–15.0)
MCH: 28.1 pg (ref 26.0–34.0)
MCHC: 32.7 g/dL (ref 30.0–36.0)
MCV: 86.1 fL (ref 80.0–100.0)
Platelets: 376 10*3/uL (ref 150–400)
RBC: 4.02 MIL/uL (ref 3.87–5.11)
RDW: 14.2 % (ref 11.5–15.5)
WBC: 10.2 10*3/uL (ref 4.0–10.5)
nRBC: 0 % (ref 0.0–0.2)

## 2022-05-22 LAB — LIPASE, BLOOD: Lipase: 44 U/L (ref 11–51)

## 2022-05-22 MED ORDER — LACTATED RINGERS IV BOLUS
1000.0000 mL | Freq: Once | INTRAVENOUS | Status: AC
  Administered 2022-05-22: 1000 mL via INTRAVENOUS

## 2022-05-22 MED ORDER — METHYLPREDNISOLONE SODIUM SUCC 125 MG IJ SOLR
125.0000 mg | Freq: Once | INTRAMUSCULAR | Status: AC
  Administered 2022-05-22: 125 mg via INTRAVENOUS
  Filled 2022-05-22: qty 2

## 2022-05-22 MED ORDER — ACETAMINOPHEN 500 MG PO TABS: 500.0000 mg | ORAL_TABLET | Freq: Four times a day (QID) | ORAL | 0 refills | Status: AC | PRN

## 2022-05-22 MED ORDER — ONDANSETRON HCL 4 MG/2ML IJ SOLN
4.0000 mg | Freq: Once | INTRAMUSCULAR | Status: AC
  Administered 2022-05-22: 4 mg via INTRAVENOUS
  Filled 2022-05-22: qty 2

## 2022-05-22 MED ORDER — MORPHINE SULFATE 30 MG PO TABS: 30.0000 mg | ORAL_TABLET | Freq: Two times a day (BID) | ORAL | 0 refills | Status: DC | PRN

## 2022-05-22 MED ORDER — PREDNISONE 10 MG PO TABS: 50.0000 mg | ORAL_TABLET | Freq: Every day | ORAL | 0 refills | Status: DC

## 2022-05-22 MED ORDER — FENTANYL CITRATE PF 50 MCG/ML IJ SOSY
50.0000 ug | PREFILLED_SYRINGE | INTRAMUSCULAR | Status: DC | PRN
  Administered 2022-05-22 (×2): 50 ug via INTRAVENOUS
  Filled 2022-05-22 (×2): qty 1

## 2022-05-22 NOTE — ED Provider Notes (Signed)
[facility] DEPT Provider Note   CSN: [id number] Arrival date & time: 05/22/22  1146     History  Chief Complaint  Patient presents with   Abdominal Pain    Sandra Foster is a 31 y.o. female.  HPI  31 year old female comes in with chief complaint of abdominal pain.  She has history of Crohn's colitis.  Patient started having abdominal pain 2 days ago.  Pain is located in the right lower quadrant and over time it has become more generalized.  Pain is described as throbbing pain.  She suspects that she has mild distention of her abdomen.  She had 2 bowel movements earlier today and is passing flatus.  No blood in the stool.  Denies any emesis, positive nausea.  Review of systems negative for any fevers, chills.  Patient was diagnosed with Crohn's disease earlier this year.  She is on Humira. Last admission was 7-7.  Home Medications Prior to Admission medications   Medication Sig Start Date End Date Taking? Authorizing Provider  acetaminophen (TYLENOL) 500 MG tablet Take 1 tablet (500 mg total) by mouth every 6 (six) hours as needed. 05/22/22  Yes Varney Biles, MD  morphine (MSIR) 30 MG tablet Take 1 tablet (30 mg total) by mouth every 12 (twelve) hours as needed for up to 9 doses for severe pain. 05/22/22  Yes Javoni Lucken, MD  predniSONE (DELTASONE) 10 MG tablet Take 5 tablets (50 mg total) by mouth daily. 05/22/22  Yes Jennetta Flood, MD  Adalimumab (HUMIRA PEN) 40 MG/0.4ML PNKT Inject 40 mg into the skin every 14 (fourteen) days.    [provider]  blood glucose meter kit and supplies Dispense based on patient and insurance preference. Use up to four times daily as directed. (FOR ICD-10 E10.9, E11.9). 04/26/22   Mercy Riding, MD  carboxymethylcellulose (REFRESH PLUS) 0.5 % SOLN Place 1 drop into both eyes daily as needed (dry eyes).    [provider]  etonogestrel (NEXPLANON) 68 MG IMPL implant 1 each by Subdermal route once.     [provider]  insulin aspart (NOVOLOG) 100 UNIT/ML FlexPen CBG < 70: Implement Hypoglycemia Standing Orders and refer to Hypoglycemia Standing Orders sidebar report CBG 70 - 120: 0 units CBG 121 - 150: 2 units CBG 151 - 200: 3 units CBG 201 - 250: 5 units CBG 251 - 300: 8 units CBG 301 - 350: 11 units CBG 351 - 400: 15 units CBG > 400: call MD and obtain STAT lab verification Patient taking differently: Inject 0-15 Units into the skin 3 (three) times daily with meals. CBG < 70: Implement Hypoglycemia Standing Orders and refer to Hypoglycemia Standing Orders sidebar report CBG 70 - 120: 0 units CBG 121 - 150: 2 units CBG 151 - 200: 3 units CBG 201 - 250: 5 units CBG 251 - 300: 8 units CBG 301 - 350: 11 units CBG 351 - 400: 15 units CBG > 400: call MD and obtain STAT lab verification. Sliding scale 04/26/22   Mercy Riding, MD  insulin glargine (LANTUS) 100 UNIT/ML Solostar Pen Inject 15 Units into the skin 2 (two) times daily. 04/26/22   Mercy Riding, MD  Insulin Pen Needle (PEN NEEDLES) 30G X 5 MM MISC 1 Pen by Does not apply route 4 (four) times daily -  before meals and at bedtime. 04/26/22   Mercy Riding, MD  nicotine (NICODERM CQ - DOSED IN MG/24 HOURS) 14 mg/24hr patch Place 1  patch (14 mg total) onto the skin daily. 04/26/22   Mercy Riding, MD  oxyCODONE (OXY IR/ROXICODONE) 5 MG immediate release tablet Take 1-2 tablets (5-10 mg total) by mouth every 4 (four) hours as needed for severe pain. 05/04/22   Cherene Altes, MD  pantoprazole (PROTONIX) 40 MG tablet Take 1 tablet (40 mg total) by mouth daily. 04/26/22 05/26/22  Mercy Riding, MD  amitriptyline (ELAVIL) 25 MG tablet Take 1-2 tablets (25-50 mg total) by mouth at bedtime as needed (headache). 03/25/20 06/18/20  Gregor Hams, MD      Allergies    Patient has no known allergies.    Review of Systems   Review of Systems  All other systems reviewed and are negative.   Physical Exam Updated Vital Signs BP 97/61   Pulse 63    Temp 98.4 F (36.9 C) (Oral)   Resp 18   Ht $R'5\' 6"'Nd$  (1.676 m)   SpO2 100%   BMI 22.11 kg/m  Physical Exam Vitals and nursing note reviewed.  Constitutional:      Appearance: She is well-developed.  HENT:     Head: Atraumatic.  Cardiovascular:     Rate and Rhythm: Normal rate.  Pulmonary:     Effort: Pulmonary effort is normal.  Abdominal:     Palpations: Abdomen is soft.     Tenderness: There is generalized abdominal tenderness. There is guarding. There is no rebound.  Musculoskeletal:     Cervical back: Normal range of motion and neck supple.  Skin:    General: Skin is warm and dry.  Neurological:     Mental Status: She is alert and oriented to person, place, and time.     ED Results / Procedures / Treatments   Labs (all labs ordered are listed, but only abnormal results are displayed) Labs Reviewed  COMPREHENSIVE METABOLIC PANEL - Abnormal; Notable for the following components:      Result Value   Potassium 3.3 (*)    Glucose, Bld 188 (*)    Calcium 8.4 (*)    Albumin 3.1 (*)    AST 12 (*)    All other components within normal limits  CBC - Abnormal; Notable for the following components:   Hemoglobin 11.3 (*)    HCT 34.6 (*)    All other components within normal limits  LIPASE, BLOOD  URINALYSIS, ROUTINE W REFLEX MICROSCOPIC  I-STAT BETA HCG BLOOD, ED ([facility], [facility], [facility] ONLY)    EKG None  Radiology DG Chest Port 1 View  Result Date: 05/22/2022 CLINICAL DATA:  Two days of lower abdominal pain, history of Crohn's disease EXAM: PORTABLE CHEST 1 VIEW COMPARISON:  Radiographs 06/02/2012 FINDINGS: The heart size and mediastinal contours are within normal limits. Both lungs are clear. The visualized skeletal structures are unremarkable. No free air under the diaphragms on semi-erect AP radiograph. IMPRESSION: No acute abnormality. Electronically Signed   By: Placido Sou M.D.   On: 05/22/2022 14:41    Procedures Procedures    Medications Ordered in ED Medications   fentaNYL (SUBLIMAZE) injection 50 mcg (50 mcg Intravenous Given 05/22/22 1410)  lactated ringers bolus 1,000 mL (0 mLs Intravenous Stopped 05/22/22 1430)  ondansetron (ZOFRAN) injection 4 mg (4 mg Intravenous Given 05/22/22 1308)  methylPREDNISolone sodium succinate (SOLU-MEDROL) 125 mg/2 mL injection 125 mg (125 mg Intravenous Given 05/22/22 1411)    ED Course/ Medical Decision Making/ A&P  Medical Decision Making Amount and/or Complexity of Data Reviewed Labs: ordered. Radiology: ordered.  Risk OTC drugs. Prescription drug management.   This patient presents to the ED with chief complaint(s) of abdominal pain with pertinent past medical history of Crohn's disease which further complicates the presenting complaint. The complaint involves an extensive differential diagnosis and also carries with it a high risk of complications and morbidity.    The differential diagnosis includes : Crohn's flareup, ileitis, colitis, ileus, small bowel obstruction.  Based on history, unlikely to be small bowel obstruction. I reviewed patient's prior CT scans, she has had 3 CT scans this year, all showing ileitis and right-sided colitis.  Suspicion is high that patient is having repeat episode of Crohn's flareup.  The initial plan is to get basic labs and we will discuss the case with her GI team.   Additional history obtained: Records reviewed previous admission documents  Independent labs interpretation:  The following labs were independently interpreted: Patient's CBC reveals normal white count.  Electrolytes are reassuring as well.  Independent visualization of imaging: - I independently visualized the following imaging with scope of interpretation limited to determining acute life threatening conditions related to emergency care: X-ray of the chest, which revealed no evidence of free air  Treatment and Reassessment: Patient reassessed. Repeat abdominal exam is  reassuring.  Discussed with her that she had 3 CT scans earlier this year which were all negative for complications from Crohn's disease.  She agrees, that most likely she is having a flareup without complication.  She is comfortable not getting CT scan. We discussed strict ER return precautions.  Discussed recommendations by Dr. Collene Mares which are stated below  Consultation: - Consulted or discussed management/test interpretation w/ external professional: Dr. Collene Mares -she recommends that if patient is having severe flare of symptoms, we can consider steroid.  If Humira is not working, she would like to see the patient in the clinic.    Final Clinical Impression(s) / ED Diagnoses Final diagnoses:  Crohn's disease of colon without complication ([place])    Rx / DC Orders ED Discharge Orders          Ordered    morphine (MSIR) 30 MG tablet  Every 12 hours PRN        05/22/22 1409    predniSONE (DELTASONE) 10 MG tablet  Daily        05/22/22 1409    acetaminophen (TYLENOL) 500 MG tablet  Every 6 hours PRN        05/22/22 1410              Varney Biles, MD 05/22/22 1508

## 2022-05-22 NOTE — Discharge Instructions (Signed)
You are seen in the ER for abdominal pain. We suspect that you might have Crohn's flareup.  Please take the medications prescribed for symptom relief.  Call Dr. Loreta Ave for appropriate follow-up.  As discussed, pain medicine should be used only for breakthrough pain if Tylenol is not working.  Pain medication can have adverse impact on your overall wellbeing.

## 2022-05-22 NOTE — ED Provider Triage Note (Signed)
Emergency Medicine Provider Triage Evaluation Note  Sandra Foster , a 31 y.o. female  was evaluated in triage.  Pt complains of increasing abdominal pain accompanied with nausea and urinary symptoms since Saturday.  Known Hx of Crohn, recently diagnosed a few months ago.  Still able to pass stool, no changes with bowel habits.  Notes dysuria and hematuria for the last few days.  Abdominal pain has migrated, but is overall constant.  Without vomiting, fevers, shortness of breath, chest pain.  No Hx of renal calculi.  Given 100 mcg fentanyl in route from EMS  Review of Systems  Positive:  Negative: See above  Physical Exam  BP (!) 105/57 (BP Location: Left Arm)   Pulse 98   Temp 98.4 F (36.9 C) (Oral)   Resp 18   Ht 5\' 6"  (1.676 m)   SpO2 98%   BMI 22.11 kg/m  Gen:   Awake, no distress   Resp:  Normal effort  MSK:   Moves extremities without difficulty  Other:  Abdomen soft, diffusely tender.  No flank pain.  Medical Decision Making  Medically screening exam initiated at 12:08 PM.  Appropriate orders placed.  was informed that the remainder of the evaluation will be completed by another provider, this initial triage assessment does not replace that evaluation, and the importance of remaining in the ED until their evaluation is complete.     Sandra Idol, PA-C 05/22/22 1213

## 2022-05-22 NOTE — ED Triage Notes (Signed)
Pt presents via EMS from home with c/o lower abdominal pain that has been ongoing for 2 days. Hx of chron's disease. Pt has a script for pain medicine, called her MD and was told to come here if she needed anything else, reports possibly needing pain management clinic help. 18 g right AC started by EMS, 100 mcg of Fentanyl given en route.

## 2022-05-22 NOTE — ED Notes (Signed)
Pt states understanding of dc instructions, importance of follow up, and prescriptions. Pt denies questions or concerns upon dc. Pt declined wheelchair assistance upon dc. Pt ambulated out of ed w/ steady gait. No belongings left in room upon dc.  

## 2022-05-22 NOTE — ED Notes (Signed)
Pt unable to provide urine sample at the moment. Pt aware sample is needed. Will attempt to collect later.

## 2022-05-28 DIAGNOSIS — R634 Abnormal weight loss: Secondary | ICD-10-CM | POA: Insufficient documentation

## 2022-05-28 DIAGNOSIS — K219 Gastro-esophageal reflux disease without esophagitis: Secondary | ICD-10-CM | POA: Diagnosis present

## 2022-06-01 ENCOUNTER — Inpatient Hospital Stay (HOSPITAL_COMMUNITY)
Admission: EM | Admit: 2022-06-01 | Discharge: 2022-06-08 | DRG: 871 | Disposition: A | Discharge status: Home / Self Care | Payer: Medicaid Other | Attending: Internal Medicine | Admitting: Internal Medicine

## 2022-06-01 ENCOUNTER — Encounter (HOSPITAL_COMMUNITY): Payer: Self-pay

## 2022-06-01 ENCOUNTER — Emergency Department (HOSPITAL_COMMUNITY): Payer: Medicaid Other

## 2022-06-01 DIAGNOSIS — Z886 Allergy status to analgesic agent status: Secondary | ICD-10-CM

## 2022-06-01 DIAGNOSIS — K50011 Crohn's disease of small intestine with rectal bleeding: Secondary | ICD-10-CM | POA: Diagnosis present

## 2022-06-01 DIAGNOSIS — N1330 Unspecified hydronephrosis: Secondary | ICD-10-CM | POA: Diagnosis present

## 2022-06-01 DIAGNOSIS — F17290 Nicotine dependence, other tobacco product, uncomplicated: Secondary | ICD-10-CM | POA: Diagnosis present

## 2022-06-01 DIAGNOSIS — K219 Gastro-esophageal reflux disease without esophagitis: Secondary | ICD-10-CM | POA: Diagnosis present

## 2022-06-01 DIAGNOSIS — K50014 Crohn's disease of small intestine with abscess: Secondary | ICD-10-CM | POA: Diagnosis not present

## 2022-06-01 DIAGNOSIS — K5900 Constipation, unspecified: Secondary | ICD-10-CM | POA: Diagnosis not present

## 2022-06-01 DIAGNOSIS — Z79899 Other long term (current) drug therapy: Secondary | ICD-10-CM | POA: Diagnosis not present

## 2022-06-01 DIAGNOSIS — E876 Hypokalemia: Secondary | ICD-10-CM | POA: Diagnosis not present

## 2022-06-01 DIAGNOSIS — Z794 Long term (current) use of insulin: Secondary | ICD-10-CM | POA: Diagnosis not present

## 2022-06-01 DIAGNOSIS — A419 Sepsis, unspecified organism: Principal | ICD-10-CM | POA: Diagnosis present

## 2022-06-01 DIAGNOSIS — T380X5A Adverse effect of glucocorticoids and synthetic analogues, initial encounter: Secondary | ICD-10-CM | POA: Diagnosis present

## 2022-06-01 DIAGNOSIS — E119 Type 2 diabetes mellitus without complications: Secondary | ICD-10-CM | POA: Diagnosis not present

## 2022-06-01 DIAGNOSIS — D509 Iron deficiency anemia, unspecified: Secondary | ICD-10-CM | POA: Diagnosis present

## 2022-06-01 DIAGNOSIS — Z7952 Long term (current) use of systemic steroids: Secondary | ICD-10-CM

## 2022-06-01 DIAGNOSIS — K651 Peritoneal abscess: Secondary | ICD-10-CM | POA: Diagnosis present

## 2022-06-01 DIAGNOSIS — Z720 Tobacco use: Secondary | ICD-10-CM | POA: Diagnosis present

## 2022-06-01 DIAGNOSIS — Z793 Long term (current) use of hormonal contraceptives: Secondary | ICD-10-CM

## 2022-06-01 DIAGNOSIS — K50914 Crohn's disease, unspecified, with abscess: Secondary | ICD-10-CM | POA: Diagnosis not present

## 2022-06-01 DIAGNOSIS — K5090 Crohn's disease, unspecified, without complications: Secondary | ICD-10-CM | POA: Diagnosis present

## 2022-06-01 DIAGNOSIS — Z716 Tobacco abuse counseling: Secondary | ICD-10-CM | POA: Diagnosis not present

## 2022-06-01 DIAGNOSIS — Z8719 Personal history of other diseases of the digestive system: Secondary | ICD-10-CM

## 2022-06-01 DIAGNOSIS — R1031 Right lower quadrant pain: Secondary | ICD-10-CM

## 2022-06-01 DIAGNOSIS — E1165 Type 2 diabetes mellitus with hyperglycemia: Secondary | ICD-10-CM | POA: Diagnosis present

## 2022-06-01 LAB — URINALYSIS, ROUTINE W REFLEX MICROSCOPIC
Bilirubin Urine: NEGATIVE
Glucose, UA: NEGATIVE mg/dL
Ketones, ur: 20 mg/dL — AB
Leukocytes,Ua: NEGATIVE
Nitrite: NEGATIVE
Protein, ur: NEGATIVE mg/dL
Specific Gravity, Urine: 1.011 (ref 1.005–1.030)
pH: 6 (ref 5.0–8.0)

## 2022-06-01 LAB — COMPREHENSIVE METABOLIC PANEL
ALT: 16 U/L (ref 0–44)
AST: 8 U/L — ABNORMAL LOW (ref 15–41)
Albumin: 3.1 g/dL — ABNORMAL LOW (ref 3.5–5.0)
Alkaline Phosphatase: 62 U/L (ref 38–126)
Anion gap: 11 (ref 5–15)
BUN: 9 mg/dL (ref 6–20)
CO2: 25 mmol/L (ref 22–32)
Calcium: 8.9 mg/dL (ref 8.9–10.3)
Chloride: 96 mmol/L — ABNORMAL LOW (ref 98–111)
Creatinine, Ser: 0.55 mg/dL (ref 0.44–1.00)
GFR, Estimated: >60 mL/min (ref >60)
Glucose, Bld: 185 mg/dL — ABNORMAL HIGH (ref 70–99)
Potassium: 3.5 mmol/L (ref 3.5–5.1)
Sodium: 132 mmol/L — ABNORMAL LOW (ref 135–145)
Total Bilirubin: 0.8 mg/dL (ref 0.3–1.2)
Total Protein: 6.6 g/dL (ref 6.5–8.1)

## 2022-06-01 LAB — CBC
HCT: 38.9 % (ref 36.0–46.0)
Hemoglobin: 12.7 g/dL (ref 12.0–15.0)
MCH: 27.5 pg (ref 26.0–34.0)
MCHC: 32.6 g/dL (ref 30.0–36.0)
MCV: 84.4 fL (ref 80.0–100.0)
Platelets: 348 10*3/uL (ref 150–400)
RBC: 4.61 MIL/uL (ref 3.87–5.11)
RDW: 15.7 % — ABNORMAL HIGH (ref 11.5–15.5)
WBC: 25.2 10*3/uL — ABNORMAL HIGH (ref 4.0–10.5)
nRBC: 0 % (ref 0.0–0.2)

## 2022-06-01 LAB — LIPASE, BLOOD: Lipase: 26 U/L (ref 11–51)

## 2022-06-01 LAB — I-STAT BETA HCG BLOOD, ED (MC, WL, AP ONLY): I-stat hCG, quantitative: <5 m[IU]/mL (ref <5)

## 2022-06-01 MED ORDER — ACETAMINOPHEN 325 MG PO TABS: 325.0000 mg | ORAL_TABLET | Freq: Four times a day (QID) | ORAL | Status: DC | PRN

## 2022-06-01 MED ORDER — DIPHENHYDRAMINE HCL 50 MG/ML IJ SOLN
12.5000 mg | Freq: Four times a day (QID) | INTRAMUSCULAR | Status: DC | PRN
  Administered 2022-06-02: 12.5 mg via INTRAVENOUS
  Administered 2022-06-05: 25 mg via INTRAVENOUS
  Filled 2022-06-01 (×2): qty 1

## 2022-06-01 MED ORDER — OXYCODONE HCL 5 MG PO TABS
5.0000 mg | ORAL_TABLET | ORAL | Status: DC | PRN
  Administered 2022-06-01 – 2022-06-06 (×15): 10 mg via ORAL
  Administered 2022-06-06 (×3): 5 mg via ORAL
  Administered 2022-06-07 – 2022-06-08 (×5): 10 mg via ORAL
  Filled 2022-06-01 (×6): qty 2
  Filled 2022-06-01: qty 1
  Filled 2022-06-01 (×2): qty 2
  Filled 2022-06-01: qty 1
  Filled 2022-06-01 (×3): qty 2
  Filled 2022-06-01: qty 1
  Filled 2022-06-01 (×9): qty 2

## 2022-06-01 MED ORDER — SODIUM CHLORIDE 0.9 % IV SOLN
2.0000 g | Freq: Once | INTRAVENOUS | Status: AC
  Administered 2022-06-01: 2 g via INTRAVENOUS
  Filled 2022-06-01: qty 20

## 2022-06-01 MED ORDER — MENTHOL 3 MG MT LOZG: 1.0000 | LOZENGE | OROMUCOSAL | Status: DC | PRN

## 2022-06-01 MED ORDER — LIP MEDEX EX OINT
TOPICAL_OINTMENT | Freq: Two times a day (BID) | CUTANEOUS | Status: DC
  Administered 2022-06-01 – 2022-06-07 (×4): 75 via TOPICAL
  Filled 2022-06-01 (×2): qty 7

## 2022-06-01 MED ORDER — MORPHINE SULFATE (PF) 4 MG/ML IV SOLN
4.0000 mg | Freq: Once | INTRAVENOUS | Status: AC
  Administered 2022-06-01: 4 mg via INTRAVENOUS
  Filled 2022-06-01: qty 1

## 2022-06-01 MED ORDER — ACETAMINOPHEN 650 MG RE SUPP: 650.0000 mg | Freq: Four times a day (QID) | RECTAL | Status: DC | PRN

## 2022-06-01 MED ORDER — IOHEXOL 300 MG/ML  SOLN
100.0000 mL | Freq: Once | INTRAMUSCULAR | Status: AC | PRN
  Administered 2022-06-01: 100 mL via INTRAVENOUS

## 2022-06-01 MED ORDER — LACTATED RINGERS IV SOLN: INTRAVENOUS | Status: DC

## 2022-06-01 MED ORDER — MAGIC MOUTHWASH: 15.0000 mL | Freq: Four times a day (QID) | ORAL | Status: DC | PRN

## 2022-06-01 MED ORDER — ALUM & MAG HYDROXIDE-SIMETH 200-200-20 MG/5ML PO SUSP: 30.0000 mL | Freq: Four times a day (QID) | ORAL | Status: DC | PRN

## 2022-06-01 MED ORDER — SODIUM CHLORIDE 0.9 % IV BOLUS (SEPSIS)
1000.0000 mL | Freq: Once | INTRAVENOUS | Status: AC
  Administered 2022-06-01: 1000 mL via INTRAVENOUS

## 2022-06-01 MED ORDER — SODIUM CHLORIDE 0.9 % IV SOLN: 8.0000 mg | Freq: Four times a day (QID) | INTRAVENOUS | Status: DC | PRN

## 2022-06-01 MED ORDER — METOPROLOL TARTRATE 5 MG/5ML IV SOLN: 5.0000 mg | Freq: Four times a day (QID) | INTRAVENOUS | Status: DC | PRN

## 2022-06-01 MED ORDER — METRONIDAZOLE 500 MG/100ML IV SOLN
500.0000 mg | Freq: Once | INTRAVENOUS | Status: AC
  Administered 2022-06-01: 500 mg via INTRAVENOUS
  Filled 2022-06-01: qty 100

## 2022-06-01 MED ORDER — HYDROMORPHONE HCL 1 MG/ML IJ SOLN
0.5000 mg | INTRAMUSCULAR | Status: DC | PRN
  Administered 2022-06-02 – 2022-06-07 (×17): 1 mg via INTRAVENOUS
  Administered 2022-06-07: 2 mg via INTRAVENOUS
  Administered 2022-06-08: 1 mg via INTRAVENOUS
  Filled 2022-06-01 (×6): qty 1
  Filled 2022-06-01: qty 2
  Filled 2022-06-01 (×12): qty 1

## 2022-06-01 MED ORDER — METHOCARBAMOL 1000 MG/10ML IJ SOLN
1000.0000 mg | Freq: Four times a day (QID) | INTRAVENOUS | Status: DC | PRN
  Administered 2022-06-02 – 2022-06-07 (×10): 1000 mg via INTRAVENOUS
  Filled 2022-06-01 (×5): qty 1000
  Filled 2022-06-01: qty 10
  Filled 2022-06-01 (×5): qty 1000

## 2022-06-01 MED ORDER — LACTATED RINGERS IV BOLUS
1000.0000 mL | Freq: Once | INTRAVENOUS | Status: AC
  Administered 2022-06-01: 1000 mL via INTRAVENOUS

## 2022-06-01 MED ORDER — PANTOPRAZOLE SODIUM 40 MG PO TBEC
40.0000 mg | DELAYED_RELEASE_TABLET | Freq: Two times a day (BID) | ORAL | Status: DC
  Administered 2022-06-02 – 2022-06-08 (×13): 40 mg via ORAL
  Filled 2022-06-01 (×13): qty 1

## 2022-06-01 MED ORDER — PIPERACILLIN-TAZOBACTAM 3.375 G IVPB
3.3750 g | Freq: Three times a day (TID) | INTRAVENOUS | Status: DC
  Administered 2022-06-02 – 2022-06-06 (×14): 3.375 g via INTRAVENOUS
  Filled 2022-06-01 (×13): qty 50

## 2022-06-01 MED ORDER — ONDANSETRON HCL 4 MG/2ML IJ SOLN: 4.0000 mg | Freq: Four times a day (QID) | INTRAMUSCULAR | Status: DC | PRN

## 2022-06-01 MED ORDER — ONDANSETRON HCL 4 MG/2ML IJ SOLN
4.0000 mg | Freq: Once | INTRAMUSCULAR | Status: AC
  Administered 2022-06-01: 4 mg via INTRAVENOUS
  Filled 2022-06-01: qty 2

## 2022-06-01 MED ORDER — LACTATED RINGERS IV BOLUS
1000.0000 mL | Freq: Three times a day (TID) | INTRAVENOUS | Status: AC | PRN
  Administered 2022-06-02: 1000 mL via INTRAVENOUS

## 2022-06-01 MED ORDER — PROCHLORPERAZINE EDISYLATE 10 MG/2ML IJ SOLN: 5.0000 mg | INTRAMUSCULAR | Status: DC | PRN

## 2022-06-01 MED ORDER — PHENOL 1.4 % MT LIQD: 2.0000 | OROMUCOSAL | Status: DC | PRN

## 2022-06-01 NOTE — ED Triage Notes (Signed)
Pt arrived via [facility] from home.   C/o sharp abdominal pain persistent for 2 months. Also reports mass in right lower quadrant.   This episode started 2-3 days.   Diagnosed with chron's disease and diabetes.   Also c/o dark smelling urine X1 week. Denies blood in urine.   Hx: constipation and takes morphine at home for chron's.   Denies injury or trauma.   Reports drinking plenty of fluid intakes and reports poor food intake.   P-110 CBG-202  A/ox4 Ambulatory with assistance

## 2022-06-01 NOTE — ED Notes (Signed)
Handoff given to [place], Charity fundraiser

## 2022-06-01 NOTE — ED Provider Notes (Signed)
[facility]-EMERGENCY DEPT Provider Note   CSN: [id number] Arrival date & time: 06/01/22  1536     History  Chief Complaint  Patient presents with   Abdominal Pain   Vaginal Bleeding    Sandra Foster is a 31 y.o. female.  Patient is a 31 year old female with past medical history of Crohn's disease presenting for complaints of abdominal pain.  She admits to abdominal pain located in the right lower quadrant with radiation to the right flank.  Patient admits to associated dysuria.  From started approximately 2 days ago.  Denies fevers or chills.  No nausea and decreased appetite.  No vomiting or diarrhea.  No previous abdominal surgical history.  Diagnosed with Crohn's disease several months ago.  Not currently taking steroids.  States she is currently on her menstrual cycle.  The history is provided by the patient. No language interpreter was used.  Abdominal Pain Associated symptoms: dysuria and vaginal bleeding   Associated symptoms: no chest pain, no chills, no cough, no fever, no hematuria, no shortness of breath, no sore throat and no vomiting   Vaginal Bleeding Associated symptoms: abdominal pain and dysuria   Associated symptoms: no back pain and no fever        Home Medications Prior to Admission medications   Medication Sig Start Date End Date Taking? Authorizing Provider  famotidine (PEPCID) 20 MG tablet Take 1 tablet (20 mg total) by mouth 2 (two) times daily. 10/13/18   Bethel Born, PA-C  ibuprofen (ADVIL) 800 MG tablet Take 1 tablet (800 mg total) by mouth 3 (three) times daily with meals. 11/16/21   Mardella Layman, MD  ondansetron (ZOFRAN-ODT) 4 MG disintegrating tablet Take 1 tablet (4 mg total) by mouth every 8 (eight) hours as needed for nausea or vomiting. 05/03/22   Long, Arlyss Repress, MD  promethazine-dextromethorphan (PROMETHAZINE-DM) 6.25-15 MG/5ML syrup Take 5 mLs by mouth 4 (four) times daily as needed for cough. 11/16/21   Mardella Layman, MD  ferrous sulfate (FERROUSUL) 325 (65 FE) MG tablet Take 1 tablet (325 mg total) by mouth 2 (two) times daily. 12/01/18 10/26/20  Anyanwu, Jethro Bastos, MD  iron polysaccharides (NIFEREX) 150 MG capsule Take 1 capsule (150 mg total) by mouth daily. 11/01/18 10/26/20  Donette Larry, CNM      Allergies    Patient has no known allergies.    Review of Systems   Review of Systems  Constitutional:  Negative for chills and fever.  HENT:  Negative for ear pain and sore throat.   Eyes:  Negative for pain and visual disturbance.  Respiratory:  Negative for cough and shortness of breath.   Cardiovascular:  Negative for chest pain and palpitations.  Gastrointestinal:  Positive for abdominal pain. Negative for vomiting.  Genitourinary:  Positive for dysuria and vaginal bleeding. Negative for hematuria.  Musculoskeletal:  Negative for arthralgias and back pain.  Skin:  Negative for color change and rash.  Neurological:  Negative for seizures and syncope.  All other systems reviewed and are negative.   Physical Exam Updated Vital Signs BP (!) 144/98 (BP Location: Right Arm)   Pulse (!) 122   Temp 99.5 F (37.5 C) (Oral)   Resp 20   Ht 5' (1.524 m)   Wt 79.4 kg   SpO2 100%   BMI 34.18 kg/m  Physical Exam Vitals and nursing note reviewed.  Constitutional:      General: She is not in acute distress.    Appearance: She  is well-developed.  HENT:     Head: Normocephalic and atraumatic.  Eyes:     Conjunctiva/sclera: Conjunctivae normal.  Cardiovascular:     Rate and Rhythm: Normal rate and regular rhythm.     Heart sounds: No murmur heard. Pulmonary:     Effort: Pulmonary effort is normal. No respiratory distress.     Breath sounds: Normal breath sounds.  Abdominal:     Palpations: Abdomen is soft.     Tenderness: There is abdominal tenderness in the right lower quadrant. There is guarding.  Musculoskeletal:        General: No swelling.     Cervical back: Neck supple.  Skin:     General: Skin is warm and dry.     Capillary Refill: Capillary refill takes less than 2 seconds.  Neurological:     Mental Status: She is alert.  Psychiatric:        Mood and Affect: Mood normal.     ED Results / Procedures / Treatments   Labs (all labs ordered are listed, but only abnormal results are displayed) Labs Reviewed  CBC WITH DIFFERENTIAL/PLATELET - Abnormal; Notable for the following components:      Result Value   RBC 3.53 (*)    Hemoglobin 9.6 (*)    HCT 30.3 (*)    RDW 16.5 (*)    Platelets 411 (*)    All other components within normal limits  COMPREHENSIVE METABOLIC PANEL - Abnormal; Notable for the following components:   CO2 21 (*)    Glucose, Bld 103 (*)    All other components within normal limits  LIPASE, BLOOD  URINALYSIS, ROUTINE W REFLEX MICROSCOPIC  I-STAT BETA HCG BLOOD, ED ([facility], [facility], [facility] ONLY)    EKG None  Radiology US Pelvis Complete  Result Date: 06/01/2022 CLINICAL DATA:  Pelvic pain for 2 months. Last menstrual period 04/03/2022 EXAM: TRANSABDOMINAL AND TRANSVAGINAL ULTRASOUND OF PELVIS DOPPLER ULTRASOUND OF OVARIES TECHNIQUE: Both transabdominal and transvaginal ultrasound examinations of the pelvis were performed. Transabdominal technique was performed for global imaging of the pelvis including uterus, ovaries, adnexal regions, and pelvic cul-de-sac. It was necessary to proceed with endovaginal exam following the transabdominal exam to visualize the uterus, endometrium, and ovaries. Color and duplex Doppler ultrasound was utilized to evaluate blood flow to the ovaries. COMPARISON:  Pelvic ultrasounds 05/03/2022 and 11/17/2018 FINDINGS: Uterus Measurements: 8.7 x 5.8 x 5.5 cm = volume: 146 mL. No fibroids or other mass visualized. Endometrium Thickness: 3 mm, within normal limits. No focal abnormality visualized. Right ovary Measurements: 2.8 x 1.6 x 1.6 cm = volume: 3.6 mL. Normal appearance/no adnexal mass. Left ovary Measurements: 2.5 x 2.4 x 2.5 =  volume: 7.8 mL. Normal appearance/no adnexal mass. Pulsed Doppler evaluation of both ovaries demonstrates normal low-resistance arterial and venous waveforms. Other findings Trace free fluid is seen within the pelvis. IMPRESSION: Normal pelvic ultrasound. Normal thickness of the endometrium at 3 mm, previously 17 mm. This may be due to a different phase in the patient's menstrual cycle. Electronically Signed   By: Neita Garnet M.D.   On: 06/01/2022 17:23   US Transvaginal Non-OB  Result Date: 06/01/2022 CLINICAL DATA:  Pelvic pain for 2 months. Last menstrual period 04/03/2022 EXAM: TRANSABDOMINAL AND TRANSVAGINAL ULTRASOUND OF PELVIS DOPPLER ULTRASOUND OF OVARIES TECHNIQUE: Both transabdominal and transvaginal ultrasound examinations of the pelvis were performed. Transabdominal technique was performed for global imaging of the pelvis including uterus, ovaries, adnexal regions, and pelvic cul-de-sac. It was necessary to proceed with  endovaginal exam following the transabdominal exam to visualize the uterus, endometrium, and ovaries. Color and duplex Doppler ultrasound was utilized to evaluate blood flow to the ovaries. COMPARISON:  Pelvic ultrasounds 05/03/2022 and 11/17/2018 FINDINGS: Uterus Measurements: 8.7 x 5.8 x 5.5 cm = volume: 146 mL. No fibroids or other mass visualized. Endometrium Thickness: 3 mm, within normal limits. No focal abnormality visualized. Right ovary Measurements: 2.8 x 1.6 x 1.6 cm = volume: 3.6 mL. Normal appearance/no adnexal mass. Left ovary Measurements: 2.5 x 2.4 x 2.5 = volume: 7.8 mL. Normal appearance/no adnexal mass. Pulsed Doppler evaluation of both ovaries demonstrates normal low-resistance arterial and venous waveforms. Other findings Trace free fluid is seen within the pelvis. IMPRESSION: Normal pelvic ultrasound. Normal thickness of the endometrium at 3 mm, previously 17 mm. This may be due to a different phase in the patient's menstrual cycle. Electronically Signed   By:  Neita Garnet M.D.   On: 06/01/2022 17:23   [place] Art/Ven Flow Abd Pelv Doppler  Result Date: 06/01/2022 CLINICAL DATA:  Pelvic pain for 2 months. Last menstrual period 04/03/2022 EXAM: TRANSABDOMINAL AND TRANSVAGINAL ULTRASOUND OF PELVIS DOPPLER ULTRASOUND OF OVARIES TECHNIQUE: Both transabdominal and transvaginal ultrasound examinations of the pelvis were performed. Transabdominal technique was performed for global imaging of the pelvis including uterus, ovaries, adnexal regions, and pelvic cul-de-sac. It was necessary to proceed with endovaginal exam following the transabdominal exam to visualize the uterus, endometrium, and ovaries. Color and duplex Doppler ultrasound was utilized to evaluate blood flow to the ovaries. COMPARISON:  Pelvic ultrasounds 05/03/2022 and 11/17/2018 FINDINGS: Uterus Measurements: 8.7 x 5.8 x 5.5 cm = volume: 146 mL. No fibroids or other mass visualized. Endometrium Thickness: 3 mm, within normal limits. No focal abnormality visualized. Right ovary Measurements: 2.8 x 1.6 x 1.6 cm = volume: 3.6 mL. Normal appearance/no adnexal mass. Left ovary Measurements: 2.5 x 2.4 x 2.5 = volume: 7.8 mL. Normal appearance/no adnexal mass. Pulsed Doppler evaluation of both ovaries demonstrates normal low-resistance arterial and venous waveforms. Other findings Trace free fluid is seen within the pelvis. IMPRESSION: Normal pelvic ultrasound. Normal thickness of the endometrium at 3 mm, previously 17 mm. This may be due to a different phase in the patient's menstrual cycle. Electronically Signed   By: Neita Garnet M.D.   On: 06/01/2022 17:23    Procedures .Critical Care  Performed by: Franne Forts, DO Authorized by: Franne Forts, DO   Critical care provider statement:    Critical care time (minutes):  100   Critical care was necessary to treat or prevent imminent or life-threatening deterioration of the following conditions:  Sepsis   Critical care was time spent personally by me on the  following activities:  Development of treatment plan with patient or surrogate, discussions with consultants, evaluation of patient's response to treatment, examination of patient, ordering and review of laboratory studies, ordering and review of radiographic studies, ordering and performing treatments and interventions, pulse oximetry, re-evaluation of patient's condition and review of old charts   Care discussed with comment:  GI, general sugery, and admitting team    Medications Ordered in ED Medications  HYDROcodone-acetaminophen (NORCO/VICODIN) 5-325 MG per tablet 1 tablet (1 tablet Oral Given 06/01/22 1613)  ondansetron (ZOFRAN-ODT) disintegrating tablet 4 mg (4 mg Oral Given 06/01/22 1613)    ED Course/ Medical Decision Making/ A&P                           Medical  Decision Making Amount and/or Complexity of Data Reviewed Radiology: ordered.   58:82 PM 31 year old female with past medical history of Crohn's disease presenting for complaints of abdominal pain.  Patient is alert and oriented x3, temperature of 99.5 F, tachycardia 122 bpm with otherwise stable vital signs.  Initial laboratory studies ordered by triage demonstrates leukocytosis of 26.  Concerns for sepsis at this time due to 2 SIRS criteria.  Code sepsis activated.  Patient given 1 L IV fluid bolus.  No signs of septic shock at this time.  Patient's primary complaint is in the right lower quadrant abdominal region.  On exam abdomen is tender but soft.  Guarding is present.  Concerns for appendicitis.  Intra-abdominal antibiotics including Rocephin and Flagyl ordered.  CT abdomen pending.  Urinalysis pending for dysuria.   10:22 PM CT abd/plevis demonstrate: Significant progression of inflammatory change in the right lower quadrant when compared with the prior exam. Multiple small enhancing air-fluid collections are identified suspicious for abscesses and likely related to prior perforation. Free pelvic fluid is noted as well.  These changes are consistent with the given clinical history of Crohn's and likely represent an acute flare with perforation and abscess formation.  Bilateral hydronephrosis and hydroureter worse on the left than the right likely related to local compression related to the inflammatory changes.  Local general surgery who recommends no acute surgical interventions at this time.  We will see patient in the morning.  I spoke with gastroenterology who recommends continued IV fluids and antibiotics.  Will see patient in the morning.  Recommends medicine admit.  I spoke to Dr. Emmit Pomfret who agrees to accept patient for admission.           Final Clinical Impression(s) / ED Diagnoses Sepsis RLQ abdominal pain Hydronephrosis H/O Crohn's disease Abdominal abscess   Rx / DC Orders ED Discharge Orders     None         Franne Forts, DO 06/01/22 2225

## 2022-06-01 NOTE — H&P (Signed)
History and Physical   [facility] [place] [facility] [place] Admission History and Physical [employer], D.O.    Patient Name: Sandra Foster MR#: [id number] Date of Birth: 1991/03/26 Date of Admission: 06/01/2022  Referring MD/NP/PA: Dr. Pearline Cables Primary Care Physician: Patient, No Pcp Per  Chief Complaint:  Chief Complaint  Patient presents with   Abdominal Pain    HPI: Sandra Foster is a 31 y.o. female with a known history of Crohn's disease, diabetes presents to the emergency department for evaluation of abdominal pain.  Patient was in a usual state of health until 2 days ago when she reports the onset of right lower quadrant abdominal pain radiating to the right flank and back. She states that she can feel a hard lump or mass that comes and goes, pain is described as severe.   She has lost weight due to pain, but denies associated fevers, chills, nausea vomiting diarrhea. Was recently on prednisone but has since stopped.   Of note she was diagnosed with Crohn's disease four months ago and was placed on steroids which have since been stopped and Humira.    Patient denies fevers/chills, weakness, dizziness, chest pain, shortness of breath, N/V/C/D, dysuria/frequency, changes in mental status.    Otherwise there has been no change in status. Patient has been taking medication as prescribed and there has been no recent change in medication or diet.  No recent antibiotics.  There has been no recent illness, hospitalizations, travel or sick contacts.    EMS/ED Course: Patient received morphine, Rocephin, Flagyl, Zosyn, Zofran, Protonix, lactated Ringer's and normal saline. Medical admission has been requested for further management of sepsis secondary to multiple intra-abdominal abscesses with perforation likely secondary to Crohn's disease.  General surgery and GI were contacted by the emergency department physician and will see the patient in consultation  Review of Systems:   CONSTITUTIONAL: Positive weight loss No fever/chills, fatigue, weakness, headache. EYES: No blurry or double vision. ENT: No tinnitus, postnasal drip, redness or soreness of the oropharynx. RESPIRATORY: No cough, dyspnea, wheeze.  No hemoptysis.  CARDIOVASCULAR: No chest pain, palpitations, syncope, orthopnea. No lower extremity edema.  GASTROINTESTINAL: Positive abdominal pain.  No nausea, vomiting, , diarrhea, constipation.  No hematemesis, melena or hematochezia. GENITOURINARY: No dysuria, frequency, hematuria. ENDOCRINE: No polyuria or nocturia. No heat or cold intolerance. HEMATOLOGY: No anemia, bruising, bleeding. INTEGUMENTARY: No rashes, ulcers, lesions. MUSCULOSKELETAL: No arthritis, gout,  NEUROLOGIC: No numbness, tingling, ataxia, seizure-type activity, weakness. PSYCHIATRIC: No anxiety, depression, insomnia.   Past Medical History:  Diagnosis Date   Chlamydia    Crohn's disease ([place])    Diabetes mellitus without complication ([place])    Fetal demise    hyperglycemic hyperosmolar nonketonic syndrome   Hernia    Pregnancy induced hypertension    Pyelonephritis     Past Surgical History:  Procedure Laterality Date   BIOPSY  01/09/2022   Procedure: BIOPSY;  Surgeon: Carol Amel, MD;  Location: [facility];  Service: Gastroenterology;;   COLONOSCOPY WITH PROPOFOL N/A 01/09/2022   Procedure: COLONOSCOPY WITH PROPOFOL;  Surgeon: Carol Genessa, MD;  Location: [facility];  Service: Gastroenterology;  Laterality: N/A;   HERNIA REPAIR       reports that she has quit smoking. Her smoking use included cigarettes. She smoked an average of .25 packs per day. She quit smokeless tobacco use about 10 years ago. She reports that she does not drink alcohol and does not use drugs.  Allergies  Allergen Reactions   Nsaids Other (  See Comments)    Crohn's disease = NO NSAIDs    Family History  Problem Relation Age of Onset   Hypertension Maternal Grandmother     Prior to  Admission medications   Medication Sig Start Date End Date Taking? Authorizing Provider  acetaminophen (TYLENOL) 500 MG tablet Take 1 tablet (500 mg total) by mouth every 6 (six) hours as needed. 05/22/22   Varney Biles, MD  Adalimumab (HUMIRA PEN) 40 MG/0.4ML PNKT Inject 40 mg into the skin every 14 (fourteen) days.    [provider]  blood glucose meter kit and supplies Dispense based on patient and insurance preference. Use up to four times daily as directed. (FOR ICD-10 E10.9, E11.9). 04/26/22   Mercy Riding, MD  carboxymethylcellulose (REFRESH PLUS) 0.5 % SOLN Place 1 drop into both eyes daily as needed (dry eyes).    [provider]  etonogestrel (NEXPLANON) 68 MG IMPL implant 1 each by Subdermal route once.    [provider]  insulin aspart (NOVOLOG) 100 UNIT/ML FlexPen CBG < 70: Implement Hypoglycemia Standing Orders and refer to Hypoglycemia Standing Orders sidebar report CBG 70 - 120: 0 units CBG 121 - 150: 2 units CBG 151 - 200: 3 units CBG 201 - 250: 5 units CBG 251 - 300: 8 units CBG 301 - 350: 11 units CBG 351 - 400: 15 units CBG > 400: call MD and obtain STAT lab verification Patient taking differently: Inject 0-15 Units into the skin 3 (three) times daily with meals. CBG < 70: Implement Hypoglycemia Standing Orders and refer to Hypoglycemia Standing Orders sidebar report CBG 70 - 120: 0 units CBG 121 - 150: 2 units CBG 151 - 200: 3 units CBG 201 - 250: 5 units CBG 251 - 300: 8 units CBG 301 - 350: 11 units CBG 351 - 400: 15 units CBG > 400: call MD and obtain STAT lab verification. Sliding scale 04/26/22   Mercy Riding, MD  insulin glargine (LANTUS) 100 UNIT/ML Solostar Pen Inject 15 Units into the skin 2 (two) times daily. 04/26/22   Mercy Riding, MD  Insulin Pen Needle (PEN NEEDLES) 30G X 5 MM MISC 1 Pen by Does not apply route 4 (four) times daily -  before meals and at bedtime. 04/26/22   Mercy Riding, MD  morphine (MSIR) 30 MG tablet Take 1 tablet (30 mg  total) by mouth every 12 (twelve) hours as needed for up to 9 doses for severe pain. 05/22/22   Varney Biles, MD  nicotine (NICODERM CQ - DOSED IN MG/24 HOURS) 14 mg/24hr patch Place 1 patch (14 mg total) onto the skin daily. 04/26/22   Mercy Riding, MD  oxyCODONE (OXY IR/ROXICODONE) 5 MG immediate release tablet Take 1-2 tablets (5-10 mg total) by mouth every 4 (four) hours as needed for severe pain. 05/04/22   Cherene Altes, MD  pantoprazole (PROTONIX) 40 MG tablet Take 1 tablet (40 mg total) by mouth daily. 04/26/22 05/26/22  Mercy Riding, MD  predniSONE (DELTASONE) 10 MG tablet Take 5 tablets (50 mg total) by mouth daily. 05/22/22   Varney Biles, MD  amitriptyline (ELAVIL) 25 MG tablet Take 1-2 tablets (25-50 mg total) by mouth at bedtime as needed (headache). 03/25/20 06/18/20  Gregor Hams, MD    Physical Exam: Vitals:   06/01/22 1945 06/01/22 2102 06/01/22 2130 06/01/22 2200  BP: 117/69 106/67 102/62 98/70  Pulse: (!) 106 99 (!) 105 (!) 102  Resp: 18 18  18  Temp:    99 F (37.2 C)  TempSrc:      SpO2: 97% 98% 100% 99%  Weight:    61 kg  Height:    '5\' 6"'$  (1.676 m)    GENERAL: 31 y.o.-year-old black female patient, well-developed, well-nourished lying in the bed in no acute distress.  Pleasant and cooperative.   HEENT: Head atraumatic, normocephalic. Pupils equal. Mucus membranes moist. NECK: Supple. No JVD. CHEST: Normal breath sounds bilaterally. No wheezing, rales, rhonchi or crackles. No use of accessory muscles of respiration.  No reproducible chest wall tenderness.  CARDIOVASCULAR: S1, S2 normal. No murmurs, rubs, or gallops. Cap refill <2 seconds. Pulses intact distally.  ABDOMEN: Soft, nondistended.  Right lower quadrant tenderness to palpation. Voluntary guarding. Normoactive bowel sounds present in all four quadrants.  EXTREMITIES: No pedal edema, cyanosis, or clubbing. No calf tenderness or Homan's sign.  NEUROLOGIC: The patient is alert and oriented x 3. Cranial  nerves II through XII are grossly intact with no focal sensorimotor deficit. PSYCHIATRIC:  Normal affect, mood, thought content. SKIN: Warm, dry, and intact without obvious rash, lesion, or ulcer.    Labs on Admission:  CBC: Recent Labs  Lab 06/01/22 1529  WBC 25.2*  HGB 12.7  HCT 38.9  MCV 84.4  PLT 846   Basic Metabolic Panel: Recent Labs  Lab 06/01/22 1529  NA 132*  K 3.5  CL 96*  CO2 25  GLUCOSE 185*  BUN 9  CREATININE 0.55  CALCIUM 8.9   GFR: Estimated Creatinine Clearance: 96.3 mL/min (by C-G formula based on SCr of 0.55 mg/dL). Liver Function Tests: Recent Labs  Lab 06/01/22 1529  AST 8*  ALT 16  ALKPHOS 62  BILITOT 0.8  PROT 6.6  ALBUMIN 3.1*   Recent Labs  Lab 06/01/22 1529  LIPASE 26   No results for input(s): "AMMONIA" in the last 168 hours. Coagulation Profile: No results for input(s): "INR", "PROTIME" in the last 168 hours. Cardiac Enzymes: No results for input(s): "CKTOTAL", "CKMB", "CKMBINDEX", "TROPONINI" in the last 168 hours. BNP (last 3 results) No results for input(s): "PROBNP" in the last 8760 hours. HbA1C: No results for input(s): "HGBA1C" in the last 72 hours. CBG: No results for input(s): "GLUCAP" in the last 168 hours. Lipid Profile: No results for input(s): "CHOL", "HDL", "LDLCALC", "TRIG", "CHOLHDL", "LDLDIRECT" in the last 72 hours. Thyroid Function Tests: No results for input(s): "TSH", "T4TOTAL", "FREET4", "T3FREE", "THYROIDAB" in the last 72 hours. Anemia Panel: No results for input(s): "VITAMINB12", "FOLATE", "FERRITIN", "TIBC", "IRON", "RETICCTPCT" in the last 72 hours. Urine analysis:    Component Value Date/Time   COLORURINE YELLOW 06/01/2022 1905   APPEARANCEUR HAZY (A) 06/01/2022 1905   LABSPEC 1.011 06/01/2022 1905   PHURINE 6.0 06/01/2022 1905   GLUCOSEU NEGATIVE 06/01/2022 1905   HGBUR MODERATE (A) 06/01/2022 1905   BILIRUBINUR NEGATIVE 06/01/2022 1905   KETONESUR 20 (A) 06/01/2022 1905   PROTEINUR  NEGATIVE 06/01/2022 1905   UROBILINOGEN 0.2 08/11/2015 2134   NITRITE NEGATIVE 06/01/2022 1905   LEUKOCYTESUR NEGATIVE 06/01/2022 1905   Sepsis Labs: $[email or web address]'[id number]$ (procalcitonin:4,lacticidven:4) )No results found for this or any previous visit (from the past 240 hour(s)).   Radiological Exams on Admission: CT ABDOMEN PELVIS W CONTRAST  Result Date: 06/01/2022 CLINICAL DATA:  Right lower quadrant pain for 3 days, history of Crohn's disease EXAM: CT ABDOMEN AND PELVIS WITH CONTRAST TECHNIQUE: Multidetector CT imaging of the abdomen and pelvis was performed using the standard protocol following bolus administration of intravenous contrast. RADIATION DOSE  REDUCTION: This exam was performed according to the departmental dose-optimization program which includes automated exposure control, adjustment of the mA and/or kV according to patient size and/or use of iterative reconstruction technique. CONTRAST:  121mL OMNIPAQUE IOHEXOL 300 MG/ML  SOLN COMPARISON:  05/03/2022 FINDINGS: Lower chest: No acute abnormality. Hepatobiliary: No focal liver abnormality is seen. No gallstones, gallbladder wall thickening, or biliary dilatation. Pancreas: Unremarkable. No pancreatic ductal dilatation or surrounding inflammatory changes. Spleen: Normal in size without focal abnormality. Adrenals/Urinary Tract: Adrenal glands are within normal limits. Kidneys demonstrate a normal enhancement pattern bilaterally. The bladder is incompletely distended. There is however significant distension of the collecting systems and ureters bilaterally likely related to local compression by inflammatory changes in the pelvis. These changes are new from the prior exam. No calculi are identified. Stomach/Bowel: Mild fecal material is noted within the colon. The cecum and distal terminal ileum demonstrates significant inflammatory change much worse than that noted on the prior exam consistent with the known history of Crohn's. The appendix is within  normal limits. The distal most aspect of the small bowel is mildly dilated related to the inflammatory changes. A few small air-fluid collections are identified 1 seen on image number 55 of series 2 measuring 2.2 cm and a second measuring 2.3 cm on image number 62 of series 2. These are highly suspicious for focal abscesses and new from the prior exam. An additional area measuring up to 3 cm best seen on image number 64 of series 2 and image number 43 of series 5 is also suspicious for abscess. More proximal small bowel is within normal limits. The stomach is unremarkable. Vascular/Lymphatic: No significant vascular findings are present. No enlarged abdominal or pelvic lymph nodes. Reproductive: Uterus and bilateral adnexa are unremarkable. Other: No hernia is noted.  Free fluid is noted within the pelvis. Musculoskeletal: No acute or significant osseous findings. IMPRESSION: Significant progression of inflammatory change in the right lower quadrant when compared with the prior exam. Multiple small enhancing air-fluid collections are identified suspicious for abscesses and likely related to prior perforation. Free pelvic fluid is noted as well. These changes are consistent with the given clinical history of Crohn's and likely represent an acute flare with perforation and abscess formation. Bilateral hydronephrosis and hydroureter worse on the left than the right likely related to local compression related to the inflammatory changes. Electronically Signed   By: Inez Catalina M.D.   On: 06/01/2022 20:19     Assessment/Plan  This is a 31 y.o. female with a history of diabetes, Crohn's now being admitted with:  #.  Sepsis secondary to multiple intra-abdominal abscesses/perforation likely related to Crohn's flare -Admit to inpatient - IV fluids and antibiotics-Rocephin/Flagyl - Pain control - Stool cultures -We will keep n.p.o. in case of the need for procedure or surgical intervention - GI and surgery have  been contacted by the emergency department physician and will see the patient in consultation.  They recommend IV fluids, antibiotics and no steroids  #.  Bilateral hydronephrosis and hydroureter, left greater than right likely late related to local compression -Monitor urine output, -Consider urology if warranted  #. H/o Diabetes - Accuchecks q4h with RISS coverage -Continue Lantus at half dose  Admission status: Inpatient IV Fluids: Normal saline Diet/Nutrition: N.p.o. Consults called: GI and surgery DVT Px: SCDs and early ambulation. Code Status: Full Code  Disposition Plan: To home in 2-3 days  All the records are reviewed and case discussed with ED provider. Management plans discussed with  the patient and/or family who express understanding and agree with plan of care.  Shakoya Gilmore D.O. on 06/01/2022 at 10:18 PM CC: Primary care physician; Patient, No Pcp Per   06/01/2022, 10:18 PM

## 2022-06-01 NOTE — Progress Notes (Addendum)
JAHNI PAUL  1990/11/04 [id number]  Patient Care Team: Patient, No Pcp Per as PCP - General (General Practice) Juanita Craver, MD as Consulting Physician (Gastroenterology) Frederico Hamman, MD as Referring Physician (Obstetrics and Gynecology) Jorge Ny, PA-C (Physician Assistant)  Reason for call: Abnormal CT scan   Patient with multiple medical problems including insulin requiring diabetes, GERD, iron deficiency anemia.  Has had worsening episodes of crampy abdominal pain in March was diagnosed with ileitis & workup highly suspicious for Crohn's disease.  Followed by Dr. Juanita Craver with [facility].  Was placed on steroid taper and started on Humira.  Readmitted in June and in July with issues.  Steroid redosing with some improvement.  Currently having worsening abdominal pain.  CT scan notes significant inflammation with probable abscesses as well.  Based on the abnormal CAT scan, emergency department called surgery first.  CT scan does show dilated bowel and ileal inflammation with free fluid.  Few small pockets suspicious for abscesses and perhaps internal fistulae.  Seems more progressive than the CAT scan last month.  I recommended medical admission and gastrology reconsultation to help evaluate the patient.  IV antibiotics is reasonable.  Surgery will help follow in consultation.  She may be reaching a point where her disease cannot be controlled with immunosuppression and antibiotics.  However, operation right now would lead to significant intestinal resection with end ileostomy and increased risks given her immunosuppressed and probable malnourished anemic state.  It may come to that but it is not emergency tonight.  Will be better to try and control abscesses infection with IV antibiotics and perhaps drains as well as immunosuppression to minimize the Crohn's disease before having to progress to surgery to minimize over resection and other complications.  Would  like input from the medicine team that has admitted her in the past and the gastroenterology service that has been trying to work with an outpatient immunosuppressive regimen.  Do not know how successful that has been in compliance or stage.  She is not in shock, there is no massive pneumoperitoneum, she is not obstipated with evidence of flatus and bowel movements in the past 24 hours.  Surgery will follow.  Full consult tomorrow.  Patient Active Problem List   Diagnosis Date Noted   Crohn's disease of ileum, with abscess ([place]) 04/22/2022    Priority: High   Abnormal weight loss 05/28/2022   Gastroesophageal reflux disease 05/28/2022   Acute Crohn's disease ([place]) 05/03/2022   Iron deficiency anemia 04/24/2022   Normocytic anemia 04/23/2022   Hypotension 04/23/2022   Uncontrolled NIDDM-2 with hyperglycemia 04/23/2022   Tobacco abuse 04/22/2022   Dehydration 01/09/2022   Fistula of intestine, 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   Abdominal pain, acute, right lower quadrant 01/08/2022   Hypokalemia 01/08/2022   Diarrhea 01/08/2022   NVD (normal vaginal delivery) 09/13/2015   Diabetes in undelivered pregnancy 08/30/2015   Insulin-requiring or dependent type II diabetes mellitus ([place]) 08/29/2015   [redacted] weeks gestation of pregnancy    Prior pregnancy with fetal demise and current pregnancy in second trimester    Encounter for fetal anatomic survey     Past Medical History:  Diagnosis Date   Chlamydia    Crohn's disease ([place])    Diabetes mellitus without complication ([place])    Fetal demise    hyperglycemic hyperosmolar nonketonic syndrome   Hernia    Pregnancy induced hypertension    Pyelonephritis     Past Surgical History:  Procedure Laterality Date  BIOPSY  01/09/2022   Procedure: BIOPSY;  Surgeon: Carol Morayo, MD;  Location: [facility] ENDOSCOPY;  Service: Gastroenterology;;   COLONOSCOPY WITH PROPOFOL N/A 01/09/2022   Procedure: COLONOSCOPY WITH PROPOFOL;  Surgeon: Carol Demiya,  MD;  Location: [place];  Service: Gastroenterology;  Laterality: N/A;   HERNIA REPAIR      Social History   Socioeconomic History   Marital status: Significant Other    Spouse name: Not on file   Number of children: Not on file   Years of education: Not on file   Highest education level: Not on file  Occupational History   Not on file  Tobacco Use   Smoking status: Former    Packs/day: 0.25    Types: Cigarettes   Smokeless tobacco: Former    Quit date: 06/10/2011  Vaping Use   Vaping Use: Some days   Substances: Nicotine  Substance and Sexual Activity   Alcohol use: No   Drug use: No   Sexual activity: Yes    Birth control/protection: None  Other Topics Concern   Not on file  Social History Narrative   Not on file   Social Determinants of Health   Financial Resource Strain: Not on file  Food Insecurity: Not on file  Transportation Needs: Not on file  Physical Activity: Not on file  Stress: Not on file  Social Connections: Not on file  Intimate Partner Violence: Not on file    Family History  Problem Relation Age of Onset   Hypertension Maternal Grandmother     Current Facility-Administered Medications  Medication Dose Route Frequency Provider Last Rate Last Admin   acetaminophen (TYLENOL) suppository 650 mg  650 mg Rectal Q6H PRN Michael Boston, MD       acetaminophen (TYLENOL) tablet 325-650 mg  325-650 mg Oral Q6H PRN Michael Boston, MD       alum & mag hydroxide-simeth (MAALOX/MYLANTA) 200-200-20 MG/5ML suspension 30 mL  30 mL Oral Q6H PRN Michael Boston, MD       diphenhydrAMINE (BENADRYL) injection 12.5-25 mg  12.5-25 mg Intravenous Q6H PRN Michael Boston, MD       HYDROmorphone (DILAUDID) injection 0.5-2 mg  0.5-2 mg Intravenous Q2H PRN Alnita Aybar, Remo Lipps, MD       lactated ringers bolus 1,000 mL  1,000 mL Intravenous Once Michael Boston, MD       lactated ringers bolus 1,000 mL  1,000 mL Intravenous Q8H PRN Auryn Paige, Remo Lipps, MD       lactated ringers infusion    Intravenous Continuous Lianne Cure, DO 627 mL/hr at 06/01/22 1932 New Bag at 06/01/22 1932   lip balm (CARMEX) ointment   Topical BID Michael Boston, MD       magic mouthwash  15 mL Oral QID PRN Michael Boston, MD       menthol-cetylpyridinium (CEPACOL) lozenge 3 mg  1 lozenge Oral PRN Michael Boston, MD       methocarbamol (ROBAXIN) 1,000 mg in dextrose 5 % 100 mL IVPB  1,000 mg Intravenous Q6H PRN Michael Boston, MD       metoprolol tartrate (LOPRESSOR) injection 5 mg  5 mg Intravenous Q6H PRN Michael Boston, MD       ondansetron (ZOFRAN) injection 4 mg  4 mg Intravenous Q6H PRN Michael Boston, MD       Or   ondansetron (ZOFRAN) 8 mg in sodium chloride 0.9 % 50 mL IVPB  8 mg Intravenous Q6H PRN Michael Boston, MD       oxyCODONE (Oxy  IR/ROXICODONE) immediate release tablet 5-10 mg  5-10 mg Oral Q4H PRN Michael Boston, MD       phenol (CHLORASEPTIC) mouth spray 2 spray  2 spray Mouth/Throat PRN Michael Boston, MD       Derrill Memo ON 06/02/2022] piperacillin-tazobactam (ZOSYN) IVPB 3.375 g  3.375 g Intravenous Tor Netters, MD       prochlorperazine (COMPAZINE) injection 5-10 mg  5-10 mg Intravenous Q4H PRN Michael Boston, MD       Current Outpatient Medications  Medication Sig Dispense Refill   acetaminophen (TYLENOL) 500 MG tablet Take 1 tablet (500 mg total) by mouth every 6 (six) hours as needed. 30 tablet 0   Adalimumab (HUMIRA PEN) 40 MG/0.4ML PNKT Inject 40 mg into the skin every 14 (fourteen) days.     blood glucose meter kit and supplies Dispense based on patient and insurance preference. Use up to four times daily as directed. (FOR ICD-10 E10.9, E11.9). 1 each 0   carboxymethylcellulose (REFRESH PLUS) 0.5 % SOLN Place 1 drop into both eyes daily as needed (dry eyes).     etonogestrel (NEXPLANON) 68 MG IMPL implant 1 each by Subdermal route once.     insulin aspart (NOVOLOG) 100 UNIT/ML FlexPen CBG < 70: Implement Hypoglycemia Standing Orders and refer to Hypoglycemia Standing Orders sidebar  report CBG 70 - 120: 0 units CBG 121 - 150: 2 units CBG 151 - 200: 3 units CBG 201 - 250: 5 units CBG 251 - 300: 8 units CBG 301 - 350: 11 units CBG 351 - 400: 15 units CBG > 400: call MD and obtain STAT lab verification (Patient taking differently: Inject 0-15 Units into the skin 3 (three) times daily with meals. CBG < 70: Implement Hypoglycemia Standing Orders and refer to Hypoglycemia Standing Orders sidebar report CBG 70 - 120: 0 units CBG 121 - 150: 2 units CBG 151 - 200: 3 units CBG 201 - 250: 5 units CBG 251 - 300: 8 units CBG 301 - 350: 11 units CBG 351 - 400: 15 units CBG > 400: call MD and obtain STAT lab verification. Sliding scale) 15 mL 11   insulin glargine (LANTUS) 100 UNIT/ML Solostar Pen Inject 15 Units into the skin 2 (two) times daily. 15 mL 2   Insulin Pen Needle (PEN NEEDLES) 30G X 5 MM MISC 1 Pen by Does not apply route 4 (four) times daily -  before meals and at bedtime. 100 each 1   morphine (MSIR) 30 MG tablet Take 1 tablet (30 mg total) by mouth every 12 (twelve) hours as needed for up to 9 doses for severe pain. 9 tablet 0   nicotine (NICODERM CQ - DOSED IN MG/24 HOURS) 14 mg/24hr patch Place 1 patch (14 mg total) onto the skin daily. 28 patch 0   oxyCODONE (OXY IR/ROXICODONE) 5 MG immediate release tablet Take 1-2 tablets (5-10 mg total) by mouth every 4 (four) hours as needed for severe pain. 30 tablet 0   pantoprazole (PROTONIX) 40 MG tablet Take 1 tablet (40 mg total) by mouth daily. 30 tablet 0   predniSONE (DELTASONE) 10 MG tablet Take 5 tablets (50 mg total) by mouth daily. 25 tablet 0     No Known Allergies  BP 98/70   Pulse (!) 102   Temp 99 F (37.2 C)   Resp 18   Ht _0  (1.676 m)   Wt 61 kg   SpO2 99%   BMI 21.71 kg/m   CT ABDOMEN PELVIS W CONTRAST  Result Date: 06/01/2022 CLINICAL DATA:  Right lower quadrant pain for 3 days, history of Crohn's disease EXAM: CT ABDOMEN AND PELVIS WITH CONTRAST TECHNIQUE: Multidetector CT imaging of the abdomen and  pelvis was performed using the standard protocol following bolus administration of intravenous contrast. RADIATION DOSE REDUCTION: This exam was performed according to the departmental dose-optimization program which includes automated exposure control, adjustment of the mA and/or kV according to patient size and/or use of iterative reconstruction technique. CONTRAST:  164m OMNIPAQUE IOHEXOL 300 MG/ML  SOLN COMPARISON:  05/03/2022 FINDINGS: Lower chest: No acute abnormality. Hepatobiliary: No focal liver abnormality is seen. No gallstones, gallbladder wall thickening, or biliary dilatation. Pancreas: Unremarkable. No pancreatic ductal dilatation or surrounding inflammatory changes. Spleen: Normal in size without focal abnormality. Adrenals/Urinary Tract: Adrenal glands are within normal limits. Kidneys demonstrate a normal enhancement pattern bilaterally. The bladder is incompletely distended. There is however significant distension of the collecting systems and ureters bilaterally likely related to local compression by inflammatory changes in the pelvis. These changes are new from the prior exam. No calculi are identified. Stomach/Bowel: Mild fecal material is noted within the colon. The cecum and distal terminal ileum demonstrates significant inflammatory change much worse than that noted on the prior exam consistent with the known history of Crohn's. The appendix is within normal limits. The distal most aspect of the small bowel is mildly dilated related to the inflammatory changes. A few small air-fluid collections are identified 1 seen on image number 55 of series 2 measuring 2.2 cm and a second measuring 2.3 cm on image number 62 of series 2. These are highly suspicious for focal abscesses and new from the prior exam. An additional area measuring up to 3 cm best seen on image number 64 of series 2 and image number 43 of series 5 is also suspicious for abscess. More proximal small bowel is within normal  limits. The stomach is unremarkable. Vascular/Lymphatic: No significant vascular findings are present. No enlarged abdominal or pelvic lymph nodes. Reproductive: Uterus and bilateral adnexa are unremarkable. Other: No hernia is noted.  Free fluid is noted within the pelvis. Musculoskeletal: No acute or significant osseous findings. IMPRESSION: Significant progression of inflammatory change in the right lower quadrant when compared with the prior exam. Multiple small enhancing air-fluid collections are identified suspicious for abscesses and likely related to prior perforation. Free pelvic fluid is noted as well. These changes are consistent with the given clinical history of Crohn's and likely represent an acute flare with perforation and abscess formation. Bilateral hydronephrosis and hydroureter worse on the left than the right likely related to local compression related to the inflammatory changes. Electronically Signed   By: MInez CatalinaM.D.   On: 06/01/2022 20:19   DG Chest Port 1 View  Result Date: 05/22/2022 CLINICAL DATA:  Two days of lower abdominal pain, history of Crohn's disease EXAM: PORTABLE CHEST 1 VIEW COMPARISON:  Radiographs 06/02/2012 FINDINGS: The heart size and mediastinal contours are within normal limits. Both lungs are clear. The visualized skeletal structures are unremarkable. No free air under the diaphragms on semi-erect AP radiograph. IMPRESSION: No acute abnormality. Electronically Signed   By: TPlacido SouM.D.   On: 05/22/2022 14:41   CT Abdomen Pelvis Wo Contrast  Result Date: 05/03/2022 CLINICAL DATA:  RLQ abdominal pain (Age >= 14y) EXAM: CT ABDOMEN AND PELVIS WITHOUT CONTRAST TECHNIQUE: Multidetector CT imaging of the abdomen and pelvis was performed following the standard protocol without IV contrast. RADIATION DOSE REDUCTION: This exam was  performed according to the departmental dose-optimization program which includes automated exposure control, adjustment of the mA  and/or kV according to patient size and/or use of iterative reconstruction technique. COMPARISON:  March 2023 FINDINGS: Lower chest: No acute abnormality. Hepatobiliary: No focal liver abnormality is seen. No gallstones, gallbladder wall thickening, or biliary dilatation. Pancreas: Unremarkable. Spleen: Unremarkable. Adrenals/Urinary Tract: Adrenals, kidneys, and poorly distended bladder are unremarkable. Stomach/Bowel: Stomach is within normal limits. There are inflammatory changes in the right lower quadrant about the terminal ileum. Dilatation of distal ileal loops reflecting associated ileus or obstruction. Appendix appears normal. Vascular/Lymphatic: No significant vascular abnormality on this noncontrast study. Likely reactive mesenteric nodes. Reproductive: Uterus and bilateral adnexa are unremarkable. Other: No free air. Musculoskeletal: No acute osseous abnormality. IMPRESSION: Suboptimal evaluation on this study performed without intravenous or enteric contrast. Right lower quadrant inflammatory changes likely reflecting persistent or recurrent terminal ileitis. Dilatation of distal ileal loops reflects associated ileus or obstruction. Electronically Signed   By: Macy Mis M.D.   On: 05/03/2022 12:15    Note:  This dictation was prepared with Dragon/digital dictation along with [employer]. Any transcriptional errors that result from this process are unintentional.    Adin Hector, MD, FACS, MASCRS Esophageal, Gastrointestinal & Colorectal Surgery Robotic and Minimally Invasive Surgery  [facility]. [address], [place], [place] [address] [phone] Fax [phone] Main  CONTACT INFORMATION:  Weekday (9AM-5PM): Call [facility] main office at [phone]  Weeknight (5PM-9AM) or Weekend/Holiday: Check [email or web address] (password " [email or web address]") for General Surgery [facility] coverage  (Please, do not use SecureChat as it is  not reliable communication to reach operating surgeons for immediate patient care)      06/01/2022  10:15 PM

## 2022-06-01 NOTE — Progress Notes (Signed)
Multiple secure chats sent pertaining to Lactic acid draws for Sepsis protocol. Further inspection noted that Lactic acid draws are on hold. Will continue observation.

## 2022-06-01 NOTE — Progress Notes (Signed)
Pt being followed by [facility] for Sepsis protocol. 

## 2022-06-01 NOTE — ED Provider Triage Note (Signed)
Emergency Medicine Provider Triage Evaluation Note  Sandra Foster , a 31 y.o. female  was evaluated in triage.  Pt complains of right lower quadrant abdominal pain.  Has history of Crohn's and this is typical for her Crohn's flareup.  States her medications are not helping.  Denies fever.  States episode started 3 days ago.  Reports blood in her stool yesterday.  Review of Systems  Positive: As above Negative: As above  Physical Exam  BP 103/70 (BP Location: Left Arm)   Pulse (!) 113   Temp 99.8 F (37.7 C) (Oral)   Resp 20   SpO2 93%  Gen:   Awake, no distress   Resp:  Normal effort  MSK:   Moves extremities without difficulty Other:  Diffuse abdominal tenderness present with guarding  Medical Decision Making  Medically screening exam initiated at 3:16 PM.  Appropriate orders placed.  Sandra Foster was informed that the remainder of the evaluation will be completed by another provider, this initial triage assessment does not replace that evaluation, and the importance of remaining in the ED until their evaluation is complete.    Marita Kansas, PA-C 06/01/22 [phone]

## 2022-06-02 ENCOUNTER — Inpatient Hospital Stay (HOSPITAL_COMMUNITY): Payer: Medicaid Other

## 2022-06-02 ENCOUNTER — Other Ambulatory Visit: Payer: Self-pay

## 2022-06-02 ENCOUNTER — Encounter (HOSPITAL_COMMUNITY): Payer: Self-pay | Admitting: Family Medicine

## 2022-06-02 DIAGNOSIS — Z794 Long term (current) use of insulin: Secondary | ICD-10-CM

## 2022-06-02 DIAGNOSIS — D509 Iron deficiency anemia, unspecified: Secondary | ICD-10-CM

## 2022-06-02 DIAGNOSIS — E1165 Type 2 diabetes mellitus with hyperglycemia: Secondary | ICD-10-CM

## 2022-06-02 DIAGNOSIS — K50914 Crohn's disease, unspecified, with abscess: Secondary | ICD-10-CM

## 2022-06-02 DIAGNOSIS — E119 Type 2 diabetes mellitus without complications: Secondary | ICD-10-CM

## 2022-06-02 DIAGNOSIS — Z720 Tobacco use: Secondary | ICD-10-CM

## 2022-06-02 LAB — CBC
HCT: 28.3 % — ABNORMAL LOW (ref 36.0–46.0)
HCT: 26.9 % — ABNORMAL LOW (ref 36.0–46.0)
Hemoglobin: 9.3 g/dL — ABNORMAL LOW (ref 12.0–15.0)
Hemoglobin: 8.8 g/dL — ABNORMAL LOW (ref 12.0–15.0)
MCH: 27.9 pg (ref 26.0–34.0)
MCH: 28 pg (ref 26.0–34.0)
MCHC: 32.9 g/dL (ref 30.0–36.0)
MCHC: 32.7 g/dL (ref 30.0–36.0)
MCV: 85 fL (ref 80.0–100.0)
MCV: 85.7 fL (ref 80.0–100.0)
Platelets: 245 10*3/uL (ref 150–400)
Platelets: 205 10*3/uL (ref 150–400)
RBC: 3.33 MIL/uL — ABNORMAL LOW (ref 3.87–5.11)
RBC: 3.14 MIL/uL — ABNORMAL LOW (ref 3.87–5.11)
RDW: 15.6 % — ABNORMAL HIGH (ref 11.5–15.5)
RDW: 15.6 % — ABNORMAL HIGH (ref 11.5–15.5)
WBC: 13.2 10*3/uL — ABNORMAL HIGH (ref 4.0–10.5)
WBC: 11.2 10*3/uL — ABNORMAL HIGH (ref 4.0–10.5)
nRBC: 0 % (ref 0.0–0.2)
nRBC: 0 % (ref 0.0–0.2)

## 2022-06-02 LAB — BASIC METABOLIC PANEL
Anion gap: 8 (ref 5–15)
BUN: 8 mg/dL (ref 6–20)
CO2: 26 mmol/L (ref 22–32)
Calcium: 8 mg/dL — ABNORMAL LOW (ref 8.9–10.3)
Chloride: 100 mmol/L (ref 98–111)
Creatinine, Ser: 0.53 mg/dL (ref 0.44–1.00)
GFR, Estimated: >60 mL/min (ref >60)
Glucose, Bld: 128 mg/dL — ABNORMAL HIGH (ref 70–99)
Potassium: 3.1 mmol/L — ABNORMAL LOW (ref 3.5–5.1)
Sodium: 134 mmol/L — ABNORMAL LOW (ref 135–145)

## 2022-06-02 LAB — COMPREHENSIVE METABOLIC PANEL
ALT: 10 U/L (ref 0–44)
AST: 6 U/L — ABNORMAL LOW (ref 15–41)
Albumin: 1.9 g/dL — ABNORMAL LOW (ref 3.5–5.0)
Alkaline Phosphatase: 43 U/L (ref 38–126)
Anion gap: 4 — ABNORMAL LOW (ref 5–15)
BUN: 7 mg/dL (ref 6–20)
CO2: 25 mmol/L (ref 22–32)
Calcium: 7.8 mg/dL — ABNORMAL LOW (ref 8.9–10.3)
Chloride: 111 mmol/L (ref 98–111)
Creatinine, Ser: 0.51 mg/dL (ref 0.44–1.00)
GFR, Estimated: >60 mL/min (ref >60)
Glucose, Bld: 119 mg/dL — ABNORMAL HIGH (ref 70–99)
Potassium: 3.4 mmol/L — ABNORMAL LOW (ref 3.5–5.1)
Sodium: 140 mmol/L (ref 135–145)
Total Bilirubin: 0.3 mg/dL (ref 0.3–1.2)
Total Protein: 4.4 g/dL — ABNORMAL LOW (ref 6.5–8.1)

## 2022-06-02 LAB — GLUCOSE, CAPILLARY
Glucose-Capillary: 91 mg/dL (ref 70–99)
Glucose-Capillary: 100 mg/dL — ABNORMAL HIGH (ref 70–99)

## 2022-06-02 LAB — MAGNESIUM: Magnesium: 1.8 mg/dL (ref 1.7–2.4)

## 2022-06-02 LAB — PROTIME-INR
INR: 1.6 — ABNORMAL HIGH (ref 0.8–1.2)
Prothrombin Time: 18.4 seconds — ABNORMAL HIGH (ref 11.4–15.2)

## 2022-06-02 LAB — PHOSPHORUS: Phosphorus: 2.8 mg/dL (ref 2.5–4.6)

## 2022-06-02 LAB — APTT: aPTT: 37 seconds — ABNORMAL HIGH (ref 24–36)

## 2022-06-02 LAB — CBG MONITORING, ED: Glucose-Capillary: 122 mg/dL — ABNORMAL HIGH (ref 70–99)

## 2022-06-02 LAB — PREALBUMIN: Prealbumin: 11 mg/dL — ABNORMAL LOW (ref 18–38)

## 2022-06-02 LAB — LACTIC ACID, PLASMA: Lactic Acid, Venous: 1 mmol/L (ref 0.5–1.9)

## 2022-06-02 LAB — C-REACTIVE PROTEIN: CRP: 23.5 mg/dL — ABNORMAL HIGH (ref <1.0)

## 2022-06-02 MED ORDER — FLUMAZENIL 0.5 MG/5ML IV SOLN
INTRAVENOUS | Status: AC
  Filled 2022-06-02: qty 5

## 2022-06-02 MED ORDER — NICOTINE 14 MG/24HR TD PT24: 14.0000 mg | MEDICATED_PATCH | Freq: Every day | TRANSDERMAL | Status: DC

## 2022-06-02 MED ORDER — MIDAZOLAM HCL 2 MG/2ML IJ SOLN
INTRAMUSCULAR | Status: AC
  Filled 2022-06-02: qty 4

## 2022-06-02 MED ORDER — FENTANYL CITRATE (PF) 100 MCG/2ML IJ SOLN
INTRAMUSCULAR | Status: AC | PRN
  Administered 2022-06-02: 25 ug via INTRAVENOUS

## 2022-06-02 MED ORDER — SODIUM CHLORIDE 0.9 % IV SOLN
INTRAVENOUS | Status: AC
  Filled 2022-06-02: qty 500

## 2022-06-02 MED ORDER — ONDANSETRON HCL 4 MG/2ML IJ SOLN: 4.0000 mg | Freq: Four times a day (QID) | INTRAMUSCULAR | Status: DC | PRN

## 2022-06-02 MED ORDER — FENTANYL CITRATE (PF) 100 MCG/2ML IJ SOLN
INTRAMUSCULAR | Status: AC
  Filled 2022-06-02: qty 4

## 2022-06-02 MED ORDER — MIDAZOLAM HCL 2 MG/2ML IJ SOLN: INTRAMUSCULAR | Status: AC | PRN

## 2022-06-02 MED ORDER — TRAZODONE HCL 50 MG PO TABS
25.0000 mg | ORAL_TABLET | Freq: Every evening | ORAL | Status: DC | PRN
  Administered 2022-06-02 – 2022-06-03 (×2): 25 mg via ORAL
  Filled 2022-06-02 (×2): qty 1

## 2022-06-02 MED ORDER — LIDOCAINE-EPINEPHRINE 1 %-1:100000 IJ SOLN
INTRAMUSCULAR | Status: AC | PRN
  Administered 2022-06-02: 10 mL via INTRADERMAL

## 2022-06-02 MED ORDER — INSULIN GLARGINE-YFGN 100 UNIT/ML ~~LOC~~ SOLN: 7.0000 [IU] | Freq: Two times a day (BID) | SUBCUTANEOUS | Status: DC

## 2022-06-02 MED ORDER — SODIUM CHLORIDE 0.9% FLUSH
3.0000 mL | Freq: Two times a day (BID) | INTRAVENOUS | Status: DC
  Administered 2022-06-02 – 2022-06-08 (×11): 3 mL via INTRAVENOUS

## 2022-06-02 MED ORDER — ONDANSETRON HCL 4 MG/2ML IJ SOLN
INTRAMUSCULAR | Status: AC | PRN
  Administered 2022-06-02: 4 mg via INTRAVENOUS

## 2022-06-02 MED ORDER — SODIUM CHLORIDE 0.9 % IV SOLN: INTRAVENOUS | Status: DC

## 2022-06-02 MED ORDER — SODIUM CHLORIDE 0.9% FLUSH
5.0000 mL | Freq: Three times a day (TID) | INTRAVENOUS | Status: DC
  Administered 2022-06-02 – 2022-06-08 (×18): 5 mL

## 2022-06-02 MED ORDER — SENNOSIDES-DOCUSATE SODIUM 8.6-50 MG PO TABS
1.0000 | ORAL_TABLET | Freq: Every evening | ORAL | Status: DC | PRN
  Administered 2022-06-06: 1 via ORAL
  Filled 2022-06-02: qty 1

## 2022-06-02 MED ORDER — SODIUM CHLORIDE 0.9 % IV SOLN
INTRAVENOUS | Status: AC
  Filled 2022-06-02: qty 1000

## 2022-06-02 MED ORDER — POTASSIUM CHLORIDE 10 MEQ/100ML IV SOLN
10.0000 meq | INTRAVENOUS | Status: AC
  Administered 2022-06-02 (×4): 10 meq via INTRAVENOUS
  Filled 2022-06-02 (×4): qty 100

## 2022-06-02 MED ORDER — SODIUM CHLORIDE 0.9 % IV SOLN
INTRAVENOUS | Status: AC
  Filled 2022-06-02: qty 250

## 2022-06-02 MED ORDER — PANTOPRAZOLE SODIUM 40 MG PO TBEC: 40.0000 mg | DELAYED_RELEASE_TABLET | Freq: Every day | ORAL | Status: DC

## 2022-06-02 MED ORDER — INSULIN ASPART 100 UNIT/ML IJ SOLN
0.0000 [IU] | INTRAMUSCULAR | Status: DC
  Administered 2022-06-02 – 2022-06-06 (×8): 2 [IU] via SUBCUTANEOUS
  Administered 2022-06-07: 3 [IU] via SUBCUTANEOUS
  Administered 2022-06-07 – 2022-06-08 (×3): 2 [IU] via SUBCUTANEOUS
  Filled 2022-06-02: qty 0.15

## 2022-06-02 MED ORDER — MIDAZOLAM HCL 2 MG/2ML IJ SOLN
INTRAMUSCULAR | Status: AC | PRN
  Administered 2022-06-02: 1 mg via INTRAVENOUS

## 2022-06-02 MED ORDER — MIDAZOLAM HCL 2 MG/2ML IJ SOLN
INTRAMUSCULAR | Status: AC | PRN
  Administered 2022-06-02: .5 mg via INTRAVENOUS

## 2022-06-02 MED ORDER — POTASSIUM CHLORIDE 2 MEQ/ML IV SOLN
INTRAVENOUS | Status: DC
  Filled 2022-06-02 (×15): qty 1000

## 2022-06-02 MED ORDER — ACETAMINOPHEN 650 MG RE SUPP: 650.0000 mg | Freq: Four times a day (QID) | RECTAL | Status: DC | PRN

## 2022-06-02 MED ORDER — BISACODYL 5 MG PO TBEC
5.0000 mg | DELAYED_RELEASE_TABLET | Freq: Every day | ORAL | Status: DC | PRN
  Administered 2022-06-06: 5 mg via ORAL
  Filled 2022-06-02: qty 1

## 2022-06-02 MED ORDER — ONDANSETRON HCL 4 MG PO TABS: 4.0000 mg | ORAL_TABLET | Freq: Four times a day (QID) | ORAL | Status: DC | PRN

## 2022-06-02 MED ORDER — MORPHINE SULFATE (PF) 4 MG/ML IV SOLN: 4.0000 mg | INTRAVENOUS | Status: DC | PRN

## 2022-06-02 MED ORDER — SODIUM CHLORIDE 0.9 % IV SOLN: 2.0000 g | INTRAVENOUS | Status: DC

## 2022-06-02 MED ORDER — METRONIDAZOLE 500 MG/100ML IV SOLN: 500.0000 mg | Freq: Two times a day (BID) | INTRAVENOUS | Status: DC

## 2022-06-02 MED ORDER — HYDRALAZINE HCL 20 MG/ML IJ SOLN: 5.0000 mg | Freq: Three times a day (TID) | INTRAMUSCULAR | Status: DC | PRN

## 2022-06-02 MED ORDER — ONDANSETRON HCL 4 MG/2ML IJ SOLN
INTRAMUSCULAR | Status: AC
  Filled 2022-06-02: qty 2

## 2022-06-02 MED ORDER — NALOXONE HCL 0.4 MG/ML IJ SOLN
INTRAMUSCULAR | Status: AC
  Filled 2022-06-02: qty 1

## 2022-06-02 MED ORDER — OXYCODONE HCL 5 MG PO TABS: 10.0000 mg | ORAL_TABLET | ORAL | Status: DC | PRN

## 2022-06-02 MED ORDER — SODIUM CHLORIDE 0.9 % IV SOLN
INTRAVENOUS | Status: AC | PRN
  Administered 2022-06-02: 999 mL/h via INTRAVENOUS

## 2022-06-02 MED ORDER — ACETAMINOPHEN 325 MG PO TABS: 650.0000 mg | ORAL_TABLET | Freq: Four times a day (QID) | ORAL | Status: DC | PRN

## 2022-06-02 NOTE — Consult Note (Signed)
Consultation  Referring Provider:      Primary Care Physician:  Patient, No Pcp Per Primary Gastroenterologist:       Dr. Collene Mares  Reason for Consultation:     Crohn's disease, intra-abdominal abscess         HPI:   Sandra Foster is a 31 y.o. female with ileal Crohn's disease diagnosed in March 2023 when hospitalized for abdominal pain, started IV steroids, then later started on Humira.  She has been hospitalized 2 other times in June and July for abdominal pain and active Crohn's disease and was treated with IV steroids both time, transitioning back to PO steroids at discharge.  She reports that her symptoms have not significantly improved with Humira or steroids and that she has not been able to get off steroids since she was diagnosed earlier this year.  She says that she frequent abdominal pain and has lost 30lbs in the past few months.  Her bowel movements are typically formed, but recently she had some loose stools and the presence of blood in the stool, which is new.  She has poor appetite in general.  She presented to the ED yesterday because the pain had gotten more severe over the past 3 days and she was not able to eat anything.  She felt progressively weak.  She reports feeling 'hot' but denies fevers or chills.  She has vomited, most recently yesterday.    In the ED, at CT showed severe inflammatory changes in the ileum with a segment of ileal dilation and adjacent air-fluid collections of 2-3 cm in size, concerning for abscesses. General Surgery was consulted and recommended percutaneous drainage.  She underwent successful drainage of 2 intra-abdominal abscesses today.   She reports feeling slightly better since having the drains placed, but still reports significant discomfort.   Past Medical History:  Diagnosis Date   Chlamydia    Crohn's disease ([place])    Diabetes mellitus without complication ([place])    Fetal demise    hyperglycemic hyperosmolar nonketonic syndrome   Hernia     Pregnancy induced hypertension    Pyelonephritis     Past Surgical History:  Procedure Laterality Date   BIOPSY  01/09/2022   Procedure: BIOPSY;  Surgeon: Carol Ladajah, MD;  Location: [facility] ENDOSCOPY;  Service: Gastroenterology;;   COLONOSCOPY WITH PROPOFOL N/A 01/09/2022   Procedure: COLONOSCOPY WITH PROPOFOL;  Surgeon: Carol Willard, MD;  Location: [place] [place];  Service: Gastroenterology;  Laterality: N/A;   HERNIA REPAIR      Family History  Problem Relation Age of Onset   Hypertension Maternal Grandmother     Social History   Tobacco Use   Smoking status: Former    Packs/day: 0.25    Types: Cigarettes   Smokeless tobacco: Former    Quit date: 06/10/2011  Vaping Use   Vaping Use: Some days   Substances: Nicotine  Substance Use Topics   Alcohol use: No   Drug use: No    Prior to Admission medications   Medication Sig Start Date End Date Taking? Authorizing Provider  acetaminophen (TYLENOL) 500 MG tablet Take 1 tablet (500 mg total) by mouth every 6 (six) hours as needed. 05/22/22  Yes Nanavati, Ankit, MD  Adalimumab (HUMIRA PEN) 40 MG/0.4ML PNKT Inject 40 mg into the skin every 14 (fourteen) days. Every other Tuesday   Yes [provider]  carboxymethylcellulose (REFRESH PLUS) 0.5 % SOLN Place 1 drop into both eyes daily as needed (dry eyes).   Yes [provider]  etonogestrel (NEXPLANON) 68 MG IMPL implant 1 each by Subdermal route once.   Yes [provider]  insulin glargine (LANTUS) 100 UNIT/ML Solostar Pen Inject 15 Units into the skin 2 (two) times daily. Patient taking differently: Inject 9 Units into the skin daily. 04/26/22  Yes Mercy Riding, MD  morphine (MSIR) 30 MG tablet Take 1 tablet (30 mg total) by mouth every 12 (twelve) hours as needed for up to 9 doses for severe pain. 05/22/22  Yes Varney Biles, MD  blood glucose meter kit and supplies Dispense based on patient and insurance preference. Use up to four times daily as directed.  (FOR ICD-10 E10.9, E11.9). 04/26/22   Mercy Riding, MD  insulin aspart (NOVOLOG) 100 UNIT/ML FlexPen CBG < 70: Implement Hypoglycemia Standing Orders and refer to Hypoglycemia Standing Orders sidebar report CBG 70 - 120: 0 units CBG 121 - 150: 2 units CBG 151 - 200: 3 units CBG 201 - 250: 5 units CBG 251 - 300: 8 units CBG 301 - 350: 11 units CBG 351 - 400: 15 units CBG > 400: call MD and obtain STAT lab verification Patient not taking: Reported on 06/01/2022 04/26/22   Mercy Riding, MD  Insulin Pen Needle (PEN NEEDLES) 30G X 5 MM MISC 1 Pen by Does not apply route 4 (four) times daily -  before meals and at bedtime. 04/26/22   Mercy Riding, MD  nicotine (NICODERM CQ - DOSED IN MG/24 HOURS) 14 mg/24hr patch Place 1 patch (14 mg total) onto the skin daily. Patient not taking: Reported on 06/01/2022 04/26/22   Mercy Riding, MD  oxyCODONE (OXY IR/ROXICODONE) 5 MG immediate release tablet Take 1-2 tablets (5-10 mg total) by mouth every 4 (four) hours as needed for severe pain. Patient not taking: Reported on 06/01/2022 05/04/22   Cherene Altes, MD  pantoprazole (PROTONIX) 40 MG tablet Take 1 tablet (40 mg total) by mouth daily. Patient not taking: Reported on 06/01/2022 04/26/22 05/26/22  Mercy Riding, MD  predniSONE (DELTASONE) 10 MG tablet Take 5 tablets (50 mg total) by mouth daily. Patient not taking: Reported on 06/01/2022 05/22/22   Varney Biles, MD  amitriptyline (ELAVIL) 25 MG tablet Take 1-2 tablets (25-50 mg total) by mouth at bedtime as needed (headache). 03/25/20 06/18/20  Gregor Hams, MD    Current Facility-Administered Medications  Medication Dose Route Frequency Provider Last Rate Last Admin   acetaminophen (TYLENOL) tablet 650 mg  650 mg Oral Q6H PRN Hugelmeyer, Alexis, DO       Or   acetaminophen (TYLENOL) suppository 650 mg  650 mg Rectal Q6H PRN Hugelmeyer, Alexis, DO       alum & mag hydroxide-simeth (MAALOX/MYLANTA) 200-200-20 MG/5ML suspension 30 mL  30 mL Oral Q6H PRN Michael Boston,  MD       bisacodyl (DULCOLAX) EC tablet 5 mg  5 mg Oral Daily PRN Hugelmeyer, Alexis, DO       diphenhydrAMINE (BENADRYL) injection 12.5-25 mg  12.5-25 mg Intravenous Q6H PRN Michael Boston, MD   12.5 mg at 06/02/22 0022   hydrALAZINE (APRESOLINE) injection 5 mg  5 mg Intravenous Q8H PRN Hugelmeyer, Alexis, DO       HYDROmorphone (DILAUDID) injection 0.5-2 mg  0.5-2 mg Intravenous Q2H PRN Michael Boston, MD   1 mg at 06/02/22 1337   insulin aspart (novoLOG) injection 0-15 Units  0-15 Units Subcutaneous Q4H Hugelmeyer, Alexis, DO   2 Units at 06/02/22 0811   lactated ringers  1,000 mL with potassium chloride 20 mEq infusion   Intravenous Continuous Cyndia Skeeters, Taye T, MD       lactated ringers bolus 1,000 mL  1,000 mL Intravenous Q8H PRN Michael Boston, MD 999 mL/hr at 06/02/22 1241 Restarted at 06/02/22 1241   lip balm (CARMEX) ointment   Topical BID Michael Boston, MD   75 Application at 59/56/38 1040   magic mouthwash  15 mL Oral QID PRN Michael Boston, MD       menthol-cetylpyridinium (CEPACOL) lozenge 3 mg  1 lozenge Oral PRN Michael Boston, MD       methocarbamol (ROBAXIN) 1,000 mg in dextrose 5 % 100 mL IVPB  1,000 mg Intravenous Q6H PRN Michael Boston, MD 220 mL/hr at 06/02/22 1514 1,000 mg at 06/02/22 1514   metoprolol tartrate (LOPRESSOR) injection 5 mg  5 mg Intravenous Q6H PRN Michael Boston, MD       ondansetron [facility] Iv) 4 MG/2ML injection            ondansetron (ZOFRAN) injection 4 mg  4 mg Intravenous Q6H PRN Michael Boston, MD       Or   ondansetron (ZOFRAN) 8 mg in sodium chloride 0.9 % 50 mL IVPB  8 mg Intravenous Q6H PRN Michael Boston, MD       ondansetron [facility]) tablet 4 mg  4 mg Oral Q6H PRN Hugelmeyer, Alexis, DO       oxyCODONE (Oxy IR/ROXICODONE) immediate release tablet 5-10 mg  5-10 mg Oral Q4H PRN Michael Boston, MD   10 mg at 06/02/22 0740   pantoprazole (PROTONIX) EC tablet 40 mg  40 mg Oral BID Rogelia Mire, MD   40 mg at 06/02/22 0733   phenol (CHLORASEPTIC) mouth spray 2  spray  2 spray Mouth/Throat PRN Michael Boston, MD       piperacillin-tazobactam (ZOSYN) IVPB 3.375 g  3.375 g Intravenous Tor Netters, MD 12.5 mL/hr at 06/02/22 1236 3.375 g at 06/02/22 1236   prochlorperazine (COMPAZINE) injection 5-10 mg  5-10 mg Intravenous Q4H PRN Michael Boston, MD       senna-docusate (Senokot-S) tablet 1 tablet  1 tablet Oral QHS PRN Hugelmeyer, Alexis, DO       sodium chloride 0.9 % infusion            sodium chloride 0.9 % infusion            sodium chloride 0.9 % infusion            sodium chloride flush (NS) 0.9 % injection 3 mL  3 mL Intravenous Q12H Hugelmeyer, Alexis, DO   3 mL at 06/02/22 1040   sodium chloride flush (NS) 0.9 % injection 5 mL  5 mL Intracatheter Q8H Suttle, Rosanne Ashing, MD       traZODone (DESYREL) tablet 25 mg  25 mg Oral QHS PRN Hugelmeyer, Alexis, DO        Allergies as of 06/01/2022 - Review Complete 06/01/2022  Allergen Reaction Noted   Nsaids Other (See Comments) 06/01/2022     Review of Systems:    As per HPI, otherwise negative    Physical Exam:  Vital signs in last 24 hours: Temp:  [98.1 F (36.7 C)-99.9 F (37.7 C)] 98.6 F (37 C) (08/05 1259) Pulse Rate:  [70-106] 89 (08/05 1259) Resp:  [0-25] 20 (08/05 1259) BP: (90-117)/(47-70) 100/60 (08/05 1259) SpO2:  [91 %-100 %] 99 % (08/05 1259) Weight:  [61 kg] 61 kg (08/04 2200)   General:   Pleasant African  American female lying in bed, NAD Head:  Normocephalic and atraumatic. Eyes:   No icterus.   Conjunctiva pink. Ears:  Normal auditory acuity. Neck:  Supple Lungs:  Respirations even and unlabored. Lungs clear to auscultation bilaterally.   No wheezes, crackles, or rhonchi.  Heart:  Regular rate and rhythm; no MRG Abdomen:  Soft, nondistended, tender to light palpation Normal bowel sounds. No appreciable masses or hepatomegaly.  2 JP drains exiting lower abdomen with scant serosanguinous fluid Rectal:  Not performed.  Msk:  Symmetrical without gross deformities.   Extremities:  Without edema. Neurologic:  Alert and  oriented x4;  grossly normal neurologically. Skin:  Intact without significant lesions or rashes. Psych:  Alert and cooperative. Normal affect.  LAB RESULTS: Recent Labs    06/01/22 1529 06/02/22 0645 06/02/22 0754  WBC 25.2* 13.2* 11.2*  HGB 12.7 9.3* 8.8*  HCT 38.9 28.3* 26.9*  PLT 348 245 205   BMET Recent Labs    06/01/22 1529 06/02/22 0544 06/02/22 0754  NA 132* 134* 140  K 3.5 3.1* 3.4*  CL 96* 100 111  CO2 $Re'25 26 25  'ngZ$ GLUCOSE 185* 128* 119*  BUN $Re'9 8 7  'ZxN$ CREATININE 0.55 0.53 0.51  CALCIUM 8.9 8.0* 7.8*   LFT Recent Labs    06/02/22 0754  PROT 4.4*  ALBUMIN 1.9*  AST 6*  ALT 10  ALKPHOS 43  BILITOT 0.3   PT/INR Recent Labs    06/02/22 0754  LABPROT 18.4*  INR 1.6*    STUDIES: CT ABDOMEN PELVIS W CONTRAST  Result Date: 06/01/2022 CLINICAL DATA:  Right lower quadrant pain for 3 days, history of Crohn's disease EXAM: CT ABDOMEN AND PELVIS WITH CONTRAST TECHNIQUE: Multidetector CT imaging of the abdomen and pelvis was performed using the standard protocol following bolus administration of intravenous contrast. RADIATION DOSE REDUCTION: This exam was performed according to the departmental dose-optimization program which includes automated exposure control, adjustment of the mA and/or kV according to patient size and/or use of iterative reconstruction technique. CONTRAST:  162mL OMNIPAQUE IOHEXOL 300 MG/ML  SOLN COMPARISON:  05/03/2022 FINDINGS: Lower chest: No acute abnormality. Hepatobiliary: No focal liver abnormality is seen. No gallstones, gallbladder wall thickening, or biliary dilatation. Pancreas: Unremarkable. No pancreatic ductal dilatation or surrounding inflammatory changes. Spleen: Normal in size without focal abnormality. Adrenals/Urinary Tract: Adrenal glands are within normal limits. Kidneys demonstrate a normal enhancement pattern bilaterally. The bladder is incompletely distended. There is  however significant distension of the collecting systems and ureters bilaterally likely related to local compression by inflammatory changes in the pelvis. These changes are new from the prior exam. No calculi are identified. Stomach/Bowel: Mild fecal material is noted within the colon. The cecum and distal terminal ileum demonstrates significant inflammatory change much worse than that noted on the prior exam consistent with the known history of Crohn's. The appendix is within normal limits. The distal most aspect of the small bowel is mildly dilated related to the inflammatory changes. A few small air-fluid collections are identified 1 seen on image number 55 of series 2 measuring 2.2 cm and a second measuring 2.3 cm on image number 62 of series 2. These are highly suspicious for focal abscesses and new from the prior exam. An additional area measuring up to 3 cm best seen on image number 64 of series 2 and image number 43 of series 5 is also suspicious for abscess. More proximal small bowel is within normal limits. The stomach is unremarkable. Vascular/Lymphatic: No significant vascular findings  are present. No enlarged abdominal or pelvic lymph nodes. Reproductive: Uterus and bilateral adnexa are unremarkable. Other: No hernia is noted.  Free fluid is noted within the pelvis. Musculoskeletal: No acute or significant osseous findings. IMPRESSION: Significant progression of inflammatory change in the right lower quadrant when compared with the prior exam. Multiple small enhancing air-fluid collections are identified suspicious for abscesses and likely related to prior perforation. Free pelvic fluid is noted as well. These changes are consistent with the given clinical history of Crohn's and likely represent an acute flare with perforation and abscess formation. Bilateral hydronephrosis and hydroureter worse on the left than the right likely related to local compression related to the inflammatory changes.  Electronically Signed   By: Inez Catalina M.D.   On: 06/01/2022 20:19     PREVIOUS ENDOSCOPIES:             Colonoscopy January 09, 2022:  ICV was inflamed and friable.  Stenotic TI, unable to intubate, otherwise normal colon.   Impression / Plan:   31 year old female with initial presentation of stenotic ileal Crohn's disease, with progression of disease on steroids and Humira, presenting with intra-abdominal abscess, now s/p percutaneous drainage. Given the progression of disease on Humira and steroids, patient should be considered for alternative therapies, but this should be a decision made between her and her primary gastroenterologist as an outpatient. In the setting of an active abscess, would not initiate any immune suppressing agents, and would hold steroids for now.  For the immediate time being, recommend antibiotics and pain control. Ok starting clear liquid diet tomorrow from GI standpoint, but defer to IR. Patient high risk for VTE and will need chemoprophylaxis once safe from post-procedural standpoint. Check adalimumab levels and ab titers Check CRP today and again in 48 hrs.   Penetrating ileal Crohn's disease with intra-abdominal abscess - Continue Zosyn - Hold steroids for now; will resume once infection no longer concern - CLD diet tomorrow ok from GI standpoint - Check adalimumab levels/antibodies - CRP today and in 48 hrs - Start lovenox for DVT prophylaxis when safe from post-procedure standpoint   Thanks   LOS: 1 day   Daryel November  06/02/2022, 4:17 PM

## 2022-06-02 NOTE — Consult Note (Signed)
Chief Complaint: Patient was seen in consultation today for CT-guided aspiration of possible drainage of abdominal abscesses Chief Complaint  Patient presents with   Abdominal Pain    Referring Physician(s): Gross,S  Supervising Physician: Marliss Coots  Patient Status: [facility] - ED  History of Present Illness: Sandra Foster is a 31 y.o. female with past medical history of diabetes, GERD, iron deficiency anemia, pregnancy-induced hypertension, prior pyelonephritis, prior imaging findings suggesting Crohn's disease who was admitted to [facility] on 8/4 with persistent abdominal pain, back pain, recent bloody bowel movement, intermittent nausea.  CT abdomen pelvis performed yesterday revealed: Significant progression of inflammatory change in the right lower quadrant when compared with the prior exam. Multiple small enhancing air-fluid collections are identified suspicious for abscesses and likely related to prior perforation. Free pelvic fluid is noted as well. These changes are consistent with the given clinical history of Crohn's and likely represent an acute flare with perforation and abscess formation.   Bilateral hydronephrosis and hydroureter worse on the left than the right likely related to local compression related to the inflammatory changes  She is currently afebrile, WBC 13.2, hemoglobin 9.3, platelets normal, creatinine normal, PT 18.4, INR 1.6.   Patient was seen by surgery and request now received for CT-guided aspiration/drainage of abdominal abscesses.   Past Medical History:  Diagnosis Date   Chlamydia    Crohn's disease (HCC)    Diabetes mellitus without complication (HCC)    Fetal demise    hyperglycemic hyperosmolar nonketonic syndrome   Hernia    Pregnancy induced hypertension    Pyelonephritis     Past Surgical History:  Procedure Laterality Date   BIOPSY  01/09/2022   Procedure: BIOPSY;  Surgeon: Jeani Hawking, MD;  Location: [facility]  ENDOSCOPY;  Service: Gastroenterology;;   COLONOSCOPY WITH PROPOFOL N/A 01/09/2022   Procedure: COLONOSCOPY WITH PROPOFOL;  Surgeon: Jeani Hawking, MD;  Location: [facility] ENDOSCOPY;  Service: Gastroenterology;  Laterality: N/A;   HERNIA REPAIR      Allergies: Nsaids  Medications: Prior to Admission medications   Medication Sig Start Date End Date Taking? Authorizing Provider  acetaminophen (TYLENOL) 500 MG tablet Take 1 tablet (500 mg total) by mouth every 6 (six) hours as needed. 05/22/22  Yes Nanavati, Ankit, MD  Adalimumab (HUMIRA PEN) 40 MG/0.4ML PNKT Inject 40 mg into the skin every 14 (fourteen) days. Every other Tuesday   Yes [provider]  carboxymethylcellulose (REFRESH PLUS) 0.5 % SOLN Place 1 drop into both eyes daily as needed (dry eyes).   Yes [provider]  etonogestrel (NEXPLANON) 68 MG IMPL implant 1 each by Subdermal route once.   Yes [provider]  insulin glargine (LANTUS) 100 UNIT/ML Solostar Pen Inject 15 Units into the skin 2 (two) times daily. Patient taking differently: Inject 9 Units into the skin daily. 04/26/22  Yes Almon Hercules, MD  morphine (MSIR) 30 MG tablet Take 1 tablet (30 mg total) by mouth every 12 (twelve) hours as needed for up to 9 doses for severe pain. 05/22/22  Yes Derwood Kaplan, MD  blood glucose meter kit and supplies Dispense based on patient and insurance preference. Use up to four times daily as directed. (FOR ICD-10 E10.9, E11.9). 04/26/22   Almon Hercules, MD  insulin aspart (NOVOLOG) 100 UNIT/ML FlexPen CBG < 70: Implement Hypoglycemia Standing Orders and refer to Hypoglycemia Standing Orders sidebar report CBG 70 - 120: 0 units CBG 121 - 150: 2 units CBG 151 - 200: 3 units  CBG 201 - 250: 5 units CBG 251 - 300: 8 units CBG 301 - 350: 11 units CBG 351 - 400: 15 units CBG > 400: call MD and obtain STAT lab verification Patient not taking: Reported on 06/01/2022 04/26/22   Mercy Riding, MD  Insulin Pen Needle (PEN NEEDLES)  30G X 5 MM MISC 1 Pen by Does not apply route 4 (four) times daily -  before meals and at bedtime. 04/26/22   Mercy Riding, MD  nicotine (NICODERM CQ - DOSED IN MG/24 HOURS) 14 mg/24hr patch Place 1 patch (14 mg total) onto the skin daily. Patient not taking: Reported on 06/01/2022 04/26/22   Mercy Riding, MD  oxyCODONE (OXY IR/ROXICODONE) 5 MG immediate release tablet Take 1-2 tablets (5-10 mg total) by mouth every 4 (four) hours as needed for severe pain. Patient not taking: Reported on 06/01/2022 05/04/22   Cherene Altes, MD  pantoprazole (PROTONIX) 40 MG tablet Take 1 tablet (40 mg total) by mouth daily. Patient not taking: Reported on 06/01/2022 04/26/22 05/26/22  Mercy Riding, MD  predniSONE (DELTASONE) 10 MG tablet Take 5 tablets (50 mg total) by mouth daily. Patient not taking: Reported on 06/01/2022 05/22/22   Varney Biles, MD  amitriptyline (ELAVIL) 25 MG tablet Take 1-2 tablets (25-50 mg total) by mouth at bedtime as needed (headache). 03/25/20 06/18/20  Gregor Hams, MD     Family History  Problem Relation Age of Onset   Hypertension Maternal Grandmother     Social History   Socioeconomic History   Marital status: Significant Other    Spouse name: Not on file   Number of children: Not on file   Years of education: Not on file   Highest education level: Not on file  Occupational History   Not on file  Tobacco Use   Smoking status: Former    Packs/day: 0.25    Types: Cigarettes   Smokeless tobacco: Former    Quit date: 06/10/2011  Vaping Use   Vaping Use: Some days   Substances: Nicotine  Substance and Sexual Activity   Alcohol use: No   Drug use: No   Sexual activity: Yes    Birth control/protection: None  Other Topics Concern   Not on file  Social History Narrative   Not on file   Social Determinants of Health   Financial Resource Strain: Not on file  Food Insecurity: Not on file  Transportation Needs: Not on file  Physical Activity: Not on file  Stress: Not  on file  Social Connections: Not on file      Review of Systems see above; denies fever, headache, chest pain, cough, vomiting.  She does have some splinting due to abdominal pain.  Vital Signs: BP 101/64   Pulse 88   Temp 98.6 F (37 C) (Oral)   Resp 20   Ht $R'5\' 6"'Vb$  (1.676 m)   Wt 134 lb 7.7 oz (61 kg)   SpO2 97%   BMI 21.71 kg/m     Physical Exam awake, alert.  Chest clear to auscultation bilaterally.  Heart with regular rate and rhythm.  Abdomen sl dist, soft, few bowel sounds, some mild- mod generalized tenderness to palpation, sl more RLQ.  No lower extremity edema.  Imaging: CT ABDOMEN PELVIS W CONTRAST  Result Date: 06/01/2022 CLINICAL DATA:  Right lower quadrant pain for 3 days, history of Crohn's disease EXAM: CT ABDOMEN AND PELVIS WITH CONTRAST TECHNIQUE: Multidetector CT imaging of the abdomen and pelvis  was performed using the standard protocol following bolus administration of intravenous contrast. RADIATION DOSE REDUCTION: This exam was performed according to the departmental dose-optimization program which includes automated exposure control, adjustment of the mA and/or kV according to patient size and/or use of iterative reconstruction technique. CONTRAST:  149mL OMNIPAQUE IOHEXOL 300 MG/ML  SOLN COMPARISON:  05/03/2022 FINDINGS: Lower chest: No acute abnormality. Hepatobiliary: No focal liver abnormality is seen. No gallstones, gallbladder wall thickening, or biliary dilatation. Pancreas: Unremarkable. No pancreatic ductal dilatation or surrounding inflammatory changes. Spleen: Normal in size without focal abnormality. Adrenals/Urinary Tract: Adrenal glands are within normal limits. Kidneys demonstrate a normal enhancement pattern bilaterally. The bladder is incompletely distended. There is however significant distension of the collecting systems and ureters bilaterally likely related to local compression by inflammatory changes in the pelvis. These changes are new from the  prior exam. No calculi are identified. Stomach/Bowel: Mild fecal material is noted within the colon. The cecum and distal terminal ileum demonstrates significant inflammatory change much worse than that noted on the prior exam consistent with the known history of Crohn's. The appendix is within normal limits. The distal most aspect of the small bowel is mildly dilated related to the inflammatory changes. A few small air-fluid collections are identified 1 seen on image number 55 of series 2 measuring 2.2 cm and a second measuring 2.3 cm on image number 62 of series 2. These are highly suspicious for focal abscesses and new from the prior exam. An additional area measuring up to 3 cm best seen on image number 64 of series 2 and image number 43 of series 5 is also suspicious for abscess. More proximal small bowel is within normal limits. The stomach is unremarkable. Vascular/Lymphatic: No significant vascular findings are present. No enlarged abdominal or pelvic lymph nodes. Reproductive: Uterus and bilateral adnexa are unremarkable. Other: No hernia is noted.  Free fluid is noted within the pelvis. Musculoskeletal: No acute or significant osseous findings. IMPRESSION: Significant progression of inflammatory change in the right lower quadrant when compared with the prior exam. Multiple small enhancing air-fluid collections are identified suspicious for abscesses and likely related to prior perforation. Free pelvic fluid is noted as well. These changes are consistent with the given clinical history of Crohn's and likely represent an acute flare with perforation and abscess formation. Bilateral hydronephrosis and hydroureter worse on the left than the right likely related to local compression related to the inflammatory changes. Electronically Signed   By: Inez Catalina M.D.   On: 06/01/2022 20:19   DG Chest Port 1 View  Result Date: 05/22/2022 CLINICAL DATA:  Two days of lower abdominal pain, history of Crohn's  disease EXAM: PORTABLE CHEST 1 VIEW COMPARISON:  Radiographs 06/02/2012 FINDINGS: The heart size and mediastinal contours are within normal limits. Both lungs are clear. The visualized skeletal structures are unremarkable. No free air under the diaphragms on semi-erect AP radiograph. IMPRESSION: No acute abnormality. Electronically Signed   By: Placido Sou M.D.   On: 05/22/2022 14:41   CT Abdomen Pelvis Wo Contrast  Result Date: 05/03/2022 CLINICAL DATA:  RLQ abdominal pain (Age >= 14y) EXAM: CT ABDOMEN AND PELVIS WITHOUT CONTRAST TECHNIQUE: Multidetector CT imaging of the abdomen and pelvis was performed following the standard protocol without IV contrast. RADIATION DOSE REDUCTION: This exam was performed according to the departmental dose-optimization program which includes automated exposure control, adjustment of the mA and/or kV according to patient size and/or use of iterative reconstruction technique. COMPARISON:  March 2023 FINDINGS:  Lower chest: No acute abnormality. Hepatobiliary: No focal liver abnormality is seen. No gallstones, gallbladder wall thickening, or biliary dilatation. Pancreas: Unremarkable. Spleen: Unremarkable. Adrenals/Urinary Tract: Adrenals, kidneys, and poorly distended bladder are unremarkable. Stomach/Bowel: Stomach is within normal limits. There are inflammatory changes in the right lower quadrant about the terminal ileum. Dilatation of distal ileal loops reflecting associated ileus or obstruction. Appendix appears normal. Vascular/Lymphatic: No significant vascular abnormality on this noncontrast study. Likely reactive mesenteric nodes. Reproductive: Uterus and bilateral adnexa are unremarkable. Other: No free air. Musculoskeletal: No acute osseous abnormality. IMPRESSION: Suboptimal evaluation on this study performed without intravenous or enteric contrast. Right lower quadrant inflammatory changes likely reflecting persistent or recurrent terminal ileitis. Dilatation of  distal ileal loops reflects associated ileus or obstruction. Electronically Signed   By: Macy Mis M.D.   On: 05/03/2022 12:15    Labs:  CBC: Recent Labs    05/22/22 1220 06/01/22 1529 06/02/22 0645 06/02/22 0754  WBC 10.2 25.2* 13.2* 11.2*  HGB 11.3* 12.7 9.3* 8.8*  HCT 34.6* 38.9 28.3* 26.9*  PLT 376 348 245 205    COAGS: Recent Labs    06/02/22 0754  INR 1.6*  APTT 37*    BMP: Recent Labs    05/22/22 1220 06/01/22 1529 06/02/22 0544 06/02/22 0754  NA 135 132* 134* 140  K 3.3* 3.5 3.1* 3.4*  CL 99 96* 100 111  CO2 $Re'27 25 26 25  'sYl$ GLUCOSE 188* 185* 128* 119*  BUN $Re'10 9 8 7  'OKX$ CALCIUM 8.4* 8.9 8.0* 7.8*  CREATININE 0.61 0.55 0.53 0.51  GFRNONAA >60 >60 >60 >60    LIVER FUNCTION TESTS: Recent Labs    05/03/22 1115 05/22/22 1220 06/01/22 1529 06/02/22 0754  BILITOT 0.4 0.3 0.8 0.3  AST 9* 12* 8* 6*  ALT $Re'16 9 16 10  'chN$ ALKPHOS 57 51 62 43  PROT 7.2 6.7 6.6 4.4*  ALBUMIN 3.7 3.1* 3.1* 1.9*    TUMOR MARKERS: No results for input(s): "AFPTM", "CEA", "CA199", "CHROMGRNA" in the last 8760 hours.  Assessment and Plan: Sandra Foster is a 31 y.o. female with past medical history of diabetes, GERD, iron deficiency anemia, pregnancy-induced hypertension, prior pyelonephritis, prior imaging findings suggesting Crohn's disease who was admitted to [facility] on 8/4 with persistent abdominal pain, back pain, recent bloody bowel movement, intermittent nausea.  CT abdomen pelvis performed yesterday revealed:  Significant progression of inflammatory change in the right lower quadrant when compared with the prior exam. Multiple small enhancing air-fluid collections are identified suspicious for abscesses and likely related to prior perforation. Free pelvic fluid is noted as well. These changes are consistent with the given clinical history of Crohn's and likely represent an acute flare with perforation and abscess formation.   Bilateral hydronephrosis and  hydroureter worse on the left than the right likely related to local compression related to the inflammatory changes  She is currently afebrile, WBC 13.2, hemoglobin 9.3, platelets normal, creatinine normal, PT 18.4, INR 1.6.   Patient was seen by surgery and request now received for CT-guided aspiration/drainage of abdominal abscesses.  Imaging studies have been reviewed by Dr. Serafina Royals.Risks and benefits discussed with the patient including bleeding, infection, damage to adjacent structures, bowel perforation/fistula connection, and sepsis.  All of the patient's questions were answered, patient is agreeable to proceed. Consent signed and in chart.  Procedure scheduled for today.    Thank you for this interesting consult.  I greatly enjoyed meeting Sandra Foster and look forward to participating  in their care.  A copy of this report was sent to the requesting provider on this date.  Electronically Signed: D. Rowe Robert, PA-C 06/02/2022, 8:51 AM   I spent a total of  25 minutes   in face to face in clinical consultation, greater than 50% of which was counseling/coordinating care for CT-guided aspiration/possible drainage of abdominal abscess

## 2022-06-02 NOTE — ED Notes (Signed)
Pt departs ED room to have drains placed

## 2022-06-02 NOTE — Procedures (Signed)
Interventional Radiology Procedure Note  Procedure: CT guided abdominal abscess drain placement x2  Findings: Please refer to procedural dictation for full description. RLQ anterior and RLQ lateral 10.2 Fr pigtail drains placed.  Sanguinopurulent output from each.  Both drains to bulb suction.  Complications: None immediate  Estimated Blood Loss: < 5 mL  Recommendations: Keep to bulb suction, flush with 5-10 mL saline BID. IR will follow.   Marliss Coots, MD Pager: [phone]

## 2022-06-02 NOTE — Progress Notes (Signed)
PROGRESS NOTE  Sandra Foster DOB: Mar 26, 1991   PCP: Patient, No Pcp Per  Patient is from: Home.  DOA: 06/01/2022 LOS: 1  Chief complaints Chief Complaint  Patient presents with   Abdominal Pain     Brief Narrative / Interim history: 31 year old F with PMH of DM-2, chron's disease on Humira and recurrent hospitalization due to Crohn's flareup for which she has been treated with a steroid returning with new onset RLQ pain radiating to right flank and back as well as groin for 2 days, and admitted for sepsis secondary to multiple intra-abdominal abscesses with perforation likely related to Crohn's flare and bilateral hydroureteronephrosis, left > right.  Patient received CTX and Flagyl in ED, then switched to IV Zosyn by general surgery.  The next day, she has CT-guided drain placement x2 by IR.   Subjective: Seen and examined earlier this morning.  No major events overnight of this morning.  Reports improvement in her pain after she took oxycodone about 2 to 3 hours ago.  She reports some urinary hesitancy but no dysuria.  Denies nausea or vomiting but n.p.o.  She denies chest pain or dyspnea.  She says she quit smoking cigarette.  Objective: Vitals:   06/02/22 1205 06/02/22 1208 06/02/22 1210 06/02/22 1215  BP: 114/60 (!) 115/59 110/64 (!) 111/56  Pulse: 92 92 93 93  Resp: (!) 23 (!) 25 10 (!) 0  Temp:      TempSrc:      SpO2: 100% 100% 100% 97%  Weight:      Height:        Examination:  GENERAL: No apparent distress.  Nontoxic. HEENT: MMM.  Vision and hearing grossly intact.  NECK: Supple.  No apparent JVD.  RESP:  No IWOB.  Fair aeration bilaterally. CVS:  RRR. Heart sounds normal.  ABD/GI/GU: BS+. Abd soft.  Significant tenderness across lower abdomen. MSK/EXT:  Moves extremities. No apparent deformity. No edema.  SKIN: no apparent skin lesion or wound NEURO: Awake, alert and oriented appropriately.  No apparent focal neuro deficit. PSYCH: Calm. Normal  affect.   Procedures:  80/5-CT-guided abdominal abscess drain placement x2  Microbiology summarized: Blood cultures NGTD Abscess culture pending.  Assessment and plan: Principal Problem:   Crohn's disease of ileum, with abscess (HCC) Active Problems:   Insulin-requiring or dependent type II diabetes mellitus (HCC)   Tobacco abuse   Uncontrolled NIDDM-2 with hyperglycemia   Iron deficiency anemia   Acute Crohn's disease (HCC)   Gastroesophageal reflux disease   Sepsis (HCC)   Sepsis secondary to multiple intra-abdominal abscesses/perforation likely related to Crohn's flare-presents with abdominal pain for 2 days.  CT abdomen and pelvis raises concern for acute Crohn's flare with perforation and multiple abscess formation.  She is very tender to palpation. -Appreciate help by general surgery -S/p drain placement for abscess x2 on 8/5. -Continue IV Zosyn -Pain control -Follow abscess culture -Followed by [facility].  [facility] to see over the weekend -Defer management of Crohn's to GI.   Bilateral hydroureteronephrosis, left> right.  No AKI.  She reports hesitancy but no other UTI symptoms.  Likely from intra-abdominal abscesses. -Discussed with urology, Dr. Annabell Howells who recommends observation given normal renal function -May need renal ultrasound prior to discharge to reassess  Uncontrolled IDDM-2 in the setting of chronic steroid use for Crohn's: A1c 11% on 6/26. Recent Labs  Lab 06/02/22 0803  GLUCAP 122*  -Continue SSI-moderate every 4 hours. -Discontinue basal insulin while n.p.o.  Normocytic anemia: Partly  dilutional from IV fluid hydration. Recent Labs    04/22/22 1133 04/23/22 0437 04/24/22 0423 04/25/22 0423 04/26/22 0421 05/03/22 1115 05/22/22 1220 06/01/22 1529 06/02/22 0645 06/02/22 0754  HGB 11.5* 10.1* 9.8* 9.9* 10.3* 13.4 11.3* 12.7 9.3* 8.8*  -Monitor  Prior tobacco use: Reports quitting smoking.  Congratulated   Body mass index is 21.71  kg/m.           DVT prophylaxis:  SCDs Start: 06/02/22 0754  Code Status: Full code Family Communication: None at bedside Level of care: Med-Surg Status is: Inpatient Remains inpatient appropriate because: Sepsis due to intra-abdominal abscesses and perforation in the setting of Crohn's flare   Final disposition: Home once medically stable Consultants:  General surgery Gastroenterology Interventional radiology  Sch Meds:  Scheduled Meds:  flumazenil       insulin aspart  0-15 Units Subcutaneous Q4H   lip balm   Topical BID   naloxone       ondansetron       pantoprazole  40 mg Oral BID AC   sodium chloride flush  3 mL Intravenous Q12H   Continuous Infusions:  sodium chloride     lactated ringers Stopped (06/02/22 0856)   methocarbamol (ROBAXIN) IV     ondansetron (ZOFRAN) IV     piperacillin-tazobactam (ZOSYN)  IV Stopped (06/02/22 0609)   sodium chloride     sodium chloride     sodium chloride     sodium chloride     PRN Meds:.acetaminophen **OR** acetaminophen, alum & mag hydroxide-simeth, bisacodyl, diphenhydrAMINE, flumazenil, hydrALAZINE, HYDROmorphone (DILAUDID) injection, lactated ringers, magic mouthwash, menthol-cetylpyridinium, methocarbamol (ROBAXIN) IV, metoprolol tartrate, naloxone, ondansetron, ondansetron (ZOFRAN) IV **OR** ondansetron (ZOFRAN) IV, ondansetron **OR** [DISCONTINUED] ondansetron (ZOFRAN) IV, oxyCODONE, phenol, prochlorperazine, senna-docusate, sodium chloride, sodium chloride, sodium chloride, sodium chloride, traZODone  Antimicrobials: Anti-infectives (From admission, onward)    Start     Dose/Rate Route Frequency Ordered Stop   06/02/22 2200  cefTRIAXone (ROCEPHIN) 2 g in sodium chloride 0.9 % 100 mL IVPB  Status:  Discontinued        2 g 200 mL/hr over 30 Minutes Intravenous Every 24 hours 06/02/22 0753 06/02/22 0820   06/02/22 0753  metroNIDAZOLE (FLAGYL) IVPB 500 mg  Status:  Discontinued        500 mg 100 mL/hr over 60  Minutes Intravenous Every 12 hours 06/02/22 0753 06/02/22 0820   06/02/22 0300  piperacillin-tazobactam (ZOSYN) IVPB 3.375 g        3.375 g 12.5 mL/hr over 240 Minutes Intravenous Every 8 hours 06/01/22 2209 06/07/22 0259   06/01/22 1900  cefTRIAXone (ROCEPHIN) 2 g in sodium chloride 0.9 % 100 mL IVPB        2 g 200 mL/hr over 30 Minutes Intravenous  Once 06/01/22 1855 06/01/22 1950   06/01/22 1900  metroNIDAZOLE (FLAGYL) IVPB 500 mg        500 mg 100 mL/hr over 60 Minutes Intravenous  Once 06/01/22 1855 06/01/22 2033        I have personally reviewed the following labs and images: CBC: Recent Labs  Lab 06/01/22 1529 06/02/22 0645 06/02/22 0754  WBC 25.2* 13.2* 11.2*  HGB 12.7 9.3* 8.8*  HCT 38.9 28.3* 26.9*  MCV 84.4 85.0 85.7  PLT 348 245 205   BMP &GFR Recent Labs  Lab 06/01/22 1529 06/02/22 0544 06/02/22 0754  NA 132* 134* 140  K 3.5 3.1* 3.4*  CL 96* 100 111  CO2 25 26 25   GLUCOSE 185* 128* 119*  BUN 9 8 7   CREATININE 0.55 0.53 0.51  CALCIUM 8.9 8.0* 7.8*  MG  --   --  1.8  PHOS  --   --  2.8   Estimated Creatinine Clearance: 96.3 mL/min (by C-G formula based on SCr of 0.51 mg/dL). Liver & Pancreas: Recent Labs  Lab 06/01/22 1529 06/02/22 0754  AST 8* 6*  ALT 16 10  ALKPHOS 62 43  BILITOT 0.8 0.3  PROT 6.6 4.4*  ALBUMIN 3.1* 1.9*   Recent Labs  Lab 06/01/22 1529  LIPASE 26   No results for input(s): "AMMONIA" in the last 168 hours. Diabetic: No results for input(s): "HGBA1C" in the last 72 hours. Recent Labs  Lab 06/02/22 0803  GLUCAP 122*   Cardiac Enzymes: No results for input(s): "CKTOTAL", "CKMB", "CKMBINDEX", "TROPONINI" in the last 168 hours. No results for input(s): "PROBNP" in the last 8760 hours. Coagulation Profile: Recent Labs  Lab 06/02/22 0754  INR 1.6*   Thyroid Function Tests: No results for input(s): "TSH", "T4TOTAL", "FREET4", "T3FREE", "THYROIDAB" in the last 72 hours. Lipid Profile: No results for input(s):  "CHOL", "HDL", "LDLCALC", "TRIG", "CHOLHDL", "LDLDIRECT" in the last 72 hours. Anemia Panel: No results for input(s): "VITAMINB12", "FOLATE", "FERRITIN", "TIBC", "IRON", "RETICCTPCT" in the last 72 hours. Urine analysis:    Component Value Date/Time   COLORURINE YELLOW 06/01/2022 1905   APPEARANCEUR HAZY (A) 06/01/2022 1905   LABSPEC 1.011 06/01/2022 1905   PHURINE 6.0 06/01/2022 1905   GLUCOSEU NEGATIVE 06/01/2022 1905   HGBUR MODERATE (A) 06/01/2022 1905   BILIRUBINUR NEGATIVE 06/01/2022 1905   KETONESUR 20 (A) 06/01/2022 1905   PROTEINUR NEGATIVE 06/01/2022 1905   UROBILINOGEN 0.2 08/11/2015 2134   NITRITE NEGATIVE 06/01/2022 1905   LEUKOCYTESUR NEGATIVE 06/01/2022 1905   Sepsis Labs: Invalid input(s): "PROCALCITONIN", "LACTICIDVEN"  Microbiology: Recent Results (from the past 240 hour(s))  Culture, blood (single)     Status: None (Preliminary result)   Collection Time: 06/01/22  7:26 PM   Specimen: BLOOD  Result Value Ref Range Status   Specimen Description   Final    BLOOD SITE NOT SPECIFIED Performed at [facility], [address], [place], [place]    Special Requests   Final    BOTTLES DRAWN AEROBIC AND ANAEROBIC Blood Culture adequate volume Performed at [facility], [address], [place], [place]    Culture   Final    NO GROWTH < 12 HOURS Performed at [facility], [address], [place], [place]    Report Status PENDING  Incomplete    Radiology Studies: CT ABDOMEN PELVIS W CONTRAST  Result Date: 06/01/2022 CLINICAL DATA:  Right lower quadrant pain for 3 days, history of Crohn's disease EXAM: CT ABDOMEN AND PELVIS WITH CONTRAST TECHNIQUE: Multidetector CT imaging of the abdomen and pelvis was performed using the standard protocol following bolus administration of intravenous contrast. RADIATION DOSE REDUCTION: This exam was performed according to the departmental dose-optimization  program which includes automated exposure control, adjustment of the mA and/or kV according to patient size and/or use of iterative reconstruction technique. CONTRAST:  08/01/2022 OMNIPAQUE IOHEXOL 300 MG/ML  SOLN COMPARISON:  05/03/2022 FINDINGS: Lower chest: No acute abnormality. Hepatobiliary: No focal liver abnormality is seen. No gallstones, gallbladder wall thickening, or biliary dilatation. Pancreas: Unremarkable. No pancreatic ductal dilatation or surrounding inflammatory changes. Spleen: Normal in size without focal abnormality. Adrenals/Urinary Tract: Adrenal glands are within normal limits. Kidneys demonstrate a normal enhancement pattern bilaterally.  The bladder is incompletely distended. There is however significant distension of the collecting systems and ureters bilaterally likely related to local compression by inflammatory changes in the pelvis. These changes are new from the prior exam. No calculi are identified. Stomach/Bowel: Mild fecal material is noted within the colon. The cecum and distal terminal ileum demonstrates significant inflammatory change much worse than that noted on the prior exam consistent with the known history of Crohn's. The appendix is within normal limits. The distal most aspect of the small bowel is mildly dilated related to the inflammatory changes. A few small air-fluid collections are identified 1 seen on image number 55 of series 2 measuring 2.2 cm and a second measuring 2.3 cm on image number 62 of series 2. These are highly suspicious for focal abscesses and new from the prior exam. An additional area measuring up to 3 cm best seen on image number 64 of series 2 and image number 43 of series 5 is also suspicious for abscess. More proximal small bowel is within normal limits. The stomach is unremarkable. Vascular/Lymphatic: No significant vascular findings are present. No enlarged abdominal or pelvic lymph nodes. Reproductive: Uterus and bilateral adnexa are unremarkable.  Other: No hernia is noted.  Free fluid is noted within the pelvis. Musculoskeletal: No acute or significant osseous findings. IMPRESSION: Significant progression of inflammatory change in the right lower quadrant when compared with the prior exam. Multiple small enhancing air-fluid collections are identified suspicious for abscesses and likely related to prior perforation. Free pelvic fluid is noted as well. These changes are consistent with the given clinical history of Crohn's and likely represent an acute flare with perforation and abscess formation. Bilateral hydronephrosis and hydroureter worse on the left than the right likely related to local compression related to the inflammatory changes. Electronically Signed   By: Alcide Clever M.D.   On: 06/01/2022 20:19      Lorilee Cafarella T. Darneshia Demary [facility]  If 7PM-7AM, please contact night-coverage [email or web address] 06/02/2022, 12:30 PM

## 2022-06-02 NOTE — Progress Notes (Signed)
Upon arrive to unit from getting drains placed, pt informed the nurse that she made herself bed bound for the 2 days due to her stomach pain.  Pt refused to get up from stretcher after drain placement and requested a purewick.

## 2022-06-02 NOTE — Consult Note (Signed)
Sandra Foster  [birth year] [id number]  CARE TEAM:  PCP: Patient, No Pcp Per  Outpatient Care Team: Patient Care Team: Patient, No Pcp Per as PCP - General (General Practice) Juanita Craver, MD as Consulting Physician (Gastroenterology) Frederico Hamman, MD as Referring Physician (Obstetrics and Gynecology) Jorge Ny, PA-C (Physician Assistant)  Inpatient Treatment Team: Treatment Team: Attending Provider: Mercy Riding, MD; Consulting Physician: Nolon Nations, MD; Consulting Physician: Carol Zania, MD; Technician: Lanetta Inch, NT; Rounding Team: Bethann Berkshire, MD; Utilization Review: Claudie Leach, RN; Technician: Tyron Russell, [place]; Registered Nurse: Doran Stabler, RN; Paramedic: Marzetta Merino, EMT-P   This patient is a 31 y.o.female who presents today for surgical evaluation at the request of Dr Pearline Cables.   Chief complaint / Reason for evaluation: Crohn's disease  Patient with multiple medical problems including insulin requiring diabetes, GERD, iron deficiency anemia.  Has had worsening episodes of crampy abdominal pain in March was diagnosed with ileitis & workup highly suspicious for Crohn's disease.  Followed by Dr. Juanita Craver with [facility].  Was placed on steroid taper and started on Humira.  Readmitted in June and in July with issues.  Steroid redosing with some improvement.  Staying on Humira.  Weaned off second steroid taper.  Noted started having worsening cramping a couple of days ago.  Intensified.  Concern.  Came emergency department.  CT scan notes significant inflammation with probable abscesses as well.  Based on the abnormal CAT scan, emergency department called surgery first.  Patient [place].  No beds available yet.  Received IV antibiotics and pain meds.  Still with significant discomfort.  She notes she had a bloody bowel movement the other day.  Passing some flatus but not high-volume.  Somewhat nauseated but not throwing  up.    Still having pain.  Somewhat better controlled with medicines.  I believe she is holding off on smoking but does have a moderate smoking history as well.  She has never had any abdominal surgery.  He is not on blood thinners.  Not a diabetic.   Assessment  Sandra Foster  31 y.o. female       Problem List:  Principal Problem:   Crohn's disease of ileum, with abscess ([place]) Active Problems:   Insulin-requiring or dependent type II diabetes mellitus ([place])   Tobacco abuse   Uncontrolled NIDDM-2 with hyperglycemia   Iron deficiency anemia   Acute Crohn's disease ([place])   Gastroesophageal reflux disease   Sepsis ([place])   Persistent Crohn's disease with evidence of inflammation and probable fistulization and probable abscesses  Plan:  Medical admission.  Gastroenterology and surgical consultations.  IV antibiotics.  I had ordered Zosyn last night.  Defer to GI if they wish to switch that but would start with that.  See if interventional allergy can possibly drain the free fluid and maybe 1 or 2 the abscesses to get this under control.  If looks serous, try and see if we can turn things around with him or suppression antibiotics.  If bilious/feculent or severely purulent may require more aggressive operative intervention this admission.    If she does not improve with redosing of immunosuppression, IV emetics, drainage; then, most likely require operative exploration which most likely will involve ileocecal resection and end ileostomy to get this under control.  She would like to avoid that is possible but is also feeling frustrated with recurrent admissions.  She does have significant pain and some focal peritonitis but  she is not in shock.  Her leukocytosis is improved and her tachycardia has improved.  Pain is under better control.  Guardedly hopeful signs.  It would be nice to see if we can get this under better control to avoid large bowel resection.  Surgery will follow  closely.  Consider TPN nutrition since suspect her p.o. intake has been fair and her prealbumin is low.  I doubt things will turn around quickly.  Hypokalemia.  Correct.  Check magnesium.  -VTE prophylaxis- SCDs, etc  -mobilize as tolerated to help recovery  I reviewed nursing notes, ED provider notes, hospitalist notes, last 24 h vitals and pain scores, last 48 h intake and output, last 24 h labs and trends, and last 24 h imaging results. I have reviewed this patient's available data, including medical history, events of note, test results, etc as part of my evaluation.  A significant portion of that time was spent in counseling.  Care during the described time interval was provided by me.  This care required moderate level of medical decision making.  06/02/2022  Adin Hector, MD, FACS, MASCRS Esophageal, Gastrointestinal & Colorectal Surgery Robotic and Minimally Invasive Surgery  [facility] [address] [place], [place] [address] [phone] Fax [phone] Main  CONTACT INFORMATION:  Weekday (9AM-5PM): Call [facility] main office at [phone]  Weeknight (5PM-9AM) or Weekend/Holiday: Check [email or web address] (password " [email or web address]") for General Surgery [facility] coverage  (Please, do not use SecureChat as it is not reliable communication to reach operating surgeons for immediate patient care)      06/02/2022      Past Medical History:  Diagnosis Date   Chlamydia    Crohn's disease ([place])    Diabetes mellitus without complication ([employer] [employer] [facility])    Fetal demise    hyperglycemic hyperosmolar nonketonic syndrome   Hernia    Pregnancy induced hypertension    Pyelonephritis     Past Surgical History:  Procedure Laterality Date   BIOPSY  01/09/2022   Procedure: BIOPSY;  Surgeon: Carol Nivia, MD;  Location: [facility];  Service: Gastroenterology;;   COLONOSCOPY WITH PROPOFOL N/A 01/09/2022   Procedure: COLONOSCOPY WITH  PROPOFOL;  Surgeon: Carol Salvador, MD;  Location: [facility];  Service: Gastroenterology;  Laterality: N/A;   HERNIA REPAIR      Social History   Socioeconomic History   Marital status: Significant Other    Spouse name: Not on file   Number of children: Not on file   Years of education: Not on file   Highest education level: Not on file  Occupational History   Not on file  Tobacco Use   Smoking status: Former    Packs/day: 0.25    Types: Cigarettes   Smokeless tobacco: Former    Quit date: 06/10/2011  Vaping Use   Vaping Use: Some days   Substances: Nicotine  Substance and Sexual Activity   Alcohol use: No   Drug use: No   Sexual activity: Yes    Birth control/protection: None  Other Topics Concern   Not on file  Social History Narrative   Not on file   Social Determinants of Health   Financial Resource Strain: Not on file  Food Insecurity: Not on file  Transportation Needs: Not on file  Physical Activity: Not on file  Stress: Not on file  Social Connections: Not on file  Intimate Partner Violence: Not on file    Family History  Problem Relation Age of  Onset   Hypertension Maternal Grandmother     Current Facility-Administered Medications  Medication Dose Route Frequency Provider Last Rate Last Admin   acetaminophen (TYLENOL) suppository 650 mg  650 mg Rectal Q6H PRN Michael Boston, MD       acetaminophen (TYLENOL) tablet 325-650 mg  325-650 mg Oral Q6H PRN Michael Boston, MD       alum & mag hydroxide-simeth (MAALOX/MYLANTA) 200-200-20 MG/5ML suspension 30 mL  30 mL Oral Q6H PRN Michael Boston, MD       diphenhydrAMINE (BENADRYL) injection 12.5-25 mg  12.5-25 mg Intravenous Q6H PRN Michael Boston, MD   12.5 mg at 06/02/22 0022   HYDROmorphone (DILAUDID) injection 0.5-2 mg  0.5-2 mg Intravenous Q2H PRN Michael Boston, MD       lactated ringers bolus 1,000 mL  1,000 mL Intravenous Q8H PRN Michael Boston, MD       lactated ringers infusion   Intravenous Continuous  Lianne Cure, DO 891 mL/hr at 06/01/22 2349 New Bag at 06/01/22 2349   lip balm (CARMEX) ointment   Topical BID Michael Boston, MD   75 Application at 69/45/03 2221   magic mouthwash  15 mL Oral QID PRN Michael Boston, MD       menthol-cetylpyridinium (CEPACOL) lozenge 3 mg  1 lozenge Oral PRN Michael Boston, MD       methocarbamol (ROBAXIN) 1,000 mg in dextrose 5 % 100 mL IVPB  1,000 mg Intravenous Q6H PRN Michael Boston, MD       metoprolol tartrate (LOPRESSOR) injection 5 mg  5 mg Intravenous Q6H PRN Michael Boston, MD       ondansetron [facility]) injection 4 mg  4 mg Intravenous Q6H PRN Michael Boston, MD       Or   ondansetron (ZOFRAN) 8 mg in sodium chloride 0.9 % 50 mL IVPB  8 mg Intravenous Q6H PRN Michael Boston, MD       oxyCODONE (Oxy IR/ROXICODONE) immediate release tablet 5-10 mg  5-10 mg Oral Q4H PRN Michael Boston, MD   10 mg at 06/01/22 2347   pantoprazole (PROTONIX) EC tablet 40 mg  40 mg Oral BID Rogelia Mire, MD   40 mg at 06/02/22 0733   phenol (CHLORASEPTIC) mouth spray 2 spray  2 spray Mouth/Throat PRN Michael Boston, MD       piperacillin-tazobactam (ZOSYN) IVPB 3.375 g  3.375 g Intravenous Tor Netters, MD   Stopped at 06/02/22 8882   prochlorperazine (COMPAZINE) injection 5-10 mg  5-10 mg Intravenous Q4H PRN Michael Boston, MD       Current Outpatient Medications  Medication Sig Dispense Refill   acetaminophen (TYLENOL) 500 MG tablet Take 1 tablet (500 mg total) by mouth every 6 (six) hours as needed. 30 tablet 0   Adalimumab (HUMIRA PEN) 40 MG/0.4ML PNKT Inject 40 mg into the skin every 14 (fourteen) days. Every other Tuesday     carboxymethylcellulose (REFRESH PLUS) 0.5 % SOLN Place 1 drop into both eyes daily as needed (dry eyes).     etonogestrel (NEXPLANON) 68 MG IMPL implant 1 each by Subdermal route once.     insulin glargine (LANTUS) 100 UNIT/ML Solostar Pen Inject 15 Units into the skin 2 (two) times daily. (Patient taking differently: Inject 9 Units into the  skin daily.) 15 mL 2   morphine (MSIR) 30 MG tablet Take 1 tablet (30 mg total) by mouth every 12 (twelve) hours as needed for up to 9 doses for severe pain. 9 tablet 0  blood glucose meter kit and supplies Dispense based on patient and insurance preference. Use up to four times daily as directed. (FOR ICD-10 E10.9, E11.9). 1 each 0   insulin aspart (NOVOLOG) 100 UNIT/ML FlexPen CBG < 70: Implement Hypoglycemia Standing Orders and refer to Hypoglycemia Standing Orders sidebar report CBG 70 - 120: 0 units CBG 121 - 150: 2 units CBG 151 - 200: 3 units CBG 201 - 250: 5 units CBG 251 - 300: 8 units CBG 301 - 350: 11 units CBG 351 - 400: 15 units CBG > 400: call MD and obtain STAT lab verification (Patient not taking: Reported on 06/01/2022) 15 mL 11   Insulin Pen Needle (PEN NEEDLES) 30G X 5 MM MISC 1 Pen by Does not apply route 4 (four) times daily -  before meals and at bedtime. 100 each 1   nicotine (NICODERM CQ - DOSED IN MG/24 HOURS) 14 mg/24hr patch Place 1 patch (14 mg total) onto the skin daily. (Patient not taking: Reported on 06/01/2022) 28 patch 0   oxyCODONE (OXY IR/ROXICODONE) 5 MG immediate release tablet Take 1-2 tablets (5-10 mg total) by mouth every 4 (four) hours as needed for severe pain. (Patient not taking: Reported on 06/01/2022) 30 tablet 0   pantoprazole (PROTONIX) 40 MG tablet Take 1 tablet (40 mg total) by mouth daily. (Patient not taking: Reported on 06/01/2022) 30 tablet 0   predniSONE (DELTASONE) 10 MG tablet Take 5 tablets (50 mg total) by mouth daily. (Patient not taking: Reported on 06/01/2022) 25 tablet 0     Allergies  Allergen Reactions   Nsaids Other (See Comments)    Crohn's disease = NO NSAIDs    ROS:   All other systems reviewed & are negative except per HPI or as noted below: Constitutional:  No fevers, chills, sweats.  Weight stable Eyes:  No vision changes, No discharge HENT:  No sore throats, nasal drainage Lymph: No neck swelling, No bruising easily Pulmonary:  No  cough, productive sputum CV: No orthopnea, PND  Patient walks 30 minutes without difficulty.  No exertional chest/neck/shoulder/arm pain.  GI:  No personal nor family history of GI/colon cancer, irritable bowel syndrome, allergy such as Celiac Sprue, dietary/dairy problems, colitis, ulcers nor gastritis.  No recent sick contacts/gastroenteritis.  No travel outside the country.  No changes in diet.  Renal: No UTIs, No hematuria Genital:  No drainage, bleeding, masses Musculoskeletal: No severe joint pain.  Good ROM major joints Skin:  No sores or lesions Heme/Lymph:  No easy bleeding.  No swollen lymph nodes   BP (!) 94/57   Pulse 85   Temp 98.6 F (37 C) (Oral)   Resp 14   Ht _0  (1.676 m)   Wt 61 kg   SpO2 100%   BMI 21.71 kg/m   Physical Exam:  Constitutional: Not cachectic.  Hygeine adequate.  Vitals signs as above.  Tired but not sickly Eyes: Wearing glasses.  Pupils reactive, normal extraocular movements. Sclera nonicteric Neuro: CN II-XII intact.  No major focal sensory defects.  No major motor deficits. Lymph: No head/neck/groin lymphadenopathy Psych:  No severe agitation.  No severe anxiety.  Judgment & insight Adequate, Oriented x4, HENT: Normocephalic, Mucus membranes moist.  No thrush.   Neck: Supple, No tracheal deviation.  No obvious thyromegaly Chest: No pain to chest wall compression.  Good respiratory excursion.  No audible wheezing CV:  Pulses intact.  regular rhythm.  No major extremity edema  Abdomen: Firmness in right abdomen consistent with  phlegmon with discomfort there.  Rest of the abdomen nontender.  Mildly distended.  Flat Hernia: Not present. Diastasis recti: Not present. Mildly distended.   No hepatomegaly.  No splenomegaly  Gen:  Inguinal hernia: Not present.  Inguinal lymph nodes: without lymphadenopathy.    Rectal: (Deferred)  Ext: No obvious deformity or contracture.  Edema: Not present.  No cyanosis Skin: No major subcutaneous nodules.   Warm and dry Musculoskeletal: Severe joint rigidity not present.  No obvious clubbing.  No digital petechiae.     Results:   Labs: Results for orders placed or performed during the hospital encounter of 06/01/22 (from the past 48 hour(s))  Lipase, blood     Status: None   Collection Time: 06/01/22  3:29 PM  Result Value Ref Range   Lipase 26 11 - 51 U/L    Comment: Performed at [facility], [place] [address], [place], [place] [address]  Comprehensive metabolic panel     Status: Abnormal   Collection Time: 06/01/22  3:29 PM  Result Value Ref Range   Sodium 132 (L) 135 - 145 mmol/L   Potassium 3.5 3.5 - 5.1 mmol/L   Chloride 96 (L) 98 - 111 mmol/L   CO2 25 22 - 32 mmol/L   Glucose, Bld 185 (H) 70 - 99 mg/dL    Comment: Glucose reference range applies only to samples taken after fasting for at least 8 hours.   BUN 9 6 - 20 mg/dL   Creatinine, Ser 0.55 0.44 - 1.00 mg/dL   Calcium 8.9 8.9 - 10.3 mg/dL   Total Protein 6.6 6.5 - 8.1 g/dL   Albumin 3.1 (L) 3.5 - 5.0 g/dL   AST 8 (L) 15 - 41 U/L   ALT 16 0 - 44 U/L   Alkaline Phosphatase 62 38 - 126 U/L   Total Bilirubin 0.8 0.3 - 1.2 mg/dL   GFR, Estimated >60 >60 mL/min    Comment: (NOTE) Calculated using the CKD-EPI Creatinine Equation (2021)    Anion gap 11 5 - 15    Comment: Performed at [facility], [place] [address] [place], [place] [address]  CBC     Status: Abnormal   Collection Time: 06/01/22  3:29 PM  Result Value Ref Range   WBC 25.2 (H) 4.0 - 10.5 K/uL   RBC 4.61 3.87 - 5.11 MIL/uL   Hemoglobin 12.7 12.0 - 15.0 g/dL   HCT 38.9 36.0 - 46.0 %   MCV 84.4 80.0 - 100.0 fL   MCH 27.5 26.0 - 34.0 pg   MCHC 32.6 30.0 - 36.0 g/dL   RDW 15.7 (H) 11.5 - 15.5 %   Platelets 348 150 - 400 K/uL   nRBC 0.0 0.0 - 0.2 %    Comment: Performed at [facility], [place] [address], [place],  [address]  I-Stat beta hCG blood, ED     Status: None   Collection Time: 06/01/22   3:37 PM  Result Value Ref Range   I-stat hCG, quantitative <5.0 <5 mIU/mL   Comment 3            Comment:   GEST. AGE      CONC.  (mIU/mL)   <=1 WEEK        5 - 50     2 WEEKS       50 - 500     3 WEEKS       100 - 10,000     4 WEEKS  1,000 - 30,000        FEMALE AND NON-PREGNANT FEMALE:     LESS THAN 5 mIU/mL   Urinalysis, Routine w reflex microscopic Urine, Clean Catch     Status: Abnormal   Collection Time: 06/01/22  7:05 PM  Result Value Ref Range   Color, Urine YELLOW YELLOW   APPearance HAZY (A) CLEAR   Specific Gravity, Urine 1.011 1.005 - 1.030   pH 6.0 5.0 - 8.0   Glucose, UA NEGATIVE NEGATIVE mg/dL   Hgb urine dipstick MODERATE (A) NEGATIVE   Bilirubin Urine NEGATIVE NEGATIVE   Ketones, ur 20 (A) NEGATIVE mg/dL   Protein, ur NEGATIVE NEGATIVE mg/dL   Nitrite NEGATIVE NEGATIVE   Leukocytes,Ua NEGATIVE NEGATIVE   RBC / HPF 0-5 0 - 5 RBC/hpf   WBC, UA 0-5 0 - 5 WBC/hpf   Bacteria, UA RARE (A) NONE SEEN   Squamous Epithelial / LPF 6-10 0 - 5   Mucus PRESENT     Comment: Performed at [facility], [place] [address] [place], [place] [address]  Prealbumin     Status: Abnormal   Collection Time: 06/01/22 10:09 PM  Result Value Ref Range   Prealbumin 11 (L) 18 - 38 mg/dL    Comment: Performed at [facility], [place] [address], [place], [place] [address]  Basic metabolic panel     Status: Abnormal   Collection Time: 06/02/22  5:44 AM  Result Value Ref Range   Sodium 134 (L) 135 - 145 mmol/L   Potassium 3.1 (L) 3.5 - 5.1 mmol/L   Chloride 100 98 - 111 mmol/L   CO2 26 22 - 32 mmol/L   Glucose, Bld 128 (H) 70 - 99 mg/dL    Comment: Glucose reference range applies only to samples taken after fasting for at least 8 hours.   BUN 8 6 - 20 mg/dL   Creatinine, Ser 0.53 0.44 - 1.00 mg/dL   Calcium 8.0 (L) 8.9 - 10.3 mg/dL   GFR, Estimated >60 >60 mL/min    Comment: (NOTE) Calculated using the CKD-EPI Creatinine Equation (2021)    Anion gap 8 5 - 15     Comment: Performed at [facility], [place] [address] [place], [place] [address]  CBC     Status: Abnormal   Collection Time: 06/02/22  6:45 AM  Result Value Ref Range   WBC 13.2 (H) 4.0 - 10.5 K/uL   RBC 3.33 (L) 3.87 - 5.11 MIL/uL   Hemoglobin 9.3 (L) 12.0 - 15.0 g/dL    Comment: REPEATED TO VERIFY DELTA CHECK NOTED, THIS IS A RECOLLECTED SPECIMEN    HCT 28.3 (L) 36.0 - 46.0 %   MCV 85.0 80.0 - 100.0 fL   MCH 27.9 26.0 - 34.0 pg   MCHC 32.9 30.0 - 36.0 g/dL   RDW 15.6 (H) 11.5 - 15.5 %   Platelets 245 150 - 400 K/uL   nRBC 0.0 0.0 - 0.2 %    Comment: Performed at [facility], [place] [address], [place], [place] [address]    Imaging / Studies: CT ABDOMEN PELVIS W CONTRAST  Result Date: 06/01/2022 CLINICAL DATA:  Right lower quadrant pain for 3 days, history of Crohn's disease EXAM: CT ABDOMEN AND PELVIS WITH CONTRAST TECHNIQUE: Multidetector CT imaging of the abdomen and pelvis was performed using the standard protocol following bolus administration of intravenous contrast. RADIATION DOSE REDUCTION: This exam was performed according to the departmental dose-optimization program which includes automated  exposure control, adjustment of the mA and/or kV according to patient size and/or use of iterative reconstruction technique. CONTRAST:  129m OMNIPAQUE IOHEXOL 300 MG/ML  SOLN COMPARISON:  05/03/2022 FINDINGS: Lower chest: No acute abnormality. Hepatobiliary: No focal liver abnormality is seen. No gallstones, gallbladder wall thickening, or biliary dilatation. Pancreas: Unremarkable. No pancreatic ductal dilatation or surrounding inflammatory changes. Spleen: Normal in size without focal abnormality. Adrenals/Urinary Tract: Adrenal glands are within normal limits. Kidneys demonstrate a normal enhancement pattern bilaterally. The bladder is incompletely distended. There is however significant distension of the collecting systems and ureters bilaterally likely  related to local compression by inflammatory changes in the pelvis. These changes are new from the prior exam. No calculi are identified. Stomach/Bowel: Mild fecal material is noted within the colon. The cecum and distal terminal ileum demonstrates significant inflammatory change much worse than that noted on the prior exam consistent with the known history of Crohn's. The appendix is within normal limits. The distal most aspect of the small bowel is mildly dilated related to the inflammatory changes. A few small air-fluid collections are identified 1 seen on image number 55 of series 2 measuring 2.2 cm and a second measuring 2.3 cm on image number 62 of series 2. These are highly suspicious for focal abscesses and new from the prior exam. An additional area measuring up to 3 cm best seen on image number 64 of series 2 and image number 43 of series 5 is also suspicious for abscess. More proximal small bowel is within normal limits. The stomach is unremarkable. Vascular/Lymphatic: No significant vascular findings are present. No enlarged abdominal or pelvic lymph nodes. Reproductive: Uterus and bilateral adnexa are unremarkable. Other: No hernia is noted.  Free fluid is noted within the pelvis. Musculoskeletal: No acute or significant osseous findings. IMPRESSION: Significant progression of inflammatory change in the right lower quadrant when compared with the prior exam. Multiple small enhancing air-fluid collections are identified suspicious for abscesses and likely related to prior perforation. Free pelvic fluid is noted as well. These changes are consistent with the given clinical history of Crohn's and likely represent an acute flare with perforation and abscess formation. Bilateral hydronephrosis and hydroureter worse on the left than the right likely related to local compression related to the inflammatory changes. Electronically Signed   By: MInez CatalinaM.D.   On: 06/01/2022 20:19   DG Chest Port 1  View  Result Date: 05/22/2022 CLINICAL DATA:  Two days of lower abdominal pain, history of Crohn's disease EXAM: PORTABLE CHEST 1 VIEW COMPARISON:  Radiographs 06/02/2012 FINDINGS: The heart size and mediastinal contours are within normal limits. Both lungs are clear. The visualized skeletal structures are unremarkable. No free air under the diaphragms on semi-erect AP radiograph. IMPRESSION: No acute abnormality. Electronically Signed   By: TPlacido SouM.D.   On: 05/22/2022 14:41   CT Abdomen Pelvis Wo Contrast  Result Date: 05/03/2022 CLINICAL DATA:  RLQ abdominal pain (Age >= 14y) EXAM: CT ABDOMEN AND PELVIS WITHOUT CONTRAST TECHNIQUE: Multidetector CT imaging of the abdomen and pelvis was performed following the standard protocol without IV contrast. RADIATION DOSE REDUCTION: This exam was performed according to the departmental dose-optimization program which includes automated exposure control, adjustment of the mA and/or kV according to patient size and/or use of iterative reconstruction technique. COMPARISON:  March 2023 FINDINGS: Lower chest: No acute abnormality. Hepatobiliary: No focal liver abnormality is seen. No gallstones, gallbladder wall thickening, or biliary dilatation. Pancreas: Unremarkable. Spleen: Unremarkable. Adrenals/Urinary Tract: Adrenals, kidneys,  and poorly distended bladder are unremarkable. Stomach/Bowel: Stomach is within normal limits. There are inflammatory changes in the right lower quadrant about the terminal ileum. Dilatation of distal ileal loops reflecting associated ileus or obstruction. Appendix appears normal. Vascular/Lymphatic: No significant vascular abnormality on this noncontrast study. Likely reactive mesenteric nodes. Reproductive: Uterus and bilateral adnexa are unremarkable. Other: No free air. Musculoskeletal: No acute osseous abnormality. IMPRESSION: Suboptimal evaluation on this study performed without intravenous or enteric contrast. Right lower  quadrant inflammatory changes likely reflecting persistent or recurrent terminal ileitis. Dilatation of distal ileal loops reflects associated ileus or obstruction. Electronically Signed   By: Macy Mis M.D.   On: 05/03/2022 12:15    Medications / Allergies: per chart  Antibiotics: Anti-infectives (From admission, onward)    Start     Dose/Rate Route Frequency Ordered Stop   06/02/22 0300  piperacillin-tazobactam (ZOSYN) IVPB 3.375 g        3.375 g 12.5 mL/hr over 240 Minutes Intravenous Every 8 hours 06/01/22 2209 06/07/22 0259   06/01/22 1900  cefTRIAXone (ROCEPHIN) 2 g in sodium chloride 0.9 % 100 mL IVPB        2 g 200 mL/hr over 30 Minutes Intravenous  Once 06/01/22 1855 06/01/22 1950   06/01/22 1900  metroNIDAZOLE (FLAGYL) IVPB 500 mg        500 mg 100 mL/hr over 60 Minutes Intravenous  Once 06/01/22 1855 06/01/22 2033         Note: Portions of this report may have been transcribed using voice recognition software. Every effort was made to ensure accuracy; however, inadvertent computerized transcription errors may be present.   Any transcriptional errors that result from this process are unintentional.    Adin Hector, MD, FACS, MASCRS Esophageal, Gastrointestinal & Colorectal Surgery Robotic and Minimally Invasive Surgery  [facility]. [address] [place], [place] [address] [phone] Fax [phone] Main  CONTACT INFORMATION:  Weekday (9AM-5PM): Call [facility] main office at [phone]  Weeknight (5PM-9AM) or Weekend/Holiday: Check [email or web address] (password " [email or web address]") for General Surgery [facility] coverage  (Please, do not use SecureChat as it is not reliable communication to reach operating surgeons for immediate patient care)       06/02/2022  7:36 AM

## 2022-06-03 LAB — RENAL FUNCTION PANEL
Albumin: 2.3 g/dL — ABNORMAL LOW (ref 3.5–5.0)
Anion gap: 8 (ref 5–15)
BUN: <5 mg/dL — ABNORMAL LOW (ref 6–20)
CO2: 24 mmol/L (ref 22–32)
Calcium: 8.2 mg/dL — ABNORMAL LOW (ref 8.9–10.3)
Chloride: 103 mmol/L (ref 98–111)
Creatinine, Ser: 0.46 mg/dL (ref 0.44–1.00)
GFR, Estimated: >60 mL/min (ref >60)
Glucose, Bld: 82 mg/dL (ref 70–99)
Phosphorus: 3.7 mg/dL (ref 2.5–4.6)
Potassium: 4.1 mmol/L (ref 3.5–5.1)
Sodium: 135 mmol/L (ref 135–145)

## 2022-06-03 LAB — GLUCOSE, CAPILLARY
Glucose-Capillary: 100 mg/dL — ABNORMAL HIGH (ref 70–99)
Glucose-Capillary: 117 mg/dL — ABNORMAL HIGH (ref 70–99)
Glucose-Capillary: 130 mg/dL — ABNORMAL HIGH (ref 70–99)
Glucose-Capillary: 151 mg/dL — ABNORMAL HIGH (ref 70–99)
Glucose-Capillary: 86 mg/dL (ref 70–99)
Glucose-Capillary: 90 mg/dL (ref 70–99)

## 2022-06-03 LAB — CBC
HCT: 28.6 % — ABNORMAL LOW (ref 36.0–46.0)
Hemoglobin: 9.2 g/dL — ABNORMAL LOW (ref 12.0–15.0)
MCH: 28.1 pg (ref 26.0–34.0)
MCHC: 32.2 g/dL (ref 30.0–36.0)
MCV: 87.5 fL (ref 80.0–100.0)
Platelets: 222 10*3/uL (ref 150–400)
RBC: 3.27 MIL/uL — ABNORMAL LOW (ref 3.87–5.11)
RDW: 15.5 % (ref 11.5–15.5)
WBC: 8.3 10*3/uL (ref 4.0–10.5)
nRBC: 0 % (ref 0.0–0.2)

## 2022-06-03 LAB — HEMOGLOBIN A1C
Hgb A1c MFr Bld: 9.6 % — ABNORMAL HIGH (ref 4.8–5.6)
Mean Plasma Glucose: 228.82 mg/dL

## 2022-06-03 LAB — MAGNESIUM: Magnesium: 1.9 mg/dL (ref 1.7–2.4)

## 2022-06-03 MED ORDER — ENOXAPARIN SODIUM 40 MG/0.4ML IJ SOSY
40.0000 mg | PREFILLED_SYRINGE | INTRAMUSCULAR | Status: DC
Start: 2022-06-03 — End: 2022-06-08
  Administered 2022-06-03 – 2022-06-08 (×6): 40 mg via SUBCUTANEOUS
  Filled 2022-06-03 (×6): qty 0.4

## 2022-06-03 NOTE — Progress Notes (Signed)
Sandra Foster [id number] [birth year]  CARE TEAM:  PCP: Patient, No Pcp Per  Outpatient Care Team: Patient Care Team: Patient, No Pcp Per as PCP - General (General Practice) Charna Elizabeth, MD as Consulting Physician (Gastroenterology) Kathreen Cosier, MD as Referring Physician (Obstetrics and Gynecology) Quita Skye, PA-C (Physician Assistant)  Inpatient Treatment Team: Treatment Team: Attending Provider: Almon Hercules, MD; Consulting Physician: Bishop Limbo, MD; Consulting Physician: Jeani Hawking, MD; Rounding Team: Armida Sans, MD; Radiologist: [facility], MD; Consulting Physician: Jenel Lucks, MD; Charge Nurse: Clarita Leber, RN; Registered Nurse: Riki Sheer, RN; Utilization Review: Deveron Furlong, RN   Problem List:   Principal Problem:   Crohn's disease of ileum, with abscess [facility]) Active Problems:   Insulin-requiring or dependent type II diabetes mellitus (HCC)   Tobacco abuse   Uncontrolled NIDDM-2 with hyperglycemia   Iron deficiency anemia   Acute Crohn's disease (HCC)   Gastroesophageal reflux disease   Sepsis (HCC)      * No surgery found *      Assessment  Progressive Crohn's disease with abscess formation and perhaps internal fistulae  [facility] Stay = 2 days)  Plan:  -Try clear liquids.  Continue IV antibiotics and drains to hopefully help turn this around.  Immunosuppression per gastroenterology.  Sounds like they would hold off on adding steroids for now.  Surgery will help follow clinically.  If obstruction resolved and feeling better perhaps can transition to outpatient care and revisit.  If worsens or does not improve, may require surgery this hospitalization.  We will see.  Patient would like to avoid surgery and temporary ostomy possible understandably.  I cautioned that it still may come to that but she seems better so try and hold off.  No hard indications for emergency surgery today.  Dr. Doylene Canard  to assume surgical consultation care starting tomorrow for the rest of the week  VTE prophylaxis- SCDs, etc  Mobilize as tolerated to help recovery  Disposition: per primary service      I reviewed Consultant GI notes, hospitalist notes, last 24 h vitals and pain scores, last 48 h intake and output, last 24 h labs and trends, and last 24 h imaging results. I have reviewed this patient's available data, including medical history, events of note, test results, etc as part of my evaluation.  A significant portion of that time was spent in counseling.  Care during the described time interval was provided by me.  This care required moderate level of medical decision making.  06/03/2022    Subjective: (Chief complaint)  Drains placed.  Some discomfort at drains but overall feeling better.  Not nauseated.  Passed some gas although some cramping and burning with that.  Objective:  Vital signs:  Vitals:   06/02/22 1725 06/02/22 2127 06/03/22 0139 06/03/22 0516  BP: (!) 105/55 98/60 (!) 92/53 97/61  Pulse: 95 89 71 80  Resp: 18 18 18 18   Temp:  98.5 F (36.9 C) 98.7 F (37.1 C) 98.5 F (36.9 C)  TempSrc:  Oral Oral Oral  SpO2: 98% 93% 99% 94%  Weight:      Height:           Intake/Output   Yesterday:  08/05 0701 - 08/06 0700 In: 3696.7 [I.V.:1921.9; IV Piggyback:1774.8] Out: 2801 [Urine:2750; Drains:51] This shift:  No intake/output data recorded.  Bowel function:  Flatus: YES  BM:  No  Drain: Sanguinopurulent   Physical Exam:  General: Pt resting comfortably and  awakens to be no acute distress Eyes: PERRL, normal EOM.  Sclera clear.  No icterus Neuro: CN II-XII intact w/o focal sensory/motor deficits. Lymph: No head/neck/groin lymphadenopathy Psych:  No delerium/psychosis/paranoia.  Oriented x 4 HENT: Normocephalic, Mucus membranes moist.  No thrush Neck: Supple, No tracheal deviation.  No obvious thyromegaly Chest: No pain to chest wall compression.   Good respiratory excursion.  No audible wheezing CV:  Pulses intact.  Regular rhythm.  No major extremity edema MS: Normal AROM mjr joints.  No obvious deformity  Abdomen: Soft.  Nondistended.   Tenderness on right side of abdomen mainly at drain site.  Much less sensitive.  No peritonitis.  Left-sided abdomen soft.  Lower abdomen soft .  No incarcerated hernias.  Ext:  No deformity.  No mjr edema.  No cyanosis Skin: No petechiae / purpurea.  No major sores.  Warm and dry    Results:   Cultures: Recent Results (from the past 720 hour(s))  Culture, blood (single)     Status: None (Preliminary result)   Collection Time: 06/01/22  7:26 PM   Specimen: BLOOD  Result Value Ref Range Status   Specimen Description   Final    BLOOD SITE NOT SPECIFIED Performed at [facility], [address], [place], [place] [address]    Special Requests   Final    BOTTLES DRAWN AEROBIC AND ANAEROBIC Blood Culture adequate volume Performed at [facility], [address], [place], [place] [address]    Culture   Final    NO GROWTH < 12 HOURS Performed at [facility], [address], [place], [place] [address]    Report Status PENDING  Incomplete  Aerobic/Anaerobic Culture w Gram Stain (surgical/deep wound)     Status: None (Preliminary result)   Collection Time: 06/02/22  5:10 PM   Specimen: Abdomen; Abscess  Result Value Ref Range Status   Specimen Description   Final    ABDOMEN Performed at [facility], [address] [place], [place] [address]    Special Requests   Final    NONE Performed at [facility], [address] [place], [place] [address]    Gram Stain   Final    ABUNDANT WBC PRESENT,BOTH PMN AND MONONUCLEAR FEW GRAM POSITIVE COCCI IN PAIRS AND CHAINS Performed at [facility], [address], [place], [place] [address]    Culture PENDING  Incomplete   Report Status PENDING  Incomplete     Labs: Results for orders placed or performed during the hospital encounter of 06/01/22 (from the past 48 hour(s))  Lipase, blood     Status: None   Collection Time: 06/01/22  3:29 PM  Result Value Ref Range   Lipase 26 11 - 51 U/L    Comment: Performed at [facility], [address], [place], [place] [address]  Comprehensive metabolic panel     Status: Abnormal   Collection Time: 06/01/22  3:29 PM  Result Value Ref Range   Sodium 132 (L) 135 - 145 mmol/L   Potassium 3.5 3.5 - 5.1 mmol/L   Chloride 96 (L) 98 - 111 mmol/L   CO2 25 22 - 32 mmol/L   Glucose, Bld 185 (H) 70 - 99 mg/dL    Comment: Glucose reference range applies only to samples taken after fasting for at least 8 hours.   BUN 9 6 - 20 mg/dL   Creatinine, Ser 1.66 0.44 - 1.00 mg/dL  Calcium 8.9 8.9 - 10.3 mg/dL   Total Protein 6.6 6.5 - 8.1 g/dL   Albumin 3.1 (L) 3.5 - 5.0 g/dL   AST 8 (L) 15 - 41 U/L   ALT 16 0 - 44 U/L   Alkaline Phosphatase 62 38 - 126 U/L   Total Bilirubin 0.8 0.3 - 1.2 mg/dL   GFR, Estimated >69 >62 mL/min    Comment: (NOTE) Calculated using the CKD-EPI Creatinine Equation (2021)    Anion gap 11 5 - 15    Comment: Performed at [facility], [address] [place], [place] [address]  CBC     Status: Abnormal   Collection Time: 06/01/22  3:29 PM  Result Value Ref Range   WBC 25.2 (H) 4.0 - 10.5 K/uL   RBC 4.61 3.87 - 5.11 MIL/uL   Hemoglobin 12.7 12.0 - 15.0 g/dL   HCT 13.2 44.0 - 10.2 %   MCV 84.4 80.0 - 100.0 fL   MCH 27.5 26.0 - 34.0 pg   MCHC 32.6 30.0 - 36.0 g/dL   RDW 72.5 (H) 36.6 - 44.0 %   Platelets 348 150 - 400 K/uL   nRBC 0.0 0.0 - 0.2 %    Comment: Performed at [facility], [address] [place], [place] [address]  I-Stat beta hCG blood, ED     Status: None   Collection Time: 06/01/22  3:37 PM  Result Value Ref Range   I-stat hCG, quantitative <5.0 <5 mIU/mL   Comment 3            Comment:   GEST. AGE       CONC.  (mIU/mL)   <=1 WEEK        5 - 50     2 WEEKS       50 - 500     3 WEEKS       100 - 10,000     4 WEEKS     1,000 - 30,000        FEMALE AND NON-PREGNANT FEMALE:     LESS THAN 5 mIU/mL   Urinalysis, Routine w reflex microscopic Urine, Clean Catch     Status: Abnormal   Collection Time: 06/01/22  7:05 PM  Result Value Ref Range   Color, Urine YELLOW YELLOW   APPearance HAZY (A) CLEAR   Specific Gravity, Urine 1.011 1.005 - 1.030   pH 6.0 5.0 - 8.0   Glucose, UA NEGATIVE NEGATIVE mg/dL   Hgb urine dipstick MODERATE (A) NEGATIVE   Bilirubin Urine NEGATIVE NEGATIVE   Ketones, ur 20 (A) NEGATIVE mg/dL   Protein, ur NEGATIVE NEGATIVE mg/dL   Nitrite NEGATIVE NEGATIVE   Leukocytes,Ua NEGATIVE NEGATIVE   RBC / HPF 0-5 0 - 5 RBC/hpf   WBC, UA 0-5 0 - 5 WBC/hpf   Bacteria, UA RARE (A) NONE SEEN   Squamous Epithelial / LPF 6-10 0 - 5   Mucus PRESENT     Comment: Performed at [facility], [address] [place], [place] [address]  Culture, blood (single)     Status: None (Preliminary result)   Collection Time: 06/01/22  7:26 PM   Specimen: BLOOD  Result Value Ref Range   Specimen Description      BLOOD SITE NOT SPECIFIED Performed at [facility], [address], [place], [place] [address]    Special Requests      BOTTLES DRAWN AEROBIC AND ANAEROBIC Blood Culture adequate volume Performed at  [facility], [address] [place], [place] [address]    Culture      NO GROWTH < 12 HOURS Performed at [facility], [address] [place], [place] [address]    Report Status PENDING   Prealbumin     Status: Abnormal   Collection Time: 06/01/22 10:09 PM  Result Value Ref Range   Prealbumin 11 (L) 18 - 38 mg/dL    Comment: Performed at [facility], [address] [place], [place] [address]  Basic metabolic panel     Status: Abnormal   Collection Time: 06/02/22  5:44 AM  Result Value Ref Range   Sodium 134  (L) 135 - 145 mmol/L   Potassium 3.1 (L) 3.5 - 5.1 mmol/L   Chloride 100 98 - 111 mmol/L   CO2 26 22 - 32 mmol/L   Glucose, Bld 128 (H) 70 - 99 mg/dL    Comment: Glucose reference range applies only to samples taken after fasting for at least 8 hours.   BUN 8 6 - 20 mg/dL   Creatinine, Ser 5.73 0.44 - 1.00 mg/dL   Calcium 8.0 (L) 8.9 - 10.3 mg/dL   GFR, Estimated >22 >02 mL/min    Comment: (NOTE) Calculated using the CKD-EPI Creatinine Equation (2021)    Anion gap 8 5 - 15    Comment: Performed at [facility], [address], [place], [place] [address]  CBC     Status: Abnormal   Collection Time: 06/02/22  6:45 AM  Result Value Ref Range   WBC 13.2 (H) 4.0 - 10.5 K/uL   RBC 3.33 (L) 3.87 - 5.11 MIL/uL   Hemoglobin 9.3 (L) 12.0 - 15.0 g/dL    Comment: REPEATED TO VERIFY DELTA CHECK NOTED, THIS IS A RECOLLECTED SPECIMEN    HCT 28.3 (L) 36.0 - 46.0 %   MCV 85.0 80.0 - 100.0 fL   MCH 27.9 26.0 - 34.0 pg   MCHC 32.9 30.0 - 36.0 g/dL   RDW 62.3 (H) 76.2 - 83.1 %   Platelets 245 150 - 400 K/uL   nRBC 0.0 0.0 - 0.2 %    Comment: Performed at [facility], [address], [place], [place] [address]  Magnesium     Status: None   Collection Time: 06/02/22  7:54 AM  Result Value Ref Range   Magnesium 1.8 1.7 - 2.4 mg/dL    Comment: Performed at [facility], [address] [place], [place] [address]  Phosphorus     Status: None   Collection Time: 06/02/22  7:54 AM  Result Value Ref Range   Phosphorus 2.8 2.5 - 4.6 mg/dL    Comment: Performed at [facility], [address] [place], [place] [address]  Comprehensive metabolic panel     Status: Abnormal   Collection Time: 06/02/22  7:54 AM  Result Value Ref Range   Sodium 140 135 - 145 mmol/L   Potassium 3.4 (L) 3.5 - 5.1 mmol/L   Chloride 111 98 - 111 mmol/L   CO2 25 22 - 32 mmol/L   Glucose, Bld 119 (H) 70 - 99 mg/dL    Comment: Glucose reference range  applies only to samples taken after fasting for at least 8 hours.   BUN 7 6 - 20 mg/dL   Creatinine, Ser 9.48 0.44 - 1.00 mg/dL   Calcium 7.8 (L) 8.9 - 10.3 mg/dL   Total Protein 4.4 (L) 6.5 -  8.1 g/dL   Albumin 1.9 (L) 3.5 - 5.0 g/dL   AST 6 (L) 15 - 41 U/L   ALT 10 0 - 44 U/L   Alkaline Phosphatase 43 38 - 126 U/L   Total Bilirubin 0.3 0.3 - 1.2 mg/dL   GFR, Estimated >16 >10 mL/min    Comment: (NOTE) Calculated using the CKD-EPI Creatinine Equation (2021)    Anion gap 4 (L) 5 - 15    Comment: Performed at [facility], [address] [place], [place] [address]  CBC     Status: Abnormal   Collection Time: 06/02/22  7:54 AM  Result Value Ref Range   WBC 11.2 (H) 4.0 - 10.5 K/uL   RBC 3.14 (L) 3.87 - 5.11 MIL/uL   Hemoglobin 8.8 (L) 12.0 - 15.0 g/dL   HCT 40.9 (L) 81.1 - 91.4 %   MCV 85.7 80.0 - 100.0 fL   MCH 28.0 26.0 - 34.0 pg   MCHC 32.7 30.0 - 36.0 g/dL   RDW 78.2 (H) 95.6 - 21.3 %   Platelets 205 150 - 400 K/uL   nRBC 0.0 0.0 - 0.2 %    Comment: Performed at [facility], [address], [place], [place] [address]  Protime-INR     Status: Abnormal   Collection Time: 06/02/22  7:54 AM  Result Value Ref Range   Prothrombin Time 18.4 (H) 11.4 - 15.2 seconds   INR 1.6 (H) 0.8 - 1.2    Comment: (NOTE) INR goal varies based on device and disease states. Performed at [facility], [address], [place], [place] [address]   APTT     Status: Abnormal   Collection Time: 06/02/22  7:54 AM  Result Value Ref Range   aPTT 37 (H) 24 - 36 seconds    Comment:        IF BASELINE aPTT IS ELEVATED, SUGGEST PATIENT RISK ASSESSMENT BE USED TO DETERMINE APPROPRIATE ANTICOAGULANT THERAPY. Performed at [facility], [address], [place], [place] [address]   Lactic acid, plasma     Status: None   Collection Time: 06/02/22  7:54 AM  Result Value Ref Range   Lactic Acid, Venous 1.0 0.5 - 1.9 mmol/L     Comment: Performed at [facility], [address], [place], [place] [address]  CBG monitoring, ED     Status: Abnormal   Collection Time: 06/02/22  8:03 AM  Result Value Ref Range   Glucose-Capillary 122 (H) 70 - 99 mg/dL    Comment: Glucose reference range applies only to samples taken after fasting for at least 8 hours.  Glucose, capillary     Status: None   Collection Time: 06/02/22  4:05 PM  Result Value Ref Range   Glucose-Capillary 91 70 - 99 mg/dL    Comment: Glucose reference range applies only to samples taken after fasting for at least 8 hours.  C-reactive protein     Status: Abnormal   Collection Time: 06/02/22  5:06 PM  Result Value Ref Range   CRP 23.5 (H) <1.0 mg/dL    Comment: Performed at [facility], [address], [place], [place] [address]  Aerobic/Anaerobic Culture w Gram Stain (surgical/deep wound)     Status: None (Preliminary result)   Collection Time: 06/02/22  5:10 PM   Specimen: Abdomen; Abscess  Result Value Ref Range   Specimen Description      ABDOMEN Performed at [facility],  [address], [place], [place] [address]    Special Requests      NONE Performed at [facility], [address], [place], [place] [address]    Gram Stain      ABUNDANT WBC PRESENT,BOTH PMN AND MONONUCLEAR FEW GRAM POSITIVE COCCI IN PAIRS AND CHAINS Performed at [facility], [address], [place], [place] [address]    Culture PENDING    Report Status PENDING   Glucose, capillary     Status: Abnormal   Collection Time: 06/02/22  8:06 PM  Result Value Ref Range   Glucose-Capillary 100 (H) 70 - 99 mg/dL    Comment: Glucose reference range applies only to samples taken after fasting for at least 8 hours.  Glucose, capillary     Status: Abnormal   Collection Time: 06/03/22 12:04 AM  Result Value Ref Range   Glucose-Capillary 100 (H) 70 - 99 mg/dL    Comment: Glucose reference range applies only to  samples taken after fasting for at least 8 hours.  Renal function panel     Status: Abnormal   Collection Time: 06/03/22  5:12 AM  Result Value Ref Range   Sodium 135 135 - 145 mmol/L   Potassium 4.1 3.5 - 5.1 mmol/L    Comment: DELTA CHECK NOTED   Chloride 103 98 - 111 mmol/L   CO2 24 22 - 32 mmol/L   Glucose, Bld 82 70 - 99 mg/dL    Comment: Glucose reference range applies only to samples taken after fasting for at least 8 hours.   BUN <5 (L) 6 - 20 mg/dL   Creatinine, Ser 2.13 0.44 - 1.00 mg/dL   Calcium 8.2 (L) 8.9 - 10.3 mg/dL   Phosphorus 3.7 2.5 - 4.6 mg/dL   Albumin 2.3 (L) 3.5 - 5.0 g/dL   GFR, Estimated >08 >65 mL/min    Comment: (NOTE) Calculated using the CKD-EPI Creatinine Equation (2021)    Anion gap 8 5 - 15    Comment: Performed at [facility], [address] [place], [place] [address]  Magnesium     Status: None   Collection Time: 06/03/22  5:12 AM  Result Value Ref Range   Magnesium 1.9 1.7 - 2.4 mg/dL    Comment: Performed at [facility], [address] [place], [place] [address]  CBC     Status: Abnormal   Collection Time: 06/03/22  5:12 AM  Result Value Ref Range   WBC 8.3 4.0 - 10.5 K/uL   RBC 3.27 (L) 3.87 - 5.11 MIL/uL   Hemoglobin 9.2 (L) 12.0 - 15.0 g/dL   HCT 84.1 (L) 32.4 - 40.1 %   MCV 87.5 80.0 - 100.0 fL   MCH 28.1 26.0 - 34.0 pg   MCHC 32.2 30.0 - 36.0 g/dL   RDW 02.7 25.3 - 66.4 %   Platelets 222 150 - 400 K/uL   nRBC 0.0 0.0 - 0.2 %    Comment: Performed at [facility], [address], [place], [place] [address]  Glucose, capillary     Status: None   Collection Time: 06/03/22  5:17 AM  Result Value Ref Range   Glucose-Capillary 86 70 - 99 mg/dL    Comment: Glucose reference range applies only to samples taken after fasting for at least 8 hours.  Glucose, capillary     Status: None   Collection Time: 06/03/22  7:19 AM  Result Value Ref Range   Glucose-Capillary  90 70 - 99  mg/dL    Comment: Glucose reference range applies only to samples taken after fasting for at least 8 hours.    Imaging / Studies: CT ABDOMEN PELVIS W CONTRAST  Result Date: 06/01/2022 CLINICAL DATA:  Right lower quadrant pain for 3 days, history of Crohn's disease EXAM: CT ABDOMEN AND PELVIS WITH CONTRAST TECHNIQUE: Multidetector CT imaging of the abdomen and pelvis was performed using the standard protocol following bolus administration of intravenous contrast. RADIATION DOSE REDUCTION: This exam was performed according to the departmental dose-optimization program which includes automated exposure control, adjustment of the mA and/or kV according to patient size and/or use of iterative reconstruction technique. CONTRAST:  OMNIPAQUE IOHEXOL 300 MG/ML  SOLN COMPARISON:  05/03/2022 FINDINGS: Lower chest: No acute abnormality. Hepatobiliary: No focal liver abnormality is seen. No gallstones, gallbladder wall thickening, or biliary dilatation. Pancreas: Unremarkable. No pancreatic ductal dilatation or surrounding inflammatory changes. Spleen: Normal in size without focal abnormality. Adrenals/Urinary Tract: Adrenal glands are within normal limits. Kidneys demonstrate a normal enhancement pattern bilaterally. The bladder is incompletely distended. There is however significant distension of the collecting systems and ureters bilaterally likely related to local compression by inflammatory changes in the pelvis. These changes are new from the prior exam. No calculi are identified. Stomach/Bowel: Mild fecal material is noted within the colon. The cecum and distal terminal ileum demonstrates significant inflammatory change much worse than that noted on the prior exam consistent with the known history of Crohn's. The appendix is within normal limits. The distal most aspect of the small bowel is mildly dilated related to the inflammatory changes. A few small air-fluid collections are identified 1 seen on image  number 55 of series 2 measuring 2.2 cm and a second measuring 2.3 cm on image number 62 of series 2. These are highly suspicious for focal abscesses and new from the prior exam. An additional area measuring up to 3 cm best seen on image number 64 of series 2 and image number 43 of series 5 is also suspicious for abscess. More proximal small bowel is within normal limits. The stomach is unremarkable. Vascular/Lymphatic: No significant vascular findings are present. No enlarged abdominal or pelvic lymph nodes. Reproductive: Uterus and bilateral adnexa are unremarkable. Other: No hernia is noted.  Free fluid is noted within the pelvis. Musculoskeletal: No acute or significant osseous findings. IMPRESSION: Significant progression of inflammatory change in the right lower quadrant when compared with the prior exam. Multiple small enhancing air-fluid collections are identified suspicious for abscesses and likely related to prior perforation. Free pelvic fluid is noted as well. These changes are consistent with the given clinical history of Crohn's and likely represent an acute flare with perforation and abscess formation. Bilateral hydronephrosis and hydroureter worse on the left than the right likely related to local compression related to the inflammatory changes. Electronically Signed   By: Alcide Clever M.D.   On: 06/01/2022 20:19    Medications / Allergies: per chart  Antibiotics: Anti-infectives (From admission, onward)    Start     Dose/Rate Route Frequency Ordered Stop   06/02/22 2200  cefTRIAXone (ROCEPHIN) 2 g in sodium chloride 0.9 % 100 mL IVPB  Status:  Discontinued        2 g 200 mL/hr over 30 Minutes Intravenous Every 24 hours 06/02/22 0753 06/02/22 0820   06/02/22 0753  metroNIDAZOLE (FLAGYL) IVPB 500 mg  Status:  Discontinued        500 mg 100 mL/hr over 60  Minutes Intravenous Every 12 hours 06/02/22 0753 06/02/22 0820   06/02/22 0300  piperacillin-tazobactam (ZOSYN) IVPB 3.375 g         3.375 g 12.5 mL/hr over 240 Minutes Intravenous Every 8 hours 06/01/22 2209 06/07/22 0259   06/01/22 1900  cefTRIAXone (ROCEPHIN) 2 g in sodium chloride 0.9 % 100 mL IVPB        2 g 200 mL/hr over 30 Minutes Intravenous  Once 06/01/22 1855 06/01/22 1950   06/01/22 1900  metroNIDAZOLE (FLAGYL) IVPB 500 mg        500 mg 100 mL/hr over 60 Minutes Intravenous  Once 06/01/22 1855 06/01/22 2033         Note: Portions of this report may have been transcribed using voice recognition software. Every effort was made to ensure accuracy; however, inadvertent computerized transcription errors may be present.   Any transcriptional errors that result from this process are unintentional.    Ardeth Sportsman, MD, FACS, MASCRS Esophageal, Gastrointestinal & Colorectal Surgery Robotic and Minimally Invasive Surgery  [facility] [address] [place], [place] [address] [phone] Fax [phone] Main  CONTACT INFORMATION:  Weekday (9AM-5PM): Call [facility] main office at [phone]  Weeknight (5PM-9AM) or Weekend/Holiday: Check [email or web address] (password " [email or web address]") for General Surgery [facility] coverage  (Please, do not use SecureChat as it is not reliable communication to reach operating surgeons for immediate patient care)      06/03/2022  7:31 AM

## 2022-06-03 NOTE — Progress Notes (Signed)
[facility] ROUNDING NOTE   Subjective: Patient feeling better today.  Both pain and nausea are improved.  She is trying to move around a little bit, but still reports pain with any movement.  No fevers or chills.  Leukocytosis continues to improve.   Objective: Vital signs in last 24 hours: Temp:  [98.5 F (36.9 C)-98.7 F (37.1 C)] 98.5 F (36.9 C) (08/06 0516) Pulse Rate:  [70-96] 80 (08/06 0516) Resp:  [0-25] 18 (08/06 0516) BP: (90-115)/(47-70) 97/61 (08/06 0516) SpO2:  [93 %-100 %] 94 % (08/06 0516) Last BM Date : 04/24/22 General: NAD, pleasant African-American female lying in bed Lungs:  CTA b/l, no w/r/r Heart:  RRR, no m/r/g Abdomen:  Soft, tenderness to very light palpation in the right lower quadrant, ND, +BS; 2 JP drains exiting right lower quadrant, scant serosanguineous fluid Ext:  No c/c/e    Intake/Output from previous day: 08/05 0701 - 08/06 0700 In: 3696.7 [I.V.:1921.9; IV Piggyback:1774.8] Out: 2801 [Urine:2750; Drains:51] Intake/Output this shift: Total I/O In: -  Out: 750 [Urine:750]   Lab Results: Recent Labs    06/02/22 0645 06/02/22 0754 06/03/22 0512  WBC 13.2* 11.2* 8.3  HGB 9.3* 8.8* 9.2*  PLT 245 205 222  MCV 85.0 85.7 87.5   BMET Recent Labs    06/02/22 0544 06/02/22 0754 06/03/22 0512  NA 134* 140 135  K 3.1* 3.4* 4.1  CL 100 111 103  CO2 26 25 24   GLUCOSE 128* 119* 82  BUN 8 7 <5*  CREATININE 0.53 0.51 0.46  CALCIUM 8.0* 7.8* 8.2*   LFT Recent Labs    06/01/22 1529 06/02/22 0754 06/03/22 0512  PROT 6.6 4.4*  --   ALBUMIN 3.1* 1.9* 2.3*  AST 8* 6*  --   ALT 16 10  --   ALKPHOS 62 43  --   BILITOT 0.8 0.3  --    PT/INR Recent Labs    06/02/22 0754  INR 1.6*      Imaging/Other results: CT ABDOMEN PELVIS W CONTRAST  Result Date: 06/01/2022 CLINICAL DATA:  Right lower quadrant pain for 3 days, history of Crohn's disease EXAM: CT ABDOMEN AND PELVIS WITH CONTRAST TECHNIQUE: Multidetector CT  imaging of the abdomen and pelvis was performed using the standard protocol following bolus administration of intravenous contrast. RADIATION DOSE REDUCTION: This exam was performed according to the departmental dose-optimization program which includes automated exposure control, adjustment of the mA and/or kV according to patient size and/or use of iterative reconstruction technique. CONTRAST:  08/01/2022 OMNIPAQUE IOHEXOL 300 MG/ML  SOLN COMPARISON:  05/03/2022 FINDINGS: Lower chest: No acute abnormality. Hepatobiliary: No focal liver abnormality is seen. No gallstones, gallbladder wall thickening, or biliary dilatation. Pancreas: Unremarkable. No pancreatic ductal dilatation or surrounding inflammatory changes. Spleen: Normal in size without focal abnormality. Adrenals/Urinary Tract: Adrenal glands are within normal limits. Kidneys demonstrate a normal enhancement pattern bilaterally. The bladder is incompletely distended. There is however significant distension of the collecting systems and ureters bilaterally likely related to local compression by inflammatory changes in the pelvis. These changes are new from the prior exam. No calculi are identified. Stomach/Bowel: Mild fecal material is noted within the colon. The cecum and distal terminal ileum demonstrates significant inflammatory change much worse than that noted on the prior exam consistent with the known history of Crohn's. The appendix is within normal limits. The distal most aspect of the small bowel is mildly dilated related to the inflammatory changes. A few small air-fluid collections are identified 1  seen on image number 55 of series 2 measuring 2.2 cm and a second measuring 2.3 cm on image number 62 of series 2. These are highly suspicious for focal abscesses and new from the prior exam. An additional area measuring up to 3 cm best seen on image number 64 of series 2 and image number 43 of series 5 is also suspicious for abscess. More proximal small  bowel is within normal limits. The stomach is unremarkable. Vascular/Lymphatic: No significant vascular findings are present. No enlarged abdominal or pelvic lymph nodes. Reproductive: Uterus and bilateral adnexa are unremarkable. Other: No hernia is noted.  Free fluid is noted within the pelvis. Musculoskeletal: No acute or significant osseous findings. IMPRESSION: Significant progression of inflammatory change in the right lower quadrant when compared with the prior exam. Multiple small enhancing air-fluid collections are identified suspicious for abscesses and likely related to prior perforation. Free pelvic fluid is noted as well. These changes are consistent with the given clinical history of Crohn's and likely represent an acute flare with perforation and abscess formation. Bilateral hydronephrosis and hydroureter worse on the left than the right likely related to local compression related to the inflammatory changes. Electronically Signed   By: Alcide Clever M.D.   On: 06/01/2022 20:19    Prealbumin 18 - 38 mg/dL 11 Low     Component Ref Range & Units 1 d ago (06/02/22) 1 mo ago (04/22/22) 4 mo ago (01/11/22) 4 mo ago (01/10/22) 4 mo ago (01/09/22)  CRP <1.0 mg/dL 10.6 High   26.9 High  CM  4.1 High  CM  13.9 High  CM  12.0 High     Assessment and Plan:  31 year old female with initial presentation of stenotic ileal Crohn's disease, with progression of disease on steroids and Humira, presenting with intra-abdominal abscess, now s/p percutaneous drainage. Given the progression of disease on Humira and steroids, patient should be considered for alternative therapies, but this should be a decision made between her and her primary gastroenterologist as an outpatient.  In the setting of an active abscess, would not initiate any immune suppressing agents, and would hold steroids for now.  For the immediate time being, recommend antibiotics and pain control. Ok starting clear liquid diet tomorrow from GI  standpoint, but defer to IR. Patient high risk for VTE and will need chemoprophylaxis once safe from post-procedural bleeding standpoint.     Penetrating ileal Crohn's disease with intra-abdominal abscess - Continue Zosyn - Hold steroids for now; will resume once infection no longer concern; perhaps tomorrow? - CLD diet today ok from GI standpoint - Adalimumab levels/antibodies pending - CRP 24 yesterday, recheck tomorrow - Recommend nutrition consult - Continue lovenox for DVT prophylaxis  -Dr. Nicholes Mango will be assuming GI care of the patient tomorrow   Jenel Lucks, MD  06/03/2022, 9:03 AM [facility]

## 2022-06-03 NOTE — Progress Notes (Signed)
Referring Physician(s): Gross,S  Supervising Physician: Ruthann Cancer  Patient Status:  [name]  Chief Complaint:  Abdominal pain/abscesses  Subjective: Sandra Foster states that she feels better since abd drains placed yesterday; denies fever,N/V; has passed some gas; no BM   Allergies: Nsaids  Medications: Prior to Admission medications   Medication Sig Start Date End Date Taking? Authorizing Provider  acetaminophen (TYLENOL) 500 MG tablet Take 1 tablet (500 mg total) by mouth every 6 (six) hours as needed. 05/22/22  Yes Nanavati, Ankit, MD  Adalimumab (HUMIRA PEN) 40 MG/0.4ML PNKT Inject 40 mg into the skin every 14 (fourteen) days. Every other Tuesday   Yes [provider]  carboxymethylcellulose (REFRESH PLUS) 0.5 % SOLN Place 1 drop into both eyes daily as needed (dry eyes).   Yes [provider]  etonogestrel (NEXPLANON) 68 MG IMPL implant 1 each by Subdermal route once.   Yes [provider]  insulin glargine (LANTUS) 100 UNIT/ML Solostar Pen Inject 15 Units into the skin 2 (two) times daily. Patient taking differently: Inject 9 Units into the skin daily. 04/26/22  Yes Mercy Riding, MD  morphine (MSIR) 30 MG tablet Take 1 tablet (30 mg total) by mouth every 12 (twelve) hours as needed for up to 9 doses for severe pain. 05/22/22  Yes Varney Biles, MD  blood glucose meter kit and supplies Dispense based on patient and insurance preference. Use up to four times daily as directed. (FOR ICD-10 E10.9, E11.9). 04/26/22   Mercy Riding, MD  insulin aspart (NOVOLOG) 100 UNIT/ML FlexPen CBG < 70: Implement Hypoglycemia Standing Orders and refer to Hypoglycemia Standing Orders sidebar report CBG 70 - 120: 0 units CBG 121 - 150: 2 units CBG 151 - 200: 3 units CBG 201 - 250: 5 units CBG 251 - 300: 8 units CBG 301 - 350: 11 units CBG 351 - 400: 15 units CBG > 400: call MD and obtain STAT lab verification Patient not taking: Reported on 06/01/2022 04/26/22   Mercy Riding, MD  Insulin Pen Needle (PEN NEEDLES) 30G X 5 MM MISC 1 Pen by Does not apply route 4 (four) times daily -  before meals and at bedtime. 04/26/22   Mercy Riding, MD  nicotine (NICODERM CQ - DOSED IN MG/24 HOURS) 14 mg/24hr patch Place 1 patch (14 mg total) onto the skin daily. Patient not taking: Reported on 06/01/2022 04/26/22   Mercy Riding, MD  oxyCODONE (OXY IR/ROXICODONE) 5 MG immediate release tablet Take 1-2 tablets (5-10 mg total) by mouth every 4 (four) hours as needed for severe pain. Patient not taking: Reported on 06/01/2022 05/04/22   Cherene Altes, MD  pantoprazole (PROTONIX) 40 MG tablet Take 1 tablet (40 mg total) by mouth daily. Patient not taking: Reported on 06/01/2022 04/26/22 05/26/22  Mercy Riding, MD  predniSONE (DELTASONE) 10 MG tablet Take 5 tablets (50 mg total) by mouth daily. Patient not taking: Reported on 06/01/2022 05/22/22   Varney Biles, MD  amitriptyline (ELAVIL) 25 MG tablet Take 1-2 tablets (25-50 mg total) by mouth at bedtime as needed (headache). 03/25/20 06/18/20  Gregor Hams, MD     Vital Signs: BP 97/61 (BP Location: Right Arm)   Pulse 80   Temp 98.5 F (36.9 C) (Oral)   Resp 18   Ht _0  (1.676 m)   Wt 134 lb 7.7 oz (61 kg)   SpO2 94%   BMI 21.71 kg/m   Physical Exam awake/alert; rt  abd drains intact, dressings clean and dry, sites mildly tender, OP about 25 cc each blood tinged fluid  Imaging: CT ABDOMEN PELVIS W CONTRAST  Result Date: 06/01/2022 CLINICAL DATA:  Right lower quadrant pain for 3 days, history of Crohn's disease EXAM: CT ABDOMEN AND PELVIS WITH CONTRAST TECHNIQUE: Multidetector CT imaging of the abdomen and pelvis was performed using the standard protocol following bolus administration of intravenous contrast. RADIATION DOSE REDUCTION: This exam was performed according to the departmental dose-optimization program which includes automated exposure control, adjustment of the mA and/or kV according to patient size and/or use of  iterative reconstruction technique. CONTRAST:  167m OMNIPAQUE IOHEXOL 300 MG/ML  SOLN COMPARISON:  05/03/2022 FINDINGS: Lower chest: No acute abnormality. Hepatobiliary: No focal liver abnormality is seen. No gallstones, gallbladder wall thickening, or biliary dilatation. Pancreas: Unremarkable. No pancreatic ductal dilatation or surrounding inflammatory changes. Spleen: Normal in size without focal abnormality. Adrenals/Urinary Tract: Adrenal glands are within normal limits. Kidneys demonstrate a normal enhancement pattern bilaterally. The bladder is incompletely distended. There is however significant distension of the collecting systems and ureters bilaterally likely related to local compression by inflammatory changes in the pelvis. These changes are new from the prior exam. No calculi are identified. Stomach/Bowel: Mild fecal material is noted within the colon. The cecum and distal terminal ileum demonstrates significant inflammatory change much worse than that noted on the prior exam consistent with the known history of Crohn's. The appendix is within normal limits. The distal most aspect of the small bowel is mildly dilated related to the inflammatory changes. A few small air-fluid collections are identified 1 seen on image number 55 of series 2 measuring 2.2 cm and a second measuring 2.3 cm on image number 62 of series 2. These are highly suspicious for focal abscesses and new from the prior exam. An additional area measuring up to 3 cm best seen on image number 64 of series 2 and image number 43 of series 5 is also suspicious for abscess. More proximal small bowel is within normal limits. The stomach is unremarkable. Vascular/Lymphatic: No significant vascular findings are present. No enlarged abdominal or pelvic lymph nodes. Reproductive: Uterus and bilateral adnexa are unremarkable. Other: No hernia is noted.  Free fluid is noted within the pelvis. Musculoskeletal: No acute or significant osseous  findings. IMPRESSION: Significant progression of inflammatory change in the right lower quadrant when compared with the prior exam. Multiple small enhancing air-fluid collections are identified suspicious for abscesses and likely related to prior perforation. Free pelvic fluid is noted as well. These changes are consistent with the given clinical history of Crohn's and likely represent an acute flare with perforation and abscess formation. Bilateral hydronephrosis and hydroureter worse on the left than the right likely related to local compression related to the inflammatory changes. Electronically Signed   By: MInez CatalinaM.D.   On: 06/01/2022 20:19    Labs:  CBC: Recent Labs    06/01/22 1529 06/02/22 0645 06/02/22 0754 06/03/22 0512  WBC 25.2* 13.2* 11.2* 8.3  HGB 12.7 9.3* 8.8* 9.2*  HCT 38.9 28.3* 26.9* 28.6*  PLT 348 245 205 222    COAGS: Recent Labs    06/02/22 0754  INR 1.6*  APTT 37*    BMP: Recent Labs    06/01/22 1529 06/02/22 0544 06/02/22 0754 06/03/22 0512  NA 132* 134* 140 135  K 3.5 3.1* 3.4* 4.1  CL 96* 100 111 103  CO2 _0 GLUCOSE 185* 128* 119* 82  BUN _0 <5*  CALCIUM 8.9 8.0* 7.8* 8.2*  CREATININE 0.55 0.53 0.51 0.46  GFRNONAA >60 >60 >60 >60    LIVER FUNCTION TESTS: Recent Labs    05/03/22 1115 05/22/22 1220 06/01/22 1529 06/02/22 0754 06/03/22 0512  BILITOT 0.4 0.3 0.8 0.3  --   AST 9* 12* 8* 6*  --   ALT _1 --   ALKPHOS 57 51 62 43  --   PROT 7.2 6.7 6.6 4.4*  --   ALBUMIN 3.7 3.1* 3.1* 1.9* 2.3*    Assessment and Plan: Sandra Foster with hx Chron's disease, now with associated abd abscesses; s/p rt ant/lat drain placements 8/5; afebrile; WBC nl, hgb 9.2(8.8), creat nl; fluid cx pend  Drain Location: RLQ x2 Size: Fr size: 10 Fr Date of placement: 06/02/22  Currently to: Drain collection device: suction bulb 24 hour output:    Current examination: Flushes/aspirates easily.  Insertion site unremarkable. Sutures in  place. Dressed appropriately.   Plan: Continue TID flushes with 5 cc NS. Record output Q shift. Dressing changes QD or PRN if soiled.  Call IR APP or on call IR MD if difficulty flushing or sudden change in drain output.  Repeat imaging/possible drain injection once output < 10 mL/QD (excluding flush material). Consideration for drain removal if output is < 10 mL/QD (excluding flush material), pending discussion with the providing surgical service.  Discharge planning: Please contact IR APP or on call IR MD prior to patient d/c to ensure appropriate follow up plans are in place. Typically patient will follow up with IR clinic 10-14 days post d/c for repeat imaging/possible drain injection. IR scheduler will contact patient with date/time of appointment. Patient will need to flush drain QD with 5 cc NS, record output QD, dressing changes every 2-3 days or earlier if soiled.   IR will continue to follow - please call with questions or concerns.      Electronically Signed: D. Rowe Robert, PA-C 06/03/2022, 8:32 AM   I spent a total of 15 Minutes at the the patient's bedside AND on the patient's hospital floor or unit, greater than 50% of which was counseling/coordinating care for abdominal abscess drains    Patient ID: Sandra Foster, female   DOB: [birth year], 31 y.o.   MRN: [id number]

## 2022-06-03 NOTE — Progress Notes (Signed)
PROGRESS NOTE  Sandra Foster ZOX:[id number] DOB: 1991-09-14   PCP: Patient, No Pcp Per  Patient is from: Home.  DOA: 06/01/2022 LOS: 2  Chief complaints Chief Complaint  Patient presents with   Abdominal Pain     Brief Narrative / Interim history: 31 year old F with PMH of DM-2, chron's disease on Humira and recurrent hospitalization due to Crohn's flareup for which she has been treated with a steroid returning with new onset RLQ pain radiating to right flank and back as well as groin for 2 days, and admitted for sepsis secondary to multiple intra-abdominal abscesses with perforation likely related to Crohn's flare and bilateral hydroureteronephrosis, left > right.  Patient received CTX and Flagyl in ED, then switched to IV Zosyn by general surgery.  The next day, she has CT-guided drain placement x2 by IR.  Abscess culture pending.  Remains on IV Zosyn.  General surgery and GI following.   Subjective: Seen and examined earlier this morning.  No major events overnight of this morning.  Feels better.  Pain well controlled except for intermittent sharp and squeezing pain.  Seems to be tolerating clear liquid diet.  She denies nausea.   Objective: Vitals:   06/02/22 1725 06/02/22 2127 06/03/22 0139 06/03/22 0516  BP: (!) 105/55 98/60 (!) 92/53 97/61  Pulse: 95 89 71 80  Resp: 18 18 18 18   Temp:  98.5 F (36.9 C) 98.7 F (37.1 C) 98.5 F (36.9 C)  TempSrc:  Oral Oral Oral  SpO2: 98% 93% 99% 94%  Weight:      Height:        Examination:  GENERAL: No apparent distress.  Nontoxic. HEENT: MMM.  Vision and hearing grossly intact.  NECK: Supple.  No apparent JVD.  RESP:  No IWOB.  Fair aeration bilaterally. CVS:  RRR. Heart sounds normal.  ABD/GI/GU: BS+. Abd soft.  Tender to palpation.  Drains in place. MSK/EXT:  Moves extremities. No apparent deformity. No edema.  SKIN: no apparent skin lesion or wound NEURO: Awake and alert. Oriented appropriately.  No apparent focal neuro  deficit. PSYCH: Calm. Normal affect.   Procedures:  80/5-CT-guided abdominal abscess drain placement x2  Microbiology summarized: Blood cultures NGTD Abscess culture pending.  Assessment and plan: Principal Problem:   Crohn's disease of ileum, with abscess (HCC) Active Problems:   Insulin-requiring or dependent type II diabetes mellitus (HCC)   Tobacco abuse   Uncontrolled NIDDM-2 with hyperglycemia   Iron deficiency anemia   Acute Crohn's disease (HCC)   Gastroesophageal reflux disease   Sepsis (HCC)   Sepsis secondary to multiple intra-abdominal abscesses/perforation likely related to Crohn's flare-presents with abdominal pain for 2 days.  CT raised concern for acute Crohn's flare with perforation and multiple abscess formation.  CRP elevated to 24. -S/p drain placement for abscess x2 on 8/5. -Continue IV Zosyn pending abscess culture -Pain fairly controlled. -Clear liquid diet. -Mobilize -Follow adalimumab levels and antibodies -Appreciate help by general surgery and GI -GI holding off steroid in the setting of acute infection   Bilateral hydroureteronephrosis, left> right.  No AKI.  She reports hesitancy but no other UTI symptoms.  Likely from intra-abdominal abscesses. -Discussed with urology, Dr. 10/5 who recommends observation given normal renal function -May need renal Annabell Howells prior to discharge to reassess  Uncontrolled IDDM-2 in the setting of chronic steroid use for Crohn's: A1c 11% on 6/26. Recent Labs  Lab 06/02/22 2006 06/03/22 0004 06/03/22 0517 06/03/22 0719 06/03/22 1130  GLUCAP 100* 100* 86 90 151*  -  Continue SSI-moderate every 4 hours. -Recheck hemoglobin A1c  Normocytic anemia: Partly dilutional from IV fluid hydration. Recent Labs    04/23/22 0437 04/24/22 0423 04/25/22 0423 04/26/22 0421 05/03/22 1115 05/22/22 1220 06/01/22 1529 06/02/22 0645 06/02/22 0754 06/03/22 0512  HGB 10.1* 9.8* 9.9* 10.3* 13.4 11.3* 12.7 9.3* 8.8* 9.2*   -Monitor  Prior tobacco use: Reports quitting smoking.  Congratulated   Body mass index is 21.71 kg/m.           DVT prophylaxis:  enoxaparin (LOVENOX) injection 40 mg Start: 06/03/22 1000 SCDs Start: 06/02/22 0754  Code Status: Full code Family Communication: None at bedside Level of care: Med-Surg Status is: Inpatient Remains inpatient appropriate because: Sepsis due to intra-abdominal abscesses and perforation in the setting of Crohn's flare   Final disposition: Home once medically stable Consultants:  General surgery Gastroenterology Interventional radiology  Sch Meds:  Scheduled Meds:  enoxaparin (LOVENOX) injection  40 mg Subcutaneous Q24H   insulin aspart  0-15 Units Subcutaneous Q4H   lip balm   Topical BID   pantoprazole  40 mg Oral BID AC   sodium chloride flush  3 mL Intravenous Q12H   sodium chloride flush  5 mL Intracatheter Q8H   Continuous Infusions:  lactated ringers 1,000 mL with potassium chloride 20 mEq infusion 125 mL/hr at 06/03/22 1020   lactated ringers 999 mL/hr at 06/02/22 1241   methocarbamol (ROBAXIN) IV 1,000 mg (06/03/22 0531)   ondansetron (ZOFRAN) IV     piperacillin-tazobactam (ZOSYN)  IV 3.375 g (06/03/22 1021)   PRN Meds:.acetaminophen **OR** acetaminophen, alum & mag hydroxide-simeth, bisacodyl, diphenhydrAMINE, hydrALAZINE, HYDROmorphone (DILAUDID) injection, lactated ringers, magic mouthwash, menthol-cetylpyridinium, methocarbamol (ROBAXIN) IV, metoprolol tartrate, ondansetron (ZOFRAN) IV **OR** ondansetron (ZOFRAN) IV, ondansetron **OR** [DISCONTINUED] ondansetron (ZOFRAN) IV, oxyCODONE, phenol, prochlorperazine, senna-docusate, traZODone  Antimicrobials: Anti-infectives (From admission, onward)    Start     Dose/Rate Route Frequency Ordered Stop   06/02/22 2200  cefTRIAXone (ROCEPHIN) 2 g in sodium chloride 0.9 % 100 mL IVPB  Status:  Discontinued        2 g 200 mL/hr over 30 Minutes Intravenous Every 24 hours 06/02/22  0753 06/02/22 0820   06/02/22 0753  metroNIDAZOLE (FLAGYL) IVPB 500 mg  Status:  Discontinued        500 mg 100 mL/hr over 60 Minutes Intravenous Every 12 hours 06/02/22 0753 06/02/22 0820   06/02/22 0300  piperacillin-tazobactam (ZOSYN) IVPB 3.375 g        3.375 g 12.5 mL/hr over 240 Minutes Intravenous Every 8 hours 06/01/22 2209 06/07/22 0259   06/01/22 1900  cefTRIAXone (ROCEPHIN) 2 g in sodium chloride 0.9 % 100 mL IVPB        2 g 200 mL/hr over 30 Minutes Intravenous  Once 06/01/22 1855 06/01/22 1950   06/01/22 1900  metroNIDAZOLE (FLAGYL) IVPB 500 mg        500 mg 100 mL/hr over 60 Minutes Intravenous  Once 06/01/22 1855 06/01/22 2033        I have personally reviewed the following labs and images: CBC: Recent Labs  Lab 06/01/22 1529 06/02/22 0645 06/02/22 0754 06/03/22 0512  WBC 25.2* 13.2* 11.2* 8.3  HGB 12.7 9.3* 8.8* 9.2*  HCT 38.9 28.3* 26.9* 28.6*  MCV 84.4 85.0 85.7 87.5  PLT 348 245 205 222   BMP &GFR Recent Labs  Lab 06/01/22 1529 06/02/22 0544 06/02/22 0754 06/03/22 0512  NA 132* 134* 140 135  K 3.5 3.1* 3.4* 4.1  CL 96* 100 111  103  CO2 25 26 25 24   GLUCOSE 185* 128* 119* 82  BUN 9 8 7  <5*  CREATININE 0.55 0.53 0.51 0.46  CALCIUM 8.9 8.0* 7.8* 8.2*  MG  --   --  1.8 1.9  PHOS  --   --  2.8 3.7   Estimated Creatinine Clearance: 96.3 mL/min (by C-G formula based on SCr of 0.46 mg/dL). Liver & Pancreas: Recent Labs  Lab 06/01/22 1529 06/02/22 0754 06/03/22 0512  AST 8* 6*  --   ALT 16 10  --   ALKPHOS 62 43  --   BILITOT 0.8 0.3  --   PROT 6.6 4.4*  --   ALBUMIN 3.1* 1.9* 2.3*   Recent Labs  Lab 06/01/22 1529  LIPASE 26   No results for input(s): "AMMONIA" in the last 168 hours. Diabetic: No results for input(s): "HGBA1C" in the last 72 hours. Recent Labs  Lab 06/02/22 2006 06/03/22 0004 06/03/22 0517 06/03/22 0719 06/03/22 1130  GLUCAP 100* 100* 86 90 151*   Cardiac Enzymes: No results for input(s): "CKTOTAL", "CKMB",  "CKMBINDEX", "TROPONINI" in the last 168 hours. No results for input(s): "PROBNP" in the last 8760 hours. Coagulation Profile: Recent Labs  Lab 06/02/22 0754  INR 1.6*   Thyroid Function Tests: No results for input(s): "TSH", "T4TOTAL", "FREET4", "T3FREE", "THYROIDAB" in the last 72 hours. Lipid Profile: No results for input(s): "CHOL", "HDL", "LDLCALC", "TRIG", "CHOLHDL", "LDLDIRECT" in the last 72 hours. Anemia Panel: No results for input(s): "VITAMINB12", "FOLATE", "FERRITIN", "TIBC", "IRON", "RETICCTPCT" in the last 72 hours. Urine analysis:    Component Value Date/Time   COLORURINE YELLOW 06/01/2022 1905   APPEARANCEUR HAZY (A) 06/01/2022 1905   LABSPEC 1.011 06/01/2022 1905   PHURINE 6.0 06/01/2022 1905   GLUCOSEU NEGATIVE 06/01/2022 1905   HGBUR MODERATE (A) 06/01/2022 1905   BILIRUBINUR NEGATIVE 06/01/2022 1905   KETONESUR 20 (A) 06/01/2022 1905   PROTEINUR NEGATIVE 06/01/2022 1905   UROBILINOGEN 0.2 08/11/2015 2134   NITRITE NEGATIVE 06/01/2022 1905   LEUKOCYTESUR NEGATIVE 06/01/2022 1905   Sepsis Labs: Invalid input(s): "PROCALCITONIN", "LACTICIDVEN"  Microbiology: Recent Results (from the past 240 hour(s))  Culture, blood (single)     Status: None (Preliminary result)   Collection Time: 06/01/22  7:26 PM   Specimen: BLOOD  Result Value Ref Range Status   Specimen Description   Final    BLOOD SITE NOT SPECIFIED Performed at [facility], [address] [place], [place]    Special Requests   Final    BOTTLES DRAWN AEROBIC AND ANAEROBIC Blood Culture adequate volume Performed at [facility], [address], [place], [place]    Culture   Final    NO GROWTH 2 DAYS Performed at [facility], [address] [place], [address] [place]    Report Status PENDING  Incomplete  Aerobic/Anaerobic Culture w Gram Stain (surgical/deep wound)     Status: None (Preliminary result)   Collection Time:  06/02/22  5:10 PM   Specimen: Abdomen; Abscess  Result Value Ref Range Status   Specimen Description   Final    ABDOMEN Performed at [facility], [address], [place], [place]    Special Requests   Final    NONE Performed at [facility], [address], [place], [place]    Gram Stain   Final    ABUNDANT WBC PRESENT,BOTH PMN AND MONONUCLEAR FEW GRAM POSITIVE COCCI IN PAIRS  AND CHAINS    Culture   Final    TOO YOUNG TO READ Performed at [facility], [address], [place], [place] [address]    Report Status PENDING  Incomplete    Radiology Studies: No results found.    Jamerson Vonbargen T. Kawhi Die[facility]  If 7PM-7AM, please contact night-coverage [email or web address] 06/03/2022, 12:48 PM

## 2022-06-04 DIAGNOSIS — K219 Gastro-esophageal reflux disease without esophagitis: Secondary | ICD-10-CM

## 2022-06-04 LAB — COMPREHENSIVE METABOLIC PANEL
ALT: 11 U/L (ref 0–44)
AST: 9 U/L — ABNORMAL LOW (ref 15–41)
Albumin: 2.4 g/dL — ABNORMAL LOW (ref 3.5–5.0)
Alkaline Phosphatase: 58 U/L (ref 38–126)
Anion gap: 7 (ref 5–15)
BUN: <5 mg/dL — ABNORMAL LOW (ref 6–20)
CO2: 25 mmol/L (ref 22–32)
Calcium: 8.6 mg/dL — ABNORMAL LOW (ref 8.9–10.3)
Chloride: 108 mmol/L (ref 98–111)
Creatinine, Ser: 0.52 mg/dL (ref 0.44–1.00)
GFR, Estimated: >60 mL/min (ref >60)
Glucose, Bld: 121 mg/dL — ABNORMAL HIGH (ref 70–99)
Potassium: 3.9 mmol/L (ref 3.5–5.1)
Sodium: 140 mmol/L (ref 135–145)
Total Bilirubin: 0.5 mg/dL (ref 0.3–1.2)
Total Protein: 5.7 g/dL — ABNORMAL LOW (ref 6.5–8.1)

## 2022-06-04 LAB — PHOSPHORUS: Phosphorus: 3.5 mg/dL (ref 2.5–4.6)

## 2022-06-04 LAB — CBC
HCT: 29.4 % — ABNORMAL LOW (ref 36.0–46.0)
Hemoglobin: 9.5 g/dL — ABNORMAL LOW (ref 12.0–15.0)
MCH: 27.5 pg (ref 26.0–34.0)
MCHC: 32.3 g/dL (ref 30.0–36.0)
MCV: 85.2 fL (ref 80.0–100.0)
Platelets: 259 10*3/uL (ref 150–400)
RBC: 3.45 MIL/uL — ABNORMAL LOW (ref 3.87–5.11)
RDW: 15 % (ref 11.5–15.5)
WBC: 8.1 10*3/uL (ref 4.0–10.5)
nRBC: 0 % (ref 0.0–0.2)

## 2022-06-04 LAB — GLUCOSE, CAPILLARY
Glucose-Capillary: 105 mg/dL — ABNORMAL HIGH (ref 70–99)
Glucose-Capillary: 109 mg/dL — ABNORMAL HIGH (ref 70–99)
Glucose-Capillary: 111 mg/dL — ABNORMAL HIGH (ref 70–99)
Glucose-Capillary: 113 mg/dL — ABNORMAL HIGH (ref 70–99)
Glucose-Capillary: 125 mg/dL — ABNORMAL HIGH (ref 70–99)

## 2022-06-04 LAB — MAGNESIUM: Magnesium: 1.9 mg/dL (ref 1.7–2.4)

## 2022-06-04 NOTE — TOC Initial Note (Signed)
Transition of Care [facility]) - Initial/Assessment Note    Patient Details  Name: Sandra Foster MRN: [id number] Date of Birth: [birth year]  [facility] CM/SW Contact:    Lennart Pall, LCSW Phone Number: 06/04/2022, 11:34 AM  Clinical Narrative:                 Met with pt to review possible dc needs and PCP follow up.  No PCP or insurance listed on demographics.  Pt reports that she is still active with [facility] and has secured PCP at [facility] with Jorge Ny, PA who she has seen x 2 already.  Have updated PCP information in chart.  Financial counseling dept will need to update/ verify insurance.  Pt with questions about applying for disability and have referred her to the [email or web address] website.  No further TOC needs at this time but will be available as needed while here.  Expected Discharge Plan: Home/Self Care Barriers to Discharge: Continued Medical Work up   Patient Goals and CMS Choice Patient states their goals for this hospitalization and ongoing recovery are:: return home      Expected Discharge Plan and Services Expected Discharge Plan: Home/Self Care In-house Referral: Clinical Social Work     Living arrangements for the past 2 months: Single Family Home                 DME Arranged: N/A DME Agency: NA                  Prior Living Arrangements/Services Living arrangements for the past 2 months: Single Family Home Lives with:: Self Patient language and need for interpreter reviewed:: Yes Do you feel safe going back to the place where you live?: Yes      Need for Family Participation in Patient Care: No (Comment) Care giver support system in place?: No (comment)   Criminal Activity/Legal Involvement Pertinent to Current Situation/Hospitalization: No - Comment as needed  Activities of Daily Living Home Assistive Devices/Equipment: CBG Meter ADL Screening (condition at time of admission) Patient's cognitive ability adequate to safely complete  daily activities?: Yes Is the patient deaf or have difficulty hearing?: No Does the patient have difficulty seeing, even when wearing glasses/contacts?: No Does the patient have difficulty concentrating, remembering, or making decisions?: No Patient able to express need for assistance with ADLs?: No Does the patient have difficulty dressing or bathing?: No Independently performs ADLs?: Yes (appropriate for developmental age) Does the patient have difficulty walking or climbing stairs?: No Weakness of Legs: Both Weakness of Arms/Hands: None  Permission Sought/Granted Permission sought to share information with : Family Supports                Emotional Assessment Appearance:: Appears stated age Attitude/Demeanor/Rapport: Gracious Affect (typically observed): Accepting Orientation: : Oriented to Place, Oriented to Self, Oriented to  Time, Oriented to Situation Alcohol / Substance Use: Not Applicable Psych Involvement: No (comment)  Admission diagnosis:  Intra-abdominal abscess ([place]) [K65.1] History of Crohn's disease [Z87.19] Right lower quadrant abdominal pain [R10.31] Sepsis ([place]) [A41.9] Hydronephrosis, unspecified hydronephrosis type [N13.30] Sepsis, due to unspecified organism, unspecified whether acute organ dysfunction present [facility]) [A41.9] Patient Active Problem List   Diagnosis Date Noted   Sepsis ([place]) 06/01/2022   Abnormal weight loss 05/28/2022   Gastroesophageal reflux disease 05/28/2022   Acute Crohn's disease ([place]) 05/03/2022   Iron deficiency anemia 04/24/2022   Normocytic anemia 04/23/2022   Hypotension 04/23/2022   Uncontrolled NIDDM-2 with hyperglycemia 04/23/2022  Crohn's disease of ileum, with abscess ([place]) 04/22/2022   Tobacco abuse 04/22/2022   Dehydration 01/09/2022   Fistula of intestine, 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   Abdominal pain, acute, right lower quadrant 01/08/2022   Hypokalemia 01/08/2022   Diarrhea 01/08/2022   NVD (normal  vaginal delivery) 09/13/2015   Diabetes in undelivered pregnancy 08/30/2015   Insulin-requiring or dependent type II diabetes mellitus ([place]) 08/29/2015   [redacted] weeks gestation of pregnancy    Prior pregnancy with fetal demise and current pregnancy in second trimester    Encounter for fetal anatomic survey    PCP:  Jorge Ny, PA-C Pharmacy:   [facility] [address] [place] [address] Phone: [phone] Fax: [phone]  [id number] [place], [place] [address] Phone: [phone] Fax: [address], [place] [address] Phone: [phone] Fax: [phone]     Social Determinants of Health (SDOH) Interventions    Readmission Risk Interventions    06/04/2022   11:28 AM 04/23/2022    2:36 PM  Readmission Risk Prevention Plan  Transportation Screening Complete Complete  PCP or Specialist Appt within 5-7 Days  Complete  Home Care Screening  Complete  Medication Review (RN CM)  Complete  Medication Review (RN Care Manager) Complete   PCP or Specialist appointment within 3-5 days of discharge Complete   HRI or [place] Complete   SW Recovery Care/Counseling Consult Complete   [place] Not Applicable

## 2022-06-04 NOTE — Progress Notes (Signed)
Subjective: Feeling much better since being admitted and drains placed.  Tolerating CLD with no issues.  ROS: See above, otherwise other systems negative  Objective: Vital signs in last 24 hours: Temp:  [98.3 F (36.8 C)-98.7 F (37.1 C)] 98.7 F (37.1 C) (08/07 0531) Pulse Rate:  [80-87] 87 (08/07 0531) Resp:  [18-21] 18 (08/07 0531) BP: (100-106)/(60-66) 100/63 (08/07 0531) SpO2:  [99 %-100 %] 100 % (08/07 0531) Last BM Date : 04/24/22  Intake/Output from previous day: 08/06 0701 - 08/07 0700 In: 4769 [P.O.:1200; I.V.:2872.8; IV Piggyback:696.3] Out: 760 [Urine:750; Drains:10] Intake/Output this shift: No intake/output data recorded.  PE: Abd: soft, tender around her drains, but abdominal pain otherwise much less tender according to the patient.  +BS, both JPs with serousang output.  ND  Lab Results:  Recent Labs    06/03/22 0512 06/04/22 0031  WBC 8.3 8.1  HGB 9.2* 9.5*  HCT 28.6* 29.4*  PLT 222 259   BMET Recent Labs    06/03/22 0512 06/04/22 0031  NA 135 140  K 4.1 3.9  CL 103 108  CO2 24 25  GLUCOSE 82 121*  BUN <5* <5*  CREATININE 0.46 0.52  CALCIUM 8.2* 8.6*   PT/INR Recent Labs    06/02/22 0754  LABPROT 18.4*  INR 1.6*   CMP     Component Value Date/Time   NA 140 06/04/2022 0031   K 3.9 06/04/2022 0031   CL 108 06/04/2022 0031   CO2 25 06/04/2022 0031   GLUCOSE 121 (H) 06/04/2022 0031   BUN <5 (L) 06/04/2022 0031   CREATININE 0.52 06/04/2022 0031   CALCIUM 8.6 (L) 06/04/2022 0031   PROT 5.7 (L) 06/04/2022 0031   ALBUMIN 2.4 (L) 06/04/2022 0031   AST 9 (L) 06/04/2022 0031   ALT 11 06/04/2022 0031   ALKPHOS 58 06/04/2022 0031   BILITOT 0.5 06/04/2022 0031   GFRNONAA >60 06/04/2022 0031   GFRAA >60 08/28/2015 2150   Lipase     Component Value Date/Time   LIPASE 26 06/01/2022 1529       Studies/Results: CT GUIDED PERITONEAL/RETROPERITONEAL FLUID DRAIN BY PERC CATH  Result Date: 06/03/2022 INDICATION: 31 year old  female history of Crohn's disease presenting with abdominal pain and sepsis with CT findings concerning for intra-abdominal abscess associated with the terminal ileum. EXAM: 1. CT PERC DRAIN PERITONEAL ABSCESS 2. CT PERC DRAIN PERITONEAL ABSCESS COMPARISON:  None Available. MEDICATIONS: The patient is currently admitted to the hospital and receiving intravenous antibiotics. The antibiotics were administered within an appropriate time frame prior to the initiation of the procedure. 4 mg Zofran, intravenous ANESTHESIA/SEDATION: Moderate (conscious) sedation was employed during this procedure. A total of Versed 2 mg and Fentanyl 100 mcg was administered intravenously. Moderate Sedation Time: 51 minutes. The patient's level of consciousness and vital signs were monitored continuously by radiology nursing throughout the procedure under my direct supervision. CONTRAST:  None COMPLICATIONS: None immediate. PROCEDURE: RADIATION DOSE REDUCTION: This exam was performed according to the departmental dose-optimization program which includes automated exposure control, adjustment of the mA and/or kV according to patient size and/or use of iterative reconstruction technique. Informed written consent was obtained from the patient after a discussion of the risks, benefits and alternatives to treatment. The patient was placed supine on the CT gantry and a pre procedural CT was performed re-demonstrating the known abscess/fluid collections within the location. The procedure was planned. A timeout was performed prior to the initiation of the procedure. The right  lower quadrant was prepped and draped in the usual sterile fashion. The overlying soft tissues were anesthetized with 1% lidocaine with epinephrine at the planned needle entry sites. Appropriate trajectories were planned with the use of a 22 gauge spinal needles. Two separate 18 gauge trocar needle were advanced into the abscess/fluid collections and short Amplatz super stiff  wires wer coiled within the collections. Appropriate positioning was confirmed with a limited CT scan. The tracts were serially dilated allowing placement of 2, 10 French all-purpose drainage catheters. Appropriate positioning was confirmed with a limited postprocedural CT scan. Approximately 5 mL ml of purulent fluid was aspirated from the right inferior and lateral drain. Approximately 2 mL of purulence and serous fluid was aspirated from the more anterior and superior drain. The tubes were connected to a bulb suction and sutured in place. A dressing was placed. The patient tolerated the procedure well without immediate post procedural complication. IMPRESSION: Successful CT guided placement of 2, 10 French all purpose drain catheters into the right lower quadrant fluid collections with aspiration of small volume purulent fluid. Samples were sent to the laboratory as requested by the ordering clinical team. Marliss Coots, MD [facility] Electronically Signed   By: Marliss Coots M.D.   On: 06/03/2022 18:52   CT GUIDED PERITONEAL/RETROPERITONEAL FLUID DRAIN BY PERC CATH  Result Date: 06/03/2022 INDICATION: 31 year old female history of Crohn's disease presenting with abdominal pain and sepsis with CT findings concerning for intra-abdominal abscess associated with the terminal ileum. EXAM: 1. CT PERC DRAIN PERITONEAL ABSCESS 2. CT PERC DRAIN PERITONEAL ABSCESS COMPARISON:  None Available. MEDICATIONS: The patient is currently admitted to the hospital and receiving intravenous antibiotics. The antibiotics were administered within an appropriate time frame prior to the initiation of the procedure. 4 mg Zofran, intravenous ANESTHESIA/SEDATION: Moderate (conscious) sedation was employed during this procedure. A total of Versed 2 mg and Fentanyl 100 mcg was administered intravenously. Moderate Sedation Time: 51 minutes. The patient's level of consciousness and  vital signs were monitored continuously by radiology nursing throughout the procedure under my direct supervision. CONTRAST:  None COMPLICATIONS: None immediate. PROCEDURE: RADIATION DOSE REDUCTION: This exam was performed according to the departmental dose-optimization program which includes automated exposure control, adjustment of the mA and/or kV according to patient size and/or use of iterative reconstruction technique. Informed written consent was obtained from the patient after a discussion of the risks, benefits and alternatives to treatment. The patient was placed supine on the CT gantry and a pre procedural CT was performed re-demonstrating the known abscess/fluid collections within the location. The procedure was planned. A timeout was performed prior to the initiation of the procedure. The right lower quadrant was prepped and draped in the usual sterile fashion. The overlying soft tissues were anesthetized with 1% lidocaine with epinephrine at the planned needle entry sites. Appropriate trajectories were planned with the use of a 22 gauge spinal needles. Two separate 18 gauge trocar needle were advanced into the abscess/fluid collections and short Amplatz super stiff wires wer coiled within the collections. Appropriate positioning was confirmed with a limited CT scan. The tracts were serially dilated allowing placement of 2, 10 French all-purpose drainage catheters. Appropriate positioning was confirmed with a limited postprocedural CT scan. Approximately 5 mL ml of purulent fluid was aspirated from the right inferior and lateral drain. Approximately 2 mL of purulence and serous fluid was aspirated from the more anterior and superior drain. The tubes were connected to a bulb suction and  sutured in place. A dressing was placed. The patient tolerated the procedure well without immediate post procedural complication. IMPRESSION: Successful CT guided placement of 2, 10 French all purpose drain catheters into  the right lower quadrant fluid collections with aspiration of small volume purulent fluid. Samples were sent to the laboratory as requested by the ordering clinical team. Marliss Coots, MD [facility] Radiology Electronically Signed   By: Marliss Coots M.D.   On: 06/03/2022 18:52    Anti-infectives: Anti-infectives (From admission, onward)    Start     Dose/Rate Route Frequency Ordered Stop   06/02/22 2200  cefTRIAXone (ROCEPHIN) 2 g in sodium chloride 0.9 % 100 mL IVPB  Status:  Discontinued        2 g 200 mL/hr over 30 Minutes Intravenous Every 24 hours 06/02/22 0753 06/02/22 0820   06/02/22 0753  metroNIDAZOLE (FLAGYL) IVPB 500 mg  Status:  Discontinued        500 mg 100 mL/hr over 60 Minutes Intravenous Every 12 hours 06/02/22 0753 06/02/22 0820   06/02/22 0300  piperacillin-tazobactam (ZOSYN) IVPB 3.375 g        3.375 g 12.5 mL/hr over 240 Minutes Intravenous Every 8 hours 06/01/22 2209 06/07/22 0259   06/01/22 1900  cefTRIAXone (ROCEPHIN) 2 g in sodium chloride 0.9 % 100 mL IVPB        2 g 200 mL/hr over 30 Minutes Intravenous  Once 06/01/22 1855 06/01/22 1950   06/01/22 1900  metroNIDAZOLE (FLAGYL) IVPB 500 mg        500 mg 100 mL/hr over 60 Minutes Intravenous  Once 06/01/22 1855 06/01/22 2033        Assessment/Plan Crohn's disease with abscess -patient seems to be improving -cont abx therapy, cx with multiple species, gram + cocci -cont both JP drains for now -adv to FLD -further crohn's management per GI  FEN - FLD/IVFs per primary VTE - lovenox ID - zosyn  I reviewed Consultant GI notes, hospitalist notes, last 24 h vitals and pain scores, last 48 h intake and output, last 24 h labs and trends, and last 24 h imaging results.   LOS: 3 days    Letha Cape , [facility] 06/04/2022, 9:16 AM Please see Amion for pager number during day hours 7:00am-4:30pm or 7:00am -11:30am on weekends

## 2022-06-04 NOTE — Progress Notes (Signed)
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 She complains of diffuse abdominal pain and the fact that she requires more pain medication for symptomatic relief. She has not had a bowel movement since Wednesday last week. She denies having any nausea or vomiting.  She is tolerating a clear diet well. Objective: Vital signs in last 24 hours: Temp:  [98.3 F (36.8 C)-98.7 F (37.1 C)] 98.7 F (37.1 C) (08/07 0531) Pulse Rate:  [80-87] 87 (08/07 0531) Resp:  [18-21] 18 (08/07 0531) BP: (100-106)/(60-66) 100/63 (08/07 0531) SpO2:  [99 %-100 %] 100 % (08/07 0531) Last BM Date : 04/24/22  Intake/Output from previous day: 08/06 0701 - 08/07 0700 In: 4769 [P.O.:1200; I.V.:2872.8; IV Piggyback:696.3] Out: 760 [Urine:750; Drains:10] Intake/Output this shift: Total I/O In: 368.8 [I.V.:363.6; IV Piggyback:5.2] Out: -   General appearance: alert, cooperative, appears stated age, and no distress Resp: clear to auscultation bilaterally Cardio: regular rate and rhythm, S1, S2 normal, no murmur, click, rub or gallop GI: soft, non-tender; hypoactive bowel sounds  no masses, no organomegaly; a couple of drains in place Lab Results: Recent Labs    06/02/22 0754 06/03/22 0512 06/04/22 0031  WBC 11.2* 8.3 8.1  HGB 8.8* 9.2* 9.5*  HCT 26.9* 28.6* 29.4*  PLT 205 222 259   BMET Recent Labs    06/02/22 0754 06/03/22 0512 06/04/22 0031  NA 140 135 140  K 3.4* 4.1 3.9  CL 111 103 108  CO2 25 24 25   GLUCOSE 119* 82 121*   BUN 7 <5* <5*  CREATININE 0.51 0.46 0.52  CALCIUM 7.8* 8.2* 8.6*   LFT Recent Labs    06/04/22 0031  PROT 5.7*  ALBUMIN 2.4*  AST 9*  ALT 11  ALKPHOS 58  BILITOT 0.5   PT/INR Recent Labs    06/02/22 0754  LABPROT 18.4*  INR 1.6*   Studies/Results: CT GUIDED PERITONEAL/RETROPERITONEAL FLUID DRAIN BY PERC CATH  Result Date: 06/03/2022 INDICATION: 31 year old female history of Crohn's disease presenting with abdominal pain and sepsis with CT findings concerning for intra-abdominal abscess associated with the terminal ileum. EXAM: 1. CT PERC DRAIN PERITONEAL ABSCESS 2. CT PERC DRAIN PERITONEAL ABSCESS COMPARISON:  None Available. MEDICATIONS: The patient is currently admitted to the hospital and receiving intravenous antibiotics. The antibiotics were administered within an appropriate time frame prior to the initiation of the procedure. 4 mg Zofran, intravenous ANESTHESIA/SEDATION: Moderate (conscious) sedation was employed during this procedure. A total of Versed 2 mg and Fentanyl 100 mcg was administered intravenously. Moderate Sedation Time: 51 minutes. The patient's level of consciousness and vital signs were monitored continuously by radiology nursing throughout the procedure under my direct supervision. CONTRAST:  None COMPLICATIONS: None immediate. PROCEDURE: RADIATION DOSE REDUCTION: This exam was performed according to the departmental dose-optimization program which includes automated exposure control, adjustment of the mA and/or kV according to patient size and/or use of iterative reconstruction technique. Informed written consent was obtained from the patient after a discussion of the risks, benefits and alternatives to treatment. The patient was placed supine on the CT  gantry and a pre procedural CT was performed re-demonstrating the known abscess/fluid collections within the location. The procedure was planned. A timeout was performed prior to the initiation of the procedure. The  right lower quadrant was prepped and draped in the usual sterile fashion. The overlying soft tissues were anesthetized with 1% lidocaine with epinephrine at the planned needle entry sites. Appropriate trajectories were planned with the use of a 22 gauge spinal needles. Two separate 18 gauge trocar needle were advanced into the abscess/fluid collections and short Amplatz super stiff wires wer coiled within the collections. Appropriate positioning was confirmed with a limited CT scan. The tracts were serially dilated allowing placement of 2, 10 French all-purpose drainage catheters. Appropriate positioning was confirmed with a limited postprocedural CT scan. Approximately 5 mL ml of purulent fluid was aspirated from the right inferior and lateral drain. Approximately 2 mL of purulence and serous fluid was aspirated from the more anterior and superior drain. The tubes were connected to a bulb suction and sutured in place. A dressing was placed. The patient tolerated the procedure well without immediate post procedural complication. IMPRESSION: Successful CT guided placement of 2, 10 French all purpose drain catheters into the right lower quadrant fluid collections with aspiration of small volume purulent fluid. Samples were sent to the laboratory as requested by the ordering clinical team. Marliss Coots, MD [facility] Radiology Electronically Signed   By: Marliss Coots M.D.   On: 06/03/2022 18:52   CT GUIDED PERITONEAL/RETROPERITONEAL FLUID DRAIN BY PERC CATH  Result Date: 06/03/2022 INDICATION: 31 year old female history of Crohn's disease presenting with abdominal pain and sepsis with CT findings concerning for intra-abdominal abscess associated with the terminal ileum. EXAM: 1. CT PERC DRAIN PERITONEAL ABSCESS 2. CT PERC DRAIN PERITONEAL ABSCESS COMPARISON:  None Available. MEDICATIONS: The patient is currently admitted to the hospital and receiving intravenous  antibiotics. The antibiotics were administered within an appropriate time frame prior to the initiation of the procedure. 4 mg Zofran, intravenous ANESTHESIA/SEDATION: Moderate (conscious) sedation was employed during this procedure. A total of Versed 2 mg and Fentanyl 100 mcg was administered intravenously. Moderate Sedation Time: 51 minutes. The patient's level of consciousness and vital signs were monitored continuously by radiology nursing throughout the procedure under my direct supervision. CONTRAST:  None COMPLICATIONS: None immediate. PROCEDURE: RADIATION DOSE REDUCTION: This exam was performed according to the departmental dose-optimization program which includes automated exposure control, adjustment of the mA and/or kV according to patient size and/or use of iterative reconstruction technique. Informed written consent was obtained from the patient after a discussion of the risks, benefits and alternatives to treatment. The patient was placed supine on the CT gantry and a pre procedural CT was performed re-demonstrating the known abscess/fluid collections within the location. The procedure was planned. A timeout was performed prior to the initiation of the procedure. The right lower quadrant was prepped and draped in the usual sterile fashion. The overlying soft tissues were anesthetized with 1% lidocaine with epinephrine at the planned needle entry sites. Appropriate trajectories were planned with the use of a 22 gauge spinal needles. Two separate 18 gauge trocar needle were advanced into the abscess/fluid collections and short Amplatz super stiff wires wer coiled within the collections. Appropriate positioning was confirmed with a limited CT scan. The tracts were serially dilated allowing placement of 2, 10 French all-purpose drainage catheters. Appropriate positioning was confirmed with a limited postprocedural CT scan. Approximately 5 mL ml of purulent fluid was aspirated  from the right inferior and  lateral drain. Approximately 2 mL of purulence and serous fluid was aspirated from the more anterior and superior drain. The tubes were connected to a bulb suction and sutured in place. A dressing was placed. The patient tolerated the procedure well without immediate post procedural complication. IMPRESSION: Successful CT guided placement of 2, 10 French all purpose drain catheters into the right lower quadrant fluid collections with aspiration of small volume purulent fluid. Samples were sent to the laboratory as requested by the ordering clinical team. Marliss Coots, MD [facility] Radiology Electronically Signed   By: Marliss Coots M.D.   On: 06/03/2022 18:52    Medications: I have reviewed the patient's current medications.  Assessment/Plan: 1) Sepsis secondary to multiple intra-abdominal abscesses/perforation possibly related to a Crohn's flare.  She is on IV Zosyn. 2) Crohn's ileitis on Humira. 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   LOS: 3 days   Charna Elizabeth 06/04/2022, 10:03 AM

## 2022-06-04 NOTE — Progress Notes (Signed)
Referring Physician(s): Gross,S  Supervising Physician: Michaelle Birks  Patient Status:  [facility] - In-pt  Chief Complaint:  Abdominal pain/abscesses  Subjective: Pt doing ok today; denies worsening abd pain, N/V no BM today; does note some burning with flushing of ant /mid abd drain   Allergies: Nsaids  Medications: Prior to Admission medications   Medication Sig Start Date End Date Taking? Authorizing Provider  acetaminophen (TYLENOL) 500 MG tablet Take 1 tablet (500 mg total) by mouth every 6 (six) hours as needed. 05/22/22  Yes Nanavati, Ankit, MD  Adalimumab (HUMIRA PEN) 40 MG/0.4ML PNKT Inject 40 mg into the skin every 14 (fourteen) days. Every other Tuesday   Yes [provider]  carboxymethylcellulose (REFRESH PLUS) 0.5 % SOLN Place 1 drop into both eyes daily as needed (dry eyes).   Yes [provider]  etonogestrel (NEXPLANON) 68 MG IMPL implant 1 each by Subdermal route once.   Yes [provider]  insulin glargine (LANTUS) 100 UNIT/ML Solostar Pen Inject 15 Units into the skin 2 (two) times daily. Patient taking differently: Inject 9 Units into the skin daily. 04/26/22  Yes Mercy Riding, MD  morphine (MSIR) 30 MG tablet Take 1 tablet (30 mg total) by mouth every 12 (twelve) hours as needed for up to 9 doses for severe pain. 05/22/22  Yes Varney Biles, MD  blood glucose meter kit and supplies Dispense based on patient and insurance preference. Use up to four times daily as directed. (FOR ICD-10 E10.9, E11.9). 04/26/22   Mercy Riding, MD  insulin aspart (NOVOLOG) 100 UNIT/ML FlexPen CBG < 70: Implement Hypoglycemia Standing Orders and refer to Hypoglycemia Standing Orders sidebar report CBG 70 - 120: 0 units CBG 121 - 150: 2 units CBG 151 - 200: 3 units CBG 201 - 250: 5 units CBG 251 - 300: 8 units CBG 301 - 350: 11 units CBG 351 - 400: 15 units CBG > 400: call MD and obtain STAT lab verification Patient not taking: Reported on 06/01/2022 04/26/22    Mercy Riding, MD  Insulin Pen Needle (PEN NEEDLES) 30G X 5 MM MISC 1 Pen by Does not apply route 4 (four) times daily -  before meals and at bedtime. 04/26/22   Mercy Riding, MD  nicotine (NICODERM CQ - DOSED IN MG/24 HOURS) 14 mg/24hr patch Place 1 patch (14 mg total) onto the skin daily. Patient not taking: Reported on 06/01/2022 04/26/22   Mercy Riding, MD  oxyCODONE (OXY IR/ROXICODONE) 5 MG immediate release tablet Take 1-2 tablets (5-10 mg total) by mouth every 4 (four) hours as needed for severe pain. Patient not taking: Reported on 06/01/2022 05/04/22   Cherene Altes, MD  pantoprazole (PROTONIX) 40 MG tablet Take 1 tablet (40 mg total) by mouth daily. Patient not taking: Reported on 06/01/2022 04/26/22 05/26/22  Mercy Riding, MD  predniSONE (DELTASONE) 10 MG tablet Take 5 tablets (50 mg total) by mouth daily. Patient not taking: Reported on 06/01/2022 05/22/22   Varney Biles, MD  amitriptyline (ELAVIL) 25 MG tablet Take 1-2 tablets (25-50 mg total) by mouth at bedtime as needed (headache). 03/25/20 06/18/20  Gregor Hams, MD     Vital Signs: BP 100/63 (BP Location: Right Arm)   Pulse 87   Temp 98.7 F (37.1 C) (Oral)   Resp 18   Ht _0  (1.676 m)   Wt 134 lb 7.7 oz (61 kg)   SpO2 100%   BMI 21.71 kg/m  Physical Exam awake/alert; abd drains intact, insertions sites ok, OP minimal amount blood-tinged fluid, rt lat drain flushed without difficulty with return of equal flush amount; rt mid abd drain flushed gently but pt has burning with flush, no sig fluid return  Imaging: CT GUIDED PERITONEAL/RETROPERITONEAL FLUID DRAIN BY PERC CATH  Result Date: 06/03/2022 INDICATION: 31 year old female history of Crohn's disease presenting with abdominal pain and sepsis with CT findings concerning for intra-abdominal abscess associated with the terminal ileum. EXAM: 1. CT PERC DRAIN PERITONEAL ABSCESS 2. CT PERC DRAIN PERITONEAL ABSCESS COMPARISON:  None Available. MEDICATIONS: The patient is  currently admitted to the hospital and receiving intravenous antibiotics. The antibiotics were administered within an appropriate time frame prior to the initiation of the procedure. 4 mg Zofran, intravenous ANESTHESIA/SEDATION: Moderate (conscious) sedation was employed during this procedure. A total of Versed 2 mg and Fentanyl 100 mcg was administered intravenously. Moderate Sedation Time: 51 minutes. The patient's level of consciousness and vital signs were monitored continuously by radiology nursing throughout the procedure under my direct supervision. CONTRAST:  None COMPLICATIONS: None immediate. PROCEDURE: RADIATION DOSE REDUCTION: This exam was performed according to the departmental dose-optimization program which includes automated exposure control, adjustment of the mA and/or kV according to patient size and/or use of iterative reconstruction technique. Informed written consent was obtained from the patient after a discussion of the risks, benefits and alternatives to treatment. The patient was placed supine on the CT gantry and a pre procedural CT was performed re-demonstrating the known abscess/fluid collections within the location. The procedure was planned. A timeout was performed prior to the initiation of the procedure. The right lower quadrant was prepped and draped in the usual sterile fashion. The overlying soft tissues were anesthetized with 1% lidocaine with epinephrine at the planned needle entry sites. Appropriate trajectories were planned with the use of a 22 gauge spinal needles. Two separate 18 gauge trocar needle were advanced into the abscess/fluid collections and short Amplatz super stiff wires wer coiled within the collections. Appropriate positioning was confirmed with a limited CT scan. The tracts were serially dilated allowing placement of 2, 10 French all-purpose drainage catheters. Appropriate positioning was confirmed with a limited postprocedural CT scan. Approximately 5 mL ml  of purulent fluid was aspirated from the right inferior and lateral drain. Approximately 2 mL of purulence and serous fluid was aspirated from the more anterior and superior drain. The tubes were connected to a bulb suction and sutured in place. A dressing was placed. The patient tolerated the procedure well without immediate post procedural complication. IMPRESSION: Successful CT guided placement of 2, 10 French all purpose drain catheters into the right lower quadrant fluid collections with aspiration of small volume purulent fluid. Samples were sent to the laboratory as requested by the ordering clinical team. Ruthann Cancer, MD [facility] Radiology Electronically Signed   By: Ruthann Cancer M.D.   On: 06/03/2022 18:52   CT GUIDED PERITONEAL/RETROPERITONEAL FLUID DRAIN BY PERC CATH  Result Date: 06/03/2022 INDICATION: 31 year old female history of Crohn's disease presenting with abdominal pain and sepsis with CT findings concerning for intra-abdominal abscess associated with the terminal ileum. EXAM: 1. CT PERC DRAIN PERITONEAL ABSCESS 2. CT PERC DRAIN PERITONEAL ABSCESS COMPARISON:  None Available. MEDICATIONS: The patient is currently admitted to the hospital and receiving intravenous antibiotics. The antibiotics were administered within an appropriate time frame prior to the initiation of the procedure. 4 mg Zofran, intravenous ANESTHESIA/SEDATION: Moderate (conscious) sedation was employed during this  procedure. A total of Versed 2 mg and Fentanyl 100 mcg was administered intravenously. Moderate Sedation Time: 51 minutes. The patient's level of consciousness and vital signs were monitored continuously by radiology nursing throughout the procedure under my direct supervision. CONTRAST:  None COMPLICATIONS: None immediate. PROCEDURE: RADIATION DOSE REDUCTION: This exam was performed according to the departmental dose-optimization program which includes automated  exposure control, adjustment of the mA and/or kV according to patient size and/or use of iterative reconstruction technique. Informed written consent was obtained from the patient after a discussion of the risks, benefits and alternatives to treatment. The patient was placed supine on the CT gantry and a pre procedural CT was performed re-demonstrating the known abscess/fluid collections within the location. The procedure was planned. A timeout was performed prior to the initiation of the procedure. The right lower quadrant was prepped and draped in the usual sterile fashion. The overlying soft tissues were anesthetized with 1% lidocaine with epinephrine at the planned needle entry sites. Appropriate trajectories were planned with the use of a 22 gauge spinal needles. Two separate 18 gauge trocar needle were advanced into the abscess/fluid collections and short Amplatz super stiff wires wer coiled within the collections. Appropriate positioning was confirmed with a limited CT scan. The tracts were serially dilated allowing placement of 2, 10 French all-purpose drainage catheters. Appropriate positioning was confirmed with a limited postprocedural CT scan. Approximately 5 mL ml of purulent fluid was aspirated from the right inferior and lateral drain. Approximately 2 mL of purulence and serous fluid was aspirated from the more anterior and superior drain. The tubes were connected to a bulb suction and sutured in place. A dressing was placed. The patient tolerated the procedure well without immediate post procedural complication. IMPRESSION: Successful CT guided placement of 2, 10 French all purpose drain catheters into the right lower quadrant fluid collections with aspiration of small volume purulent fluid. Samples were sent to the laboratory as requested by the ordering clinical team. Ruthann Cancer, MD [facility] Radiology Electronically Signed   By: Ruthann Cancer  M.D.   On: 06/03/2022 18:52   CT ABDOMEN PELVIS W CONTRAST  Result Date: 06/01/2022 CLINICAL DATA:  Right lower quadrant pain for 3 days, history of Crohn's disease EXAM: CT ABDOMEN AND PELVIS WITH CONTRAST TECHNIQUE: Multidetector CT imaging of the abdomen and pelvis was performed using the standard protocol following bolus administration of intravenous contrast. RADIATION DOSE REDUCTION: This exam was performed according to the departmental dose-optimization program which includes automated exposure control, adjustment of the mA and/or kV according to patient size and/or use of iterative reconstruction technique. CONTRAST:  134m OMNIPAQUE IOHEXOL 300 MG/ML  SOLN COMPARISON:  05/03/2022 FINDINGS: Lower chest: No acute abnormality. Hepatobiliary: No focal liver abnormality is seen. No gallstones, gallbladder wall thickening, or biliary dilatation. Pancreas: Unremarkable. No pancreatic ductal dilatation or surrounding inflammatory changes. Spleen: Normal in size without focal abnormality. Adrenals/Urinary Tract: Adrenal glands are within normal limits. Kidneys demonstrate a normal enhancement pattern bilaterally. The bladder is incompletely distended. There is however significant distension of the collecting systems and ureters bilaterally likely related to local compression by inflammatory changes in the pelvis. These changes are new from the prior exam. No calculi are identified. Stomach/Bowel: Mild fecal material is noted within the colon. The cecum and distal terminal ileum demonstrates significant inflammatory change much worse than that noted on the prior exam consistent with the known history of Crohn's. The appendix is within normal limits. The distal most  aspect of the small bowel is mildly dilated related to the inflammatory changes. A few small air-fluid collections are identified 1 seen on image number 55 of series 2 measuring 2.2 cm and a second measuring 2.3 cm on image number 62 of series 2. These  are highly suspicious for focal abscesses and new from the prior exam. An additional area measuring up to 3 cm best seen on image number 64 of series 2 and image number 43 of series 5 is also suspicious for abscess. More proximal small bowel is within normal limits. The stomach is unremarkable. Vascular/Lymphatic: No significant vascular findings are present. No enlarged abdominal or pelvic lymph nodes. Reproductive: Uterus and bilateral adnexa are unremarkable. Other: No hernia is noted.  Free fluid is noted within the pelvis. Musculoskeletal: No acute or significant osseous findings. IMPRESSION: Significant progression of inflammatory change in the right lower quadrant when compared with the prior exam. Multiple small enhancing air-fluid collections are identified suspicious for abscesses and likely related to prior perforation. Free pelvic fluid is noted as well. These changes are consistent with the given clinical history of Crohn's and likely represent an acute flare with perforation and abscess formation. Bilateral hydronephrosis and hydroureter worse on the left than the right likely related to local compression related to the inflammatory changes. Electronically Signed   By: Inez Catalina M.D.   On: 06/01/2022 20:19    Labs:  CBC: Recent Labs    06/02/22 0645 06/02/22 0754 06/03/22 0512 06/04/22 0031  WBC 13.2* 11.2* 8.3 8.1  HGB 9.3* 8.8* 9.2* 9.5*  HCT 28.3* 26.9* 28.6* 29.4*  PLT 245 205 222 259    COAGS: Recent Labs    06/02/22 0754  INR 1.6*  APTT 37*    BMP: Recent Labs    06/02/22 0544 06/02/22 0754 06/03/22 0512 06/04/22 0031  NA 134* 140 135 140  K 3.1* 3.4* 4.1 3.9  CL 100 111 103 108  CO2 _0 GLUCOSE 128* 119* 82 121*  BUN 8 7 <5* <5*  CALCIUM 8.0* 7.8* 8.2* 8.6*  CREATININE 0.53 0.51 0.46 0.52  GFRNONAA >60 >60 >60 >60    LIVER FUNCTION TESTS: Recent Labs    05/22/22 1220 06/01/22 1529 06/02/22 0754 06/03/22 0512 06/04/22 0031  BILITOT  0.3 0.8 0.3  --  0.5  AST 12* 8* 6*  --  9*  ALT _1 --  11  ALKPHOS 51 62 43  --  58  PROT 6.7 6.6 4.4*  --  5.7*  ALBUMIN 3.1* 3.1* 1.9* 2.3* 2.4*    Assessment and Plan: Pt with hx Chron's disease, now with associated abd abscesses; s/p rt ant/lat drain placements 8/5; afebrile; WBC nl, hgb 9.5(9.2), creat nl; fluid cx - mult species   Drain Location: RLQ x2 Size: Fr size: 10 Fr Date of placement: 06/02/22  Currently to: Drain collection device: suction bulb  With minimal drain OP and irritation with mid abd drain flush would rec f/u CT in next 24-48 hrs to assess adequacy of drainage; resend drain fluid for culture    Electronically Signed: D. Rowe Robert, PA-C 06/04/2022, 9:45 AM   I spent a total of 15 Minutes at the the patient's bedside AND on the patient's hospital floor or unit, greater than 50% of which was counseling/coordinating care for abdominal abscess drains    Patient ID: Sandra Foster, female   DOB: February 12, 1991, 31 y.o.   MRN: [id number]

## 2022-06-04 NOTE — Progress Notes (Signed)
PROGRESS NOTE  Sandra Foster [id number] DOB: 1991/06/04   PCP: Jorge Ny, PA-C  Patient is from: Home.  DOA: 06/01/2022 LOS: 3  Chief complaints Chief Complaint  Patient presents with   Abdominal Pain     Brief Narrative / Interim history: 31 year old F with PMH of DM-2, chron's disease on Humira and recurrent hospitalization due to Crohn's flareup for which she has been treated with a steroid returning with new onset RLQ pain radiating to right flank and back as well as groin for 2 days, and admitted for sepsis secondary to multiple intra-abdominal abscesses with perforation likely related to Crohn's flare and bilateral hydroureteronephrosis, left > right.  Patient received CTX and Flagyl in ED, then switched to IV Zosyn by general surgery.  The next day, she has CT-guided drain placement x2 by IR.  Abscess culture with multiple species and no anaerobes isolated. Remains on IV Zosyn.  General surgery and GI following.   Subjective: Seen and examined earlier this morning.  No major events overnight of this morning.  She has intermittent RLQ sharp pain, and cramping at times.  Tolerated clear liquid diet.  No other complaints.  Objective: Vitals:   06/03/22 1358 06/03/22 1458 06/03/22 2057 06/04/22 0531  BP: 106/60  104/66 100/63  Pulse: 80  81 87  Resp: (!) 21 18 18 18   Temp: 98.3 F (36.8 C)  98.3 F (36.8 C) 98.7 F (37.1 C)  TempSrc: Oral  Oral Oral  SpO2: 100%  99% 100%  Weight:      Height:        Examination:  GENERAL: No apparent distress.  Nontoxic. HEENT: MMM.  Vision and hearing grossly intact.  NECK: Supple.  No apparent JVD.  RESP:  No IWOB.  Fair aeration bilaterally. CVS:  RRR. Heart sounds normal.  ABD/GI/GU: BS+. Abd soft.  Tenderness to palpation.  Drains in place.  Serosanguineous. MSK/EXT:  Moves extremities. No apparent deformity. No edema.  SKIN: no apparent skin lesion or wound NEURO: Awake and alert. Oriented appropriately.  No  apparent focal neuro deficit. PSYCH: Calm. Normal affect.   Procedures:  8/5-CT-guided abdominal abscess drain placement x2  Microbiology summarized: 8/4-blood cultures NGTD 8/5-abscess culture with multiple species.  No anaerobes. 8/7-fluid culture sent.   Assessment and plan: Principal Problem:   Crohn's disease of ileum, with abscess ([place]) Active Problems:   Insulin-requiring or dependent type II diabetes mellitus ([place])   Tobacco abuse   Uncontrolled NIDDM-2 with hyperglycemia   Iron deficiency anemia   Acute Crohn's disease ([place])   Gastroesophageal reflux disease   Sepsis ([place])   Sepsis secondary to multiple intra-abdominal abscesses/perforation likely related to Crohn's flare-presents with abdominal pain for 2 days.  CT raised concern for acute Crohn's flare with perforation and multiple abscess formation.  CRP elevated to 24.  -S/p drain placement for abscess x2 on 8/5.  Abscess culture with multiple species.  No anaerobes. -Continue IV Zosyn -Pain control. -Advance to full liquid diet by general surgery. -Encourage ambulation/mobility -Follow adalimumab levels and antibodies -Appreciate help by general surgery and GI -GI holding off steroid in the setting of acute infection   Bilateral hydroureteronephrosis, left> right.  No AKI.  She reports hesitancy but no other UTI symptoms.  Likely from intra-abdominal abscesses. -Discussed with urology, Dr. Jeffie Pollock who recommends observation given normal renal function -May need renal US prior to discharge to reassess  Uncontrolled IDDM-2 in the setting of chronic steroid use for Crohn's: A1c 9.6% (11% on 6/26) Recent  Labs  Lab 06/03/22 1610 06/03/22 1919 06/04/22 0006 06/04/22 0543 06/04/22 1156  GLUCAP 130* 117* 113* 109* 125*  -Continue SSI-moderate every 4 hours.  Normocytic anemia: Partly dilutional from IV fluid hydration. Recent Labs    04/24/22 0423 04/25/22 0423 04/26/22 0421 05/03/22 1115 05/22/22 1220  06/01/22 1529 06/02/22 0645 06/02/22 0754 06/03/22 0512 06/04/22 0031  HGB 9.8* 9.9* 10.3* 13.4 11.3* 12.7 9.3* 8.8* 9.2* 9.5*  -Monitor  Prior tobacco use: Reports quitting smoking.  Congratulated   Body mass index is 21.71 kg/m.           DVT prophylaxis:  enoxaparin (LOVENOX) injection 40 mg Start: 06/03/22 1000 SCDs Start: 06/02/22 0754  Code Status: Full code Family Communication: None at bedside Level of care: Med-Surg Status is: Inpatient Remains inpatient appropriate because: Sepsis due to intra-abdominal abscesses and perforation in the setting of Crohn's flare   Final disposition: Home once medically stable Consultants:  General surgery Gastroenterology Interventional radiology  Sch Meds:  Scheduled Meds:  enoxaparin (LOVENOX) injection  40 mg Subcutaneous Q24H   insulin aspart  0-15 Units Subcutaneous Q4H   lip balm   Topical BID   pantoprazole  40 mg Oral BID AC   sodium chloride flush  3 mL Intravenous Q12H   sodium chloride flush  5 mL Intracatheter Q8H   Continuous Infusions:  lactated ringers 1,000 mL with potassium chloride 20 mEq infusion 125 mL/hr at 06/04/22 1047   methocarbamol (ROBAXIN) IV 1,000 mg (06/04/22 1029)   ondansetron (ZOFRAN) IV     piperacillin-tazobactam (ZOSYN)  IV 3.375 g (06/04/22 1028)   PRN Meds:.acetaminophen **OR** acetaminophen, alum & mag hydroxide-simeth, bisacodyl, diphenhydrAMINE, hydrALAZINE, HYDROmorphone (DILAUDID) injection, magic mouthwash, menthol-cetylpyridinium, methocarbamol (ROBAXIN) IV, metoprolol tartrate, ondansetron (ZOFRAN) IV **OR** ondansetron (ZOFRAN) IV, ondansetron **OR** [DISCONTINUED] ondansetron (ZOFRAN) IV, oxyCODONE, phenol, prochlorperazine, senna-docusate, traZODone  Antimicrobials: Anti-infectives (From admission, onward)    Start     Dose/Rate Route Frequency Ordered Stop   06/02/22 2200  cefTRIAXone (ROCEPHIN) 2 g in sodium chloride 0.9 % 100 mL IVPB  Status:  Discontinued        2  g 200 mL/hr over 30 Minutes Intravenous Every 24 hours 06/02/22 0753 06/02/22 0820   06/02/22 0753  metroNIDAZOLE (FLAGYL) IVPB 500 mg  Status:  Discontinued        500 mg 100 mL/hr over 60 Minutes Intravenous Every 12 hours 06/02/22 0753 06/02/22 0820   06/02/22 0300  piperacillin-tazobactam (ZOSYN) IVPB 3.375 g        3.375 g 12.5 mL/hr over 240 Minutes Intravenous Every 8 hours 06/01/22 2209 06/07/22 0259   06/01/22 1900  cefTRIAXone (ROCEPHIN) 2 g in sodium chloride 0.9 % 100 mL IVPB        2 g 200 mL/hr over 30 Minutes Intravenous  Once 06/01/22 1855 06/01/22 1950   06/01/22 1900  metroNIDAZOLE (FLAGYL) IVPB 500 mg        500 mg 100 mL/hr over 60 Minutes Intravenous  Once 06/01/22 1855 06/01/22 2033        I have personally reviewed the following labs and images: CBC: Recent Labs  Lab 06/01/22 1529 06/02/22 0645 06/02/22 0754 06/03/22 0512 06/04/22 0031  WBC 25.2* 13.2* 11.2* 8.3 8.1  HGB 12.7 9.3* 8.8* 9.2* 9.5*  HCT 38.9 28.3* 26.9* 28.6* 29.4*  MCV 84.4 85.0 85.7 87.5 85.2  PLT 348 245 205 222 259   BMP &GFR Recent Labs  Lab 06/01/22 1529 06/02/22 0544 06/02/22 0754 06/03/22 0512 06/04/22 0031  NA 132* 134* 140 135 140  K 3.5 3.1* 3.4* 4.1 3.9  CL 96* 100 111 103 108  CO2 25 26 25 24 25   GLUCOSE 185* 128* 119* 82 121*  BUN 9 8 7  <5* <5*  CREATININE 0.55 0.53 0.51 0.46 0.52  CALCIUM 8.9 8.0* 7.8* 8.2* 8.6*  MG  --   --  1.8 1.9 1.9  PHOS  --   --  2.8 3.7 3.5   Estimated Creatinine Clearance: 96.3 mL/min (by C-G formula based on SCr of 0.52 mg/dL). Liver & Pancreas: Recent Labs  Lab 06/01/22 1529 06/02/22 0754 06/03/22 0512 06/04/22 0031  AST 8* 6*  --  9*  ALT 16 10  --  11  ALKPHOS 62 43  --  58  BILITOT 0.8 0.3  --  0.5  PROT 6.6 4.4*  --  5.7*  ALBUMIN 3.1* 1.9* 2.3* 2.4*   Recent Labs  Lab 06/01/22 1529  LIPASE 26   No results for input(s): "AMMONIA" in the last 168 hours. Diabetic: Recent Labs    06/03/22 0512  HGBA1C 9.6*    Recent Labs  Lab 06/03/22 1610 06/03/22 1919 06/04/22 0006 06/04/22 0543 06/04/22 1156  GLUCAP 130* 117* 113* 109* 125*   Cardiac Enzymes: No results for input(s): "CKTOTAL", "CKMB", "CKMBINDEX", "TROPONINI" in the last 168 hours. No results for input(s): "PROBNP" in the last 8760 hours. Coagulation Profile: Recent Labs  Lab 06/02/22 0754  INR 1.6*   Thyroid Function Tests: No results for input(s): "TSH", "T4TOTAL", "FREET4", "T3FREE", "THYROIDAB" in the last 72 hours. Lipid Profile: No results for input(s): "CHOL", "HDL", "LDLCALC", "TRIG", "CHOLHDL", "LDLDIRECT" in the last 72 hours. Anemia Panel: No results for input(s): "VITAMINB12", "FOLATE", "FERRITIN", "TIBC", "IRON", "RETICCTPCT" in the last 72 hours. Urine analysis:    Component Value Date/Time   COLORURINE YELLOW 06/01/2022 1905   APPEARANCEUR HAZY (A) 06/01/2022 1905   LABSPEC 1.011 06/01/2022 1905   PHURINE 6.0 06/01/2022 1905   GLUCOSEU NEGATIVE 06/01/2022 1905   HGBUR MODERATE (A) 06/01/2022 1905   BILIRUBINUR NEGATIVE 06/01/2022 1905   KETONESUR 20 (A) 06/01/2022 1905   PROTEINUR NEGATIVE 06/01/2022 1905   UROBILINOGEN 0.2 08/11/2015 2134   NITRITE NEGATIVE 06/01/2022 1905   LEUKOCYTESUR NEGATIVE 06/01/2022 1905   Sepsis Labs: Invalid input(s): "PROCALCITONIN", "LACTICIDVEN"  Microbiology: Recent Results (from the past 240 hour(s))  Culture, blood (single)     Status: None (Preliminary result)   Collection Time: 06/01/22  7:26 PM   Specimen: BLOOD  Result Value Ref Range Status   Specimen Description   Final    BLOOD SITE NOT SPECIFIED Performed at [facility] [address], [place], [place] [address]    Special Requests   Final    BOTTLES DRAWN AEROBIC AND ANAEROBIC Blood Culture adequate volume Performed at [facility] [address] [place], [place] [address]    Culture   Final    NO GROWTH 3 DAYS Performed at [facility], [place]  [address] [place], [place] [address]    Report Status PENDING  Incomplete  Aerobic/Anaerobic Culture w Gram Stain (surgical/deep wound)     Status: Abnormal (Preliminary result)   Collection Time: 06/02/22  5:10 PM   Specimen: Abdomen; Abscess  Result Value Ref Range Status   Specimen Description   Final    ABDOMEN Performed at [facility] [address] [place], [place] [address]    Special Requests   Final    NONE  Performed at [facility], [address], [place], [place] [address]    Gram Stain   Final    ABUNDANT WBC PRESENT,BOTH PMN AND MONONUCLEAR FEW GRAM POSITIVE COCCI IN PAIRS AND CHAINS Performed at [facility], [address], [place], [place] [address]    Culture (A)  Final    MULTIPLE SPECIES PRESENT, SUGGEST RECOLLECTION NO ANAEROBES ISOLATED; CULTURE IN PROGRESS FOR 5 DAYS    Report Status PENDING  Incomplete    Radiology Studies: No results found.    [facility] T. Lonya [facility]  If 7PM-7AM, please contact night-coverage [email or web address] 06/04/2022, 1:43 PM

## 2022-06-05 LAB — RENAL FUNCTION PANEL
Albumin: 2.4 g/dL — ABNORMAL LOW (ref 3.5–5.0)
Anion gap: 9 (ref 5–15)
BUN: <5 mg/dL — ABNORMAL LOW (ref 6–20)
CO2: 26 mmol/L (ref 22–32)
Calcium: 8.5 mg/dL — ABNORMAL LOW (ref 8.9–10.3)
Chloride: 102 mmol/L (ref 98–111)
Creatinine, Ser: 0.63 mg/dL (ref 0.44–1.00)
GFR, Estimated: >60 mL/min (ref >60)
Glucose, Bld: 107 mg/dL — ABNORMAL HIGH (ref 70–99)
Phosphorus: 4.1 mg/dL (ref 2.5–4.6)
Potassium: 4 mmol/L (ref 3.5–5.1)
Sodium: 137 mmol/L (ref 135–145)

## 2022-06-05 LAB — CBC
HCT: 29.2 % — ABNORMAL LOW (ref 36.0–46.0)
Hemoglobin: 9.1 g/dL — ABNORMAL LOW (ref 12.0–15.0)
MCH: 27 pg (ref 26.0–34.0)
MCHC: 31.2 g/dL (ref 30.0–36.0)
MCV: 86.6 fL (ref 80.0–100.0)
Platelets: 280 10*3/uL (ref 150–400)
RBC: 3.37 MIL/uL — ABNORMAL LOW (ref 3.87–5.11)
RDW: 14.8 % (ref 11.5–15.5)
WBC: 7.5 10*3/uL (ref 4.0–10.5)
nRBC: 0 % (ref 0.0–0.2)

## 2022-06-05 LAB — GLUCOSE, CAPILLARY
Glucose-Capillary: 102 mg/dL — ABNORMAL HIGH (ref 70–99)
Glucose-Capillary: 103 mg/dL — ABNORMAL HIGH (ref 70–99)
Glucose-Capillary: 115 mg/dL — ABNORMAL HIGH (ref 70–99)
Glucose-Capillary: 126 mg/dL — ABNORMAL HIGH (ref 70–99)
Glucose-Capillary: 128 mg/dL — ABNORMAL HIGH (ref 70–99)
Glucose-Capillary: 143 mg/dL — ABNORMAL HIGH (ref 70–99)
Glucose-Capillary: 94 mg/dL (ref 70–99)

## 2022-06-05 LAB — MAGNESIUM: Magnesium: 1.9 mg/dL (ref 1.7–2.4)

## 2022-06-05 MED ORDER — DIPHENHYDRAMINE HCL 12.5 MG/5ML PO ELIX
12.5000 mg | ORAL_SOLUTION | Freq: Four times a day (QID) | ORAL | Status: DC | PRN
  Administered 2022-06-06: 25 mg via ORAL
  Filled 2022-06-05: qty 10

## 2022-06-05 MED ORDER — POLYETHYLENE GLYCOL 3350 17 G PO PACK
17.0000 g | PACK | Freq: Every day | ORAL | Status: DC
  Administered 2022-06-05 – 2022-06-08 (×4): 17 g via ORAL
  Filled 2022-06-05 (×4): qty 1

## 2022-06-05 NOTE — Progress Notes (Signed)
Referring Physician(s): Gross,S  Supervising Physician: Aletta Edouard  Patient Status:  [facility] - In-pt  Chief Complaint:  Patient with hx of Crohn's, found to have multiple intraabdominal fluid collections, s/p 2 RLQ drain placement by Dr. Serafina Royals on 06/02/22   Subjective:  Pt laying in bed, NAD.  Reports abdominal pain but she is not sure if it related to her Crohn's dz or abscess.  No N/V.    Allergies: Nsaids  Medications: Prior to Admission medications   Medication Sig Start Date End Date Taking? Authorizing Provider  acetaminophen (TYLENOL) 500 MG tablet Take 1 tablet (500 mg total) by mouth every 6 (six) hours as needed. 05/22/22  Yes Nanavati, Ankit, MD  Adalimumab (HUMIRA PEN) 40 MG/0.4ML PNKT Inject 40 mg into the skin every 14 (fourteen) days. Every other Tuesday   Yes [provider]  carboxymethylcellulose (REFRESH PLUS) 0.5 % SOLN Place 1 drop into both eyes daily as needed (dry eyes).   Yes [provider]  etonogestrel (NEXPLANON) 68 MG IMPL implant 1 each by Subdermal route once.   Yes [provider]  insulin glargine (LANTUS) 100 UNIT/ML Solostar Pen Inject 15 Units into the skin 2 (two) times daily. Patient taking differently: Inject 9 Units into the skin daily. 04/26/22  Yes Mercy Riding, MD  morphine (MSIR) 30 MG tablet Take 1 tablet (30 mg total) by mouth every 12 (twelve) hours as needed for up to 9 doses for severe pain. 05/22/22  Yes Varney Biles, MD  blood glucose meter kit and supplies Dispense based on patient and insurance preference. Use up to four times daily as directed. (FOR ICD-10 E10.9, E11.9). 04/26/22   Mercy Riding, MD  insulin aspart (NOVOLOG) 100 UNIT/ML FlexPen CBG < 70: Implement Hypoglycemia Standing Orders and refer to Hypoglycemia Standing Orders sidebar report CBG 70 - 120: 0 units CBG 121 - 150: 2 units CBG 151 - 200: 3 units CBG 201 - 250: 5 units CBG 251 - 300: 8 units CBG 301 - 350: 11 units CBG 351 - 400:  15 units CBG > 400: call MD and obtain STAT lab verification Patient not taking: Reported on 06/01/2022 04/26/22   Mercy Riding, MD  Insulin Pen Needle (PEN NEEDLES) 30G X 5 MM MISC 1 Pen by Does not apply route 4 (four) times daily -  before meals and at bedtime. 04/26/22   Mercy Riding, MD  nicotine (NICODERM CQ - DOSED IN MG/24 HOURS) 14 mg/24hr patch Place 1 patch (14 mg total) onto the skin daily. Patient not taking: Reported on 06/01/2022 04/26/22   Mercy Riding, MD  oxyCODONE (OXY IR/ROXICODONE) 5 MG immediate release tablet Take 1-2 tablets (5-10 mg total) by mouth every 4 (four) hours as needed for severe pain. Patient not taking: Reported on 06/01/2022 05/04/22   Cherene Altes, MD  pantoprazole (PROTONIX) 40 MG tablet Take 1 tablet (40 mg total) by mouth daily. Patient not taking: Reported on 06/01/2022 04/26/22 05/26/22  Mercy Riding, MD  predniSONE (DELTASONE) 10 MG tablet Take 5 tablets (50 mg total) by mouth daily. Patient not taking: Reported on 06/01/2022 05/22/22   Varney Biles, MD  amitriptyline (ELAVIL) 25 MG tablet Take 1-2 tablets (25-50 mg total) by mouth at bedtime as needed (headache). 03/25/20 06/18/20  Gregor Hams, MD     Vital Signs: BP 108/65 (BP Location: Right Arm)   Pulse 90   Temp 99 F (37.2 C) (Oral)   Resp 18  Ht _0  (1.676 m)   Wt 134 lb 7.7 oz (61 kg)   SpO2 98%   BMI 21.71 kg/m   Physical Exam Vitals reviewed.  Constitutional:      General: She is not in acute distress.    Appearance: She is well-developed. She is not ill-appearing.  HENT:     Head: Normocephalic.  Pulmonary:     Effort: Pulmonary effort is normal.  Abdominal:     Palpations: Abdomen is soft.  Skin:    General: Skin is warm and dry.     Comments: Positive 2 RLQ drains to suction bulb.Dressing is clean, dry, and intact. Trach of  serosanguinous fluid noted in the bulbs. Drains flushes well, does not aspirate much.   Mild burning sensation when medial drain is flushed.     Neurological:     Mental Status: She is alert and oriented to person, place, and time.  Psychiatric:        Mood and Affect: Mood normal.        Behavior: Behavior normal.     Imaging: CT GUIDED PERITONEAL/RETROPERITONEAL FLUID DRAIN BY PERC CATH  Result Date: 06/03/2022 INDICATION: 31 year old female history of Crohn's disease presenting with abdominal pain and sepsis with CT findings concerning for intra-abdominal abscess associated with the terminal ileum. EXAM: 1. CT PERC DRAIN PERITONEAL ABSCESS 2. CT PERC DRAIN PERITONEAL ABSCESS COMPARISON:  None Available. MEDICATIONS: The patient is currently admitted to the hospital and receiving intravenous antibiotics. The antibiotics were administered within an appropriate time frame prior to the initiation of the procedure. 4 mg Zofran, intravenous ANESTHESIA/SEDATION: Moderate (conscious) sedation was employed during this procedure. A total of Versed 2 mg and Fentanyl 100 mcg was administered intravenously. Moderate Sedation Time: 51 minutes. The patient's level of consciousness and vital signs were monitored continuously by radiology nursing throughout the procedure under my direct supervision. CONTRAST:  None COMPLICATIONS: None immediate. PROCEDURE: RADIATION DOSE REDUCTION: This exam was performed according to the departmental dose-optimization program which includes automated exposure control, adjustment of the mA and/or kV according to patient size and/or use of iterative reconstruction technique. Informed written consent was obtained from the patient after a discussion of the risks, benefits and alternatives to treatment. The patient was placed supine on the CT gantry and a pre procedural CT was performed re-demonstrating the known abscess/fluid collections within the location. The procedure was planned. A timeout was performed prior to the initiation of the procedure. The right lower quadrant was prepped and draped in the usual sterile fashion. The  overlying soft tissues were anesthetized with 1% lidocaine with epinephrine at the planned needle entry sites. Appropriate trajectories were planned with the use of a 22 gauge spinal needles. Two separate 18 gauge trocar needle were advanced into the abscess/fluid collections and short Amplatz super stiff wires wer coiled within the collections. Appropriate positioning was confirmed with a limited CT scan. The tracts were serially dilated allowing placement of 2, 10 French all-purpose drainage catheters. Appropriate positioning was confirmed with a limited postprocedural CT scan. Approximately 5 mL ml of purulent fluid was aspirated from the right inferior and lateral drain. Approximately 2 mL of purulence and serous fluid was aspirated from the more anterior and superior drain. The tubes were connected to a bulb suction and sutured in place. A dressing was placed. The patient tolerated the procedure well without immediate post procedural complication. IMPRESSION: Successful CT guided placement of 2, 10 French all purpose drain catheters into the right lower quadrant fluid  collections with aspiration of small volume purulent fluid. Samples were sent to the laboratory as requested by the ordering clinical team. Ruthann Cancer, MD [facility] Electronically Signed   By: Ruthann Cancer M.D.   On: 06/03/2022 18:52   CT GUIDED PERITONEAL/RETROPERITONEAL FLUID DRAIN BY PERC CATH  Result Date: 06/03/2022 INDICATION: 31 year old female history of Crohn's disease presenting with abdominal pain and sepsis with CT findings concerning for intra-abdominal abscess associated with the terminal ileum. EXAM: 1. CT PERC DRAIN PERITONEAL ABSCESS 2. CT PERC DRAIN PERITONEAL ABSCESS COMPARISON:  None Available. MEDICATIONS: The patient is currently admitted to the hospital and receiving intravenous antibiotics. The antibiotics were administered within an appropriate time frame  prior to the initiation of the procedure. 4 mg Zofran, intravenous ANESTHESIA/SEDATION: Moderate (conscious) sedation was employed during this procedure. A total of Versed 2 mg and Fentanyl 100 mcg was administered intravenously. Moderate Sedation Time: 51 minutes. The patient's level of consciousness and vital signs were monitored continuously by radiology nursing throughout the procedure under my direct supervision. CONTRAST:  None COMPLICATIONS: None immediate. PROCEDURE: RADIATION DOSE REDUCTION: This exam was performed according to the departmental dose-optimization program which includes automated exposure control, adjustment of the mA and/or kV according to patient size and/or use of iterative reconstruction technique. Informed written consent was obtained from the patient after a discussion of the risks, benefits and alternatives to treatment. The patient was placed supine on the CT gantry and a pre procedural CT was performed re-demonstrating the known abscess/fluid collections within the location. The procedure was planned. A timeout was performed prior to the initiation of the procedure. The right lower quadrant was prepped and draped in the usual sterile fashion. The overlying soft tissues were anesthetized with 1% lidocaine with epinephrine at the planned needle entry sites. Appropriate trajectories were planned with the use of a 22 gauge spinal needles. Two separate 18 gauge trocar needle were advanced into the abscess/fluid collections and short Amplatz super stiff wires wer coiled within the collections. Appropriate positioning was confirmed with a limited CT scan. The tracts were serially dilated allowing placement of 2, 10 French all-purpose drainage catheters. Appropriate positioning was confirmed with a limited postprocedural CT scan. Approximately 5 mL ml of purulent fluid was aspirated from the right inferior and lateral drain. Approximately 2 mL of purulence and serous fluid was aspirated from  the more anterior and superior drain. The tubes were connected to a bulb suction and sutured in place. A dressing was placed. The patient tolerated the procedure well without immediate post procedural complication. IMPRESSION: Successful CT guided placement of 2, 10 French all purpose drain catheters into the right lower quadrant fluid collections with aspiration of small volume purulent fluid. Samples were sent to the laboratory as requested by the ordering clinical team. Ruthann Cancer, MD [facility] Electronically Signed   By: Ruthann Cancer M.D.   On: 06/03/2022 18:52   CT ABDOMEN PELVIS W CONTRAST  Result Date: 06/01/2022 CLINICAL DATA:  Right lower quadrant pain for 3 days, history of Crohn's disease EXAM: CT ABDOMEN AND PELVIS WITH CONTRAST TECHNIQUE: Multidetector CT imaging of the abdomen and pelvis was performed using the standard protocol following bolus administration of intravenous contrast. RADIATION DOSE REDUCTION: This exam was performed according to the departmental dose-optimization program which includes automated exposure control, adjustment of the mA and/or kV according to patient size and/or use of iterative reconstruction technique. CONTRAST:  183m OMNIPAQUE IOHEXOL 300 MG/ML  SOLN COMPARISON:  05/03/2022 FINDINGS: Lower chest: No acute abnormality. Hepatobiliary: No focal liver abnormality is seen. No gallstones, gallbladder wall thickening, or biliary dilatation. Pancreas: Unremarkable. No pancreatic ductal dilatation or surrounding inflammatory changes. Spleen: Normal in size without focal abnormality. Adrenals/Urinary Tract: Adrenal glands are within normal limits. Kidneys demonstrate a normal enhancement pattern bilaterally. The bladder is incompletely distended. There is however significant distension of the collecting systems and ureters bilaterally likely related to local compression by inflammatory changes in the pelvis.  These changes are new from the prior exam. No calculi are identified. Stomach/Bowel: Mild fecal material is noted within the colon. The cecum and distal terminal ileum demonstrates significant inflammatory change much worse than that noted on the prior exam consistent with the known history of Crohn's. The appendix is within normal limits. The distal most aspect of the small bowel is mildly dilated related to the inflammatory changes. A few small air-fluid collections are identified 1 seen on image number 55 of series 2 measuring 2.2 cm and a second measuring 2.3 cm on image number 62 of series 2. These are highly suspicious for focal abscesses and new from the prior exam. An additional area measuring up to 3 cm best seen on image number 64 of series 2 and image number 43 of series 5 is also suspicious for abscess. More proximal small bowel is within normal limits. The stomach is unremarkable. Vascular/Lymphatic: No significant vascular findings are present. No enlarged abdominal or pelvic lymph nodes. Reproductive: Uterus and bilateral adnexa are unremarkable. Other: No hernia is noted.  Free fluid is noted within the pelvis. Musculoskeletal: No acute or significant osseous findings. IMPRESSION: Significant progression of inflammatory change in the right lower quadrant when compared with the prior exam. Multiple small enhancing air-fluid collections are identified suspicious for abscesses and likely related to prior perforation. Free pelvic fluid is noted as well. These changes are consistent with the given clinical history of Crohn's and likely represent an acute flare with perforation and abscess formation. Bilateral hydronephrosis and hydroureter worse on the left than the right likely related to local compression related to the inflammatory changes. Electronically Signed   By: Inez Catalina M.D.   On: 06/01/2022 20:19    Labs:  CBC: Recent Labs    06/02/22 0754 06/03/22 0512 06/04/22 0031 06/05/22 0401   WBC 11.2* 8.3 8.1 7.5  HGB 8.8* 9.2* 9.5* 9.1*  HCT 26.9* 28.6* 29.4* 29.2*  PLT 205 222 259 280    COAGS: Recent Labs    06/02/22 0754  INR 1.6*  APTT 37*    BMP: Recent Labs    06/02/22 0754 06/03/22 0512 06/04/22 0031 06/05/22 0401  NA 140 135 140 137  K 3.4* 4.1 3.9 4.0  CL 111 103 108 102  CO2 _0 GLUCOSE 119* 82 121* 107*  BUN 7 <5* <5* <5*  CALCIUM 7.8* 8.2* 8.6* 8.5*  CREATININE 0.51 0.46 0.52 0.63  GFRNONAA >60 >60 >60 >60    LIVER FUNCTION TESTS: Recent Labs    05/22/22 1220 06/01/22 1529 06/02/22 0754 06/03/22 0512 06/04/22 0031 06/05/22 0401  BILITOT 0.3 0.8 0.3  --  0.5  --   AST 12* 8* 6*  --  9*  --   ALT _1 --  11  --   ALKPHOS 51 62 43  --  58  --   PROT 6.7 6.6 4.4*  --  5.7*  --   ALBUMIN 3.1* 3.1* 1.9* 2.3*  2.4* 2.4*    Assessment and Plan:  31 y.o. female with hx Crohn's dz, found to have multiple air-fluid collections suspicious for abscesses and likely related to prior perforation, s/p 2 RLQ drain placement with Dr. Serafina Royals on 06/02/22.   WBC normal 7.6 Cx showed multiple species VSS OP RLQ medial 10 mL, RLQ lateral 15 mL, both clear serosanguinous   Drain Location: RLQ x 2 Size: Fr size: 10 Fr Date of placement: 8/5  Currently to: Drain collection device: suction bulb 24 hour output:  Output by Drain (mL) 06/03/22 0701 - 06/03/22 1900 06/03/22 1901 - 06/04/22 0700 06/04/22 0701 - 06/04/22 1900 06/04/22 1901 - 06/05/22 0700 06/05/22 0701 - 06/05/22 1339  Closed System Drain 1 Superior;Right;Medial Abdomen Bulb (JP) 10 Fr.  5 10 0 10  Closed System Drain Right;Ventral;Inferior Hip Bulb (JP) 10 Fr.  5 15 0 0    Interval imaging/drain manipulation:  None   Current examination: Flushes/aspirates with resistance.  Dressed appropriately.   Plan:  IF output remains low overnight, will obtain follow up CT tomorrow.  Will need to review with radiologists well as general surgery if drains are ok to be removed.    Continue TID flushes with 5 cc NS. Record output Q shift. Dressing changes QD or PRN if soiled.  Call IR APP or on call IR MD if difficulty flushing or sudden change in drain output.  Repeat imaging/possible drain injection once output < 10 mL/QD (excluding flush material). Consideration for drain removal if output is < 10 mL/QD (excluding flush material), pending discussion with the providing surgical service.  Discharge planning: Please contact IR APP or on call IR MD prior to patient d/c to ensure appropriate follow up plans are in place. Typically patient will follow up with IR clinic 10-14 days post d/c for repeat imaging/possible drain injection. IR scheduler will contact patient with date/time of appointment. Patient will need to flush drain QD with 5 cc NS, record output QD, dressing changes every 2-3 days or earlier if soiled.   IR will continue to follow - please call with questions or concerns.   Electronically Signed: Tera Mater, PA-C 06/05/2022, 1:33 PM   I spent a total of 15 Minutes at the the patient's bedside AND on the patient's hospital floor or unit, greater than 50% of which was counseling/coordinating care for RLQ drains.   This chart was dictated using voice recognition software.  Despite best efforts to proofread,  errors can occur which can change the documentation meaning.

## 2022-06-05 NOTE — Progress Notes (Signed)
Subjective: Continues to feel better.  Tolerating FLD with no issues.  No BM, but no nausea and not feeling terribly bloated  ROS: See above, otherwise other systems negative  Objective: Vital signs in last 24 hours: Temp:  [98 F (36.7 C)-99 F (37.2 C)] 98 F (36.7 C) (08/08 0847) Pulse Rate:  [75-87] 81 (08/08 0847) Resp:  [16-18] 18 (08/08 0847) BP: (101-127)/(59-86) 127/86 (08/08 0847) SpO2:  [99 %-100 %] 100 % (08/08 0847) Last BM Date : 04/24/22  Intake/Output from previous day: 08/07 0701 - 08/08 0700 In: 3592.9 [P.O.:320; I.V.:2897.6; IV Piggyback:375.3] Out: 25 [Drains:25] Intake/Output this shift: Total I/O In: 120 [P.O.:120] Out: 10 [Drains:10]  PE: Abd: soft, tender around her drains, but abdominal pain otherwise much less tender.  +BS, both JPs with serousang output.  ND  Lab Results:  Recent Labs    06/04/22 0031 06/05/22 0401  WBC 8.1 7.5  HGB 9.5* 9.1*  HCT 29.4* 29.2*  PLT 259 280   BMET Recent Labs    06/04/22 0031 06/05/22 0401  NA 140 137  K 3.9 4.0  CL 108 102  CO2 25 26  GLUCOSE 121* 107*  BUN <5* <5*  CREATININE 0.52 0.63  CALCIUM 8.6* 8.5*   PT/INR No results for input(s): "LABPROT", "INR" in the last 72 hours.  CMP     Component Value Date/Time   NA 137 06/05/2022 0401   K 4.0 06/05/2022 0401   CL 102 06/05/2022 0401   CO2 26 06/05/2022 0401   GLUCOSE 107 (H) 06/05/2022 0401   BUN <5 (L) 06/05/2022 0401   CREATININE 0.63 06/05/2022 0401   CALCIUM 8.5 (L) 06/05/2022 0401   PROT 5.7 (L) 06/04/2022 0031   ALBUMIN 2.4 (L) 06/05/2022 0401   AST 9 (L) 06/04/2022 0031   ALT 11 06/04/2022 0031   ALKPHOS 58 06/04/2022 0031   BILITOT 0.5 06/04/2022 0031   GFRNONAA >60 06/05/2022 0401   GFRAA >60 08/28/2015 2150   Lipase     Component Value Date/Time   LIPASE 26 06/01/2022 1529       Studies/Results: No results found.  Anti-infectives: Anti-infectives (From admission, onward)    Start     Dose/Rate  Route Frequency Ordered Stop   06/02/22 2200  cefTRIAXone (ROCEPHIN) 2 g in sodium chloride 0.9 % 100 mL IVPB  Status:  Discontinued        2 g 200 mL/hr over 30 Minutes Intravenous Every 24 hours 06/02/22 0753 06/02/22 0820   06/02/22 0753  metroNIDAZOLE (FLAGYL) IVPB 500 mg  Status:  Discontinued        500 mg 100 mL/hr over 60 Minutes Intravenous Every 12 hours 06/02/22 0753 06/02/22 0820   06/02/22 0300  piperacillin-tazobactam (ZOSYN) IVPB 3.375 g        3.375 g 12.5 mL/hr over 240 Minutes Intravenous Every 8 hours 06/01/22 2209 06/07/22 0259   06/01/22 1900  cefTRIAXone (ROCEPHIN) 2 g in sodium chloride 0.9 % 100 mL IVPB        2 g 200 mL/hr over 30 Minutes Intravenous  Once 06/01/22 1855 06/01/22 1950   06/01/22 1900  metroNIDAZOLE (FLAGYL) IVPB 500 mg        500 mg 100 mL/hr over 60 Minutes Intravenous  Once 06/01/22 1855 06/01/22 2033        Assessment/Plan Crohn's disease with abscess -patient seems to be improving -cont abx therapy, cx with multiple species, gram + cocci -cont both JP drains for  now -adv to low fiber diet -further crohn's management per GI  FEN - low fiber/IVFs per primary VTE - lovenox ID - [email or web address]  I reviewed Consultant GI notes, hospitalist notes, last 24 h vitals and pain scores, last 48 h intake and output, last 24 h labs and trends, and last 24 h imaging results.   LOS: 4 days    Letha Cape , [facility] 06/05/2022, 9:44 AM Please see Amion for pager number during day hours 7:00am-4:30pm or 7:00am -11:30am on weekends

## 2022-06-05 NOTE — Progress Notes (Signed)
PROGRESS NOTE  Sandra Foster ZOX:[id number] DOB: [birth year]   PCP: Quita Skye, PA-C  Patient is from: Home.  DOA: 06/01/2022 LOS: 4  Chief complaints Chief Complaint  Patient presents with   Abdominal Pain     Brief Narrative / Interim history: 31 year old F with PMH of DM-2, chron's disease on Humira and recurrent hospitalization due to Crohn's flareup for which she has been treated with a steroid returning with new onset RLQ pain radiating to right flank and back as well as groin for 2 days, and admitted for sepsis secondary to multiple intra-abdominal abscesses with perforation likely related to Crohn's flare and bilateral hydroureteronephrosis, left > right.  Patient received CTX and Flagyl in ED, then switched to IV Zosyn by general surgery.  The next day, she has CT-guided drain placement x2 by IR.  Abscess culture with multiple species and no anaerobes isolated. Remains on IV Zosyn.  General surgery, IR and GI following.  Likely discharge on p.o. antibiotics in the next 24 to 48 hours if cleared.    Subjective: Seen and examined earlier this morning.  No major events overnight of this morning.  Feels better except for intermittent RLQ sharp pain that she attributes to a Crohn's.  Pain has improved.  No nausea or vomiting.  Has not had a bowel movement yet.  Objective: Vitals:   06/04/22 2047 06/05/22 0512 06/05/22 0847 06/05/22 1223  BP: 112/73 (!) 101/59 127/86 108/65  Pulse: 78 75 81 90  Resp: 18 16 18 18   Temp: 98.4 F (36.9 C) 98.6 F (37 C) 98 F (36.7 C) 99 F (37.2 C)  TempSrc: Oral Oral Oral Oral  SpO2: 100% 99% 100% 98%  Weight:      Height:        Examination:  GENERAL: No apparent distress.  Nontoxic. HEENT: MMM.  Vision and hearing grossly intact.  NECK: Supple.  No apparent JVD.  RESP:  No IWOB.  Fair aeration bilaterally. CVS:  RRR. Heart sounds normal.  ABD/GI/GU: BS+. Abd soft.  Mild RLQ tenderness.  Drains in place. MSK/EXT:  Moves  extremities. No apparent deformity. No edema.  SKIN: no apparent skin lesion or wound NEURO: Awake and alert. Oriented appropriately.  No apparent focal neuro deficit. PSYCH: Calm. Normal affect.   Procedures:  8/5-CT-guided abdominal abscess drain placement x2  Microbiology summarized: 8/4-blood cultures NGTD 8/5-abscess culture with multiple species.  No anaerobes.  Assessment and plan: Principal Problem:   Crohn's disease of ileum, with abscess (HCC) Active Problems:   Insulin-requiring or dependent type II diabetes mellitus (HCC)   Tobacco abuse   Uncontrolled NIDDM-2 with hyperglycemia   Iron deficiency anemia   Acute Crohn's disease (HCC)   Gastroesophageal reflux disease   Sepsis (HCC)   Sepsis secondary to multiple intra-abdominal abscesses/perforation likely related to Crohn's flare-presents with abdominal pain for 2 days.  CT raised concern for acute Crohn's flare with perforation and multiple abscess formation.  CRP elevated to 24.  Symptoms and leukocytosis improving. -S/p drain for abscess x2 on 8/5.  Culture with multiple species.  Continue IV Zosyn -Pain improving.  Continue analgesics as needed. -Surgery advance diet to soft -Encourage ambulation/mobility -Follow adalimumab levels and antibodies -GI, Dr. 10/5 to see patient later today -GI to decide treatment plan for Crohn's disease -IR following.  Repeat CT on 8/9 if drain output remains low   Bilateral hydroureteronephrosis, left> right.  No AKI.  She reports hesitancy but no other UTI symptoms.  Likely from intra-abdominal abscesses. -  Discussed with urology, Dr. Annabell Howells who recommends observation given normal renal function -May need renal US prior to discharge to reassess  Uncontrolled IDDM-2 in the setting of chronic steroid use for Crohn's: A1c 9.6% (11% on 6/26) Recent Labs  Lab 06/04/22 2136 06/05/22 0031 06/05/22 0507 06/05/22 0727 06/05/22 1142  GLUCAP 105* 115* 102* 94 126*  -Continue  SSI-moderate every 4 hours.  Normocytic anemia: Partly dilutional from IV fluid hydration. Recent Labs    04/25/22 0423 04/26/22 0421 05/03/22 1115 05/22/22 1220 06/01/22 1529 06/02/22 0645 06/02/22 0754 06/03/22 0512 06/04/22 0031 06/05/22 0401  HGB 9.9* 10.3* 13.4 11.3* 12.7 9.3* 8.8* 9.2* 9.5* 9.1*  -Monitor  Prior tobacco use: Reports quitting smoking.  Congratulated   Body mass index is 21.71 kg/m.           DVT prophylaxis:  enoxaparin (LOVENOX) injection 40 mg Start: 06/03/22 1000 SCDs Start: 06/02/22 0754  Code Status: Full code Family Communication: None at bedside Level of care: Med-Surg Status is: Inpatient Remains inpatient appropriate because: Sepsis due to intra-abdominal abscesses and perforation in the setting of Crohn's flare   Final disposition: Home once medically stable Consultants:  General surgery Gastroenterology Interventional radiology  Sch Meds:  Scheduled Meds:  enoxaparin (LOVENOX) injection  40 mg Subcutaneous Q24H   insulin aspart  0-15 Units Subcutaneous Q4H   lip balm   Topical BID   pantoprazole  40 mg Oral BID AC   polyethylene glycol  17 g Oral Daily   sodium chloride flush  3 mL Intravenous Q12H   sodium chloride flush  5 mL Intracatheter Q8H   Continuous Infusions:  lactated ringers 1,000 mL with potassium chloride 20 mEq infusion 125 mL/hr at 06/05/22 1000   methocarbamol (ROBAXIN) IV 1,000 mg (06/05/22 1222)   ondansetron (ZOFRAN) IV     piperacillin-tazobactam (ZOSYN)  IV 3.375 g (06/05/22 1038)   PRN Meds:.acetaminophen **OR** acetaminophen, alum & mag hydroxide-simeth, bisacodyl, diphenhydrAMINE, hydrALAZINE, HYDROmorphone (DILAUDID) injection, magic mouthwash, menthol-cetylpyridinium, methocarbamol (ROBAXIN) IV, metoprolol tartrate, ondansetron (ZOFRAN) IV **OR** ondansetron (ZOFRAN) IV, ondansetron **OR** [DISCONTINUED] ondansetron (ZOFRAN) IV, oxyCODONE, phenol, prochlorperazine, senna-docusate,  traZODone  Antimicrobials: Anti-infectives (From admission, onward)    Start     Dose/Rate Route Frequency Ordered Stop   06/02/22 2200  cefTRIAXone (ROCEPHIN) 2 g in sodium chloride 0.9 % 100 mL IVPB  Status:  Discontinued        2 g 200 mL/hr over 30 Minutes Intravenous Every 24 hours 06/02/22 0753 06/02/22 0820   06/02/22 0753  metroNIDAZOLE (FLAGYL) IVPB 500 mg  Status:  Discontinued        500 mg 100 mL/hr over 60 Minutes Intravenous Every 12 hours 06/02/22 0753 06/02/22 0820   06/02/22 0300  piperacillin-tazobactam (ZOSYN) IVPB 3.375 g        3.375 g 12.5 mL/hr over 240 Minutes Intravenous Every 8 hours 06/01/22 2209 06/07/22 0259   06/01/22 1900  cefTRIAXone (ROCEPHIN) 2 g in sodium chloride 0.9 % 100 mL IVPB        2 g 200 mL/hr over 30 Minutes Intravenous  Once 06/01/22 1855 06/01/22 1950   06/01/22 1900  metroNIDAZOLE (FLAGYL) IVPB 500 mg        500 mg 100 mL/hr over 60 Minutes Intravenous  Once 06/01/22 1855 06/01/22 2033        I have personally reviewed the following labs and images: CBC: Recent Labs  Lab 06/02/22 0645 06/02/22 0754 06/03/22 0512 06/04/22 0031 06/05/22 0401  WBC 13.2* 11.2* 8.3 8.1  7.5  HGB 9.3* 8.8* 9.2* 9.5* 9.1*  HCT 28.3* 26.9* 28.6* 29.4* 29.2*  MCV 85.0 85.7 87.5 85.2 86.6  PLT 245 205 222 259 280   BMP &GFR Recent Labs  Lab 06/02/22 0544 06/02/22 0754 06/03/22 0512 06/04/22 0031 06/05/22 0401  NA 134* 140 135 140 137  K 3.1* 3.4* 4.1 3.9 4.0  CL 100 111 103 108 102  CO2 26 25 24 25 26   GLUCOSE 128* 119* 82 121* 107*  BUN 8 7 <5* <5* <5*  CREATININE 0.53 0.51 0.46 0.52 0.63  CALCIUM 8.0* 7.8* 8.2* 8.6* 8.5*  MG  --  1.8 1.9 1.9 1.9  PHOS  --  2.8 3.7 3.5 4.1   Estimated Creatinine Clearance: 96.3 mL/min (by C-G formula based on SCr of 0.63 mg/dL). Liver & Pancreas: Recent Labs  Lab 06/01/22 1529 06/02/22 0754 06/03/22 0512 06/04/22 0031 06/05/22 0401  AST 8* 6*  --  9*  --   ALT 16 10  --  11  --   ALKPHOS 62  43  --  58  --   BILITOT 0.8 0.3  --  0.5  --   PROT 6.6 4.4*  --  5.7*  --   ALBUMIN 3.1* 1.9* 2.3* 2.4* 2.4*   Recent Labs  Lab 06/01/22 1529  LIPASE 26   No results for input(s): "AMMONIA" in the last 168 hours. Diabetic: Recent Labs    06/03/22 0512  HGBA1C 9.6*   Recent Labs  Lab 06/04/22 2136 06/05/22 0031 06/05/22 0507 06/05/22 0727 06/05/22 1142  GLUCAP 105* 115* 102* 94 126*   Cardiac Enzymes: No results for input(s): "CKTOTAL", "CKMB", "CKMBINDEX", "TROPONINI" in the last 168 hours. No results for input(s): "PROBNP" in the last 8760 hours. Coagulation Profile: Recent Labs  Lab 06/02/22 0754  INR 1.6*   Thyroid Function Tests: No results for input(s): "TSH", "T4TOTAL", "FREET4", "T3FREE", "THYROIDAB" in the last 72 hours. Lipid Profile: No results for input(s): "CHOL", "HDL", "LDLCALC", "TRIG", "CHOLHDL", "LDLDIRECT" in the last 72 hours. Anemia Panel: No results for input(s): "VITAMINB12", "FOLATE", "FERRITIN", "TIBC", "IRON", "RETICCTPCT" in the last 72 hours. Urine analysis:    Component Value Date/Time   COLORURINE YELLOW 06/01/2022 1905   APPEARANCEUR HAZY (A) 06/01/2022 1905   LABSPEC 1.011 06/01/2022 1905   PHURINE 6.0 06/01/2022 1905   GLUCOSEU NEGATIVE 06/01/2022 1905   HGBUR MODERATE (A) 06/01/2022 1905   BILIRUBINUR NEGATIVE 06/01/2022 1905   KETONESUR 20 (A) 06/01/2022 1905   PROTEINUR NEGATIVE 06/01/2022 1905   UROBILINOGEN 0.2 08/11/2015 2134   NITRITE NEGATIVE 06/01/2022 1905   LEUKOCYTESUR NEGATIVE 06/01/2022 1905   Sepsis Labs: Invalid input(s): "PROCALCITONIN", "LACTICIDVEN"  Microbiology: Recent Results (from the past 240 hour(s))  Culture, blood (single)     Status: None (Preliminary result)   Collection Time: 06/01/22  7:26 PM   Specimen: BLOOD  Result Value Ref Range Status   Specimen Description   Final    BLOOD SITE NOT SPECIFIED Performed at [facility], [address] [place], [place]  [place]    Special Requests   Final    BOTTLES DRAWN AEROBIC AND ANAEROBIC Blood Culture adequate volume Performed at [facility], [address], [place], [place]    Culture   Final    NO GROWTH 4 DAYS Performed at [facility], [address], [place], [place]    Report Status PENDING  Incomplete  Aerobic/Anaerobic Culture w Gram Stain (surgical/deep wound)  Status: Abnormal (Preliminary result)   Collection Time: 06/02/22  5:10 PM   Specimen: Abdomen; Abscess  Result Value Ref Range Status   Specimen Description   Final    ABDOMEN Performed at [facility], [address] [place], [place] [address]    Special Requests   Final    NONE Performed at [facility], [address], [place], [place] [address]    Gram Stain   Final    ABUNDANT WBC PRESENT,BOTH PMN AND MONONUCLEAR FEW GRAM POSITIVE COCCI IN PAIRS AND CHAINS Performed at [facility], [address], [place], [place] [address]    Culture (A)  Final    MULTIPLE ORGANISMS PRESENT, NONE PREDOMINANT NO ANAEROBES ISOLATED; CULTURE IN PROGRESS FOR 5 DAYS    Report Status PENDING  Incomplete    Radiology Studies: No results found.    Sandra Foster T. [facility]  If 7PM-7AM, please contact night-coverage [email or web address] 06/05/2022, 2:16 PM

## 2022-06-05 NOTE — Progress Notes (Signed)
   06/05/22 1515  Mobility  HOB Elevated/Bed Position Self regulated  Activity Ambulated independently in hallway  Range of Motion/Exercises Active  Level of Assistance Modified independent, requires aide device or extra time  Assistive Device None  Distance Ambulated (ft) 200 ft  Activity Response Tolerated well  Transport method Ambulatory  $Mobility charge 1 Mobility   Pt received standing and agreeable to mobility. Pt was excited to ambulate & mentioned feeling better after ambulation. Pt had some standing breaks due to sharp pain in lower right abdomen. Pt left in room standing with all necessities within reach.   [facility]

## 2022-06-05 NOTE — Progress Notes (Signed)
Mobility Specialist - Progress Note     06/05/22 1000  Mobility  Activity Ambulated with assistance in hallway  Level of Assistance Modified independent, requires aide device or extra time  Assistive Device Front wheel walker  Distance Ambulated (ft) 500 ft  Activity Response Tolerated well  $Mobility charge 1 Mobility   Pt was agreeable to mobilize. Pt was able to sit EOB independently from supine position. Pt used a RW to ambulate in hallway and stated having a 5/10 pain while ambulating. Pt upon returning to room was left EOB to eat breakfast and with all necessities in reach.  Billey Chang Mobility Specialist

## 2022-06-05 NOTE — Progress Notes (Signed)
Subjective: No complaints.  Feeling well.    Objective: Vital signs in last 24 hours: Temp:  [98 F (36.7 C)-99 F (37.2 C)] 99 F (37.2 C) (08/08 1223) Pulse Rate:  [75-90] 90 (08/08 1223) Resp:  [16-18] 18 (08/08 1223) BP: (101-127)/(59-86) 108/65 (08/08 1223) SpO2:  [98 %-100 %] 98 % (08/08 1223) Last BM Date : 04/24/22  Intake/Output from previous day: 08/07 0701 - 08/08 0700 In: 3592.9 [P.O.:320; I.V.:2897.6; IV Piggyback:375.3] Out: 25 [Drains:25] Intake/Output this shift: Total I/O In: 617.8 [P.O.:120; I.V.:497.8] Out: 10 [Drains:10]  General appearance: alert and no distress GI: some lower abdominal pain  Lab Results: Recent Labs    06/03/22 0512 06/04/22 0031 06/05/22 0401  WBC 8.3 8.1 7.5  HGB 9.2* 9.5* 9.1*  HCT 28.6* 29.4* 29.2*  PLT 222 259 280   BMET Recent Labs    06/03/22 0512 06/04/22 0031 06/05/22 0401  NA 135 140 137  K 4.1 3.9 4.0  CL 103 108 102  CO2 24 25 26   GLUCOSE 82 121* 107*  BUN <5* <5* <5*  CREATININE 0.46 0.52 0.63  CALCIUM 8.2* 8.6* 8.5*   LFT Recent Labs    06/04/22 0031 06/05/22 0401  PROT 5.7*  --   ALBUMIN 2.4* 2.4*  AST 9*  --   ALT 11  --   ALKPHOS 58  --   BILITOT 0.5  --    PT/INR No results for input(s): "LABPROT", "INR" in the last 72 hours. Hepatitis Panel No results for input(s): "HEPBSAG", "HCVAB", "HEPAIGM", "HEPBIGM" in the last 72 hours. C-Diff No results for input(s): "CDIFFTOX" in the last 72 hours. Fecal Lactopherrin No results for input(s): "FECLLACTOFRN" in the last 72 hours.  Studies/Results: No results found.  Medications: Scheduled:  enoxaparin (LOVENOX) injection  40 mg Subcutaneous Q24H   insulin aspart  0-15 Units Subcutaneous Q4H   lip balm   Topical BID   pantoprazole  40 mg Oral BID AC   polyethylene glycol  17 g Oral Daily   sodium chloride flush  3 mL Intravenous Q12H   sodium chloride flush  5 mL Intracatheter Q8H   Continuous:  methocarbamol (ROBAXIN) IV 1,000 mg  (06/05/22 1222)   ondansetron (ZOFRAN) IV     piperacillin-tazobactam (ZOSYN)  IV 3.375 g (06/05/22 1038)    Assessment/Plan: 1) Crohn's disease. 2) Abscess from Crohn's disease.   She is improved with the drain, but it is clear that Humira is not controlling her disease.  She does need to be on another biologic.  Prednisone can help and also harm at the same time. For now, she will remain off of prednisone.  Plan: 1) Continue antibiotics. 2) Continue with drains. 3) Maintain Humira for now. 4) Follow up with Dr. 08/05/22 upon discharge.  LOS: 4 days   Sandra Foster D 06/05/2022, 4:34 PM

## 2022-06-06 ENCOUNTER — Inpatient Hospital Stay (HOSPITAL_COMMUNITY): Payer: Medicaid Other

## 2022-06-06 LAB — RENAL FUNCTION PANEL
Albumin: 2.3 g/dL — ABNORMAL LOW (ref 3.5–5.0)
Anion gap: 8 (ref 5–15)
BUN: <5 mg/dL — ABNORMAL LOW (ref 6–20)
CO2: 25 mmol/L (ref 22–32)
Calcium: 8 mg/dL — ABNORMAL LOW (ref 8.9–10.3)
Chloride: 105 mmol/L (ref 98–111)
Creatinine, Ser: 0.53 mg/dL (ref 0.44–1.00)
GFR, Estimated: >60 mL/min (ref >60)
Glucose, Bld: 101 mg/dL — ABNORMAL HIGH (ref 70–99)
Phosphorus: 4.7 mg/dL — ABNORMAL HIGH (ref 2.5–4.6)
Potassium: 3.4 mmol/L — ABNORMAL LOW (ref 3.5–5.1)
Sodium: 138 mmol/L (ref 135–145)

## 2022-06-06 LAB — CULTURE, BLOOD (SINGLE)
Culture: NO GROWTH
Special Requests: ADEQUATE

## 2022-06-06 LAB — CBC
HCT: 27.7 % — ABNORMAL LOW (ref 36.0–46.0)
Hemoglobin: 9 g/dL — ABNORMAL LOW (ref 12.0–15.0)
MCH: 27.7 pg (ref 26.0–34.0)
MCHC: 32.5 g/dL (ref 30.0–36.0)
MCV: 85.2 fL (ref 80.0–100.0)
Platelets: 285 10*3/uL (ref 150–400)
RBC: 3.25 MIL/uL — ABNORMAL LOW (ref 3.87–5.11)
RDW: 14.9 % (ref 11.5–15.5)
WBC: 6.3 10*3/uL (ref 4.0–10.5)
nRBC: 0 % (ref 0.0–0.2)

## 2022-06-06 LAB — GLUCOSE, CAPILLARY
Glucose-Capillary: 103 mg/dL — ABNORMAL HIGH (ref 70–99)
Glucose-Capillary: 114 mg/dL — ABNORMAL HIGH (ref 70–99)
Glucose-Capillary: 125 mg/dL — ABNORMAL HIGH (ref 70–99)
Glucose-Capillary: 127 mg/dL — ABNORMAL HIGH (ref 70–99)
Glucose-Capillary: 131 mg/dL — ABNORMAL HIGH (ref 70–99)
Glucose-Capillary: 99 mg/dL (ref 70–99)

## 2022-06-06 LAB — MAGNESIUM: Magnesium: 1.8 mg/dL (ref 1.7–2.4)

## 2022-06-06 MED ORDER — BISACODYL 10 MG RE SUPP
10.0000 mg | Freq: Once | RECTAL | Status: AC
  Administered 2022-06-06: 10 mg via RECTAL
  Filled 2022-06-06: qty 1

## 2022-06-06 MED ORDER — POTASSIUM CHLORIDE CRYS ER 20 MEQ PO TBCR
40.0000 meq | EXTENDED_RELEASE_TABLET | Freq: Once | ORAL | Status: AC
  Administered 2022-06-06: 40 meq via ORAL
  Filled 2022-06-06: qty 2

## 2022-06-06 MED ORDER — PIPERACILLIN-TAZOBACTAM 3.375 G IVPB
3.3750 g | Freq: Three times a day (TID) | INTRAVENOUS | Status: DC
  Administered 2022-06-06 – 2022-06-08 (×6): 3.375 g via INTRAVENOUS
  Filled 2022-06-06 (×6): qty 50

## 2022-06-06 NOTE — Inpatient Diabetes Management (Signed)
Inpatient Diabetes Program Recommendations  AACE/[place]: New Consensus Statement on Inpatient Glycemic Control (2015)  Target Ranges:  Prepandial:   less than 140 mg/dL      Peak postprandial:   less than 180 mg/dL (1-2 hours)      Critically ill patients:  140 - 180 mg/dL   Lab Results  Component Value Date   GLUCAP 131 (H) 06/06/2022   HGBA1C 9.6 (H) 06/03/2022    Review of Glycemic Control  Diabetes history: DM2 Outpatient Diabetes medications: Lantus 9 units QD Current orders for Inpatient glycemic control: Novolog 0-15 Q4H  HgbA1C 9.6%  RN requested Diabetes Coordinator to see pt regarding her diabetes and HgbA1C of 9.6%   Inpatient Diabetes Program Recommendations:    Spoke with pt at bedside. This Coordinator had taught insulin pen administration to pt at last visit. Pt states she's not having any issues, her diabetes control has been good at home. Had no questions. States she is having a hard time with her colitis. Has followed up with PCP and will continue with taking care of her diabetes.   Blood sugars have been in good control while inpatient.   For discharge today.   Thank you. Ailene Ards, RD, LDN, CDCES Inpatient Diabetes Coordinator [phone]

## 2022-06-06 NOTE — Progress Notes (Signed)
PROGRESS NOTE    Sandra Foster  FTD:[id number] DOB: Mar 22, 1991 DOA: 06/01/2022 PCP: Quita Skye, PA-C   Brief Narrative: 31 year old with past medical history significant for diabetes type 2, Crohn's disease on Humira and recurrent hospitalization due to Crohn's flare she has been treated with steroids presents with new onset right lower quadrant abdominal pain and right flank pain that is started 2 days prior to admission.  Patient was admitted with sepsis secondary to multiple intra-abdominal abscess with perforation likely related to Crohn's flare and bilateral hydroureteronephrosis, left more than right.  Patient has been getting IV Zosyn, general surgery and GI following.  She underwent CT-guided drain placement x 2 by IR.  Abscess culture with multiple species.   Assessment & Plan:   Principal Problem:   Crohn's disease of ileum, with abscess (HCC) Active Problems:   Insulin-requiring or dependent type II diabetes mellitus (HCC)   Tobacco abuse   Uncontrolled NIDDM-2 with hyperglycemia   Iron deficiency anemia   Acute Crohn's disease (HCC)   Gastroesophageal reflux disease   Sepsis (HCC)   1-Sepsis secondary to multiple intra-abdominal abscess/perforation likely related to Crohn's flares: POA -Continue with IV antibiotics.  -Follow culture: growing gram positive cocci in pairs and chains. Multiples organism present.  -Patient underwent CT-guided abdominal abscess drain placement x 2 by IR on 06/02/2022 -Needs to follow up with Sx in 4-6 weeks.  -Diet advanced to Carb modified.  -needs to follow up with GI for treatment of Crohn's.   2-Bilateral hydroureteronephrosis left > right Plan to proceed with renal US>  Renal function stable.   Uncontrolled IDDM 2 in the setting of chronic steroid use for Crohn's A1c 9.6 On SSI.   Normocytic anemia: Hb stable.   Prior tobacco use: counseling  Hypokalemia; replete orally.  Constipation: Was started on laxative Surgery  recommendation to have a bowel movement prior to discharge.  Estimated body mass index is 21.71 kg/m as calculated from the following:   Height as of this encounter: 5\' 6"  (1.676 m).   Weight as of this encounter: 61 kg.   DVT prophylaxis: Lovenox Code Status: Full code Family Communication:care discussed with patient.  Disposition Plan:  Status is: Inpatient Remains inpatient appropriate because: management of intra-abdominal abscess.     Consultants:  Surgery GI IR  Procedures:    Antimicrobials:    Subjective: Patient has not had a bowel movement in several days.  She was a started on laxative.  She received a suppository. She is concerned that she will need a new GI doctor because she has been dismissed from Dr. practice.  She does not even know why she was dismissed. Discussed with Dr. Loreta Ave, the above issue, he relates that patient can follow-up with Dr. Elnoria Howard as an outpatient.  Objective: Vitals:   06/05/22 1223 06/05/22 1656 06/05/22 2203 06/06/22 0416  BP: 108/65 118/72 122/82 112/68  Pulse: 90 88 69 63  Resp: 18 18 18 16   Temp: 99 F (37.2 C)  98.2 F (36.8 C) 98.5 F (36.9 C)  TempSrc: Oral  Oral Oral  SpO2: 98% 100% 99% 97%  Weight:      Height:        Intake/Output Summary (Last 24 hours) at 06/06/2022 0727 Last data filed at 06/06/2022 0600 Gross per 24 hour  Intake 2067.37 ml  Output 480 ml  Net 1587.37 ml   Filed Weights   06/01/22 2200  Weight: 61 kg    Examination:  General exam: Appears calm and  comfortable  Respiratory system: Clear to auscultation. Respiratory effort normal. Cardiovascular system: S1 & S2 heard, RRR. No JVD, murmurs, rubs, gallops or clicks. No pedal edema. Gastrointestinal system: Abdomen is nondistended, soft tender, two drain in place.  Central nervous system: Alert and oriented. No focal neurological deficits. Extremities: Symmetric 5 x 5 power.    Data Reviewed: I have personally reviewed following labs  and imaging studies  CBC: Recent Labs  Lab 06/02/22 0754 06/03/22 0512 06/04/22 0031 06/05/22 0401 06/06/22 0420  WBC 11.2* 8.3 8.1 7.5 6.3  HGB 8.8* 9.2* 9.5* 9.1* 9.0*  HCT 26.9* 28.6* 29.4* 29.2* 27.7*  MCV 85.7 87.5 85.2 86.6 85.2  PLT 205 222 259 280 285   Basic Metabolic Panel: Recent Labs  Lab 06/02/22 0754 06/03/22 0512 06/04/22 0031 06/05/22 0401 06/06/22 0420  NA 140 135 140 137 138  K 3.4* 4.1 3.9 4.0 3.4*  CL 111 103 108 102 105  CO2 25 24 25 26 25   GLUCOSE 119* 82 121* 107* 101*  BUN 7 <5* <5* <5* <5*  CREATININE 0.51 0.46 0.52 0.63 0.53  CALCIUM 7.8* 8.2* 8.6* 8.5* 8.0*  MG 1.8 1.9 1.9 1.9 1.8  PHOS 2.8 3.7 3.5 4.1 4.7*   GFR: Estimated Creatinine Clearance: 96.3 mL/min (by C-G formula based on SCr of 0.53 mg/dL). Liver Function Tests: Recent Labs  Lab 06/01/22 1529 06/02/22 0754 06/03/22 0512 06/04/22 0031 06/05/22 0401 06/06/22 0420  AST 8* 6*  --  9*  --   --   ALT 16 10  --  11  --   --   ALKPHOS 62 43  --  58  --   --   BILITOT 0.8 0.3  --  0.5  --   --   PROT 6.6 4.4*  --  5.7*  --   --   ALBUMIN 3.1* 1.9* 2.3* 2.4* 2.4* 2.3*   Recent Labs  Lab 06/01/22 1529  LIPASE 26   No results for input(s): "AMMONIA" in the last 168 hours. Coagulation Profile: Recent Labs  Lab 06/02/22 0754  INR 1.6*   Cardiac Enzymes: No results for input(s): "CKTOTAL", "CKMB", "CKMBINDEX", "TROPONINI" in the last 168 hours. BNP (last 3 results) No results for input(s): "PROBNP" in the last 8760 hours. HbA1C: No results for input(s): "HGBA1C" in the last 72 hours. CBG: Recent Labs  Lab 06/05/22 0727 06/05/22 1142 06/05/22 1654 06/05/22 1959 06/05/22 2343  GLUCAP 94 126* 128* 103* 143*   Lipid Profile: No results for input(s): "CHOL", "HDL", "LDLCALC", "TRIG", "CHOLHDL", "LDLDIRECT" in the last 72 hours. Thyroid Function Tests: No results for input(s): "TSH", "T4TOTAL", "FREET4", "T3FREE", "THYROIDAB" in the last 72 hours. Anemia Panel: No  results for input(s): "VITAMINB12", "FOLATE", "FERRITIN", "TIBC", "IRON", "RETICCTPCT" in the last 72 hours. Sepsis Labs: Recent Labs  Lab 06/02/22 0754  LATICACIDVEN 1.0    Recent Results (from the past 240 hour(s))  Culture, blood (single)     Status: None (Preliminary result)   Collection Time: 06/01/22  7:26 PM   Specimen: BLOOD  Result Value Ref Range Status   Specimen Description   Final    BLOOD SITE NOT SPECIFIED Performed at [facility], [address], [place], [place]    Special Requests   Final    BOTTLES DRAWN AEROBIC AND ANAEROBIC Blood Culture adequate volume Performed at [facility], [address] [place], [place]    Culture   Final    NO GROWTH 4  DAYS Performed at [facility], [address], [place], [place] [address]    Report Status PENDING  Incomplete  Aerobic/Anaerobic Culture w Gram Stain (surgical/deep wound)     Status: Abnormal (Preliminary result)   Collection Time: 06/02/22  5:10 PM   Specimen: Abdomen; Abscess  Result Value Ref Range Status   Specimen Description   Final    ABDOMEN Performed at [facility], [address] [place], [place] [address]    Special Requests   Final    NONE Performed at [facility], [address] [place], [place] [address]    Gram Stain   Final    ABUNDANT WBC PRESENT,BOTH PMN AND MONONUCLEAR FEW GRAM POSITIVE COCCI IN PAIRS AND CHAINS Performed at [facility], [address], [place], [place] [address]    Culture (A)  Final    MULTIPLE ORGANISMS PRESENT, NONE PREDOMINANT NO ANAEROBES ISOLATED; CULTURE IN PROGRESS FOR 5 DAYS    Report Status PENDING  Incomplete         Radiology Studies: No results found.      Scheduled Meds:  enoxaparin (LOVENOX) injection  40 mg Subcutaneous Q24H   insulin aspart  0-15 Units Subcutaneous Q4H   lip balm   Topical BID   pantoprazole  40 mg Oral  BID AC   polyethylene glycol  17 g Oral Daily   sodium chloride flush  3 mL Intravenous Q12H   sodium chloride flush  5 mL Intracatheter Q8H   Continuous Infusions:  methocarbamol (ROBAXIN) IV 1,000 mg (06/06/22 0141)   ondansetron (ZOFRAN) IV     piperacillin-tazobactam (ZOSYN)  IV 3.375 g (06/06/22 0337)     LOS: 5 days    Time spent: 35 minutes    Gurjit Loconte A Deandre Stansel, MD [facility]   If 7PM-7AM, please contact night-coverage [email or web address]  06/06/2022, 7:27 AM

## 2022-06-06 NOTE — Progress Notes (Signed)
Subjective: Continues to feel better.  Tolerating low fiber diet.  Hasn't had a BM yet.  No nausea.  ROS: See above, otherwise other systems negative  Objective: Vital signs in last 24 hours: Temp:  [98.2 F (36.8 C)-99 F (37.2 C)] 98.5 F (36.9 C) (08/09 0416) Pulse Rate:  [63-90] 63 (08/09 0416) Resp:  [16-18] 16 (08/09 0416) BP: (108-122)/(65-82) 112/68 (08/09 0416) SpO2:  [97 %-100 %] 97 % (08/09 0416) Last BM Date : 05/30/22  Intake/Output from previous day: 08/08 0701 - 08/09 0700 In: 2067.4 [P.O.:840; I.V.:943.4; IV Piggyback:264] Out: 480 [Urine:400; Drains:80] Intake/Output this shift: No intake/output data recorded.  PE: Abd: soft, tender around her drains, but abdominal pain otherwise much less tender.  +BS, both JPs with serous output.  ND  Lab Results:  Recent Labs    06/05/22 0401 06/06/22 0420  WBC 7.5 6.3  HGB 9.1* 9.0*  HCT 29.2* 27.7*  PLT 280 285   BMET Recent Labs    06/05/22 0401 06/06/22 0420  NA 137 138  K 4.0 3.4*  CL 102 105  CO2 26 25  GLUCOSE 107* 101*  BUN <5* <5*  CREATININE 0.63 0.53  CALCIUM 8.5* 8.0*   PT/INR No results for input(s): "LABPROT", "INR" in the last 72 hours.  CMP     Component Value Date/Time   NA 138 06/06/2022 0420   K 3.4 (L) 06/06/2022 0420   CL 105 06/06/2022 0420   CO2 25 06/06/2022 0420   GLUCOSE 101 (H) 06/06/2022 0420   BUN <5 (L) 06/06/2022 0420   CREATININE 0.53 06/06/2022 0420   CALCIUM 8.0 (L) 06/06/2022 0420   PROT 5.7 (L) 06/04/2022 0031   ALBUMIN 2.3 (L) 06/06/2022 0420   AST 9 (L) 06/04/2022 0031   ALT 11 06/04/2022 0031   ALKPHOS 58 06/04/2022 0031   BILITOT 0.5 06/04/2022 0031   GFRNONAA >60 06/06/2022 0420   GFRAA >60 08/28/2015 2150   Lipase     Component Value Date/Time   LIPASE 26 06/01/2022 1529       Studies/Results: No results found.  Anti-infectives: Anti-infectives (From admission, onward)    Start     Dose/Rate Route Frequency Ordered Stop    06/02/22 2200  cefTRIAXone (ROCEPHIN) 2 g in sodium chloride 0.9 % 100 mL IVPB  Status:  Discontinued        2 g 200 mL/hr over 30 Minutes Intravenous Every 24 hours 06/02/22 0753 06/02/22 0820   06/02/22 0753  metroNIDAZOLE (FLAGYL) IVPB 500 mg  Status:  Discontinued        500 mg 100 mL/hr over 60 Minutes Intravenous Every 12 hours 06/02/22 0753 06/02/22 0820   06/02/22 0300  piperacillin-tazobactam (ZOSYN) IVPB 3.375 g        3.375 g 12.5 mL/hr over 240 Minutes Intravenous Every 8 hours 06/01/22 2209 06/07/22 0259   06/01/22 1900  cefTRIAXone (ROCEPHIN) 2 g in sodium chloride 0.9 % 100 mL IVPB        2 g 200 mL/hr over 30 Minutes Intravenous  Once 06/01/22 1855 06/01/22 1950   06/01/22 1900  metroNIDAZOLE (FLAGYL) IVPB 500 mg        500 mg 100 mL/hr over 60 Minutes Intravenous  Once 06/01/22 1855 06/01/22 2033        Assessment/Plan Crohn's disease with abscess -patient is improving well and tolerating low fiber diet with no issues. -cont abx therapy, cx with multiple species, gram + cocci -cont both JP  drains for now, further outpatient management of drains per GI -further crohn's management per GI.  Note from yesterday noted on likely transition off Humira to a different biologic as an outpatient -will have patient follow up in 4-6 weeks with surgeon to assure she is improving and no surgery indicated. -may discharge home from our standpoint today if has a BM.  Miralax, suppository ordered  FEN - low fiber/IVFs per primary VTE - lovenox ID - zosyn  I reviewed Consultant GI notes, hospitalist notes, last 24 h vitals and pain scores, last 48 h intake and output, last 24 h labs and trends, and last 24 h imaging results.   LOS: 5 days    Letha Cape , [facility] 06/06/2022, 11:00 AM Please see Amion for pager number during day hours 7:00am-4:30pm or 7:00am -11:30am on weekends

## 2022-06-06 NOTE — Progress Notes (Signed)
Patient ID: Sandra Foster, female   DOB: 1991-05-25, 31 y.o.   MRN: [id number] Pt ambulating in hallway; no BM yet; afebrile; c/o some RLE burning discomfort, some tenderness at abd drain sites, some pain when flushing ant abd drain; WBC nl; hgb stable; drain OP today minimal, yesterday 55/25 cc; instructed pt that if she goes home with drains should flush each with 5 cc once daily, change dressing every 2-3 days, record output; will set her up for f/u IR drain clinic in 7-10 days (call [phone] with questions)

## 2022-06-06 NOTE — Progress Notes (Signed)
Subjective: Since I last evaluated the patient, she seems to be doing much better today.  She had a BM today and denies having any abdominal pain.  She is tolerating a regular diet well and is anticipating discharge tomorrow morning.  Objective: Vital signs in last 24 hours: Temp:  [98.2 F (36.8 C)-98.5 F (36.9 C)] 98.4 F (36.9 C) (08/09 1343) Pulse Rate:  [63-88] 75 (08/09 1343) Resp:  [16-18] 18 (08/09 1343) BP: (112-122)/(68-82) 119/75 (08/09 1343) SpO2:  [97 %-100 %] 99 % (08/09 1343) Last BM Date : 05/30/22  Intake/Output from previous day: 08/08 0701 - 08/09 0700 In: 2067.4 [P.O.:840; I.V.:943.4; IV Piggyback:264] Out: 480 [Urine:400; Drains:80] Intake/Output this shift: Total I/O In: 907.2 [P.O.:720; IV Piggyback:187.2] Out: 0   General appearance: alert, cooperative, appears stated age, and no distress Resp: clear to auscultation bilaterally Cardio: regular rate and rhythm, S1, S2 normal, no murmur, click, rub or gallop GI: soft, non-tender; bowel sounds normal; no masses,  no organomegaly Extremities: extremities normal, atraumatic, no cyanosis or edema  Lab Results: Recent Labs    06/04/22 0031 06/05/22 0401 06/06/22 0420  WBC 8.1 7.5 6.3  HGB 9.5* 9.1* 9.0*  HCT 29.4* 29.2* 27.7*  PLT 259 280 285   BMET Recent Labs    06/04/22 0031 06/05/22 0401 06/06/22 0420  NA 140 137 138  K 3.9 4.0 3.4*  CL 108 102 105  CO2 _0 GLUCOSE 121* 107* 101*  BUN <5* <5* <5*  CREATININE 0.52 0.63 0.53  CALCIUM 8.6* 8.5* 8.0*   LFT Recent Labs    06/04/22 0031 06/05/22 0401 06/06/22 0420  PROT 5.7*  --   --   ALBUMIN 2.4*   < > 2.3*  AST 9*  --   --   ALT 11  --   --   ALKPHOS 58  --   --   BILITOT 0.5  --   --    < > = values in this interval not displayed.   PT/INR No results for input(s): "LABPROT", "INR" in the last 72 hours. Hepatitis Panel No results for input(s): "HEPBSAG", "HCVAB", "HEPAIGM", "HEPBIGM" in the last 72 hours. C-Diff No  results for input(s): "CDIFFTOX" in the last 72 hours. No results for input(s): "CDIFFPCR" in the last 72 hours. Fecal Lactopherrin No results for input(s): "FECLLACTOFRN" in the last 72 hours.  Studies/Results: No results found.  Medications: I have reviewed the patient's current medications. Prior to Admission:  Medications Prior to Admission  Medication Sig Dispense Refill Last Dose   acetaminophen (TYLENOL) 500 MG tablet Take 1 tablet (500 mg total) by mouth every 6 (six) hours as needed. 30 tablet 0 05/29/2022   Adalimumab (HUMIRA PEN) 40 MG/0.4ML PNKT Inject 40 mg into the skin every 14 (fourteen) days. Every other Tuesday   05/29/2022   carboxymethylcellulose (REFRESH PLUS) 0.5 % SOLN Place 1 drop into both eyes daily as needed (dry eyes).   Past Week   etonogestrel (NEXPLANON) 68 MG IMPL implant 1 each by Subdermal route once.   UNKNOWN   insulin glargine (LANTUS) 100 UNIT/ML Solostar Pen Inject 15 Units into the skin 2 (two) times daily. (Patient taking differently: Inject 9 Units into the skin daily.) 15 mL 2 05/30/2022   morphine (MSIR) 30 MG tablet Take 1 tablet (30 mg total) by mouth every 12 (twelve) hours as needed for up to 9 doses for severe pain. 9 tablet 0 06/01/2022 at 1200   blood glucose meter kit and  supplies Dispense based on patient and insurance preference. Use up to four times daily as directed. (FOR ICD-10 E10.9, E11.9). 1 each 0    insulin aspart (NOVOLOG) 100 UNIT/ML FlexPen CBG < 70: Implement Hypoglycemia Standing Orders and refer to Hypoglycemia Standing Orders sidebar report CBG 70 - 120: 0 units CBG 121 - 150: 2 units CBG 151 - 200: 3 units CBG 201 - 250: 5 units CBG 251 - 300: 8 units CBG 301 - 350: 11 units CBG 351 - 400: 15 units CBG > 400: call MD and obtain STAT lab verification (Patient not taking: Reported on 06/01/2022) 15 mL 11 Not Taking   Insulin Pen Needle (PEN NEEDLES) 30G X 5 MM MISC 1 Pen by Does not apply route 4 (four) times daily -  before meals and at  bedtime. 100 each 1    nicotine (NICODERM CQ - DOSED IN MG/24 HOURS) 14 mg/24hr patch Place 1 patch (14 mg total) onto the skin daily. (Patient not taking: Reported on 06/01/2022) 28 patch 0 Not Taking   oxyCODONE (OXY IR/ROXICODONE) 5 MG immediate release tablet Take 1-2 tablets (5-10 mg total) by mouth every 4 (four) hours as needed for severe pain. (Patient not taking: Reported on 06/01/2022) 30 tablet 0 Completed Course   pantoprazole (PROTONIX) 40 MG tablet Take 1 tablet (40 mg total) by mouth daily. (Patient not taking: Reported on 06/01/2022) 30 tablet 0 Not Taking   predniSONE (DELTASONE) 10 MG tablet Take 5 tablets (50 mg total) by mouth daily. (Patient not taking: Reported on 06/01/2022) 25 tablet 0 Completed Course   Scheduled:  enoxaparin (LOVENOX) injection  40 mg Subcutaneous Q24H   insulin aspart  0-15 Units Subcutaneous Q4H   lip balm   Topical BID   pantoprazole  40 mg Oral BID AC   polyethylene glycol  17 g Oral Daily   sodium chloride flush  3 mL Intravenous Q12H   sodium chloride flush  5 mL Intracatheter Q8H   Continuous:  methocarbamol (ROBAXIN) IV 1,000 mg (06/06/22 1552)   ondansetron (ZOFRAN) IV     piperacillin-tazobactam (ZOSYN)  IV     Assessment/Plan: 1) Sepsis secondary to multiple intra-abdominal abscesses/perforation possibly related to a Crohn's flare; improved with broad-spectrum antibiotics and percutaneous drainage. 2) Crohn's ileitis on Humira; she has been counseled to take the Humira every 2 weeks and follow-up with me in the office in a couple of weeks as well. 3) Bilateral hydroureteronephrosis left more than right with no acute kidney injury. 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    LOS: 5 days   Juanita Craver 06/06/2022, 3:32 PM

## 2022-06-06 NOTE — Discharge Instructions (Signed)
Flush each abdominal drain with 5 cc sterile saline once daily, record output and change bandage every 2-3 days; IR team will call pt with follow up appt date to evaluate drains- call [phone] with any questions

## 2022-06-07 LAB — BASIC METABOLIC PANEL
Anion gap: 7 (ref 5–15)
BUN: <5 mg/dL — ABNORMAL LOW (ref 6–20)
CO2: 26 mmol/L (ref 22–32)
Calcium: 8.6 mg/dL — ABNORMAL LOW (ref 8.9–10.3)
Chloride: 105 mmol/L (ref 98–111)
Creatinine, Ser: 0.64 mg/dL (ref 0.44–1.00)
GFR, Estimated: >60 mL/min (ref >60)
Glucose, Bld: 88 mg/dL (ref 70–99)
Potassium: 3.9 mmol/L (ref 3.5–5.1)
Sodium: 138 mmol/L (ref 135–145)

## 2022-06-07 LAB — CBC
HCT: 31.2 % — ABNORMAL LOW (ref 36.0–46.0)
Hemoglobin: 9.9 g/dL — ABNORMAL LOW (ref 12.0–15.0)
MCH: 27.4 pg (ref 26.0–34.0)
MCHC: 31.7 g/dL (ref 30.0–36.0)
MCV: 86.4 fL (ref 80.0–100.0)
Platelets: 365 10*3/uL (ref 150–400)
RBC: 3.61 MIL/uL — ABNORMAL LOW (ref 3.87–5.11)
RDW: 15.1 % (ref 11.5–15.5)
WBC: 8.3 10*3/uL (ref 4.0–10.5)
nRBC: 0 % (ref 0.0–0.2)

## 2022-06-07 LAB — GLUCOSE, CAPILLARY
Glucose-Capillary: 129 mg/dL — ABNORMAL HIGH (ref 70–99)
Glucose-Capillary: 130 mg/dL — ABNORMAL HIGH (ref 70–99)
Glucose-Capillary: 163 mg/dL — ABNORMAL HIGH (ref 70–99)
Glucose-Capillary: 71 mg/dL (ref 70–99)
Glucose-Capillary: 86 mg/dL (ref 70–99)
Glucose-Capillary: 88 mg/dL (ref 70–99)

## 2022-06-07 LAB — AEROBIC/ANAEROBIC CULTURE W GRAM STAIN (SURGICAL/DEEP WOUND)

## 2022-06-07 NOTE — Progress Notes (Signed)
PROGRESS NOTE    Sandra Foster  [id number] DOB: Nov 21, 1990 DOA: 06/01/2022 PCP: Jorge Ny, PA-C   Brief Narrative: 31 year old with past medical history significant for diabetes type 2, Crohn's disease on Humira and recurrent hospitalization due to Crohn's flare she has been treated with steroids presents with new onset right lower quadrant abdominal pain and right flank pain that is started 2 days prior to admission.  Patient was admitted with sepsis secondary to multiple intra-abdominal abscess with perforation likely related to Crohn's flare and bilateral hydroureteronephrosis, left more than right.  Patient has been getting IV Zosyn, general surgery and GI following.  She underwent CT-guided drain placement x 2 by IR.  Abscess culture with multiple species.   Assessment & Plan:   Principal Problem:   Crohn's disease of ileum, with abscess ([place]) Active Problems:   Insulin-requiring or dependent type II diabetes mellitus ([place])   Tobacco abuse   Uncontrolled NIDDM-2 with hyperglycemia   Iron deficiency anemia   Acute Crohn's disease ([place])   Gastroesophageal reflux disease   Sepsis ([place])   1-Sepsis secondary to multiple intra-abdominal abscess/perforation likely related to Crohn's flares: POA -Continue with IV antibiotics.  -Follow culture: growing gram positive cocci in pairs and chains. Multiples organism present.  -Patient underwent CT-guided abdominal abscess drain placement x 2 by IR on 06/02/2022 -Needs to follow up with Sx in 4-6 weeks.  -Diet advanced to Carb modified.  -needs to follow up with GI for treatment of Crohn's.  She report more pain today, plan to repeat cbc and monitor overnight.   2-Bilateral hydroureteronephrosis left > right Renal US; there is prominence of renal pelvis in both kidneys, more so on the left side without significant dilation of minor calluses.  Finding may be due to asked history of renal location of renal pelvis and partial ureteric  obstruction related to the inflammatory process in the lower abdomen and pelvis. Renal ultrasound and case discussed with Dr. Jeffie Pollock.  Patient will need a repeat renal ultrasound in 1 month.  If patient has left side flank pain we Could do Lasix renogram.  Patient denies left flank pain. Renal function stable.   Uncontrolled IDDM 2 in the setting of chronic steroid use for Crohn's A1c 9.6 On SSI.   Normocytic anemia: Hb stable.   Prior tobacco use: counseling  Hypokalemia; Replaced.  Constipation: Was started on laxative. Had BM.    Estimated body mass index is 21.71 kg/m as calculated from the following:   Height as of this encounter: 5\' 6"  (1.676 m).   Weight as of this encounter: 61 kg.   DVT prophylaxis: Lovenox Code Status: Full code Family Communication:care discussed with patient.  Disposition Plan:  Status is: Inpatient Remains inpatient appropriate because: management of intra-abdominal abscess.     Consultants:  Surgery GI IR  Procedures:    Antimicrobials:    Subjective: She report more right side abdominal pain today. One of the drain is  draining purulent material and was clear fluid before. .  She report she has been dragging her right leg since admission due to right side abdominal pain radiate to leg. No focal weakness on physical exam. She relates strength  in her leg is better, she was able to ambulate with her family yesterday.   She relates she has to turn and move to urinate and has sharp pain when she urinate, she think is refer pain from right lower quadrant pain. She has been able to urinate well and empty her  bladder fine.    Objective: Vitals:   06/06/22 0416 06/06/22 1343 06/06/22 2014 06/07/22 0637  BP: 112/68 119/75 120/71 114/72  Pulse: 63 75 87 81  Resp: 16 18 18 18   Temp: 98.5 F (36.9 C) 98.4 F (36.9 C) 98.9 F (37.2 C) 98.2 F (36.8 C)  TempSrc: Oral Oral Oral Oral  SpO2: 97% 99% 100% 99%  Weight:      Height:         Intake/Output Summary (Last 24 hours) at 06/07/2022 1200 Last data filed at 06/07/2022 1000 Gross per 24 hour  Intake 2109.09 ml  Output 950 ml  Net 1159.09 ml    Filed Weights   06/01/22 2200  Weight: 61 kg    Examination:  General exam: NAD Respiratory system: CTA Cardiovascular system: S 1, S 2 RRR Gastrointestinal system: BS present, soft, nt two drain in place.  Central nervous system: non focal.  Extremities: Symmetric 5 x 5 power.    Data Reviewed: I have personally reviewed following labs and imaging studies  CBC: Recent Labs  Lab 06/03/22 0512 06/04/22 0031 06/05/22 0401 06/06/22 0420 06/07/22 0819  WBC 8.3 8.1 7.5 6.3 8.3  HGB 9.2* 9.5* 9.1* 9.0* 9.9*  HCT 28.6* 29.4* 29.2* 27.7* 31.2*  MCV 87.5 85.2 86.6 85.2 86.4  PLT 222 259 280 285 [id number]    Basic Metabolic Panel: Recent Labs  Lab 06/02/22 0754 06/03/22 0512 06/04/22 0031 06/05/22 0401 06/06/22 0420 06/07/22 0819  NA 140 135 140 137 138 138  K 3.4* 4.1 3.9 4.0 3.4* 3.9  CL 111 103 108 102 105 105  CO2 25 24 25 26 25 26   GLUCOSE 119* 82 121* 107* 101* 88  BUN 7 <5* <5* <5* <5* <5*  CREATININE 0.51 0.46 0.52 0.63 0.53 0.64  CALCIUM 7.8* 8.2* 8.6* 8.5* 8.0* 8.6*  MG 1.8 1.9 1.9 1.9 1.8  --   PHOS 2.8 3.7 3.5 4.1 4.7*  --     GFR: Estimated Creatinine Clearance: 96.3 mL/min (by C-G formula based on SCr of 0.64 mg/dL). Liver Function Tests: Recent Labs  Lab 06/01/22 1529 06/02/22 0754 06/03/22 0512 06/04/22 0031 06/05/22 0401 06/06/22 0420  AST 8* 6*  --  9*  --   --   ALT 16 10  --  11  --   --   ALKPHOS 62 43  --  58  --   --   BILITOT 0.8 0.3  --  0.5  --   --   PROT 6.6 4.4*  --  5.7*  --   --   ALBUMIN 3.1* 1.9* 2.3* 2.4* 2.4* 2.3*    Recent Labs  Lab 06/01/22 1529  LIPASE 26    No results for input(s): "AMMONIA" in the last 168 hours. Coagulation Profile: Recent Labs  Lab 06/02/22 0754  INR 1.6*    Cardiac Enzymes: No results for input(s): "CKTOTAL",  "CKMB", "CKMBINDEX", "TROPONINI" in the last 168 hours. BNP (last 3 results) No results for input(s): "PROBNP" in the last 8760 hours. HbA1C: No results for input(s): "HGBA1C" in the last 72 hours. CBG: Recent Labs  Lab 06/06/22 1608 06/06/22 1956 06/06/22 2350 06/07/22 0402 06/07/22 0732  GLUCAP 125* 99 127* 86 88    Lipid Profile: No results for input(s): "CHOL", "HDL", "LDLCALC", "TRIG", "CHOLHDL", "LDLDIRECT" in the last 72 hours. Thyroid Function Tests: No results for input(s): "TSH", "T4TOTAL", "FREET4", "T3FREE", "THYROIDAB" in the last 72 hours. Anemia Panel: No results for input(s): "VITAMINB12", "FOLATE", "FERRITIN", "TIBC", "  IRON", "RETICCTPCT" in the last 72 hours. Sepsis Labs: Recent Labs  Lab 06/02/22 0754  LATICACIDVEN 1.0     Recent Results (from the past 240 hour(s))  Culture, blood (single)     Status: None   Collection Time: 06/01/22  7:26 PM   Specimen: BLOOD  Result Value Ref Range Status   Specimen Description   Final    BLOOD SITE NOT SPECIFIED Performed at [facility], [address] [place], [place] [address]    Special Requests   Final    BOTTLES DRAWN AEROBIC AND ANAEROBIC Blood Culture adequate volume Performed at [facility], [address], [place], [place] [address]    Culture   Final    NO GROWTH 5 DAYS Performed at [facility], [address] [place], [place] [address]    Report Status 06/06/2022 FINAL  Final  Aerobic/Anaerobic Culture w Gram Stain (surgical/deep wound)     Status: Abnormal (Preliminary result)   Collection Time: 06/02/22  5:10 PM   Specimen: Abdomen; Abscess  Result Value Ref Range Status   Specimen Description   Final    ABDOMEN Performed at [facility], [address], [place], [place] [address]    Special Requests   Final    NONE Performed at [facility], [address], [place], [place] [address]    Gram Stain    Final    ABUNDANT WBC PRESENT,BOTH PMN AND MONONUCLEAR FEW GRAM POSITIVE COCCI IN PAIRS AND CHAINS Performed at [facility], [address], [place], [place] [address]    Culture (A)  Final    MULTIPLE ORGANISMS PRESENT, NONE PREDOMINANT NO ANAEROBES ISOLATED; CULTURE IN PROGRESS FOR 5 DAYS    Report Status PENDING  Incomplete         Radiology Studies: US RENAL  Result Date: 06/06/2022 CLINICAL DATA:  Hydronephrosis EXAM: RENAL / URINARY TRACT ULTRASOUND COMPLETE COMPARISON:  CT done on 06/01/2022 FINDINGS: Right Kidney: Renal measurements: 12 x 4.5 by 5.7 cm = volume: 160.9 mL. Is mild dilation of renal pelvis measuring 1.3 cm in diameter without significant dilation of minor calyces. Ureter is not adequately visualized. There is no perinephric fluid collection. Left Kidney: Renal measurements: 15.6 x 4.9 x 5 cm = volume: 198.4 mL. There is prominence of left renal pelvis measuring 2.1 cm in diameter without significant dilation of minor calyces. Ureter is not adequately visualized. Bladder: Ureteral jets were observed on both sides. Other: None. IMPRESSION: There is prominence of renal pelvis in both kidneys, more so on the left side without significant dilation of minor calices. Findings may be due to extrarenal location of renal pelvis and partial ureteric obstruction related to the inflammatory process in lower abdomen and pelvis seen in the previous CT. Electronically Signed   By: Ernie Avena M.D.   On: 06/06/2022 19:23        Scheduled Meds:  enoxaparin (LOVENOX) injection  40 mg Subcutaneous Q24H   insulin aspart  0-15 Units Subcutaneous Q4H   lip balm   Topical BID   pantoprazole  40 mg Oral BID AC   polyethylene glycol  17 g Oral Daily   sodium chloride flush  3 mL Intravenous Q12H   sodium chloride flush  5 mL Intracatheter Q8H   Continuous Infusions:  methocarbamol (ROBAXIN) IV Stopped (06/06/22 2242)   ondansetron (ZOFRAN) IV      piperacillin-tazobactam (ZOSYN)  IV 12.5 mL/hr at 06/07/22 [phone]  LOS: 6 days    Time spent: 35 minutes    Felton Buczynski A Tanza Pellot, MD [facility]   If 7PM-7AM, please contact night-coverage [email or web address]  06/07/2022, 12:00 PM

## 2022-06-07 NOTE — Progress Notes (Signed)
Subjective: Had a BM yesterday. Complaining of a little more pain today around more lateral drain. She is concerned that this is looking a little more purulent as well.   Objective: Vital signs in last 24 hours: Temp:  [98.2 F (36.8 C)-98.9 F (37.2 C)] 98.2 F (36.8 C) (08/10 0637) Pulse Rate:  [75-87] 81 (08/10 0637) Resp:  [18] 18 (08/10 0637) BP: (114-120)/(71-75) 114/72 (08/10 0637) SpO2:  [99 %-100 %] 99 % (08/10 0637) Last BM Date : 06/06/22  Intake/Output from previous day: 08/09 0701 - 08/10 0700 In: 2384.5 [P.O.:1880; I.V.:13; IV Piggyback:481.5] Out: 40 [Drains:40] Intake/Output this shift: No intake/output data recorded.  PE: Abd: soft, tender around her drains.  +BS, one JP with purulent appearing fluid, one with SS fluid.  ND  Lab Results:  Recent Labs    06/05/22 0401 06/06/22 0420  WBC 7.5 6.3  HGB 9.1* 9.0*  HCT 29.2* 27.7*  PLT 280 285    BMET Recent Labs    06/06/22 0420 06/07/22 0819  NA 138 138  K 3.4* 3.9  CL 105 105  CO2 25 26  GLUCOSE 101* 88  BUN <5* <5*  CREATININE 0.53 0.64  CALCIUM 8.0* 8.6*    PT/INR No results for input(s): "LABPROT", "INR" in the last 72 hours.  CMP     Component Value Date/Time   NA 138 06/07/2022 0819   K 3.9 06/07/2022 0819   CL 105 06/07/2022 0819   CO2 26 06/07/2022 0819   GLUCOSE 88 06/07/2022 0819   BUN <5 (L) 06/07/2022 0819   CREATININE 0.64 06/07/2022 0819   CALCIUM 8.6 (L) 06/07/2022 0819   PROT 5.7 (L) 06/04/2022 0031   ALBUMIN 2.3 (L) 06/06/2022 0420   AST 9 (L) 06/04/2022 0031   ALT 11 06/04/2022 0031   ALKPHOS 58 06/04/2022 0031   BILITOT 0.5 06/04/2022 0031   GFRNONAA >60 06/07/2022 0819   GFRAA >60 08/28/2015 2150   Lipase     Component Value Date/Time   LIPASE 26 06/01/2022 1529       Studies/Results: US RENAL  Result Date: 06/06/2022 CLINICAL DATA:  Hydronephrosis EXAM: RENAL / URINARY TRACT ULTRASOUND COMPLETE COMPARISON:  CT done on 06/01/2022 FINDINGS:  Right Kidney: Renal measurements: 12 x 4.5 by 5.7 cm = volume: 160.9 mL. Is mild dilation of renal pelvis measuring 1.3 cm in diameter without significant dilation of minor calyces. Ureter is not adequately visualized. There is no perinephric fluid collection. Left Kidney: Renal measurements: 15.6 x 4.9 x 5 cm = volume: 198.4 mL. There is prominence of left renal pelvis measuring 2.1 cm in diameter without significant dilation of minor calyces. Ureter is not adequately visualized. Bladder: Ureteral jets were observed on both sides. Other: None. IMPRESSION: There is prominence of renal pelvis in both kidneys, more so on the left side without significant dilation of minor calices. Findings may be due to extrarenal location of renal pelvis and partial ureteric obstruction related to the inflammatory process in lower abdomen and pelvis seen in the previous CT. Electronically Signed   By: Ernie Avena M.D.   On: 06/06/2022 19:23    Anti-infectives: Anti-infectives (From admission, onward)    Start     Dose/Rate Route Frequency Ordered Stop   06/06/22 2000  piperacillin-tazobactam (ZOSYN) IVPB 3.375 g        3.375 g 12.5 mL/hr over 240 Minutes Intravenous Every 8 hours 06/06/22 1628 06/11/22 1959   06/02/22 2200  cefTRIAXone (ROCEPHIN) 2  g in sodium chloride 0.9 % 100 mL IVPB  Status:  Discontinued        2 g 200 mL/hr over 30 Minutes Intravenous Every 24 hours 06/02/22 0753 06/02/22 0820   06/02/22 0753  metroNIDAZOLE (FLAGYL) IVPB 500 mg  Status:  Discontinued        500 mg 100 mL/hr over 60 Minutes Intravenous Every 12 hours 06/02/22 0753 06/02/22 0820   06/02/22 0300  piperacillin-tazobactam (ZOSYN) IVPB 3.375 g  Status:  Discontinued        3.375 g 12.5 mL/hr over 240 Minutes Intravenous Every 8 hours 06/01/22 2209 06/06/22 1628   06/01/22 1900  cefTRIAXone (ROCEPHIN) 2 g in sodium chloride 0.9 % 100 mL IVPB        2 g 200 mL/hr over 30 Minutes Intravenous  Once 06/01/22 1855 06/01/22 1950    06/01/22 1900  metroNIDAZOLE (FLAGYL) IVPB 500 mg        500 mg 100 mL/hr over 60 Minutes Intravenous  Once 06/01/22 1855 06/01/22 2033        Assessment/Plan Crohn's disease with abscess - patient tolerating low fiber diet and having bowel function  - cont abx therapy, cx with multiple species, gram + cocci - cont both JP drains for now, further outpatient management of drains per GI - further crohn's management per GI.  Note from yesterday noted on likely transition off Humira to a different biologic as an outpatient - will have patient follow up in 4-6 weeks with surgeon to assure she is improving and no surgery indicated. If she develops fistula to drain may need to consider surgical intervention at some point, but would not recommend any acute intervention at this time  - stable for discharge from a surgical perspective with drains and close follow up   FEN - low fiber/IVFs per primary VTE - lovenox ID - zosyn  I reviewed Consultant GI notes, hospitalist notes, last 24 h vitals and pain scores, last 48 h intake and output, last 24 h labs and trends, and last 24 h imaging results.   LOS: 6 days    Juliet Rude , [facility] 06/07/2022, 9:50 AM Please see Amion for pager number during day hours 7:00am-4:30pm or 7:00am -11:30am on weekends

## 2022-06-08 LAB — BASIC METABOLIC PANEL
Anion gap: 7 (ref 5–15)
BUN: <5 mg/dL — ABNORMAL LOW (ref 6–20)
CO2: 26 mmol/L (ref 22–32)
Calcium: 8.8 mg/dL — ABNORMAL LOW (ref 8.9–10.3)
Chloride: 104 mmol/L (ref 98–111)
Creatinine, Ser: 0.64 mg/dL (ref 0.44–1.00)
GFR, Estimated: >60 mL/min (ref >60)
Glucose, Bld: 154 mg/dL — ABNORMAL HIGH (ref 70–99)
Potassium: 3.5 mmol/L (ref 3.5–5.1)
Sodium: 137 mmol/L (ref 135–145)

## 2022-06-08 LAB — CBC
HCT: 31.2 % — ABNORMAL LOW (ref 36.0–46.0)
Hemoglobin: 9.9 g/dL — ABNORMAL LOW (ref 12.0–15.0)
MCH: 27.2 pg (ref 26.0–34.0)
MCHC: 31.7 g/dL (ref 30.0–36.0)
MCV: 85.7 fL (ref 80.0–100.0)
Platelets: 353 10*3/uL (ref 150–400)
RBC: 3.64 MIL/uL — ABNORMAL LOW (ref 3.87–5.11)
RDW: 15.3 % (ref 11.5–15.5)
WBC: 11 10*3/uL — ABNORMAL HIGH (ref 4.0–10.5)
nRBC: 0 % (ref 0.0–0.2)

## 2022-06-08 LAB — GLUCOSE, CAPILLARY
Glucose-Capillary: 102 mg/dL — ABNORMAL HIGH (ref 70–99)
Glucose-Capillary: 148 mg/dL — ABNORMAL HIGH (ref 70–99)
Glucose-Capillary: 104 mg/dL — ABNORMAL HIGH (ref 70–99)

## 2022-06-08 MED ORDER — OXYCODONE HCL 5 MG PO TABS: 5.0000 mg | ORAL_TABLET | ORAL | 0 refills | Status: DC | PRN

## 2022-06-08 MED ORDER — SODIUM CHLORIDE 0.9% FLUSH: 5.0000 mL | Freq: Every day | INTRAVENOUS | 0 refills | Status: DC

## 2022-06-08 MED ORDER — AMOXICILLIN-POT CLAVULANATE 875-125 MG PO TABS: 1.0000 | ORAL_TABLET | Freq: Two times a day (BID) | ORAL | 0 refills | Status: AC

## 2022-06-08 MED ORDER — INSULIN ASPART 100 UNIT/ML FLEXPEN: PEN_INJECTOR | SUBCUTANEOUS | 11 refills | Status: DC

## 2022-06-08 MED ORDER — PANTOPRAZOLE SODIUM 40 MG PO TBEC: 40.0000 mg | DELAYED_RELEASE_TABLET | Freq: Every day | ORAL | 0 refills | Status: DC

## 2022-06-08 MED ORDER — POLYETHYLENE GLYCOL 3350 17 G PO PACK: 17.0000 g | PACK | Freq: Every day | ORAL | 0 refills | Status: DC

## 2022-06-08 MED ORDER — METHOCARBAMOL 750 MG PO TABS: 750.0000 mg | ORAL_TABLET | Freq: Three times a day (TID) | ORAL | 0 refills | Status: AC | PRN

## 2022-06-08 NOTE — Consult Note (Addendum)
[facility]    Date of Admission:  06/01/2022     Reason for Consult: Intra-abdominal infection     Referring Physician: Dr Sunnie Nielsen  Current antibiotics: Zosyn    ASSESSMENT:    31 y.o. female admitted with:  Multiple intra-abdominal abscesses/perforation: Likely due to Crohn's flare while on Humira.  Status post IR drainage of her abscesses x 2 on 06/02/22. Crohn's Disease: followed by GI outpatient and has been treated with Humira. Uncontrolled DM: A1c is 9.6. Sepsis: Due to #1.  Improved.   RECOMMENDATIONS:    I think would be okay to discharge as she is feeling well. Will change to Augmentin x 4 weeks and will need IR drain follow up with repeat imaging Follow up at [facility] on 06/22/22 at 2:30pm Will also check histoplasma urine Ag in the setting of abdominal perforation and Humira Please call as needed   Principal Problem:   Crohn's disease of ileum, with abscess ([facility]) Active Problems:   Insulin-requiring or dependent type II diabetes mellitus ([facility])   Tobacco abuse   Uncontrolled NIDDM-2 with hyperglycemia   Iron deficiency anemia   Acute Crohn's disease ([facility])   Gastroesophageal reflux disease   Sepsis ([facility])   MEDICATIONS:    Scheduled Meds:  enoxaparin (LOVENOX) injection  40 mg Subcutaneous Q24H   insulin aspart  0-15 Units Subcutaneous Q4H   lip balm   Topical BID   pantoprazole  40 mg Oral BID AC   polyethylene glycol  17 g Oral Daily   sodium chloride flush  3 mL Intravenous Q12H   sodium chloride flush  5 mL Intracatheter Q8H   Continuous Infusions:  methocarbamol (ROBAXIN) IV 1,000 mg (06/07/22 2244)   ondansetron (ZOFRAN) IV     piperacillin-tazobactam (ZOSYN)  IV 3.375 g (06/08/22 0426)   PRN Meds:.acetaminophen **OR** acetaminophen, alum & mag hydroxide-simeth, bisacodyl, diphenhydrAMINE, hydrALAZINE, HYDROmorphone (DILAUDID) injection, magic mouthwash, menthol-cetylpyridinium, methocarbamol (ROBAXIN) IV, metoprolol  tartrate, ondansetron (ZOFRAN) IV **OR** ondansetron (ZOFRAN) IV, ondansetron **OR** [DISCONTINUED] ondansetron (ZOFRAN) IV, oxyCODONE, phenol, prochlorperazine, senna-docusate, traZODone  HPI:    Sandra Foster is a 31 y.o. female with PMHx of DM2, Crohn's disease on Humira whom presented 06/01/22 with abdominal pain and sepsis due to multiple intra-abdominal abscesses with perforation suspected due to Crohn's flare.  She has been followed by GI and surgery during her admission and on Zosyn for several days. She underwent IR guided drain placement x 2 on 8/5.  These drains remain in place.  OVer the last 24 hrs there has been about 65 cc of drainage in both.  She is likely to go home soon.   Fluid in the tubing appears mostly serous in one of the drains and purulent appearing in another.  Gram stain from her drain cultures had GPC in pairs/chains.  Cultures had multiple organisms but none that predominated.  Her WBC went up a little bit yesterday to 11.0.   Past Medical History:  Diagnosis Date   Chlamydia    Crohn's disease (HCC)    Diabetes mellitus without complication (H[facility])    Fetal demise    hyperglycemic hyperosmolar nonketonic syndrome   Hernia    Pregnancy induced hypertension    Pyelonephritis     Social History   Tobacco Use   Smoking status: Former    Packs/day: 0.25    Types: Cigarettes   Smokeless tobacco: Former    Quit date: 06/10/2011  Vaping Use   Vaping Use: Some days  Substances: Nicotine  Substance Use Topics   Alcohol use: No   Drug use: No    Family History  Problem Relation Age of Onset   Hypertension Maternal Grandmother     Allergies  Allergen Reactions   Nsaids Other (See Comments)    Crohn's disease = NO NSAIDs    Review of Systems  All other systems reviewed and are negative. Excpet as noted above in HPI.   OBJECTIVE:   Blood pressure 106/66, pulse 90, temperature 99.3 F (37.4 C), temperature source Oral, resp. rate 18, height 5\' 6"   (1.676 m), weight 61 kg, SpO2 100 %. Body mass index is 21.71 kg/m.  Physical Exam Constitutional:      Appearance: Normal appearance.  HENT:     Head: Normocephalic and atraumatic.  Eyes:     Extraocular Movements: Extraocular movements intact.     Conjunctiva/sclera: Conjunctivae normal.  Pulmonary:     Effort: Pulmonary effort is normal. No respiratory distress.  Abdominal:     General: There is no distension.     Palpations: Abdomen is soft.     Tenderness: There is no abdominal tenderness.     Comments: IR drain x 2 in place.   Musculoskeletal:        General: Normal range of motion.     Cervical back: Normal range of motion and neck supple.  Skin:    General: Skin is warm and dry.     Findings: No rash.  Neurological:     General: No focal deficit present.     Mental Status: She is alert and oriented to person, place, and time.  Psychiatric:        Mood and Affect: Mood normal.        Behavior: Behavior normal.      Lab Results: Lab Results  Component Value Date   WBC 11.0 (H) 06/08/2022   HGB 9.9 (L) 06/08/2022   HCT 31.2 (L) 06/08/2022   MCV 85.7 06/08/2022   PLT 353 06/08/2022    Lab Results  Component Value Date   NA 137 06/08/2022   K 3.5 06/08/2022   CO2 26 06/08/2022   GLUCOSE 154 (H) 06/08/2022   BUN <5 (L) 06/08/2022   CREATININE 0.64 06/08/2022   CALCIUM 8.8 (L) 06/08/2022   GFRNONAA >60 06/08/2022   GFRAA >60 08/28/2015    Lab Results  Component Value Date   ALT 11 06/04/2022   AST 9 (L) 06/04/2022   ALKPHOS 58 06/04/2022   BILITOT 0.5 06/04/2022       Component Value Date/Time   CRP 23.5 (H) 06/02/2022 1706       Component Value Date/Time   ESRSEDRATE 31 (H) 04/22/2022 1133    I have reviewed the micro and lab results in Epic.  Imaging: 04/24/2022 RENAL  Result Date: 06/06/2022 CLINICAL DATA:  Hydronephrosis EXAM: RENAL / URINARY TRACT ULTRASOUND COMPLETE COMPARISON:  CT done on 06/01/2022 FINDINGS: Right Kidney: Renal  measurements: 12 x 4.5 by 5.7 cm = volume: 160.9 mL. Is mild dilation of renal pelvis measuring 1.3 cm in diameter without significant dilation of minor calyces. Ureter is not adequately visualized. There is no perinephric fluid collection. Left Kidney: Renal measurements: 15.6 x 4.9 x 5 cm = volume: 198.4 mL. There is prominence of left renal pelvis measuring 2.1 cm in diameter without significant dilation of minor calyces. Ureter is not adequately visualized. Bladder: Ureteral jets were observed on both sides. Other: None. IMPRESSION: There is prominence of renal pelvis  in both kidneys, more so on the left side without significant dilation of minor calices. Findings may be due to extrarenal location of renal pelvis and partial ureteric obstruction related to the inflammatory process in lower abdomen and pelvis seen in the previous CT. Electronically Signed   By: Ernie Avena M.D.   On: 06/06/2022 19:23     Imaging independently reviewed in Epic.  Vedia Coffer for Infectious Disease [facility] [phone] pager 06/08/2022, 10:15 AM

## 2022-06-08 NOTE — Progress Notes (Signed)
Patient was given discharge instructions, and all questions were answered. Patient flushed her drains before leaving and did not have any questions. Patient was stable for discharge and was taken to the main exit by wheelchair.

## 2022-06-08 NOTE — Discharge Summary (Signed)
Physician Discharge Summary   Patient: Sandra Foster MRN: [id number] DOB: 1991/07/14  Admit date:     06/01/2022  Discharge date: 06/08/22  Discharge Physician: Elmarie Shiley   PCP: Jorge Ny, PA-C   Recommendations at discharge:   Needs to follow up with ID, General surgery IR for further management of intra-abdominal abscess.  Needs to follow up with GI for further care of Crohn's Needs renal US in 1 months.     Discharge Diagnoses: Principal Problem:   Crohn's disease of ileum, with abscess ([place]) Active Problems:   Insulin-requiring or dependent type II diabetes mellitus ([place])   Tobacco abuse   Uncontrolled NIDDM-2 with hyperglycemia   Iron deficiency anemia   Acute Crohn's disease ([place])   Gastroesophageal reflux disease   Sepsis ([place])  Resolved Problems:   * No resolved hospital problems. *  Hospital Course: 31 year old with past medical history significant for diabetes type 2, Crohn's disease on Humira and recurrent hospitalization due to Crohn's flare she has been treated with steroids presents with new onset right lower quadrant abdominal pain and right flank pain that is started 2 days prior to admission.  Patient was admitted with sepsis secondary to multiple intra-abdominal abscess with perforation likely related to Crohn's flare and bilateral hydroureteronephrosis, left more than right.   Patient has been getting IV Zosyn, general surgery and GI following.  She underwent CT-guided drain placement x 2 by IR.  Abscess culture with multiple species.  Assessment and Plan: 1-Sepsis secondary to multiple intra-abdominal abscess/perforation likely related to Crohn's flares: POA -Treated with IV antibiotics. Zosyn -Follow culture: growing gram positive cocci in pairs and chains. Multiples organism present.  -Patient underwent CT-guided abdominal abscess drain placement x 2 by IR on 06/02/2022 -Needs to follow up with Sx in 4-6 weeks.  -Diet advanced to Carb  modified.  -needs to follow up with GI for treatment of Crohn's.   -ID consulted, appreciate assistance. Plan to discharge on Augmentin for 4 weeks. Close follow up with multiples consultants.   2-Bilateral hydroureteronephrosis left > right Renal US; there is prominence of renal pelvis in both kidneys, more so on the left side without significant dilation of minor calluses.  Finding may be due to asked history of renal location of renal pelvis and partial ureteric obstruction related to the inflammatory process in the lower abdomen and pelvis. Renal ultrasound and case discussed with Dr. Jeffie Pollock.  Patient will need a repeat renal ultrasound in 1 month.  If patient has left side flank pain we Could do Lasix renogram.  Patient denies left flank pain. Renal function stable.    Uncontrolled IDDM 2 in the setting of chronic steroid use for Crohn's A1c 9.6 On SSI.   resume home SSI.   Normocytic anemia: Hb stable.    Prior tobacco use: counseling   Hypokalemia; Replaced.  Constipation: Was started on laxative. Had BM.            Consultants: GI, Sx, ID Procedures performed: Patient underwent CT-guided abdominal abscess drain placement x 2 by IR on 06/02/2022 Disposition: Home Diet recommendation:  Discharge Diet Orders (From admission, onward)     Start     Ordered   06/08/22 0000  Diet - low sodium heart healthy        06/08/22 1133           Carb modified diet DISCHARGE MEDICATION: Allergies as of 06/08/2022       Reactions   Nsaids Other (See Comments)  Crohn's disease = NO NSAIDs        Medication List     STOP taking these medications    insulin glargine 100 UNIT/ML Solostar Pen Commonly known as: LANTUS   morphine 30 MG tablet Commonly known as: MSIR   nicotine 14 mg/24hr patch Commonly known as: NICODERM CQ - dosed in mg/24 hours   predniSONE 10 MG tablet Commonly known as: DELTASONE       TAKE these medications    acetaminophen 500 MG  tablet Commonly known as: TYLENOL Take 1 tablet (500 mg total) by mouth every 6 (six) hours as needed.   amoxicillin-clavulanate 875-125 MG tablet Commonly known as: AUGMENTIN Take 1 tablet by mouth 2 (two) times daily.   blood glucose meter kit and supplies Dispense based on patient and insurance preference. Use up to four times daily as directed. (FOR ICD-10 E10.9, E11.9).   carboxymethylcellulose 0.5 % Soln Commonly known as: REFRESH PLUS Place 1 drop into both eyes daily as needed (dry eyes).   Humira Pen 40 MG/0.4ML Pnkt Generic drug: Adalimumab Inject 40 mg into the skin every 14 (fourteen) days. Every other Tuesday   insulin aspart 100 UNIT/ML FlexPen Commonly known as: NOVOLOG CBG < 70: Implement Hypoglycemia Standing Orders and refer to Hypoglycemia Standing Orders sidebar report CBG 70 - 120: 0 units CBG 121 - 150: 2 units CBG 151 - 200: 3 units CBG 201 - 250: 5 units CBG 251 - 300: 8 units CBG 301 - 350: 11 units CBG 351 - 400: 15 units CBG > 400: call MD and obtain STAT lab verification   methocarbamol 750 MG tablet Commonly known as: Robaxin-750 Take 1 tablet (750 mg total) by mouth every 8 (eight) hours as needed for up to 10 days for muscle spasms.   Nexplanon 68 MG Impl implant Generic drug: etonogestrel 1 each by Subdermal route once.   oxyCODONE 5 MG immediate release tablet Commonly known as: Oxy IR/ROXICODONE Take 1-2 tablets (5-10 mg total) by mouth every 4 (four) hours as needed for severe pain.   pantoprazole 40 MG tablet Commonly known as: Protonix Take 1 tablet (40 mg total) by mouth daily.   Pen Needles 30G X 5 MM Misc 1 Pen by Does not apply route 4 (four) times daily -  before meals and at bedtime.   polyethylene glycol 17 g packet Commonly known as: MIRALAX / GLYCOLAX Take 17 g by mouth daily. Start taking on: June 09, 2022               Discharge Care Instructions  (From admission, onward)           Start     Ordered    06/08/22 0000  Discharge wound care:       Comments: See avs   06/08/22 1133            Follow-up Information     Michael Boston, MD Follow up on 07/09/2022.   Specialties: General Surgery, Colon and Rectal Surgery Why: 11:15am, Arrive 30 minutes prior to your appointment time at 10:45am, Please bring your insurance card and photo ID Contact information: [place] [id number] [phone]         Suzette Battiest, MD Follow up in 1 week(s).   Specialties: Interventional Radiology, Diagnostic Radiology, Radiology Why: Office will call you with a follow up appointment Contact information: [place] [address] [phone]  Juanita Craver, MD Follow up in 2 week(s).   Specialty: Gastroenterology Contact information: [address] [place] [address] [phone]                Discharge Exam: Filed Weights   06/01/22 2200  Weight: 61 kg   General; NAD  Condition at discharge: stable  The results of significant diagnostics from this hospitalization (including imaging, microbiology, ancillary and laboratory) are listed below for reference.   Imaging Studies: US RENAL  Result Date: 06/06/2022 CLINICAL DATA:  Hydronephrosis EXAM: RENAL / URINARY TRACT ULTRASOUND COMPLETE COMPARISON:  CT done on 06/01/2022 FINDINGS: Right Kidney: Renal measurements: 12 x 4.5 by 5.7 cm = volume: 160.9 mL. Is mild dilation of renal pelvis measuring 1.3 cm in diameter without significant dilation of minor calyces. Ureter is not adequately visualized. There is no perinephric fluid collection. Left Kidney: Renal measurements: 15.6 x 4.9 x 5 cm = volume: 198.4 mL. There is prominence of left renal pelvis measuring 2.1 cm in diameter without significant dilation of minor calyces. Ureter is not adequately visualized. Bladder: Ureteral jets were observed on both sides. Other: None. IMPRESSION: There is prominence  of renal pelvis in both kidneys, more so on the left side without significant dilation of minor calices. Findings may be due to extrarenal location of renal pelvis and partial ureteric obstruction related to the inflammatory process in lower abdomen and pelvis seen in the previous CT. Electronically Signed   By: Elmer Picker M.D.   On: 06/06/2022 19:23   CT GUIDED PERITONEAL/RETROPERITONEAL FLUID DRAIN BY PERC CATH  Result Date: 06/03/2022 INDICATION: 31 year old female history of Crohn's disease presenting with abdominal pain and sepsis with CT findings concerning for intra-abdominal abscess associated with the terminal ileum. EXAM: 1. CT PERC DRAIN PERITONEAL ABSCESS 2. CT PERC DRAIN PERITONEAL ABSCESS COMPARISON:  None Available. MEDICATIONS: The patient is currently admitted to the hospital and receiving intravenous antibiotics. The antibiotics were administered within an appropriate time frame prior to the initiation of the procedure. 4 mg Zofran, intravenous ANESTHESIA/SEDATION: Moderate (conscious) sedation was employed during this procedure. A total of Versed 2 mg and Fentanyl 100 mcg was administered intravenously. Moderate Sedation Time: 51 minutes. The patient's level of consciousness and vital signs were monitored continuously by radiology nursing throughout the procedure under my direct supervision. CONTRAST:  None COMPLICATIONS: None immediate. PROCEDURE: RADIATION DOSE REDUCTION: This exam was performed according to the departmental dose-optimization program which includes automated exposure control, adjustment of the mA and/or kV according to patient size and/or use of iterative reconstruction technique. Informed written consent was obtained from the patient after a discussion of the risks, benefits and alternatives to treatment. The patient was placed supine on the CT gantry and a pre procedural CT was performed re-demonstrating the known abscess/fluid collections within the location. The  procedure was planned. A timeout was performed prior to the initiation of the procedure. The right lower quadrant was prepped and draped in the usual sterile fashion. The overlying soft tissues were anesthetized with 1% lidocaine with epinephrine at the planned needle entry sites. Appropriate trajectories were planned with the use of a 22 gauge spinal needles. Two separate 18 gauge trocar needle were advanced into the abscess/fluid collections and short Amplatz super stiff wires wer coiled within the collections. Appropriate positioning was confirmed with a limited CT scan. The tracts were serially dilated allowing placement of 2, 10 French all-purpose drainage catheters. Appropriate positioning was confirmed with a limited postprocedural CT scan. Approximately  5 mL ml of purulent fluid was aspirated from the right inferior and lateral drain. Approximately 2 mL of purulence and serous fluid was aspirated from the more anterior and superior drain. The tubes were connected to a bulb suction and sutured in place. A dressing was placed. The patient tolerated the procedure well without immediate post procedural complication. IMPRESSION: Successful CT guided placement of 2, 10 French all purpose drain catheters into the right lower quadrant fluid collections with aspiration of small volume purulent fluid. Samples were sent to the laboratory as requested by the ordering clinical team. Ruthann Cancer, MD [facility] Radiology Electronically Signed   By: Ruthann Cancer M.D.   On: 06/03/2022 18:52   CT GUIDED PERITONEAL/RETROPERITONEAL FLUID DRAIN BY PERC CATH  Result Date: 06/03/2022 INDICATION: 31 year old female history of Crohn's disease presenting with abdominal pain and sepsis with CT findings concerning for intra-abdominal abscess associated with the terminal ileum. EXAM: 1. CT PERC DRAIN PERITONEAL ABSCESS 2. CT PERC DRAIN PERITONEAL ABSCESS COMPARISON:  None Available.  MEDICATIONS: The patient is currently admitted to the hospital and receiving intravenous antibiotics. The antibiotics were administered within an appropriate time frame prior to the initiation of the procedure. 4 mg Zofran, intravenous ANESTHESIA/SEDATION: Moderate (conscious) sedation was employed during this procedure. A total of Versed 2 mg and Fentanyl 100 mcg was administered intravenously. Moderate Sedation Time: 51 minutes. The patient's level of consciousness and vital signs were monitored continuously by radiology nursing throughout the procedure under my direct supervision. CONTRAST:  None COMPLICATIONS: None immediate. PROCEDURE: RADIATION DOSE REDUCTION: This exam was performed according to the departmental dose-optimization program which includes automated exposure control, adjustment of the mA and/or kV according to patient size and/or use of iterative reconstruction technique. Informed written consent was obtained from the patient after a discussion of the risks, benefits and alternatives to treatment. The patient was placed supine on the CT gantry and a pre procedural CT was performed re-demonstrating the known abscess/fluid collections within the location. The procedure was planned. A timeout was performed prior to the initiation of the procedure. The right lower quadrant was prepped and draped in the usual sterile fashion. The overlying soft tissues were anesthetized with 1% lidocaine with epinephrine at the planned needle entry sites. Appropriate trajectories were planned with the use of a 22 gauge spinal needles. Two separate 18 gauge trocar needle were advanced into the abscess/fluid collections and short Amplatz super stiff wires wer coiled within the collections. Appropriate positioning was confirmed with a limited CT scan. The tracts were serially dilated allowing placement of 2, 10 French all-purpose drainage catheters. Appropriate positioning was confirmed with a limited postprocedural CT  scan. Approximately 5 mL ml of purulent fluid was aspirated from the right inferior and lateral drain. Approximately 2 mL of purulence and serous fluid was aspirated from the more anterior and superior drain. The tubes were connected to a bulb suction and sutured in place. A dressing was placed. The patient tolerated the procedure well without immediate post procedural complication. IMPRESSION: Successful CT guided placement of 2, 10 French all purpose drain catheters into the right lower quadrant fluid collections with aspiration of small volume purulent fluid. Samples were sent to the laboratory as requested by the ordering clinical team. Ruthann Cancer, MD [facility] Radiology Electronically Signed   By: Ruthann Cancer M.D.   On: 06/03/2022 18:52   CT ABDOMEN PELVIS W CONTRAST  Result Date: 06/01/2022 CLINICAL DATA:  Right lower  quadrant pain for 3 days, history of Crohn's disease EXAM: CT ABDOMEN AND PELVIS WITH CONTRAST TECHNIQUE: Multidetector CT imaging of the abdomen and pelvis was performed using the standard protocol following bolus administration of intravenous contrast. RADIATION DOSE REDUCTION: This exam was performed according to the departmental dose-optimization program which includes automated exposure control, adjustment of the mA and/or kV according to patient size and/or use of iterative reconstruction technique. CONTRAST:  120m OMNIPAQUE IOHEXOL 300 MG/ML  SOLN COMPARISON:  05/03/2022 FINDINGS: Lower chest: No acute abnormality. Hepatobiliary: No focal liver abnormality is seen. No gallstones, gallbladder wall thickening, or biliary dilatation. Pancreas: Unremarkable. No pancreatic ductal dilatation or surrounding inflammatory changes. Spleen: Normal in size without focal abnormality. Adrenals/Urinary Tract: Adrenal glands are within normal limits. Kidneys demonstrate a normal enhancement pattern bilaterally. The bladder is incompletely  distended. There is however significant distension of the collecting systems and ureters bilaterally likely related to local compression by inflammatory changes in the pelvis. These changes are new from the prior exam. No calculi are identified. Stomach/Bowel: Mild fecal material is noted within the colon. The cecum and distal terminal ileum demonstrates significant inflammatory change much worse than that noted on the prior exam consistent with the known history of Crohn's. The appendix is within normal limits. The distal most aspect of the small bowel is mildly dilated related to the inflammatory changes. A few small air-fluid collections are identified 1 seen on image number 55 of series 2 measuring 2.2 cm and a second measuring 2.3 cm on image number 62 of series 2. These are highly suspicious for focal abscesses and new from the prior exam. An additional area measuring up to 3 cm best seen on image number 64 of series 2 and image number 43 of series 5 is also suspicious for abscess. More proximal small bowel is within normal limits. The stomach is unremarkable. Vascular/Lymphatic: No significant vascular findings are present. No enlarged abdominal or pelvic lymph nodes. Reproductive: Uterus and bilateral adnexa are unremarkable. Other: No hernia is noted.  Free fluid is noted within the pelvis. Musculoskeletal: No acute or significant osseous findings. IMPRESSION: Significant progression of inflammatory change in the right lower quadrant when compared with the prior exam. Multiple small enhancing air-fluid collections are identified suspicious for abscesses and likely related to prior perforation. Free pelvic fluid is noted as well. These changes are consistent with the given clinical history of Crohn's and likely represent an acute flare with perforation and abscess formation. Bilateral hydronephrosis and hydroureter worse on the left than the right likely related to local compression related to the  inflammatory changes. Electronically Signed   By: MInez CatalinaM.D.   On: 06/01/2022 20:19   DG Chest Port 1 View  Result Date: 05/22/2022 CLINICAL DATA:  Two days of lower abdominal pain, history of Crohn's disease EXAM: PORTABLE CHEST 1 VIEW COMPARISON:  Radiographs 06/02/2012 FINDINGS: The heart size and mediastinal contours are within normal limits. Both lungs are clear. The visualized skeletal structures are unremarkable. No free air under the diaphragms on semi-erect AP radiograph. IMPRESSION: No acute abnormality. Electronically Signed   By: TPlacido SouM.D.   On: 05/22/2022 14:41    Microbiology: Results for orders placed or performed during the hospital encounter of 06/01/22  Culture, blood (single)     Status: None   Collection Time: 06/01/22  7:26 PM   Specimen: BLOOD  Result Value Ref Range Status   Specimen Description   Final    BLOOD SITE NOT SPECIFIED Performed  at [facility], [place] [address] [place], [place] [address]    Special Requests   Final    BOTTLES DRAWN AEROBIC AND ANAEROBIC Blood Culture adequate volume Performed at [place] [address] [place], [place] [address]    Culture   Final    NO GROWTH 5 DAYS Performed at [facility], [place] [address] [place], [place] [address]    Report Status 06/06/2022 FINAL  Final  Aerobic/Anaerobic Culture w Gram Stain (surgical/deep wound)     Status: Abnormal   Collection Time: 06/02/22  5:10 PM   Specimen: Abdomen; Abscess  Result Value Ref Range Status   Specimen Description   Final    ABDOMEN Performed at [place] [address] [place], [place] [address]    Special Requests   Final    NONE Performed at [facility], [place] [address], [place], [place] [address]    Gram Stain   Final    ABUNDANT WBC PRESENT,BOTH PMN AND MONONUCLEAR FEW GRAM POSITIVE COCCI IN PAIRS AND CHAINS    Culture (A)  Final    MULTIPLE  ORGANISMS PRESENT, NONE PREDOMINANT NO ANAEROBES ISOLATED Performed at [facility], [place] [address], [place],  [address]    Report Status 06/07/2022 FINAL  Final    Labs: CBC: Recent Labs  Lab 06/04/22 0031 06/05/22 0401 06/06/22 0420 06/07/22 0819 06/08/22 0828  WBC 8.1 7.5 6.3 8.3 11.0*  HGB 9.5* 9.1* 9.0* 9.9* 9.9*  HCT 29.4* 29.2* 27.7* 31.2* 31.2*  MCV 85.2 86.6 85.2 86.4 85.7  PLT 259 280 285 365 600   Basic Metabolic Panel: Recent Labs  Lab 06/02/22 0754 06/03/22 0512 06/04/22 0031 06/05/22 0401 06/06/22 0420 06/07/22 0819 06/08/22 0828  NA 140 135 140 137 138 138 137  K 3.4* 4.1 3.9 4.0 3.4* 3.9 3.5  CL 111 103 108 102 105 105 104  CO2 _0 GLUCOSE 119* 82 121* 107* 101* 88 154*  BUN 7 <5* <5* <5* <5* <5* <5*  CREATININE 0.51 0.46 0.52 0.63 0.53 0.64 0.64  CALCIUM 7.8* 8.2* 8.6* 8.5* 8.0* 8.6* 8.8*  MG 1.8 1.9 1.9 1.9 1.8  --   --   PHOS 2.8 3.7 3.5 4.1 4.7*  --   --    Liver Function Tests: Recent Labs  Lab 06/01/22 1529 06/02/22 0754 06/03/22 0512 06/04/22 0031 06/05/22 0401 06/06/22 0420  AST 8* 6*  --  9*  --   --   ALT 16 10  --  11  --   --   ALKPHOS 62 43  --  58  --   --   BILITOT 0.8 0.3  --  0.5  --   --   PROT 6.6 4.4*  --  5.7*  --   --   ALBUMIN 3.1* 1.9* 2.3* 2.4* 2.4* 2.3*   CBG: Recent Labs  Lab 06/07/22 1620 06/07/22 1949 06/07/22 2347 06/08/22 0416 06/08/22 0726  GLUCAP 71 163* 129* 102* 148*    Discharge time spent: greater than 30 minutes.  Signed: Elmarie Shiley, MD [facility] 06/08/2022

## 2022-06-09 LAB — ADALIMUMAB LEVEL AND ANTIBODY
Adalimumab Drug Level: 3.2 ug/mL
Anti-Adalimumab Antibody: <25 ng/mL

## 2022-06-11 ENCOUNTER — Emergency Department (HOSPITAL_COMMUNITY): Payer: Medicaid Other

## 2022-06-11 ENCOUNTER — Other Ambulatory Visit: Payer: Self-pay

## 2022-06-11 ENCOUNTER — Other Ambulatory Visit: Payer: Self-pay | Admitting: Surgery

## 2022-06-11 ENCOUNTER — Inpatient Hospital Stay (HOSPITAL_COMMUNITY)
Admission: EM | Admit: 2022-06-11 | Discharge: 2022-06-16 | DRG: 872 | Disposition: A | Discharge status: Home / Self Care | Payer: Medicaid Other | Attending: Family Medicine | Admitting: Family Medicine

## 2022-06-11 ENCOUNTER — Encounter (HOSPITAL_COMMUNITY): Payer: Self-pay | Admitting: Emergency Medicine

## 2022-06-11 DIAGNOSIS — A419 Sepsis, unspecified organism: Secondary | ICD-10-CM | POA: Diagnosis present

## 2022-06-11 DIAGNOSIS — M79604 Pain in right leg: Secondary | ICD-10-CM

## 2022-06-11 DIAGNOSIS — K50014 Crohn's disease of small intestine with abscess: Secondary | ICD-10-CM | POA: Diagnosis present

## 2022-06-11 DIAGNOSIS — R188 Other ascites: Secondary | ICD-10-CM | POA: Diagnosis present

## 2022-06-11 DIAGNOSIS — Z6820 Body mass index (BMI) 20.0-20.9, adult: Secondary | ICD-10-CM | POA: Diagnosis not present

## 2022-06-11 DIAGNOSIS — Z79620 Long term (current) use of immunosuppressive biologic: Secondary | ICD-10-CM

## 2022-06-11 DIAGNOSIS — D75838 Other thrombocytosis: Secondary | ICD-10-CM | POA: Diagnosis present

## 2022-06-11 DIAGNOSIS — D75839 Thrombocytosis, unspecified: Secondary | ICD-10-CM

## 2022-06-11 DIAGNOSIS — Z79899 Other long term (current) drug therapy: Secondary | ICD-10-CM | POA: Diagnosis not present

## 2022-06-11 DIAGNOSIS — Z794 Long term (current) use of insulin: Secondary | ICD-10-CM

## 2022-06-11 DIAGNOSIS — Z8249 Family history of ischemic heart disease and other diseases of the circulatory system: Secondary | ICD-10-CM | POA: Diagnosis not present

## 2022-06-11 DIAGNOSIS — N1330 Unspecified hydronephrosis: Secondary | ICD-10-CM | POA: Diagnosis present

## 2022-06-11 DIAGNOSIS — E876 Hypokalemia: Secondary | ICD-10-CM | POA: Diagnosis present

## 2022-06-11 DIAGNOSIS — R52 Pain, unspecified: Secondary | ICD-10-CM | POA: Diagnosis not present

## 2022-06-11 DIAGNOSIS — Z87891 Personal history of nicotine dependence: Secondary | ICD-10-CM | POA: Diagnosis not present

## 2022-06-11 DIAGNOSIS — K50012 Crohn's disease of small intestine with intestinal obstruction: Secondary | ICD-10-CM | POA: Diagnosis present

## 2022-06-11 DIAGNOSIS — K50914 Crohn's disease, unspecified, with abscess: Secondary | ICD-10-CM | POA: Diagnosis not present

## 2022-06-11 DIAGNOSIS — E119 Type 2 diabetes mellitus without complications: Secondary | ICD-10-CM

## 2022-06-11 DIAGNOSIS — K50919 Crohn's disease, unspecified, with unspecified complications: Principal | ICD-10-CM

## 2022-06-11 DIAGNOSIS — E440 Moderate protein-calorie malnutrition: Secondary | ICD-10-CM | POA: Diagnosis present

## 2022-06-11 DIAGNOSIS — R509 Fever, unspecified: Secondary | ICD-10-CM | POA: Diagnosis present

## 2022-06-11 DIAGNOSIS — K56609 Unspecified intestinal obstruction, unspecified as to partial versus complete obstruction: Secondary | ICD-10-CM

## 2022-06-11 LAB — COMPREHENSIVE METABOLIC PANEL
ALT: 14 U/L (ref 0–44)
AST: 11 U/L — ABNORMAL LOW (ref 15–41)
Albumin: 3 g/dL — ABNORMAL LOW (ref 3.5–5.0)
Alkaline Phosphatase: 59 U/L (ref 38–126)
Anion gap: 7 (ref 5–15)
BUN: <5 mg/dL — ABNORMAL LOW (ref 6–20)
CO2: 24 mmol/L (ref 22–32)
Calcium: 9.1 mg/dL (ref 8.9–10.3)
Chloride: 107 mmol/L (ref 98–111)
Creatinine, Ser: 0.49 mg/dL (ref 0.44–1.00)
GFR, Estimated: >60 mL/min (ref >60)
Glucose, Bld: 120 mg/dL — ABNORMAL HIGH (ref 70–99)
Potassium: 3.4 mmol/L — ABNORMAL LOW (ref 3.5–5.1)
Sodium: 138 mmol/L (ref 135–145)
Total Bilirubin: 0.4 mg/dL (ref 0.3–1.2)
Total Protein: 7.1 g/dL (ref 6.5–8.1)

## 2022-06-11 LAB — PROTIME-INR
INR: 1.2 (ref 0.8–1.2)
Prothrombin Time: 15.1 seconds (ref 11.4–15.2)

## 2022-06-11 LAB — I-STAT CHEM 8, ED
BUN: <3 mg/dL — ABNORMAL LOW (ref 6–20)
Calcium, Ion: 1.14 mmol/L — ABNORMAL LOW (ref 1.15–1.40)
Chloride: 102 mmol/L (ref 98–111)
Creatinine, Ser: 0.4 mg/dL — ABNORMAL LOW (ref 0.44–1.00)
Glucose, Bld: 114 mg/dL — ABNORMAL HIGH (ref 70–99)
HCT: 36 % (ref 36.0–46.0)
Hemoglobin: 12.2 g/dL (ref 12.0–15.0)
Potassium: 4.4 mmol/L (ref 3.5–5.1)
Sodium: 137 mmol/L (ref 135–145)
TCO2: 25 mmol/L (ref 22–32)

## 2022-06-11 LAB — URINALYSIS, ROUTINE W REFLEX MICROSCOPIC
Bilirubin Urine: NEGATIVE
Glucose, UA: NEGATIVE mg/dL
Ketones, ur: NEGATIVE mg/dL
Leukocytes,Ua: NEGATIVE
Nitrite: NEGATIVE
Protein, ur: NEGATIVE mg/dL
RBC / HPF: >50 RBC/hpf — ABNORMAL HIGH (ref 0–5)
Specific Gravity, Urine: 1.017 (ref 1.005–1.030)
pH: 6 (ref 5.0–8.0)

## 2022-06-11 LAB — CBC WITH DIFFERENTIAL/PLATELET
Abs Immature Granulocytes: 0.05 10*3/uL (ref 0.00–0.07)
Basophils Absolute: 0 10*3/uL (ref 0.0–0.1)
Basophils Relative: 0 %
Eosinophils Absolute: 0.1 10*3/uL (ref 0.0–0.5)
Eosinophils Relative: 1 %
HCT: 34.9 % — ABNORMAL LOW (ref 36.0–46.0)
Hemoglobin: 11.4 g/dL — ABNORMAL LOW (ref 12.0–15.0)
Immature Granulocytes: 0 %
Lymphocytes Relative: 9 %
Lymphs Abs: 1.2 10*3/uL (ref 0.7–4.0)
MCH: 27.7 pg (ref 26.0–34.0)
MCHC: 32.7 g/dL (ref 30.0–36.0)
MCV: 84.9 fL (ref 80.0–100.0)
Monocytes Absolute: 0.9 10*3/uL (ref 0.1–1.0)
Monocytes Relative: 7 %
Neutro Abs: 11 10*3/uL — ABNORMAL HIGH (ref 1.7–7.7)
Neutrophils Relative %: 83 %
Platelets: 515 10*3/uL — ABNORMAL HIGH (ref 150–400)
RBC: 4.11 MIL/uL (ref 3.87–5.11)
RDW: 15.8 % — ABNORMAL HIGH (ref 11.5–15.5)
WBC: 13.1 10*3/uL — ABNORMAL HIGH (ref 4.0–10.5)
nRBC: 0 % (ref 0.0–0.2)

## 2022-06-11 LAB — I-STAT BETA HCG BLOOD, ED (MC, WL, AP ONLY)
I-stat hCG, quantitative: 5.7 m[IU]/mL — ABNORMAL HIGH (ref <5)
I-stat hCG, quantitative: <5 m[IU]/mL (ref <5)

## 2022-06-11 LAB — LACTIC ACID, PLASMA
Lactic Acid, Venous: 1.4 mmol/L (ref 0.5–1.9)
Lactic Acid, Venous: 0.9 mmol/L (ref 0.5–1.9)

## 2022-06-11 LAB — APTT: aPTT: 37 seconds — ABNORMAL HIGH (ref 24–36)

## 2022-06-11 LAB — PREGNANCY, URINE: Preg Test, Ur: NEGATIVE

## 2022-06-11 LAB — HCG, SERUM, QUALITATIVE: Preg, Serum: NEGATIVE

## 2022-06-11 LAB — GLUCOSE, CAPILLARY: Glucose-Capillary: 95 mg/dL (ref 70–99)

## 2022-06-11 MED ORDER — MORPHINE SULFATE (PF) 2 MG/ML IV SOLN
1.0000 mg | INTRAVENOUS | Status: DC | PRN
  Administered 2022-06-11 – 2022-06-12 (×2): 1 mg via INTRAVENOUS
  Filled 2022-06-11 (×2): qty 1

## 2022-06-11 MED ORDER — INSULIN ASPART 100 UNIT/ML IJ SOLN
0.0000 [IU] | Freq: Three times a day (TID) | INTRAMUSCULAR | Status: DC
  Administered 2022-06-13 (×3): 2 [IU] via SUBCUTANEOUS
  Administered 2022-06-14: 3 [IU] via SUBCUTANEOUS
  Administered 2022-06-14: 5 [IU] via SUBCUTANEOUS
  Administered 2022-06-15 (×2): 3 [IU] via SUBCUTANEOUS
  Administered 2022-06-15: 1 [IU] via SUBCUTANEOUS
  Administered 2022-06-16: 3 [IU] via SUBCUTANEOUS
  Administered 2022-06-16: 1 [IU] via SUBCUTANEOUS

## 2022-06-11 MED ORDER — IOHEXOL 350 MG/ML SOLN
80.0000 mL | Freq: Once | INTRAVENOUS | Status: AC | PRN
Start: 2022-06-11 — End: 2022-06-11
  Administered 2022-06-11: 80 mL via INTRAVENOUS

## 2022-06-11 MED ORDER — PANTOPRAZOLE SODIUM 40 MG PO TBEC
40.0000 mg | DELAYED_RELEASE_TABLET | Freq: Every day | ORAL | Status: DC
  Administered 2022-06-12 – 2022-06-16 (×5): 40 mg via ORAL
  Filled 2022-06-11 (×5): qty 1

## 2022-06-11 MED ORDER — MELATONIN 3 MG PO TABS
3.0000 mg | ORAL_TABLET | Freq: Once | ORAL | Status: AC
Start: 2022-06-11 — End: 2022-06-11
  Administered 2022-06-11: 3 mg via ORAL
  Filled 2022-06-11: qty 1

## 2022-06-11 MED ORDER — ACETAMINOPHEN 500 MG PO TABS
500.0000 mg | ORAL_TABLET | Freq: Four times a day (QID) | ORAL | Status: DC | PRN
Start: 2022-06-11 — End: 2022-06-12
  Administered 2022-06-12: 500 mg via ORAL
  Filled 2022-06-11: qty 1

## 2022-06-11 MED ORDER — LACTATED RINGERS IV BOLUS
1000.0000 mL | Freq: Once | INTRAVENOUS | Status: AC
  Administered 2022-06-11: 1000 mL via INTRAVENOUS

## 2022-06-11 MED ORDER — SODIUM CHLORIDE 0.9 % IV SOLN: INTRAVENOUS | Status: AC

## 2022-06-11 MED ORDER — HYDROMORPHONE HCL 1 MG/ML IJ SOLN
1.0000 mg | Freq: Once | INTRAMUSCULAR | Status: AC
  Administered 2022-06-11: 1 mg via INTRAVENOUS
  Filled 2022-06-11: qty 1

## 2022-06-11 MED ORDER — TRAZODONE HCL 50 MG PO TABS
50.0000 mg | ORAL_TABLET | Freq: Once | ORAL | Status: AC | PRN
  Administered 2022-06-12: 50 mg via ORAL
  Filled 2022-06-11: qty 1

## 2022-06-11 MED ORDER — HYDROMORPHONE HCL 1 MG/ML IJ SOLN
1.0000 mg | INTRAMUSCULAR | Status: DC | PRN
  Administered 2022-06-11 – 2022-06-16 (×26): 1 mg via INTRAVENOUS
  Filled 2022-06-11 (×26): qty 1

## 2022-06-11 MED ORDER — ACETAMINOPHEN 325 MG PO TABS
650.0000 mg | ORAL_TABLET | Freq: Once | ORAL | Status: AC
  Administered 2022-06-11: 650 mg via ORAL
  Filled 2022-06-11: qty 2

## 2022-06-11 MED ORDER — ACETAMINOPHEN 500 MG PO TABS: 500.0000 mg | ORAL_TABLET | Freq: Four times a day (QID) | ORAL | Status: DC | PRN

## 2022-06-11 NOTE — ED Notes (Signed)
I will get patient vitals when she return from CT

## 2022-06-11 NOTE — Progress Notes (Signed)
Pharmacy Antibiotic Note  Sandra Foster is a 31 y.o. female admitted on 06/11/2022 with  IAI .  Pharmacy has been consulted for zosyn dosing.  Plan: Zosyn 3.375g IV q8h (4 hour infusion).  Height: 5\' 6"  (167.6 cm) Weight: 58.1 kg (128 lb) IBW/kg (Calculated) : 59.3  Temp (24hrs), Avg:101 F (38.3 C), Min:99.8 F (37.7 C), Max:102.2 F (39 C)  Recent Labs  Lab 06/05/22 0401 06/06/22 0420 06/07/22 0819 06/08/22 0828 06/11/22 1335 06/11/22 1352 06/11/22 1650  WBC 7.5 6.3 8.3 11.0* 13.1*  --   --   CREATININE 0.63 0.53 0.64 0.64 0.49 0.40*  --   LATICACIDVEN  --   --   --   --  1.4  --  0.9    Estimated Creatinine Clearance: 94.3 mL/min (A) (by C-G formula based on SCr of 0.4 mg/dL (L)).    Allergies  Allergen Reactions   Nsaids Other (See Comments)    Crohn's disease = NO NSAIDs      Thank you for allowing pharmacy to be a part of this patient's care.  06/13/22 06/11/2022 6:19 PM

## 2022-06-11 NOTE — ED Provider Notes (Signed)
Signout from Dr. Criss Alvine.  31 year old with diabetes Crohn's complaining of worsening abdominal pain.  Just discharged few days ago after having 2 intra-abdominal drains placed for abscesses.  Pain is worse since today.  She is pending CT abdomen and pelvis.  Disposition per results of CT scan and response to treatment. Physical Exam  BP 109/69   Pulse 96   Temp 99.8 F (37.7 C) (Oral)   Resp 19   Ht 5\' 6"  (1.676 m)   Wt 58.1 kg   SpO2 100%   BMI 20.66 kg/m   Physical Exam  Procedures  Procedures  ED Course / MDM    Medical Decision Making Amount and/or Complexity of Data Reviewed Labs: ordered. Radiology: ordered. ECG/medicine tests: ordered.  Risk OTC drugs. Prescription drug management. Decision regarding hospitalization.   CT back with worsening inflammatory changes of small bowel.  Drains appear appropriately placed.  Patient now has a temp of 102.2.  She is already been cultured up. Ordered more pain medication and fluids, started Zosyn. Reviewed scan with general surgery Dr. who will put her on the list to be seen by surgery in the morning. Placed page  to GI Dr Gerrit Friends.   6:50 PM discussed with Dr. Loreta Ave [facility] who will evaluate patient for admission.       Cyndia Bent, MD 06/12/22 [phone]

## 2022-06-11 NOTE — ED Provider Notes (Signed)
[facility] DEPT Provider Note   CSN: [id number] Arrival date & time: 06/11/22  1249     History  Chief Complaint  Patient presents with   Abdominal Pain   Post-op Problem    Sandra Foster is a 31 y.o. female.  HPI 31 year old female with a history of type 2 diabetes and Crohn disease presents with worsening abdominal pain.  She was discharged on 8/11 after being admitted and having 2 intra-abdominal drains placed for abscesses.  Pain is severely worse this morning.  Minimal p.o. intake besides a little bit of coffee.  She is also felt warm and had a low-grade temperature.  She reports that one of the drains is draining but the other one has stopped.  She feels like her right lower abdomen is swollen and she is having a hard time moving her leg and feels weak due to the area of interest.  She reports moving her leg and breathing both make the abdominal pain worse.  Home Medications Prior to Admission medications   Medication Sig Start Date End Date Taking? Authorizing Provider  acetaminophen (TYLENOL) 500 MG tablet Take 1 tablet (500 mg total) by mouth every 6 (six) hours as needed. 05/22/22   Varney Biles, MD  Adalimumab (HUMIRA PEN) 40 MG/0.4ML PNKT Inject 40 mg into the skin every 14 (fourteen) days. Every other Tuesday    [provider]  amoxicillin-clavulanate (AUGMENTIN) 875-125 MG tablet Take 1 tablet by mouth 2 (two) times daily. 06/08/22 07/08/22  Regalado, Jerald Kief A, MD  blood glucose meter kit and supplies Dispense based on patient and insurance preference. Use up to four times daily as directed. (FOR ICD-10 E10.9, E11.9). 04/26/22   Mercy Riding, MD  carboxymethylcellulose (REFRESH PLUS) 0.5 % SOLN Place 1 drop into both eyes daily as needed (dry eyes).    [provider]  etonogestrel (NEXPLANON) 68 MG IMPL implant 1 each by Subdermal route once.    [provider]  insulin aspart (NOVOLOG) 100 UNIT/ML FlexPen CBG <  70: Implement Hypoglycemia Standing Orders and refer to Hypoglycemia Standing Orders sidebar report CBG 70 - 120: 0 units CBG 121 - 150: 2 units CBG 151 - 200: 3 units CBG 201 - 250: 5 units CBG 251 - 300: 8 units CBG 301 - 350: 11 units CBG 351 - 400: 15 units CBG > 400: call MD and obtain STAT lab verification 06/08/22   Regalado, Belkys A, MD  Insulin Pen Needle (PEN NEEDLES) 30G X 5 MM MISC 1 Pen by Does not apply route 4 (four) times daily -  before meals and at bedtime. 04/26/22   Mercy Riding, MD  methocarbamol (ROBAXIN-750) 750 MG tablet Take 1 tablet (750 mg total) by mouth every 8 (eight) hours as needed for up to 10 days for muscle spasms. 06/08/22 06/18/22  Regalado, Belkys A, MD  oxyCODONE (OXY IR/ROXICODONE) 5 MG immediate release tablet Take 1-2 tablets (5-10 mg total) by mouth every 4 (four) hours as needed for severe pain. 06/08/22   Regalado, Belkys A, MD  pantoprazole (PROTONIX) 40 MG tablet Take 1 tablet (40 mg total) by mouth daily. 06/08/22 07/08/22  Regalado, Belkys A, MD  polyethylene glycol (MIRALAX / GLYCOLAX) 17 g packet Take 17 g by mouth daily. 06/09/22   Regalado, Belkys A, MD  sodium chloride flush (NS) 0.9 % SOLN 5 mLs by Intracatheter route daily. 06/08/22   Regalado, Belkys A, MD  amitriptyline (ELAVIL) 25 MG tablet Take 1-2 tablets (  25-50 mg total) by mouth at bedtime as needed (headache). 03/25/20 06/18/20  Gregor Hams, MD      Allergies    Nsaids    Review of Systems   Review of Systems  Constitutional:  Positive for fever.  Gastrointestinal:  Positive for abdominal pain. Negative for vomiting.  Neurological:  Positive for weakness (right leg).    Physical Exam Updated Vital Signs BP 109/69   Pulse 96   Temp 99.8 F (37.7 C) (Oral)   Resp 19   Ht $R'5\' 6"'BW$  (1.676 m)   Wt 58.1 kg   SpO2 100%   BMI 20.66 kg/m  Physical Exam Vitals and nursing note reviewed.  Constitutional:      Appearance: She is well-developed.  HENT:     Head: Normocephalic and  atraumatic.  Cardiovascular:     Rate and Rhythm: Regular rhythm. Tachycardia present.     Heart sounds: Normal heart sounds.  Pulmonary:     Effort: Pulmonary effort is normal.     Breath sounds: Normal breath sounds.  Abdominal:     Palpations: Abdomen is soft.     Tenderness: There is abdominal tenderness (generalized soreness, but significant pain in RLQ) in the right lower quadrant and left lower quadrant.  Musculoskeletal:     Comments: Patient is able to lift up her right leg though it causes a lot of pain in her abdomen.  She is able to hold it up against gravity but has to let it down quickly due to the degree of pain.  Skin:    General: Skin is warm and dry.  Neurological:     Mental Status: She is alert.     ED Results / Procedures / Treatments   Labs (all labs ordered are listed, but only abnormal results are displayed) Labs Reviewed  COMPREHENSIVE METABOLIC PANEL - Abnormal; Notable for the following components:      Result Value   Potassium 3.4 (*)    Glucose, Bld 120 (*)    BUN <5 (*)    Albumin 3.0 (*)    AST 11 (*)    All other components within normal limits  CBC WITH DIFFERENTIAL/PLATELET - Abnormal; Notable for the following components:   WBC 13.1 (*)    Hemoglobin 11.4 (*)    HCT 34.9 (*)    RDW 15.8 (*)    Platelets 515 (*)    Neutro Abs 11.0 (*)    All other components within normal limits  APTT - Abnormal; Notable for the following components:   aPTT 37 (*)    All other components within normal limits  URINALYSIS, ROUTINE W REFLEX MICROSCOPIC - Abnormal; Notable for the following components:   APPearance HAZY (*)    Hgb urine dipstick LARGE (*)    RBC / HPF >50 (*)    Bacteria, UA FEW (*)    All other components within normal limits  I-STAT CHEM 8, ED - Abnormal; Notable for the following components:   BUN <3 (*)    Creatinine, Ser 0.40 (*)    Glucose, Bld 114 (*)    Calcium, Ion 1.14 (*)    All other components within normal limits   I-STAT BETA HCG BLOOD, ED ([facility], [facility], [facility] ONLY) - Abnormal; Notable for the following components:   I-stat hCG, quantitative 5.7 (*)    All other components within normal limits  CULTURE, BLOOD (ROUTINE X 2)  CULTURE, BLOOD (ROUTINE X 2)  URINE CULTURE  LACTIC ACID, PLASMA  PROTIME-INR  LACTIC ACID, PLASMA  HCG, SERUM, QUALITATIVE  PREGNANCY, URINE  I-STAT BETA HCG BLOOD, ED ([facility], [facility], [facility] ONLY)    EKG EKG Interpretation  Date/Time:  Monday June 11 2022 14:12:35 EDT Ventricular Rate:  93 PR Interval:  105 QRS Duration: 91 QT Interval:  349 QTC Calculation: 435 R Axis:   15 Text Interpretation: Sinus rhythm Short PR interval Nonspecific T abnormalities, diffuse leads since last tracing no significant change Confirmed by Daleen Bo [phone]) on 06/11/2022 2:19:05 PM  Radiology DG Chest Port 1 View  Result Date: 06/11/2022 CLINICAL DATA:  Possible sepsis. History of Crohn disease. Diabetes. Ex-smoker. EXAM: PORTABLE CHEST 1 VIEW COMPARISON:  05/22/2022 FINDINGS: The heart size and mediastinal contours are within normal limits. Both lungs are clear. The visualized skeletal structures are unremarkable. IMPRESSION: No active disease. Electronically Signed   By: Abigail Miyamoto M.D.   On: 06/11/2022 14:19    Procedures Procedures    Medications Ordered in ED Medications  lactated ringers bolus 1,000 mL (1,000 mLs Intravenous New Bag/Given 06/11/22 1402)  HYDROmorphone (DILAUDID) injection 1 mg (1 mg Intravenous Given 06/11/22 1356)    ED Course/ Medical Decision Making/ A&P                           Medical Decision Making Amount and/or Complexity of Data Reviewed Labs: ordered.    Details: WBC is 13.1 which is a little higher than upon discharge.  Mild hypokalemia.  Lactate is normal. Radiology: ordered and independent interpretation performed.    Details: No pneumonia or free air ECG/medicine tests: ordered.    Details: Nonspecific T waves, no acute  ischemia  Risk Prescription drug management.   Patient was given IV Dilaudid for pain.  I am concerned she is got worsening abscesses or new abscesses.  However she is otherwise stable and does not appear septic so I think she will need to get a CT and then we can decide about changing antibiotics.  Otherwise seems to be better after the IV pain medicine and is comfortable.  Was given IV fluids.  CT currently pending.  Care transferred to Dr. Melina Copa.        Final Clinical Impression(s) / ED Diagnoses Final diagnoses:  None    Rx / DC Orders ED Discharge Orders     None         Sherwood Gambler, MD 06/11/22 1643

## 2022-06-11 NOTE — ED Triage Notes (Signed)
BIBA Per EMS: Pt coming from home w/ c/o abdominal pain in LLQ abdomen.  Recent drains placed for chrons disease. Pain at incision site.  100 Temp  122/78 118HR  20RR 98% RA

## 2022-06-12 ENCOUNTER — Inpatient Hospital Stay (HOSPITAL_COMMUNITY): Payer: Medicaid Other

## 2022-06-12 DIAGNOSIS — R52 Pain, unspecified: Secondary | ICD-10-CM

## 2022-06-12 DIAGNOSIS — K56609 Unspecified intestinal obstruction, unspecified as to partial versus complete obstruction: Secondary | ICD-10-CM

## 2022-06-12 DIAGNOSIS — E119 Type 2 diabetes mellitus without complications: Secondary | ICD-10-CM | POA: Diagnosis not present

## 2022-06-12 DIAGNOSIS — M79604 Pain in right leg: Secondary | ICD-10-CM

## 2022-06-12 DIAGNOSIS — Z794 Long term (current) use of insulin: Secondary | ICD-10-CM

## 2022-06-12 DIAGNOSIS — D75839 Thrombocytosis, unspecified: Secondary | ICD-10-CM

## 2022-06-12 DIAGNOSIS — N1330 Unspecified hydronephrosis: Secondary | ICD-10-CM

## 2022-06-12 DIAGNOSIS — K50014 Crohn's disease of small intestine with abscess: Secondary | ICD-10-CM

## 2022-06-12 DIAGNOSIS — A419 Sepsis, unspecified organism: Principal | ICD-10-CM

## 2022-06-12 LAB — BASIC METABOLIC PANEL
Anion gap: 8 (ref 5–15)
BUN: <5 mg/dL — ABNORMAL LOW (ref 6–20)
CO2: 23 mmol/L (ref 22–32)
Calcium: 8.3 mg/dL — ABNORMAL LOW (ref 8.9–10.3)
Chloride: 106 mmol/L (ref 98–111)
Creatinine, Ser: 0.52 mg/dL (ref 0.44–1.00)
GFR, Estimated: >60 mL/min (ref >60)
Glucose, Bld: 97 mg/dL (ref 70–99)
Potassium: 2.9 mmol/L — ABNORMAL LOW (ref 3.5–5.1)
Sodium: 137 mmol/L (ref 135–145)

## 2022-06-12 LAB — CBC
HCT: 28.7 % — ABNORMAL LOW (ref 36.0–46.0)
Hemoglobin: 9.3 g/dL — ABNORMAL LOW (ref 12.0–15.0)
MCH: 27.1 pg (ref 26.0–34.0)
MCHC: 32.4 g/dL (ref 30.0–36.0)
MCV: 83.7 fL (ref 80.0–100.0)
Platelets: 436 10*3/uL — ABNORMAL HIGH (ref 150–400)
RBC: 3.43 MIL/uL — ABNORMAL LOW (ref 3.87–5.11)
RDW: 15.8 % — ABNORMAL HIGH (ref 11.5–15.5)
WBC: 10.9 10*3/uL — ABNORMAL HIGH (ref 4.0–10.5)
nRBC: 0 % (ref 0.0–0.2)

## 2022-06-12 LAB — HISTOPLASMA ANTIGEN, URINE: Histoplasma Antigen, urine: <0.5 (ref <0.5)

## 2022-06-12 LAB — GLUCOSE, CAPILLARY
Glucose-Capillary: 109 mg/dL — ABNORMAL HIGH (ref 70–99)
Glucose-Capillary: 107 mg/dL — ABNORMAL HIGH (ref 70–99)
Glucose-Capillary: 94 mg/dL (ref 70–99)
Glucose-Capillary: 186 mg/dL — ABNORMAL HIGH (ref 70–99)

## 2022-06-12 LAB — MAGNESIUM: Magnesium: 1.9 mg/dL (ref 1.7–2.4)

## 2022-06-12 MED ORDER — ACETAMINOPHEN 160 MG/5ML PO SOLN
500.0000 mg | Freq: Once | ORAL | Status: AC
  Administered 2022-06-12: 500 mg via ORAL
  Filled 2022-06-12: qty 20.3

## 2022-06-12 MED ORDER — METHYLPREDNISOLONE SODIUM SUCC 40 MG IJ SOLR
40.0000 mg | Freq: Two times a day (BID) | INTRAMUSCULAR | Status: DC
  Administered 2022-06-12 – 2022-06-14 (×4): 40 mg via INTRAVENOUS
  Filled 2022-06-12 (×4): qty 1

## 2022-06-12 MED ORDER — ACETAMINOPHEN 160 MG/5ML PO SOLN
500.0000 mg | Freq: Four times a day (QID) | ORAL | Status: DC | PRN
  Administered 2022-06-12 (×2): 500 mg via ORAL
  Filled 2022-06-12 (×2): qty 20.3

## 2022-06-12 MED ORDER — SODIUM CHLORIDE 0.9 % IV SOLN
5.0000 mg/kg | Freq: Once | INTRAVENOUS | Status: AC
  Administered 2022-06-12: 300 mg via INTRAVENOUS
  Filled 2022-06-12: qty 30

## 2022-06-12 MED ORDER — PIPERACILLIN-TAZOBACTAM 3.375 G IVPB
3.3750 g | INTRAVENOUS | Status: AC
  Administered 2022-06-12: 3.375 g via INTRAVENOUS
  Filled 2022-06-12: qty 50

## 2022-06-12 MED ORDER — SODIUM CHLORIDE 0.9 % IV SOLN: 5.0000 mg/kg | Freq: Once | INTRAVENOUS | Status: DC

## 2022-06-12 MED ORDER — PIPERACILLIN-TAZOBACTAM 3.375 G IVPB
3.3750 g | Freq: Three times a day (TID) | INTRAVENOUS | Status: DC
  Administered 2022-06-12 – 2022-06-16 (×13): 3.375 g via INTRAVENOUS
  Filled 2022-06-12 (×12): qty 50

## 2022-06-12 NOTE — Progress Notes (Signed)
   06/11/22 2030  Assess: MEWS Score  Temp (!) 100.9 F (38.3 C)  BP 110/66  MAP (mmHg) 79  Pulse Rate (!) 103  Resp 18  SpO2 98 %  O2 Device Room Air  Assess: MEWS Score  MEWS Temp 1  MEWS Systolic 0  MEWS Pulse 1  MEWS RR 0  MEWS LOC 0  MEWS Score 2  MEWS Score Color Yellow  Assess: if the MEWS score is Yellow or Red  Were vital signs taken at a resting state? Yes  Focused Assessment No change from prior assessment  Does the patient meet 2 or more of the SIRS criteria? Yes  Does the patient have a confirmed or suspected source of infection? Yes  Provider and Rapid Response Notified? Yes  MEWS guidelines implemented *See Row Information* Yes  Treat  MEWS Interventions Other (Comment) (tylenol given in ED)  Take Vital Signs  Increase Vital Sign Frequency  Yellow: Q 2hr X 2 then Q 4hr X 2, if remains yellow, continue Q 4hrs  Escalate  MEWS: Escalate Yellow: discuss with charge nurse/RN and consider discussing with provider and RRT  Notify: Charge Nurse/RN  Name of Charge Nurse/RN Notified Byrd Hesselbach, RN  Date Charge Nurse/RN Notified 06/11/22  Time Charge Nurse/RN Notified 2030  Notify: Provider  Provider Name/Title Dr. Cyndia Bent  Date Provider Notified 06/11/22  Time Provider Notified 2153  Method of Notification Page  Notification Reason Other (Comment) (Yellow mews)  Provider response No new orders  Date of Provider Response 06/11/22  Time of Provider Response 2200  Notify: Rapid Response  Name of Rapid Response RN Notified Erin, RN  Date Rapid Response Notified 06/12/22  Time Rapid Response Notified 0045  Document  Patient Outcome Stabilized after interventions  Assess: SIRS CRITERIA  SIRS Temperature  0  SIRS Pulse 1  SIRS Respirations  0  SIRS WBC 1  SIRS Score Sum  2

## 2022-06-12 NOTE — Progress Notes (Addendum)
Referring Physician(s): [place]  Supervising Physician: Corrie Mckusick  Patient Status:  [facility] - In-pt  Chief Complaint: Abdominal pain/abscesses   Subjective: Pt resting quietly in bed; does endorse some abd discomfort at drain sites; no N/V   Allergies: Nsaids  Medications: Prior to Admission medications   Medication Sig Start Date End Date Taking? Authorizing Provider  acetaminophen (TYLENOL) 500 MG tablet Take 1 tablet (500 mg total) by mouth every 6 (six) hours as needed. 05/22/22  Yes Nanavati, Ankit, MD  Adalimumab (HUMIRA PEN) 40 MG/0.4ML PNKT Inject 40 mg into the skin every 14 (fourteen) days. Every other Tuesday   Yes [provider]  amoxicillin-clavulanate (AUGMENTIN) 875-125 MG tablet Take 1 tablet by mouth 2 (two) times daily. 06/08/22 07/08/22 Yes Regalado, Belkys A, MD  carboxymethylcellulose (REFRESH PLUS) 0.5 % SOLN Place 1 drop into both eyes daily as needed (dry eyes).   Yes [provider]  etonogestrel (NEXPLANON) 68 MG IMPL implant 1 each by Subdermal route once.   Yes [provider]  insulin glargine (LANTUS) 100 UNIT/ML injection Inject 15 Units into the skin 2 (two) times daily.   Yes [provider]  methocarbamol (ROBAXIN-750) 750 MG tablet Take 1 tablet (750 mg total) by mouth every 8 (eight) hours as needed for up to 10 days for muscle spasms. 06/08/22 06/18/22 Yes Regalado, Belkys A, MD  pantoprazole (PROTONIX) 40 MG tablet Take 1 tablet (40 mg total) by mouth daily. 06/08/22 07/08/22 Yes Regalado, Belkys A, MD  polyethylene glycol (MIRALAX / GLYCOLAX) 17 g packet Take 17 g by mouth daily. 06/09/22  Yes Regalado, Belkys A, MD  blood glucose meter kit and supplies Dispense based on patient and insurance preference. Use up to four times daily as directed. (FOR ICD-10 E10.9, E11.9). 04/26/22   Mercy Riding, MD  insulin aspart (NOVOLOG) 100 UNIT/ML FlexPen CBG < 70: Implement Hypoglycemia Standing Orders and refer to  Hypoglycemia Standing Orders sidebar report CBG 70 - 120: 0 units CBG 121 - 150: 2 units CBG 151 - 200: 3 units CBG 201 - 250: 5 units CBG 251 - 300: 8 units CBG 301 - 350: 11 units CBG 351 - 400: 15 units CBG > 400: call MD and obtain STAT lab verification Patient not taking: Reported on 06/11/2022 06/08/22   Regalado, Jerald Kief A, MD  Insulin Pen Needle (PEN NEEDLES) 30G X 5 MM MISC 1 Pen by Does not apply route 4 (four) times daily -  before meals and at bedtime. 04/26/22   Mercy Riding, MD  oxyCODONE (OXY IR/ROXICODONE) 5 MG immediate release tablet Take 1-2 tablets (5-10 mg total) by mouth every 4 (four) hours as needed for severe pain. 06/08/22   Regalado, Belkys A, MD  sodium chloride flush (NS) 0.9 % SOLN 5 mLs by Intracatheter route daily. 06/08/22   Regalado, Belkys A, MD  amitriptyline (ELAVIL) 25 MG tablet Take 1-2 tablets (25-50 mg total) by mouth at bedtime as needed (headache). 03/25/20 06/18/20  Gregor Hams, MD     Vital Signs: BP 112/64 (BP Location: Right Arm)   Pulse 93   Temp 99.1 F (37.3 C) (Oral)   Resp 16   Ht _0  (1.676 m)   Wt 131 lb 2.8 oz (59.5 kg)   SpO2 100%   BMI 21.17 kg/m   Physical Exam awake/alert; ant/mid and rt lat abd drains intact, insertion sites ok, mildly tender, small amount of brown fluid in both JP bulbs; both drains flushed with  reflux of fluid out of insertion site of ant/superior drain  Imaging: VAS [place] LOWER EXTREMITY VENOUS (DVT)  Result Date: 06/12/2022  Lower Venous DVT Study Patient Name:  Sandra Foster  Date of Exam:   06/12/2022 Medical Rec #: [id number]        Accession #:    [id number] Date of Birth: October 11, 1991        Patient Gender: F Patient Age:   31 years Exam Location:  [facility] Procedure:      VAS [place] LOWER EXTREMITY VENOUS (DVT) Referring Phys: CHING TU --------------------------------------------------------------------------------  Indications: RLE calf pain.  Comparison Study: No previous exams Performing Technologist:  Jody Hill RVT, RDMS  Examination Guidelines: A complete evaluation includes B-mode imaging, spectral Doppler, color Doppler, and power Doppler as needed of all accessible portions of each vessel. Bilateral testing is considered an integral part of a complete examination. Limited examinations for reoccurring indications may be performed as noted. The reflux portion of the exam is performed with the patient in reverse Trendelenburg.  +---------+---------------+---------+-----------+----------+--------------+ RIGHT    CompressibilityPhasicitySpontaneityPropertiesThrombus Aging +---------+---------------+---------+-----------+----------+--------------+ CFV      Full           Yes      Yes                                 +---------+---------------+---------+-----------+----------+--------------+ SFJ      Full                                                        +---------+---------------+---------+-----------+----------+--------------+ FV Prox  Full           Yes      Yes                                 +---------+---------------+---------+-----------+----------+--------------+ FV Mid   Full           Yes      Yes                                 +---------+---------------+---------+-----------+----------+--------------+ FV DistalFull           Yes      Yes                                 +---------+---------------+---------+-----------+----------+--------------+ PFV      Full                                                        +---------+---------------+---------+-----------+----------+--------------+ POP      Full           Yes      Yes                                 +---------+---------------+---------+-----------+----------+--------------+ PTV      Full                                                        +---------+---------------+---------+-----------+----------+--------------+  PERO     Full                                                         +---------+---------------+---------+-----------+----------+--------------+   +----+---------------+---------+-----------+----------+--------------+ LEFTCompressibilityPhasicitySpontaneityPropertiesThrombus Aging +----+---------------+---------+-----------+----------+--------------+ CFV Full           Yes      Yes                                 +----+---------------+---------+-----------+----------+--------------+     Summary: RIGHT: - There is no evidence of deep vein thrombosis in the lower extremity.  - No cystic structure found in the popliteal fossa.  LEFT: - No evidence of common femoral vein obstruction.  *See table(s) above for measurements and observations. Electronically signed by Harold Barban MD on 06/12/2022 at 10:07:59 AM.    Final    CT ABDOMEN PELVIS W CONTRAST  Result Date: 06/11/2022 CLINICAL DATA:  Sepsis, history of Crohn's disease and intra-abdominal abscesses EXAM: CT ABDOMEN AND PELVIS WITH CONTRAST TECHNIQUE: Multidetector CT imaging of the abdomen and pelvis was performed using the standard protocol following bolus administration of intravenous contrast. RADIATION DOSE REDUCTION: This exam was performed according to the departmental dose-optimization program which includes automated exposure control, adjustment of the mA and/or kV according to patient size and/or use of iterative reconstruction technique. CONTRAST:  21m OMNIPAQUE IOHEXOL 350 MG/ML SOLN COMPARISON:  CT 06/01/2022 FINDINGS: Lower chest: No acute abnormality. Hepatobiliary: No focal liver abnormality is seen. No gallstones, gallbladder wall thickening, or biliary dilatation. Pancreas: Unremarkable. No pancreatic ductal dilatation or surrounding inflammatory changes. Spleen: Normal in size without focal abnormality. Adrenals/Urinary Tract: Adrenal glands are unremarkable. Significant distention of both renal collecting systems and ureters. This is increased on the right since 06/01/2022. This is likely  related to compression by inflammatory changes in the pelvis. No calculi are identified. Stomach/Bowel: Interval placement of 2 right-sided percutaneous pigtail drains into the inflammatory process in the right mid and lower abdomen. Multiple peripheral enhancing gas and fluid collections are again redemonstrated. For example the collection around the more inferior drain measuring approximally 2.5 x 3.8 x 5.0 cm (series 2/image 62 and 5/46). This appears more confluent and similar in size compared to 06/01/2022. Decompressed collection about the more superior drain Redemonstrated marked wall thickening and mucosal hyperenhancement of the terminal ileum and cecum. There is increased dilation of the terminal ileum measuring up to 3.9 cm in diameter and containing feculent material with abrupt transition at the terminal ileum. Hyperemic appendix is normal in caliber. Stomach is unremarkable. Vascular/Lymphatic: No significant vascular abnormality. No evidence of venous thrombosis. Reproductive: Uterus and bilateral adnexa are unremarkable. Other: Slightly increased small volume free fluid in the abdomen and pelvis. Right ileocolic lymphadenopathy is unchanged. Musculoskeletal: No acute or significant osseous findings. IMPRESSION: Interval placement of 2 right-sided percutaneous drains into the inflammatory process in the right lower abdomen. Overall similar extent of the inflammatory process with multiloculated inflammatory collections in the right lower quadrant. The more inferior drain is well-positioned within the dominant collection. Increased dilation of the terminal ileum containing large volume feculent material with abrupt transition point at the terminal ileum consistent with an inflammatory stricture. Similar marked inflammation of the cecum and terminal ileum. Increased right-sided hydroureteronephrosis with similar hydroureteronephrosis on  the left likely due to compression from inflammatory changes in the  pelvis. Increased small volume abdominopelvic ascites. Electronically Signed   By: Placido Sou M.D.   On: 06/11/2022 18:00   DG Chest Port 1 View  Result Date: 06/11/2022 CLINICAL DATA:  Possible sepsis. History of Crohn disease. Diabetes. Ex-smoker. EXAM: PORTABLE CHEST 1 VIEW COMPARISON:  05/22/2022 FINDINGS: The heart size and mediastinal contours are within normal limits. Both lungs are clear. The visualized skeletal structures are unremarkable. IMPRESSION: No active disease. Electronically Signed   By: Abigail Miyamoto M.D.   On: 06/11/2022 14:19    Labs:  CBC: Recent Labs    06/07/22 0819 06/08/22 0828 06/11/22 1335 06/11/22 1352 06/12/22 0646  WBC 8.3 11.0* 13.1*  --  10.9*  HGB 9.9* 9.9* 11.4* 12.2 9.3*  HCT 31.2* 31.2* 34.9* 36.0 28.7*  PLT 365 353 515*  --  436*    COAGS: Recent Labs    06/02/22 0754 06/11/22 1335  INR 1.6* 1.2  APTT 37* 37*    BMP: Recent Labs    06/07/22 0819 06/08/22 0828 06/11/22 1335 06/11/22 1352 06/12/22 0646  NA 138 137 138 137 137  K 3.9 3.5 3.4* 4.4 2.9*  CL 105 104 107 102 106  CO2 _0 --  23  GLUCOSE 88 154* 120* 114* 97  BUN <5* <5* <5* <3* <5*  CALCIUM 8.6* 8.8* 9.1  --  8.3*  CREATININE 0.64 0.64 0.49 0.40* 0.52  GFRNONAA >60 >60 >60  --  >60    LIVER FUNCTION TESTS: Recent Labs    06/01/22 1529 06/02/22 0754 06/03/22 0512 06/04/22 0031 06/05/22 0401 06/06/22 0420 06/11/22 1335  BILITOT 0.8 0.3  --  0.5  --   --  0.4  AST 8* 6*  --  9*  --   --  11*  ALT 16 10  --  11  --   --  14  ALKPHOS 62 43  --  58  --   --  59  PROT 6.6 4.4*  --  5.7*  --   --  7.1  ALBUMIN 3.1* 1.9*   < > 2.4* 2.4* 2.3* 3.0*   < > = values in this interval not displayed.    Assessment and Plan: Pt with hx Chron's disease, now with associated abd abscesses; s/p rt ant/lat drain placements 8/5; dc'd home with drains on 8/11; readmitted on 8/14 with persistent abd pain; f/u CT A/P revealed:    Interval placement of 2  right-sided percutaneous drains into the inflammatory process in the right lower abdomen.   Overall similar extent of the inflammatory process with multiloculated inflammatory collections in the right lower quadrant. The more inferior drain is well-positioned within the dominant collection.   Increased dilation of the terminal ileum containing large volume feculent material with abrupt transition point at the terminal ileum consistent with an inflammatory stricture.   Similar marked inflammation of the cecum and terminal ileum.   Increased right-sided hydroureteronephrosis with similar hydroureteronephrosis on the left likely due to compression from inflammatory changes in the pelvis.   Increased small volume abdominopelvic ascites.   Temp 99.1; WBC 10.9, hgb 9.3, creat nl; blood cx neg to date, urine cx pend; prev drain fl cx- mult org  Latest CT reviewed by Dr. Earleen Newport; no new drainable abscesses; no sig fluid remaining around ant/sup abd drain- recommend removing ; d/w CCS who concurs- drain removed in its entirety without immediate complications; gauze dressing applied to  site; continue with rt lateral abd drain (10 fr to JP) as there is fluid surrounding this drain; cont with drain irrigation (5 cc sterile saline every 8 hrs as IP); pt already has IR drain clinic f/u on 9/8; monitor labs   Electronically Signed: D. Rowe Robert, PA-C 06/12/2022, 3:50 PM   I spent a total of 15 Minutes at the the patient's bedside AND on the patient's hospital floor or unit, greater than 50% of which was counseling/coordinating care for abdominal abscess drain    Patient ID: Sandra Foster, female   DOB: April 14, 1991, 32 y.o.   MRN: [id number]

## 2022-06-12 NOTE — Assessment & Plan Note (Signed)
For the past week. Reports calf pain with weakness. Although no edema. Pt did not tolerate movement of leg due to abdominal pain.   -check stat venous doppler ultrasound

## 2022-06-12 NOTE — Assessment & Plan Note (Signed)
Presented with fever, tachycardia and leukocytosis with recent intra-abdominal abscess. - Continue IV Zosyn as above - Continue IV fluids

## 2022-06-12 NOTE — Assessment & Plan Note (Signed)
-  Recent Crohn's flare with perforated bowel s/p IR abscess drain x2 now back with inflammatory stricture at terminal ileum. No nausea or vomiting at this time.  -General surgery aware and will see tomorrow. Keep NPO past midnight.  -GI Dr.Mann has been notified by EDP

## 2022-06-12 NOTE — Progress Notes (Signed)
Subjective: Abdominal pain.  Objective: Vital signs in last 24 hours: Temp:  [98.7 F (37.1 C)-102.2 F (39 C)] 99.1 F (37.3 C) (08/15 1300) Pulse Rate:  [93-110] 93 (08/15 1300) Resp:  [12-24] 16 (08/15 1300) BP: (105-123)/(58-75) 112/64 (08/15 1300) SpO2:  [97 %-100 %] 100 % (08/15 1300) Weight:  [59.5 kg] 59.5 kg (08/14 2030) Last BM Date : 06/11/22  Intake/Output from previous day: 08/14 0701 - 08/15 0700 In: 360.3 [I.V.:310.3; IV Piggyback:50] Out: -  Intake/Output this shift: Total I/O In: 307.2 [I.V.:279.1; IV Piggyback:28.1] Out: 900 [Urine:900]  General appearance: alert and fatigued GI: tender in the lower abdomen R>L.  Lab Results: Recent Labs    06/11/22 1335 06/11/22 1352 06/12/22 0646  WBC 13.1*  --  10.9*  HGB 11.4* 12.2 9.3*  HCT 34.9* 36.0 28.7*  PLT 515*  --  436*   BMET Recent Labs    06/11/22 1335 06/11/22 1352 06/12/22 0646  NA 138 137 137  K 3.4* 4.4 2.9*  CL 107 102 106  CO2 24  --  23  GLUCOSE 120* 114* 97  BUN <5* <3* <5*  CREATININE 0.49 0.40* 0.52  CALCIUM 9.1  --  8.3*   LFT Recent Labs    06/11/22 1335  PROT 7.1  ALBUMIN 3.0*  AST 11*  ALT 14  ALKPHOS 59  BILITOT 0.4   PT/INR Recent Labs    06/11/22 1335  LABPROT 15.1  INR 1.2   Hepatitis Panel No results for input(s): "HEPBSAG", "HCVAB", "HEPAIGM", "HEPBIGM" in the last 72 hours. C-Diff No results for input(s): "CDIFFTOX" in the last 72 hours. Fecal Lactopherrin No results for input(s): "FECLLACTOFRN" in the last 72 hours.  Studies/Results: VAS [place] LOWER EXTREMITY VENOUS (DVT)  Result Date: 06/12/2022  Lower Venous DVT Study Patient Name:  GEANETTE BUONOCORE  Date of Exam:   06/12/2022 Medical Rec #: [id number]        Accession #:    [id number] Date of Birth: 31-Jul-1991        Patient Gender: F Patient Age:   31 years Exam Location:  [facility] Procedure:      VAS [place] LOWER EXTREMITY VENOUS (DVT) Referring Phys: CHING TU  --------------------------------------------------------------------------------  Indications: RLE calf pain.  Comparison Study: No previous exams Performing Technologist: Jody Hill RVT, RDMS  Examination Guidelines: A complete evaluation includes B-mode imaging, spectral Doppler, color Doppler, and power Doppler as needed of all accessible portions of each vessel. Bilateral testing is considered an integral part of a complete examination. Limited examinations for reoccurring indications may be performed as noted. The reflux portion of the exam is performed with the patient in reverse Trendelenburg.  +---------+---------------+---------+-----------+----------+--------------+ RIGHT    CompressibilityPhasicitySpontaneityPropertiesThrombus Aging +---------+---------------+---------+-----------+----------+--------------+ CFV      Full           Yes      Yes                                 +---------+---------------+---------+-----------+----------+--------------+ SFJ      Full                                                        +---------+---------------+---------+-----------+----------+--------------+ FV Prox  Full  Yes      Yes                                 +---------+---------------+---------+-----------+----------+--------------+ FV Mid   Full           Yes      Yes                                 +---------+---------------+---------+-----------+----------+--------------+ FV DistalFull           Yes      Yes                                 +---------+---------------+---------+-----------+----------+--------------+ PFV      Full                                                        +---------+---------------+---------+-----------+----------+--------------+ POP      Full           Yes      Yes                                 +---------+---------------+---------+-----------+----------+--------------+ PTV      Full                                                         +---------+---------------+---------+-----------+----------+--------------+ PERO     Full                                                        +---------+---------------+---------+-----------+----------+--------------+   +----+---------------+---------+-----------+----------+--------------+ LEFTCompressibilityPhasicitySpontaneityPropertiesThrombus Aging +----+---------------+---------+-----------+----------+--------------+ CFV Full           Yes      Yes                                 +----+---------------+---------+-----------+----------+--------------+     Summary: RIGHT: - There is no evidence of deep vein thrombosis in the lower extremity.  - No cystic structure found in the popliteal fossa.  LEFT: - No evidence of common femoral vein obstruction.  *See table(s) above for measurements and observations. Electronically signed by Coral Else MD on 06/12/2022 at 10:07:59 AM.    Final    CT ABDOMEN PELVIS W CONTRAST  Result Date: 06/11/2022 CLINICAL DATA:  Sepsis, history of Crohn's disease and intra-abdominal abscesses EXAM: CT ABDOMEN AND PELVIS WITH CONTRAST TECHNIQUE: Multidetector CT imaging of the abdomen and pelvis was performed using the standard protocol following bolus administration of intravenous contrast. RADIATION DOSE REDUCTION: This exam was performed according to the departmental dose-optimization program which includes automated exposure control, adjustment of the mA and/or kV according to patient size and/or use of iterative reconstruction technique. CONTRAST:  32mL OMNIPAQUE IOHEXOL 350 MG/ML SOLN COMPARISON:  CT 06/01/2022 FINDINGS: Lower chest: No acute abnormality. Hepatobiliary: No focal liver abnormality is seen. No gallstones, gallbladder wall thickening, or biliary dilatation. Pancreas: Unremarkable. No pancreatic ductal dilatation or surrounding inflammatory changes. Spleen: Normal in size without focal abnormality. Adrenals/Urinary  Tract: Adrenal glands are unremarkable. Significant distention of both renal collecting systems and ureters. This is increased on the right since 06/01/2022. This is likely related to compression by inflammatory changes in the pelvis. No calculi are identified. Stomach/Bowel: Interval placement of 2 right-sided percutaneous pigtail drains into the inflammatory process in the right mid and lower abdomen. Multiple peripheral enhancing gas and fluid collections are again redemonstrated. For example the collection around the more inferior drain measuring approximally 2.5 x 3.8 x 5.0 cm (series 2/image 62 and 5/46). This appears more confluent and similar in size compared to 06/01/2022. Decompressed collection about the more superior drain Redemonstrated marked wall thickening and mucosal hyperenhancement of the terminal ileum and cecum. There is increased dilation of the terminal ileum measuring up to 3.9 cm in diameter and containing feculent material with abrupt transition at the terminal ileum. Hyperemic appendix is normal in caliber. Stomach is unremarkable. Vascular/Lymphatic: No significant vascular abnormality. No evidence of venous thrombosis. Reproductive: Uterus and bilateral adnexa are unremarkable. Other: Slightly increased small volume free fluid in the abdomen and pelvis. Right ileocolic lymphadenopathy is unchanged. Musculoskeletal: No acute or significant osseous findings. IMPRESSION: Interval placement of 2 right-sided percutaneous drains into the inflammatory process in the right lower abdomen. Overall similar extent of the inflammatory process with multiloculated inflammatory collections in the right lower quadrant. The more inferior drain is well-positioned within the dominant collection. Increased dilation of the terminal ileum containing large volume feculent material with abrupt transition point at the terminal ileum consistent with an inflammatory stricture. Similar marked inflammation of the  cecum and terminal ileum. Increased right-sided hydroureteronephrosis with similar hydroureteronephrosis on the left likely due to compression from inflammatory changes in the pelvis. Increased small volume abdominopelvic ascites. Electronically Signed   By: Minerva Fester M.D.   On: 06/11/2022 18:00   DG Chest Port 1 View  Result Date: 06/11/2022 CLINICAL DATA:  Possible sepsis. History of Crohn disease. Diabetes. Ex-smoker. EXAM: PORTABLE CHEST 1 VIEW COMPARISON:  05/22/2022 FINDINGS: The heart size and mediastinal contours are within normal limits. Both lungs are clear. The visualized skeletal structures are unremarkable. IMPRESSION: No active disease. Electronically Signed   By: Jeronimo Greaves M.D.   On: 06/11/2022 14:19    Medications: Scheduled:  insulin aspart  0-9 Units Subcutaneous TID WC   methylPREDNISolone (SOLU-MEDROL) injection  40 mg Intravenous Q12H   pantoprazole  40 mg Oral Daily   Continuous:  sodium chloride 75 mL/hr at 06/11/22 2149   inFLIXimab-axxq     piperacillin-tazobactam (ZOSYN)  IV 3.375 g (06/12/22 0944)    Assessment/Plan: 1) Uncontrolled Crohn's disease. 2) Abscess. 3) Abdominal pain.   The CT scan shows that she has an inflammatory stricture.  There is increased dilation of the small bowel proximal to the TI stricture.  Her last dosing of Humira was on Friday, but it is clear that she is not responding to Humira.  She is currently on Zosyn and she continues with the drain.  One drain, the nonfunctioning drain, was removed.  For her stricturing/perforating disease she will be started on infliximab, which is a very good option.  She was warned that further immunosuppression can lead to a worsening of her condition,  which may lead to emergent surgery.  She understands and this was also discussed with her aunt.  Both want to pursue treatment with infliximab.  Plan: 1) Start infliximab. 2) Maintain Zosyn. 3) Solumedrol for now. 4) Okay with clear liquids. 5)  Pain control.  LOS: 1 day   Jazion Atteberry D 06/12/2022, 4:08 PM

## 2022-06-12 NOTE — Progress Notes (Signed)
  Progress Note   Patient: Sandra Foster UYQ:[id number] DOB: 1991-04-27 DOA: 06/11/2022     1 DOS: the patient was seen and examined on 06/12/2022        Brief hospital course: This is a no charge note.  For further details, please see H&P by Dr. Deno Etienne from earlier today.     Assessment and Plan: *Possible small bowel obstruction Crohn's disease of ileum, with abscess [facility]) Reviewed with radiology, GI.  Relatively new onset Crohn's disease, recently admitted again.  Radiology failure imaging shows no new infected fluid collections, but severe colitis.  If she has increased pain and fever. - Continue IV antibiotics - Add IV steroids - Consult general Surgery and gastroenterology, appreciate recommendations  - IR will follow for drain management    Hydroureteronephrosis Bilateral hydroureteronephrosis due to compression from inflammation. She had renal ultrasound during last admission and case was discussed with urology Dr. Annabell Howells.  He recommended repeat ultrasound in 1 month.  Recommended Lasix renogram if patient has flank pain.  Her renal function is stable.  Right leg pain [place] negative for DVT.  Likely this pain is irritation of the Psoas or irritation of the skin/groin where her perc drain goes in.   Insulin-requiring or dependent type II diabetes mellitus (HCC) - Continue SS corrections               Physical Exam: Vitals:   06/12/22 0541 06/12/22 0947 06/12/22 1300 06/12/22 1627  BP:  119/65 112/64 120/62  Pulse:  93 93 (!) 105  Resp:  18 16 16   Temp: 100.1 F (37.8 C) 98.7 F (37.1 C) 99.1 F (37.3 C) (!) 102.2 F (39 C)  TempSrc: Oral Oral Oral Oral  SpO2:  97% 100% 100%  Weight:      Height:          Family Communication: Sister at bedside    Disposition: Status is: Inpatient         Author: , MD 06/12/2022 5:00 PM  For on call review [email or web address].

## 2022-06-12 NOTE — Assessment & Plan Note (Signed)
recently hospitalized from 06/01/2022 to 06/08/2022 with sepsis secondary to Crohn's flare with perforation, intra-abdominal process.  Had cultures growing gram-positive cocci in pairs and chains.  Multiple organisms present.  She underwent CT-guided abdominal abscess drain placement x2 by IR on 06/02/2022. 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 Drains still in good position.  -Will keep on IV Zosyn with repeat blood cultures -see further recs under SBO

## 2022-06-12 NOTE — Progress Notes (Signed)
MD notified of fever.  Orders received.

## 2022-06-12 NOTE — Assessment & Plan Note (Signed)
   Managed with sensitive sliding scale 

## 2022-06-12 NOTE — Progress Notes (Signed)
RLE venous duplex has been completed.   Results can be found under chart review under CV PROC. 06/12/2022 10:04 AM Surya Folden RVT, RDMS

## 2022-06-12 NOTE — Assessment & Plan Note (Signed)
Bilateral hydroureteronephrosis due to compression from inflammation. - This has been present since recent admission although CT A/P showing worsening on the right compared to the left. -She had renal ultrasound during last admission and case was discussed with urology Dr. Annabell Howells.  He recommended repeat ultrasound in 1 month.  Recommended Lasix renogram if patient has flank pain.  If she does not have flank pain at this time.  Her renal function is stable.

## 2022-06-12 NOTE — H&P (Signed)
History and Physical    Patient: Sandra Foster ACZ:[id number] DOB: 1991/03/13 DOA: 06/11/2022 DOS: the patient was seen and examined on 06/12/2022 PCP: Jorge Ny, PA-C  Patient coming from: Home  Chief Complaint:  Chief Complaint  Patient presents with   Abdominal Pain   Post-op Problem   HPI: Sandra Foster is a 31 y.o. female with medical history significant of Crohn's disease on Humira, insulin-dependent T2DM, iron deficiency anemia who presents with worsening abdominal pain.   She was recently hospitalized from 06/01/2022 to 06/08/2022 with sepsis secondary to Crohn's flare with perforation, intra-abdominal process.  Had cultures growing gram-positive cocci in pairs and chains.  Multiple organisms present.  She underwent CT-guided abdominal abscess drain placement x2 by IR on 06/02/2022.  Infectious disease was consulted and recommended discharge on Augmentin for 4 weeks.  About 3 days ago, she began to have worsening right-sided abdominal pain.  Stools have become more loose to almost diarrhea-like.  Reports one of her 2 drains has almost stopped draining.  There was 1 day where fluid came out more dark red but now is back to brownish in color.  No nausea or vomiting.  She has also noticed right calf pain with increase weakness of her right leg which has been present even before she was discharged during last admission.  No edema.  In the ED, she was febrile up to 102.23F, tachycardic and normotensive on room air.  Worsening leukocytosis of 13, hemoglobin 11.4, platelet of 515.  CMP largely unremarkable.  CT abdomen/pelvis with contrast showed overall similar extent of abscess with well-positioned drain.  There is increased dilatation of the terminal ileum containing large volume feculent material with abrupt transition point at the terminal ileum consistent with inflammatory stricture. Increased right-sided hydroureteronephrosis with similar hydroureteronephrosis on the left due to  compression from inflammatory changes in the pelvis.  EDP discussed with general surgery Dr. Gala Lewandowsky who will see in evaluation tomorrow.  Message also sent to GI Dr. Collene Mares. She was started on IV Zosyn and hospitalist was called for admission.  Review of Systems: As mentioned in the history of present illness. All other systems reviewed and are negative. Past Medical History:  Diagnosis Date   Chlamydia    Crohn's disease ([place])    Diabetes mellitus without complication ([place])    Fetal demise    hyperglycemic hyperosmolar nonketonic syndrome   Hernia    Pregnancy induced hypertension    Pyelonephritis    Past Surgical History:  Procedure Laterality Date   BIOPSY  01/09/2022   Procedure: BIOPSY;  Surgeon: Carol Jadelin, MD;  Location: [facility];  Service: Gastroenterology;;   COLONOSCOPY WITH PROPOFOL N/A 01/09/2022   Procedure: COLONOSCOPY WITH PROPOFOL;  Surgeon: Carol Frimet, MD;  Location: [place];  Service: Gastroenterology;  Laterality: N/A;   HERNIA REPAIR     Social History:  reports that she has quit smoking. Her smoking use included cigarettes. She smoked an average of .25 packs per day. She quit smokeless tobacco use about 11 years ago. She reports that she does not drink alcohol and does not use drugs.  Allergies  Allergen Reactions   Nsaids Other (See Comments)    Crohn's disease = NO NSAIDs    Family History  Problem Relation Age of Onset   Hypertension Maternal Grandmother     Prior to Admission medications   Medication Sig Start Date End Date Taking? Authorizing Provider  acetaminophen (TYLENOL) 500 MG tablet Take 1 tablet (500 mg total) by mouth  every 6 (six) hours as needed. 05/22/22  Yes Nanavati, Ankit, MD  Adalimumab (HUMIRA PEN) 40 MG/0.4ML PNKT Inject 40 mg into the skin every 14 (fourteen) days. Every other Tuesday   Yes [provider]  amoxicillin-clavulanate (AUGMENTIN) 875-125 MG tablet Take 1 tablet by mouth 2 (two) times daily.  06/08/22 07/08/22 Yes Regalado, Belkys A, MD  carboxymethylcellulose (REFRESH PLUS) 0.5 % SOLN Place 1 drop into both eyes daily as needed (dry eyes).   Yes [provider]  etonogestrel (NEXPLANON) 68 MG IMPL implant 1 each by Subdermal route once.   Yes [provider]  insulin glargine (LANTUS) 100 UNIT/ML injection Inject 15 Units into the skin 2 (two) times daily.   Yes [provider]  methocarbamol (ROBAXIN-750) 750 MG tablet Take 1 tablet (750 mg total) by mouth every 8 (eight) hours as needed for up to 10 days for muscle spasms. 06/08/22 06/18/22 Yes Regalado, Belkys A, MD  pantoprazole (PROTONIX) 40 MG tablet Take 1 tablet (40 mg total) by mouth daily. 06/08/22 07/08/22 Yes Regalado, Belkys A, MD  polyethylene glycol (MIRALAX / GLYCOLAX) 17 g packet Take 17 g by mouth daily. 06/09/22  Yes Regalado, Belkys A, MD  blood glucose meter kit and supplies Dispense based on patient and insurance preference. Use up to four times daily as directed. (FOR ICD-10 E10.9, E11.9). 04/26/22   Mercy Riding, MD  insulin aspart (NOVOLOG) 100 UNIT/ML FlexPen CBG < 70: Implement Hypoglycemia Standing Orders and refer to Hypoglycemia Standing Orders sidebar report CBG 70 - 120: 0 units CBG 121 - 150: 2 units CBG 151 - 200: 3 units CBG 201 - 250: 5 units CBG 251 - 300: 8 units CBG 301 - 350: 11 units CBG 351 - 400: 15 units CBG > 400: call MD and obtain STAT lab verification Patient not taking: Reported on 06/11/2022 06/08/22   Regalado, Jerald Kief A, MD  Insulin Pen Needle (PEN NEEDLES) 30G X 5 MM MISC 1 Pen by Does not apply route 4 (four) times daily -  before meals and at bedtime. 04/26/22   Mercy Riding, MD  oxyCODONE (OXY IR/ROXICODONE) 5 MG immediate release tablet Take 1-2 tablets (5-10 mg total) by mouth every 4 (four) hours as needed for severe pain. 06/08/22   Regalado, Belkys A, MD  sodium chloride flush (NS) 0.9 % SOLN 5 mLs by Intracatheter route daily. 06/08/22   Regalado, Belkys A, MD   amitriptyline (ELAVIL) 25 MG tablet Take 1-2 tablets (25-50 mg total) by mouth at bedtime as needed (headache). 03/25/20 06/18/20  Gregor Hams, MD    Physical Exam: Vitals:   06/11/22 1930 06/11/22 2030 06/11/22 2150 06/12/22 0033  BP: 110/60 110/66 111/62 116/67  Pulse: (!) 110 (!) 103 99 (!) 103  Resp: _0 Temp: (!) 100.9 F (38.3 C) (!) 100.9 F (38.3 C) 99.5 F (37.5 C) 99.7 F (37.6 C)  TempSrc:  Oral Oral Oral  SpO2: 98% 98% 99% 100%  Weight:  59.5 kg    Height:  _1  (1.676 m)     Constitutional: NAD, calm, comfortable, nontoxic appearing young female lying flat in bed Eyes: lids and conjunctivae normal ENMT: Mucous membranes are moist.  Neck: normal, supple Respiratory: clear to auscultation bilaterally, no wheezing, no crackles. Normal respiratory effort. No accessory muscle use.  Cardiovascular: Regular rate and rhythm, no murmurs / rubs / gallops. No extremity edema.  Abdomen: Soft, nondistended, moderate tenderness to right quadrant.  Bowel sounds positive.  Percutaneous drain times x2 in place with minimal drainage.  Musculoskeletal: no clubbing / cyanosis. No joint deformity upper and lower extremities. No LE edema. No erythema of the right LE.  Skin: no rashes, lesions, ulcers. No induration Neurologic: CN 2-12 grossly intact.  Pt coud not lift right LE due to intolerance to lower abdominal pain. Psychiatric: Normal judgment and insight. Alert and oriented x 3. Normal mood. 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 No nausea or vomiting at this time.  -General surgery aware and will see tomorrow. Keep NPO past midnight.  -GI Dr.Mann has been notified by EDP  Crohn's disease of ileum, with abscess ([place]) recently hospitalized from 06/01/2022 to 06/08/2022 with sepsis secondary to Crohn's  flare with perforation, intra-abdominal process.  Had cultures growing gram-positive cocci in pairs and chains.  Multiple organisms present.  She underwent CT-guided abdominal abscess drain placement x2 by IR on 06/02/2022. 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 Drains still in good position.  -Will keep on IV Zosyn with repeat blood cultures -see further recs under SBO  Thrombocytosis plt 515. Likely reactive.  Hydroureteronephrosis Bilateral hydroureteronephrosis due to compression from inflammation. - This has been present since recent admission although CT A/P showing worsening on the right compared to the left. -She had renal ultrasound during last admission and case was discussed with urology Dr. Jeffie Pollock.  He recommended repeat ultrasound in 1 month.  Recommended Lasix renogram if patient has flank pain.  If she does not have flank pain at this time.  Her renal function is stable.  Right leg pain For the past week. Reports calf pain with weakness. Although no edema. Pt did not tolerate movement of leg due to abdominal pain.   -check stat venous doppler ultrasound  Sepsis (HCC) Presented with fever, tachycardia and leukocytosis with recent intra-abdominal abscess. - Continue IV Zosyn as above - Continue IV fluids  Insulin-requiring or dependent type II diabetes mellitus ([place]) Place on sensitive SSI      Advance Care Planning:   Code Status: Full Code   Consults: General surgery, GI  Family Communication: Discussed with mother at bedside  Severity of Illness: The appropriate patient status for this patient is INPATIENT. Inpatient status is judged to be reasonable and necessary in order to provide the required intensity of service to ensure the patient's safety. The patient's presenting symptoms, physical exam findings, and initial radiographic and laboratory data in the context of their chronic  comorbidities is felt to place them at high risk for further clinical deterioration. Furthermore, it is not anticipated that the patient will be medically stable for discharge from the hospital within 2 midnights of admission.   * I certify that at the point of admission it is my clinical judgment that the patient will require inpatient hospital care spanning beyond 2 midnights from the point of admission due to high intensity of service, high risk for further deterioration and high frequency of surveillance required.*  Author: Orene Desanctis, DO 06/12/2022 12:44 AM  For on call review [email or web address].

## 2022-06-12 NOTE — Assessment & Plan Note (Signed)
plt 515. Likely reactive.

## 2022-06-12 NOTE — Progress Notes (Addendum)
[facility] Progress Note     Subjective: CC:   Her cc is RLQ pain around her lower percutaneous drain. States it is so close to her hip that it causes discomfort with any movement and especially walking. She denies nausea or vomiting. She reports some flatus, though less than usual. She is having small, non-bloody stools.   Her mother is at the bedside.  Objective: Vital signs in last 24 hours: Temp:  [99.5 F (37.5 C)-102.2 F (39 C)] 100.1 F (37.8 C) (08/15 0541) Pulse Rate:  [90-110] 96 (08/15 0439) Resp:  [12-24] 18 (08/15 0439) BP: (105-125)/(58-89) 105/58 (08/15 0439) SpO2:  [98 %-100 %] 99 % (08/15 0439) Weight:  [58.1 kg-59.5 kg] 59.5 kg (08/14 2030) Last BM Date : 06/11/22  Intake/Output from previous day: 08/14 0701 - 08/15 0700 In: 360.3 [I.V.:310.3; IV Piggyback:50] Out: -  Intake/Output this shift: No intake/output data recorded.  PE: Gen:  Alert, NAD, cooperative Card:  Regular rate and rhythm, no lower extremity edema, swelling, or warmth. Pulm:  Normal effort on room air Abd: Soft, mild distention, tender around perc drains, especially lower drain, without guarding or rebound tenderness.  RUQ drain - purulent, light brown drainage  RLQ drain - scant <2cc  thin brown drainage Skin: warm and dry, no rashes  Psych: A&Ox3   Lab Results:  Recent Labs    06/11/22 1335 06/11/22 1352 06/12/22 0646  WBC 13.1*  --  10.9*  HGB 11.4* 12.2 9.3*  HCT 34.9* 36.0 28.7*  PLT 515*  --  436*   BMET Recent Labs    06/11/22 1335 06/11/22 1352 06/12/22 0646  NA 138 137 137  K 3.4* 4.4 2.9*  CL 107 102 106  CO2 24  --  23  GLUCOSE 120* 114* 97  BUN <5* <3* <5*  CREATININE 0.49 0.40* 0.52  CALCIUM 9.1  --  8.3*   PT/INR Recent Labs    06/11/22 1335  LABPROT 15.1  INR 1.2   CMP     Component Value Date/Time   NA 137 06/12/2022 0646   K 2.9 (L) 06/12/2022 0646   CL 106 06/12/2022 0646   CO2 23 06/12/2022 0646   GLUCOSE 97  06/12/2022 0646   BUN <5 (L) 06/12/2022 0646   CREATININE 0.52 06/12/2022 0646   CALCIUM 8.3 (L) 06/12/2022 0646   PROT 7.1 06/11/2022 1335   ALBUMIN 3.0 (L) 06/11/2022 1335   AST 11 (L) 06/11/2022 1335   ALT 14 06/11/2022 1335   ALKPHOS 59 06/11/2022 1335   BILITOT 0.4 06/11/2022 1335   GFRNONAA >60 06/12/2022 0646   GFRAA >60 08/28/2015 2150   Lipase     Component Value Date/Time   LIPASE 26 06/01/2022 1529       Studies/Results: CT ABDOMEN PELVIS W CONTRAST  Result Date: 06/11/2022 CLINICAL DATA:  Sepsis, history of Crohn's disease and intra-abdominal abscesses EXAM: CT ABDOMEN AND PELVIS WITH CONTRAST TECHNIQUE: Multidetector CT imaging of the abdomen and pelvis was performed using the standard protocol following bolus administration of intravenous contrast. RADIATION DOSE REDUCTION: This exam was performed according to the departmental dose-optimization program which includes automated exposure control, adjustment of the mA and/or kV according to patient size and/or use of iterative reconstruction technique. CONTRAST:  78mL OMNIPAQUE IOHEXOL 350 MG/ML SOLN COMPARISON:  CT 06/01/2022 FINDINGS: Lower chest: No acute abnormality. Hepatobiliary: No focal liver abnormality is seen. No gallstones, gallbladder wall thickening, or biliary dilatation. Pancreas: Unremarkable. No pancreatic ductal dilatation or surrounding inflammatory  changes. Spleen: Normal in size without focal abnormality. Adrenals/Urinary Tract: Adrenal glands are unremarkable. Significant distention of both renal collecting systems and ureters. This is increased on the right since 06/01/2022. This is likely related to compression by inflammatory changes in the pelvis. No calculi are identified. Stomach/Bowel: Interval placement of 2 right-sided percutaneous pigtail drains into the inflammatory process in the right mid and lower abdomen. Multiple peripheral enhancing gas and fluid collections are again redemonstrated. For  example the collection around the more inferior drain measuring approximally 2.5 x 3.8 x 5.0 cm (series 2/image 62 and 5/46). This appears more confluent and similar in size compared to 06/01/2022. Decompressed collection about the more superior drain Redemonstrated marked wall thickening and mucosal hyperenhancement of the terminal ileum and cecum. There is increased dilation of the terminal ileum measuring up to 3.9 cm in diameter and containing feculent material with abrupt transition at the terminal ileum. Hyperemic appendix is normal in caliber. Stomach is unremarkable. Vascular/Lymphatic: No significant vascular abnormality. No evidence of venous thrombosis. Reproductive: Uterus and bilateral adnexa are unremarkable. Other: Slightly increased small volume free fluid in the abdomen and pelvis. Right ileocolic lymphadenopathy is unchanged. Musculoskeletal: No acute or significant osseous findings. IMPRESSION: Interval placement of 2 right-sided percutaneous drains into the inflammatory process in the right lower abdomen. Overall similar extent of the inflammatory process with multiloculated inflammatory collections in the right lower quadrant. The more inferior drain is well-positioned within the dominant collection. Increased dilation of the terminal ileum containing large volume feculent material with abrupt transition point at the terminal ileum consistent with an inflammatory stricture. Similar marked inflammation of the cecum and terminal ileum. Increased right-sided hydroureteronephrosis with similar hydroureteronephrosis on the left likely due to compression from inflammatory changes in the pelvis. Increased small volume abdominopelvic ascites. Electronically Signed   By: Minerva Fester M.D.   On: 06/11/2022 18:00   DG Chest Port 1 View  Result Date: 06/11/2022 CLINICAL DATA:  Possible sepsis. History of Crohn disease. Diabetes. Ex-smoker. EXAM: PORTABLE CHEST 1 VIEW COMPARISON:  05/22/2022 FINDINGS:  The heart size and mediastinal contours are within normal limits. Both lungs are clear. The visualized skeletal structures are unremarkable. IMPRESSION: No active disease. Electronically Signed   By: Jeronimo Greaves M.D.   On: 06/11/2022 14:19    Anti-infectives: Anti-infectives (From admission, onward)    Start     Dose/Rate Route Frequency Ordered Stop   06/12/22 0900  piperacillin-tazobactam (ZOSYN) IVPB 3.375 g        3.375 g 12.5 mL/hr over 240 Minutes Intravenous Every 8 hours 06/12/22 0010     06/12/22 0100  piperacillin-tazobactam (ZOSYN) IVPB 3.375 g        3.375 g 12.5 mL/hr over 240 Minutes Intravenous STAT 06/12/22 0009 06/12/22 0801        Assessment/Plan  Crohn's disease with abscess - previous hospital stay 8/4-8/11, IR drains placed 8/5, Cx w/ mult organisms. She has been on Augmentin at home per ID recs (4 weeks total duration of tx) - TMAX 100.9, WBC 10.9 from 13 yesterday - clinically she is not obstructed but her CT scan does show some small bowel dilation and fecalization proximal to the TI, consistent with pSBO. May benefit from gentle laxatives. - cont both JP drains for now, timing of drain injection/removal per IR - conintue IV abx  - GI evaluation is pending, previous admission they were considering transition off of huimira to a different biologic. - no emergent general surgery needs today; SIRS is improving  with IV abx. No peritonitis on exam. Hopefully she will improve with IV abx and medical management of her crohn's disease. Surgery in the setting of active Crohn's and infection would be technically difficult with increased risk for complications like bowel injury, larger bowel resection and, given her immunosuppressive therapy and uncontrolled diabetes, she would be at increased risk for wound infection, anastomotic leak, poor wound healing, etc.   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   Hosie Spangle, PA-C [facility] Please see Amion for pager number during day hours 7:00am-4:30pm

## 2022-06-13 DIAGNOSIS — E119 Type 2 diabetes mellitus without complications: Secondary | ICD-10-CM | POA: Diagnosis not present

## 2022-06-13 DIAGNOSIS — N1330 Unspecified hydronephrosis: Secondary | ICD-10-CM | POA: Diagnosis not present

## 2022-06-13 DIAGNOSIS — E440 Moderate protein-calorie malnutrition: Secondary | ICD-10-CM

## 2022-06-13 DIAGNOSIS — D75839 Thrombocytosis, unspecified: Secondary | ICD-10-CM | POA: Diagnosis not present

## 2022-06-13 LAB — CBC
HCT: 28.2 % — ABNORMAL LOW (ref 36.0–46.0)
Hemoglobin: 9.2 g/dL — ABNORMAL LOW (ref 12.0–15.0)
MCH: 27.4 pg (ref 26.0–34.0)
MCHC: 32.6 g/dL (ref 30.0–36.0)
MCV: 83.9 fL (ref 80.0–100.0)
Platelets: 511 10*3/uL — ABNORMAL HIGH (ref 150–400)
RBC: 3.36 MIL/uL — ABNORMAL LOW (ref 3.87–5.11)
RDW: 15.6 % — ABNORMAL HIGH (ref 11.5–15.5)
WBC: 5.7 10*3/uL (ref 4.0–10.5)
nRBC: 0 % (ref 0.0–0.2)

## 2022-06-13 LAB — C DIFFICILE QUICK SCREEN W PCR REFLEX
C Diff antigen: NEGATIVE
C Diff interpretation: NOT DETECTED
C Diff toxin: NEGATIVE

## 2022-06-13 LAB — COMPREHENSIVE METABOLIC PANEL
ALT: 7 U/L (ref 0–44)
AST: 7 U/L — ABNORMAL LOW (ref 15–41)
Albumin: 2.3 g/dL — ABNORMAL LOW (ref 3.5–5.0)
Alkaline Phosphatase: 50 U/L (ref 38–126)
Anion gap: 7 (ref 5–15)
BUN: <5 mg/dL — ABNORMAL LOW (ref 6–20)
CO2: 25 mmol/L (ref 22–32)
Calcium: 8.4 mg/dL — ABNORMAL LOW (ref 8.9–10.3)
Chloride: 105 mmol/L (ref 98–111)
Creatinine, Ser: 0.58 mg/dL (ref 0.44–1.00)
GFR, Estimated: >60 mL/min (ref >60)
Glucose, Bld: 188 mg/dL — ABNORMAL HIGH (ref 70–99)
Potassium: 3.3 mmol/L — ABNORMAL LOW (ref 3.5–5.1)
Sodium: 137 mmol/L (ref 135–145)
Total Bilirubin: 0.3 mg/dL (ref 0.3–1.2)
Total Protein: 6.3 g/dL — ABNORMAL LOW (ref 6.5–8.1)

## 2022-06-13 LAB — URINE CULTURE: Culture: <10000 — AB

## 2022-06-13 LAB — GLUCOSE, CAPILLARY
Glucose-Capillary: 187 mg/dL — ABNORMAL HIGH (ref 70–99)
Glucose-Capillary: 200 mg/dL — ABNORMAL HIGH (ref 70–99)
Glucose-Capillary: 177 mg/dL — ABNORMAL HIGH (ref 70–99)
Glucose-Capillary: 172 mg/dL — ABNORMAL HIGH (ref 70–99)

## 2022-06-13 LAB — SEDIMENTATION RATE: Sed Rate: 28 mm/hr — ABNORMAL HIGH (ref 0–22)

## 2022-06-13 LAB — C-REACTIVE PROTEIN: CRP: 17.7 mg/dL — ABNORMAL HIGH (ref <1.0)

## 2022-06-13 MED ORDER — ENSURE ENLIVE PO LIQD
237.0000 mL | Freq: Three times a day (TID) | ORAL | Status: DC
  Administered 2022-06-13 – 2022-06-16 (×9): 237 mL via ORAL

## 2022-06-13 NOTE — Hospital Course (Addendum)
31 year old woman presenting with active Crohn's disease flare with terminal ileitis/inflammatory stricture with dilatation of proximal terminal ileum.  Seen by gastroenterology and general surgery.  Started on Remicade.  Overall improving, management continues as per GI and general surgery.

## 2022-06-13 NOTE — Progress Notes (Signed)
  Transition of Care (TOC) Screening Note   Patient Details  Name: Sandra Foster Date of Birth: 03-Dec-1990   Transition of Care [facility]) CM/SW Contact:    Otelia Santee, LCSW Phone Number: 06/13/2022, 11:29 AM    Transition of Care Department [facility]) has reviewed patient and no TOC needs have been identified at this time. We will continue to monitor patient advancement through interdisciplinary progression rounds. If new patient transition needs arise, please place a TOC consult.

## 2022-06-13 NOTE — Discharge Instructions (Addendum)

## 2022-06-13 NOTE — Progress Notes (Addendum)
  Progress Note   Patient: Sandra Foster KDX:[id number] DOB: 1991/01/06 DOA: 06/11/2022     2 DOS: the patient was seen and examined on 06/13/2022   Brief hospital course: 31 year old woman presenting with active Crohn's disease flare with terminal ileitis/inflammatory stricture with dilatation of proximal terminal ileum.  Seen by gastroenterology and general surgery.  Started on Remicade.  Overall improving, management continues as per GI and general surgery.  Assessment and Plan: Crohn's disease of ileum, with abscess (HCC), sepsis (fever, tachycardia, leukocytosis) --recently hospitalized from 06/01/2022 to 06/08/2022 with sepsis secondary to Crohn's flare with perforation, intra-abdominal process.  Had cultures growing gram-positive cocci in pairs and chains.  Multiple organisms present.  She underwent CT-guided abdominal abscess drain placement x2 by IR on 06/02/2022. ID was consulted and she was sent home on 4 weeks of Augmentin. Admitted with inflammatory stricture at terminal ileum. Drains still in good position, 1 removed as per interventional radiology with symptomatic improvement. --Continue Zosyn. -- Continue Remicade and steroid as per GI.  Defer management to GI and general surgery.  Thrombocytosis, reactive.  Hydroureteronephrosis --Bilateral hydroureteronephrosis due to compression from inflammation. Present since recent admission although CT A/P showing worsening on the right compared to the left. Had renal ultrasound during last admission and case was discussed with urology Dr. Annabell Howells at that time.  He recommended repeat ultrasound in 1 month.  Recommended Lasix renogram if patient has flank pain. -- Renal function stable.  Follow-up with urology as an outpatient.  Right leg pain --Ultrasound was negative for DVT.  Improved status post removal of drain.  Insulin-requiring or dependent type II diabetes mellitus (HCC) --Stable.  Continue sliding scale insulin.  Malnutrition of  moderate degree -- As per dietitian.     Subjective:  Feels better Tolerating full liquids   Physical Exam: Vitals:   06/13/22 0424 06/13/22 0812 06/13/22 1224 06/13/22 1945  BP: 108/70 107/60 (!) 114/59 106/64  Pulse: 85 70 88 81  Resp: 16 16 17 18   Temp: 98.8 F (37.1 C) 98.2 F (36.8 C) 98 F (36.7 C) 98.5 F (36.9 C)  TempSrc:  Oral Oral   SpO2: 99% 98% 100% 100%  Weight:      Height:       Physical Exam Vitals reviewed.  Constitutional:      General: She is not in acute distress.    Appearance: She is not ill-appearing or toxic-appearing.  Cardiovascular:     Rate and Rhythm: Normal rate and regular rhythm.     Heart sounds: No murmur heard.    Comments: Telemetry SR Pulmonary:     Effort: Pulmonary effort is normal. No respiratory distress.     Breath sounds: No wheezing, rhonchi or rales.  Abdominal:     Palpations: Abdomen is soft.  Neurological:     Mental Status: She is alert.  Psychiatric:        Mood and Affect: Mood normal.        Behavior: Behavior normal.     Data Reviewed:  CBG stable K+ 3.3 Hgb stable 9.2  Family Communication: aunt by facetime   Disposition: Status is: Inpatient Remains inpatient appropriate because: Crohn's flare  Planned Discharge Destination: Home    Time spent: 20 minutes  Author: , MD 06/13/2022 7:57 PM  For on call review [email or web address]/15/2022.

## 2022-06-13 NOTE — Progress Notes (Signed)
Initial Nutrition Assessment  DOCUMENTATION CODES:   Non-severe (moderate) malnutrition in context of chronic illness  INTERVENTION:  - continue Ensure Plus High Protein po BID, each supplement provides 350 kcal and 20 grams of protein.  - diet advancement as medically feasible.  - will place Carbohydrate Counting for People with Diabetes handout in AVS.  - will provide IBD/Crohn's Nutrition Therapy handout.  - placed referral for outpatient RD appointment.   NUTRITION DIAGNOSIS:   Moderate Malnutrition related to chronic illness (Crohn's disease) as evidenced by mild fat depletion, mild muscle depletion, percent weight loss  GOAL:   Patient will meet greater than or equal to 90% of their needs  MONITOR:   PO intake, Supplement acceptance, Diet advancement, Labs, Weight trends  REASON FOR ASSESSMENT:   Malnutrition Screening Tool  ASSESSMENT:   31 year-old female with medical history of hernia, Crohn's disease, iron deficiency anemia, and type 2 DM. she presented to the ED due to abdominal pain and was admitted for SBO.  Patient sitting up in bed at the time of visit with no visitors present. Diet advanced to CLD yesterday at 1610 and to FLD today at 0943. Patient has Ensure ordered TID.  She shares about her health journey since dx of Crohn's in March and more recent dx of type 2 DM. She shares about several hospitalizations recently and how this has been difficult for her and how admissions and Crohn's flares have made DM management difficult for her and her providers who are aiding in determining the most beneficial insulin regimen for her.  She has found that she tolerates seafood, string beans, and a few other foods very well. She is unable to pinpoint any foods that cause more pain/discomfort/lead to a flare more than others but does share that she is almost always in some level of discomfort. She has been using TikTok and other online resources to learn how others are  navigating life with Crohn's and this has been helpful for her.  Discussed handouts for home use and setting up an appointment with outpatient RD.  Patient shares that she had R leg weakness that required her to walk with a cane since drain was placed earlier this month. Drain was removed yesterday and she can already tell a difference in R leg strength.   She shares that she has lost a substantial amount of weight since March.  Weight on 8/14 was 131 lb and weight on 3/14 was 154 lb. This indicates 23 lb weight loss (15% body weight) in the past 5 months; significant for time frame.    Labs reviewed; CBGs: 187 and 200 mg/dl, K: 3.3 mmol/l, BUN: <5 mg/dl, Ca: 8.4 mg/dl.  Medications reviewed; sliding scale novolog, 40 mg solu-medrol BID, 40 mg oral protonix/day.     NUTRITION - FOCUSED PHYSICAL EXAM:  Flowsheet Row Most Recent Value  Orbital Region Mild depletion  Upper Arm Region Moderate depletion  Thoracic and Lumbar Region Unable to assess  Buccal Region Mild depletion  Temple Region No depletion  Clavicle Bone Region Mild depletion  Clavicle and Acromion Bone Region Moderate depletion  Scapular Bone Region Unable to assess  Dorsal Hand No depletion  Patellar Region Mild depletion  Anterior Thigh Region Mild depletion  Posterior Calf Region No depletion  Edema (RD Assessment) None  Hair Reviewed  Eyes Reviewed  Mouth Reviewed  Skin Reviewed  Nails Reviewed       Diet Order:   Diet Order  Diet full liquid Room service appropriate? Yes; Fluid consistency: Thin  Diet effective now                   EDUCATION NEEDS:   Education needs have been addressed  Skin:  Skin Assessment: Reviewed RN Assessment  Last BM:  8/16 (type 6 x1, large amount)  Height:   Ht Readings from Last 1 Encounters:  06/11/22 5\' 6"  (1.676 m)    Weight:   Wt Readings from Last 1 Encounters:  06/11/22 59.5 kg     BMI:  Body mass index is 21.17  kg/m.  Estimated Nutritional Needs:  Kcal:  1900-2100 kcal Protein:  95-105 grams Fluid:  >/= 2.2L/day     06/13/22, MS, RD, LDN, CNSC Registered Dietitian II Inpatient Clinical Nutrition RD pager # and on-call/weekend pager # available in AMION

## 2022-06-13 NOTE — Progress Notes (Addendum)
Subjective: Seems to be doing much better today.  Abdominal pain is resolved and she has had 3 loose bloody bowel movements.  She denies having nausea or vomiting. She is tolerating a full liquid diet well.  She is receiving her infusion of Remicade at the present time  Objective: Vital signs in last 24 hours: Temp:  [98.2 F (36.8 C)-102.2 F (39 C)] 98.2 F (36.8 C) (08/16 0812) Pulse Rate:  [70-105] 70 (08/16 0812) Resp:  [16-17] 16 (08/16 0812) BP: (101-123)/(50-70) 107/60 (08/16 0812) SpO2:  [94 %-100 %] 98 % (08/16 0812) Last BM Date : 06/12/22  Intake/Output from previous day: 08/15 0701 - 08/16 0700 In: 679.2 [P.O.:240; I.V.:279.1; IV Piggyback:160.1] Out: 2150 [Urine:2050; Stool:100] Intake/Output this shift: Total I/O In: 5 [Other:5] Out: 4 [Drains:4]  General appearance: alert, cooperative, and no distress Resp: clear to auscultation bilaterally Cardio: regular rate and rhythm, S1, S2 normal, no murmur, click, rub or gallop GI: soft, non-tender; bowel sounds normal; no masses,  no organomegaly  Lab Results: Recent Labs    06/11/22 1335 06/11/22 1352 06/12/22 0646 06/13/22 0753  WBC 13.1*  --  10.9* 5.7  HGB 11.4* 12.2 9.3* 9.2*  HCT 34.9* 36.0 28.7* 28.2*  PLT 515*  --  436* 511*   BMET Recent Labs    06/11/22 1335 06/11/22 1352 06/12/22 0646 06/13/22 0753  NA 138 137 137 137  K 3.4* 4.4 2.9* 3.3*  CL 107 102 106 105  CO2 24  --  23 25  GLUCOSE 120* 114* 97 188*  BUN <5* <3* <5* <5*  CREATININE 0.49 0.40* 0.52 0.58  CALCIUM 9.1  --  8.3* 8.4*   LFT Recent Labs    06/13/22 0753  PROT 6.3*  ALBUMIN 2.3*  AST 7*  ALT 7  ALKPHOS 50  BILITOT 0.3   PT/INR Recent Labs    06/11/22 1335  LABPROT 15.1  INR 1.2   Hepatitis Panel No results for input(s): "HEPBSAG", "HCVAB", "HEPAIGM", "HEPBIGM" in the last 72 hours. C-Diff Recent Labs    06/12/22 1455  CDIFFTOX NEGATIVE   No results for input(s): "CDIFFPCR" in the last 72 hours. Fecal  Lactopherrin No results for input(s): "FECLLACTOFRN" in the last 72 hours.  Studies/Results: VAS [place] LOWER EXTREMITY VENOUS (DVT)  Result Date: 06/12/2022  Lower Venous DVT Study Patient Name:  Sandra Foster  Date of Exam:   06/12/2022 Medical Rec #: [id number]        Accession #:    [id number] Date of Birth: 16-Nov-1990        Patient Gender: F Patient Age:   41 years Exam Location:  [facility] Procedure:      VAS [place] LOWER EXTREMITY VENOUS (DVT) Referring Phys: CHING TU --------------------------------------------------------------------------------  Indications: RLE calf pain.  Comparison Study: No previous exams Performing Technologist: Jody Hill RVT, RDMS  Examination Guidelines: A complete evaluation includes B-mode imaging, spectral Doppler, color Doppler, and power Doppler as needed of all accessible portions of each vessel. Bilateral testing is considered an integral part of a complete examination. Limited examinations for reoccurring indications may be performed as noted. The reflux portion of the exam is performed with the patient in reverse Trendelenburg.  +---------+---------------+---------+-----------+----------+--------------+ RIGHT    CompressibilityPhasicitySpontaneityPropertiesThrombus Aging +---------+---------------+---------+-----------+----------+--------------+ CFV      Full           Yes      Yes                                 +---------+---------------+---------+-----------+----------+--------------+  SFJ      Full                                                        +---------+---------------+---------+-----------+----------+--------------+ FV Prox  Full           Yes      Yes                                 +---------+---------------+---------+-----------+----------+--------------+ FV Mid   Full           Yes      Yes                                 +---------+---------------+---------+-----------+----------+--------------+ FV DistalFull            Yes      Yes                                 +---------+---------------+---------+-----------+----------+--------------+ PFV      Full                                                        +---------+---------------+---------+-----------+----------+--------------+ POP      Full           Yes      Yes                                 +---------+---------------+---------+-----------+----------+--------------+ PTV      Full                                                        +---------+---------------+---------+-----------+----------+--------------+ PERO     Full                                                        +---------+---------------+---------+-----------+----------+--------------+   +----+---------------+---------+-----------+----------+--------------+ LEFTCompressibilityPhasicitySpontaneityPropertiesThrombus Aging +----+---------------+---------+-----------+----------+--------------+ CFV Full           Yes      Yes                                 +----+---------------+---------+-----------+----------+--------------+     Summary: RIGHT: - There is no evidence of deep vein thrombosis in the lower extremity.  - No cystic structure found in the popliteal fossa.  LEFT: - No evidence of common femoral vein obstruction.  *See table(s) above for measurements and observations. Electronically signed by Harold Barban MD on 06/12/2022 at 10:07:59 AM.    Final    CT ABDOMEN PELVIS W CONTRAST  Result  Date: 06/11/2022 CLINICAL DATA:  Sepsis, history of Crohn's disease and intra-abdominal abscesses EXAM: CT ABDOMEN AND PELVIS WITH CONTRAST TECHNIQUE: Multidetector CT imaging of the abdomen and pelvis was performed using the standard protocol following bolus administration of intravenous contrast. RADIATION DOSE REDUCTION: This exam was performed according to the departmental dose-optimization program which includes automated exposure control, adjustment of the  mA and/or kV according to patient size and/or use of iterative reconstruction technique. CONTRAST:  53m OMNIPAQUE IOHEXOL 350 MG/ML SOLN COMPARISON:  CT 06/01/2022 FINDINGS: Lower chest: No acute abnormality. Hepatobiliary: No focal liver abnormality is seen. No gallstones, gallbladder wall thickening, or biliary dilatation. Pancreas: Unremarkable. No pancreatic ductal dilatation or surrounding inflammatory changes. Spleen: Normal in size without focal abnormality. Adrenals/Urinary Tract: Adrenal glands are unremarkable. Significant distention of both renal collecting systems and ureters. This is increased on the right since 06/01/2022. This is likely related to compression by inflammatory changes in the pelvis. No calculi are identified. Stomach/Bowel: Interval placement of 2 right-sided percutaneous pigtail drains into the inflammatory process in the right mid and lower abdomen. Multiple peripheral enhancing gas and fluid collections are again redemonstrated. For example the collection around the more inferior drain measuring approximally 2.5 x 3.8 x 5.0 cm (series 2/image 62 and 5/46). This appears more confluent and similar in size compared to 06/01/2022. Decompressed collection about the more superior drain Redemonstrated marked wall thickening and mucosal hyperenhancement of the terminal ileum and cecum. There is increased dilation of the terminal ileum measuring up to 3.9 cm in diameter and containing feculent material with abrupt transition at the terminal ileum. Hyperemic appendix is normal in caliber. Stomach is unremarkable. Vascular/Lymphatic: No significant vascular abnormality. No evidence of venous thrombosis. Reproductive: Uterus and bilateral adnexa are unremarkable. Other: Slightly increased small volume free fluid in the abdomen and pelvis. Right ileocolic lymphadenopathy is unchanged. Musculoskeletal: No acute or significant osseous findings. IMPRESSION: Interval placement of 2 right-sided  percutaneous drains into the inflammatory process in the right lower abdomen. Overall similar extent of the inflammatory process with multiloculated inflammatory collections in the right lower quadrant. The more inferior drain is well-positioned within the dominant collection. Increased dilation of the terminal ileum containing large volume feculent material with abrupt transition point at the terminal ileum consistent with an inflammatory stricture. Similar marked inflammation of the cecum and terminal ileum. Increased right-sided hydroureteronephrosis with similar hydroureteronephrosis on the left likely due to compression from inflammatory changes in the pelvis. Increased small volume abdominopelvic ascites. Electronically Signed   By: TPlacido SouM.D.   On: 06/11/2022 18:00   DG Chest Port 1 View  Result Date: 06/11/2022 CLINICAL DATA:  Possible sepsis. History of Crohn disease. Diabetes. Ex-smoker. EXAM: PORTABLE CHEST 1 VIEW COMPARISON:  05/22/2022 FINDINGS: The heart size and mediastinal contours are within normal limits. Both lungs are clear. The visualized skeletal structures are unremarkable. IMPRESSION: No active disease. Electronically Signed   By: KAbigail MiyamotoM.D.   On: 06/11/2022 14:19    Medications: I have reviewed the patient's current medications. Prior to Admission:  Medications Prior to Admission  Medication Sig Dispense Refill Last Dose   acetaminophen (TYLENOL) 500 MG tablet Take 1 tablet (500 mg total) by mouth every 6 (six) hours as needed. 30 tablet 0 06/11/2022   Adalimumab (HUMIRA PEN) 40 MG/0.4ML PNKT Inject 40 mg into the skin every 14 (fourteen) days. Every other Tuesday   Past Month   amoxicillin-clavulanate (AUGMENTIN) 875-125 MG tablet Take 1 tablet by mouth 2 (two) times daily.  60 tablet 0 06/11/2022   carboxymethylcellulose (REFRESH PLUS) 0.5 % SOLN Place 1 drop into both eyes daily as needed (dry eyes).   unknown   etonogestrel (NEXPLANON) 68 MG IMPL implant 1  each by Subdermal route once.   06/11/2022 at ongoing   insulin glargine (LANTUS) 100 UNIT/ML injection Inject 15 Units into the skin 2 (two) times daily.   06/11/2022   methocarbamol (ROBAXIN-750) 750 MG tablet Take 1 tablet (750 mg total) by mouth every 8 (eight) hours as needed for up to 10 days for muscle spasms. 30 tablet 0 06/11/2022   pantoprazole (PROTONIX) 40 MG tablet Take 1 tablet (40 mg total) by mouth daily. 30 tablet 0 06/11/2022   polyethylene glycol (MIRALAX / GLYCOLAX) 17 g packet Take 17 g by mouth daily. 14 each 0 06/11/2022   blood glucose meter kit and supplies Dispense based on patient and insurance preference. Use up to four times daily as directed. (FOR ICD-10 E10.9, E11.9). 1 each 0    insulin aspart (NOVOLOG) 100 UNIT/ML FlexPen CBG < 70: Implement Hypoglycemia Standing Orders and refer to Hypoglycemia Standing Orders sidebar report CBG 70 - 120: 0 units CBG 121 - 150: 2 units CBG 151 - 200: 3 units CBG 201 - 250: 5 units CBG 251 - 300: 8 units CBG 301 - 350: 11 units CBG 351 - 400: 15 units CBG > 400: call MD and obtain STAT lab verification (Patient not taking: Reported on 06/11/2022) 15 mL 11 Not Taking   Insulin Pen Needle (PEN NEEDLES) 30G X 5 MM MISC 1 Pen by Does not apply route 4 (four) times daily -  before meals and at bedtime. 100 each 1    oxyCODONE (OXY IR/ROXICODONE) 5 MG immediate release tablet Take 1-2 tablets (5-10 mg total) by mouth every 4 (four) hours as needed for severe pain. 30 tablet 0    sodium chloride flush (NS) 0.9 % SOLN 5 mLs by Intracatheter route daily. 300 mL 0    Scheduled:  feeding supplement  237 mL Oral TID WC   insulin aspart  0-9 Units Subcutaneous TID WC   methylPREDNISolone (SOLU-MEDROL) injection  40 mg Intravenous Q12H   pantoprazole  40 mg Oral Daily   Continuous:  piperacillin-tazobactam (ZOSYN)  IV 3.375 g (06/13/22 2725)    Assessment/Plan: 1) Crohn's disease with terminal ileitis-inflammatory  stricture with dilatation of  proximal TI on CT scan-we will monitor patient's response to Remicade.  Bilateral hydroureteronephrosis with compression from inflammatory changes in the right lower quadrant. 2) Periappendiceal abscess.  LOS: 2 days   Juanita Craver 06/13/2022, 11:47 AM

## 2022-06-13 NOTE — Progress Notes (Signed)
PHARMACY NOTE -  Zosyn  Pharmacy has been assisting with dosing of Zosyn for IAI. Dosage remains stable at 3.375 g IV q8 hr and further renal adjustments per institutional Pharmacy antibiotic protocol  Pharmacy will sign off, following peripherally for culture results or dose adjustments. Please reconsult if a change in clinical status warrants re-evaluation of dosage.  Bernadene Person, PharmD, BCPS [phone] 06/13/2022, 12:12 PM

## 2022-06-13 NOTE — Plan of Care (Signed)

## 2022-06-13 NOTE — Progress Notes (Signed)
Referring Physician(s): Sparland  Supervising Physician: Jacqulynn Cadet  Patient Status:  [name] - In-pt  Chief Complaint:  Abdominal abscess  Subjective: Pt doing fairly well today; in good spirits; states she has less abd pain and no longer has RLE pain since ant abd drain removed yesterday; no N/V   Allergies: Nsaids  Medications: Prior to Admission medications   Medication Sig Start Date End Date Taking? Authorizing Provider  acetaminophen (TYLENOL) 500 MG tablet Take 1 tablet (500 mg total) by mouth every 6 (six) hours as needed. 05/22/22  Yes Nanavati, Ankit, MD  Adalimumab (HUMIRA PEN) 40 MG/0.4ML PNKT Inject 40 mg into the skin every 14 (fourteen) days. Every other Tuesday   Yes [provider]  amoxicillin-clavulanate (AUGMENTIN) 875-125 MG tablet Take 1 tablet by mouth 2 (two) times daily. 06/08/22 07/08/22 Yes Regalado, Belkys A, MD  carboxymethylcellulose (REFRESH PLUS) 0.5 % SOLN Place 1 drop into both eyes daily as needed (dry eyes).   Yes [provider]  etonogestrel (NEXPLANON) 68 MG IMPL implant 1 each by Subdermal route once.   Yes [provider]  insulin glargine (LANTUS) 100 UNIT/ML injection Inject 15 Units into the skin 2 (two) times daily.   Yes [provider]  methocarbamol (ROBAXIN-750) 750 MG tablet Take 1 tablet (750 mg total) by mouth every 8 (eight) hours as needed for up to 10 days for muscle spasms. 06/08/22 06/18/22 Yes Regalado, Belkys A, MD  pantoprazole (PROTONIX) 40 MG tablet Take 1 tablet (40 mg total) by mouth daily. 06/08/22 07/08/22 Yes Regalado, Belkys A, MD  polyethylene glycol (MIRALAX / GLYCOLAX) 17 g packet Take 17 g by mouth daily. 06/09/22  Yes Regalado, Belkys A, MD  blood glucose meter kit and supplies Dispense based on patient and insurance preference. Use up to four times daily as directed. (FOR ICD-10 E10.9, E11.9). 04/26/22   Mercy Riding, MD  insulin aspart (NOVOLOG) 100 UNIT/ML FlexPen CBG <  70: Implement Hypoglycemia Standing Orders and refer to Hypoglycemia Standing Orders sidebar report CBG 70 - 120: 0 units CBG 121 - 150: 2 units CBG 151 - 200: 3 units CBG 201 - 250: 5 units CBG 251 - 300: 8 units CBG 301 - 350: 11 units CBG 351 - 400: 15 units CBG > 400: call MD and obtain STAT lab verification Patient not taking: Reported on 06/11/2022 06/08/22   Regalado, Jerald Kief A, MD  Insulin Pen Needle (PEN NEEDLES) 30G X 5 MM MISC 1 Pen by Does not apply route 4 (four) times daily -  before meals and at bedtime. 04/26/22   Mercy Riding, MD  oxyCODONE (OXY IR/ROXICODONE) 5 MG immediate release tablet Take 1-2 tablets (5-10 mg total) by mouth every 4 (four) hours as needed for severe pain. 06/08/22   Regalado, Belkys A, MD  sodium chloride flush (NS) 0.9 % SOLN 5 mLs by Intracatheter route daily. 06/08/22   Regalado, Belkys A, MD  amitriptyline (ELAVIL) 25 MG tablet Take 1-2 tablets (25-50 mg total) by mouth at bedtime as needed (headache). 03/25/20 06/18/20  Gregor Hams, MD     Vital Signs: BP 107/60 (BP Location: Right Arm)   Pulse 70   Temp 98.2 F (36.8 C) (Oral)   Resp 16   Ht 5' 6" (1.676 m)   Wt 131 lb 2.8 oz (59.5 kg)   SpO2 98%   BMI 21.17 kg/m   Physical Exam awake/alert; rt lat abd drain intact, insertion site ok, mildly tender, minimal output  brown fluid in JP bulb  Imaging: VAS [place] LOWER EXTREMITY VENOUS (DVT)  Result Date: 06/12/2022  Lower Venous DVT Study Patient Name:  Sandra Foster  Date of Exam:   06/12/2022 Medical Rec #: [id number]        Accession #:    [id number] Date of Birth: 1990-12-08        Patient Gender: F Patient Age:   31 years Exam Location:  [facility] Procedure:      VAS [place] LOWER EXTREMITY VENOUS (DVT) Referring Phys: CHING TU --------------------------------------------------------------------------------  Indications: RLE calf pain.  Comparison Study: No previous exams Performing Technologist: Jody Hill RVT, RDMS  Examination Guidelines: A  complete evaluation includes B-mode imaging, spectral Doppler, color Doppler, and power Doppler as needed of all accessible portions of each vessel. Bilateral testing is considered an integral part of a complete examination. Limited examinations for reoccurring indications may be performed as noted. The reflux portion of the exam is performed with the patient in reverse Trendelenburg.  +---------+---------------+---------+-----------+----------+--------------+ RIGHT    CompressibilityPhasicitySpontaneityPropertiesThrombus Aging +---------+---------------+---------+-----------+----------+--------------+ CFV      Full           Yes      Yes                                 +---------+---------------+---------+-----------+----------+--------------+ SFJ      Full                                                        +---------+---------------+---------+-----------+----------+--------------+ FV Prox  Full           Yes      Yes                                 +---------+---------------+---------+-----------+----------+--------------+ FV Mid   Full           Yes      Yes                                 +---------+---------------+---------+-----------+----------+--------------+ FV DistalFull           Yes      Yes                                 +---------+---------------+---------+-----------+----------+--------------+ PFV      Full                                                        +---------+---------------+---------+-----------+----------+--------------+ POP      Full           Yes      Yes                                 +---------+---------------+---------+-----------+----------+--------------+ PTV      Full                                                        +---------+---------------+---------+-----------+----------+--------------+  PERO     Full                                                         +---------+---------------+---------+-----------+----------+--------------+   +----+---------------+---------+-----------+----------+--------------+ LEFTCompressibilityPhasicitySpontaneityPropertiesThrombus Aging +----+---------------+---------+-----------+----------+--------------+ CFV Full           Yes      Yes                                 +----+---------------+---------+-----------+----------+--------------+     Summary: RIGHT: - There is no evidence of deep vein thrombosis in the lower extremity.  - No cystic structure found in the popliteal fossa.  LEFT: - No evidence of common femoral vein obstruction.  *See table(s) above for measurements and observations. Electronically signed by Harold Barban MD on 06/12/2022 at 10:07:59 AM.    Final    CT ABDOMEN PELVIS W CONTRAST  Result Date: 06/11/2022 CLINICAL DATA:  Sepsis, history of Crohn's disease and intra-abdominal abscesses EXAM: CT ABDOMEN AND PELVIS WITH CONTRAST TECHNIQUE: Multidetector CT imaging of the abdomen and pelvis was performed using the standard protocol following bolus administration of intravenous contrast. RADIATION DOSE REDUCTION: This exam was performed according to the departmental dose-optimization program which includes automated exposure control, adjustment of the mA and/or kV according to patient size and/or use of iterative reconstruction technique. CONTRAST:  19m OMNIPAQUE IOHEXOL 350 MG/ML SOLN COMPARISON:  CT 06/01/2022 FINDINGS: Lower chest: No acute abnormality. Hepatobiliary: No focal liver abnormality is seen. No gallstones, gallbladder wall thickening, or biliary dilatation. Pancreas: Unremarkable. No pancreatic ductal dilatation or surrounding inflammatory changes. Spleen: Normal in size without focal abnormality. Adrenals/Urinary Tract: Adrenal glands are unremarkable. Significant distention of both renal collecting systems and ureters. This is increased on the right since 06/01/2022. This is likely related  to compression by inflammatory changes in the pelvis. No calculi are identified. Stomach/Bowel: Interval placement of 2 right-sided percutaneous pigtail drains into the inflammatory process in the right mid and lower abdomen. Multiple peripheral enhancing gas and fluid collections are again redemonstrated. For example the collection around the more inferior drain measuring approximally 2.5 x 3.8 x 5.0 cm (series 2/image 62 and 5/46). This appears more confluent and similar in size compared to 06/01/2022. Decompressed collection about the more superior drain Redemonstrated marked wall thickening and mucosal hyperenhancement of the terminal ileum and cecum. There is increased dilation of the terminal ileum measuring up to 3.9 cm in diameter and containing feculent material with abrupt transition at the terminal ileum. Hyperemic appendix is normal in caliber. Stomach is unremarkable. Vascular/Lymphatic: No significant vascular abnormality. No evidence of venous thrombosis. Reproductive: Uterus and bilateral adnexa are unremarkable. Other: Slightly increased small volume free fluid in the abdomen and pelvis. Right ileocolic lymphadenopathy is unchanged. Musculoskeletal: No acute or significant osseous findings. IMPRESSION: Interval placement of 2 right-sided percutaneous drains into the inflammatory process in the right lower abdomen. Overall similar extent of the inflammatory process with multiloculated inflammatory collections in the right lower quadrant. The more inferior drain is well-positioned within the dominant collection. Increased dilation of the terminal ileum containing large volume feculent material with abrupt transition point at the terminal ileum consistent with an inflammatory stricture. Similar marked inflammation of the cecum and terminal ileum. Increased right-sided hydroureteronephrosis with similar hydroureteronephrosis on  the left likely due to compression from inflammatory changes in the pelvis.  Increased small volume abdominopelvic ascites. Electronically Signed   By: Placido Sou M.D.   On: 06/11/2022 18:00   DG Chest Port 1 View  Result Date: 06/11/2022 CLINICAL DATA:  Possible sepsis. History of Crohn disease. Diabetes. Ex-smoker. EXAM: PORTABLE CHEST 1 VIEW COMPARISON:  05/22/2022 FINDINGS: The heart size and mediastinal contours are within normal limits. Both lungs are clear. The visualized skeletal structures are unremarkable. IMPRESSION: No active disease. Electronically Signed   By: Abigail Miyamoto M.D.   On: 06/11/2022 14:19    Labs:  CBC: Recent Labs    06/08/22 0828 06/11/22 1335 06/11/22 1352 06/12/22 0646 06/13/22 0753  WBC 11.0* 13.1*  --  10.9* 5.7  HGB 9.9* 11.4* 12.2 9.3* 9.2*  HCT 31.2* 34.9* 36.0 28.7* 28.2*  PLT 353 515*  --  436* 511*    COAGS: Recent Labs    06/02/22 0754 06/11/22 1335  INR 1.6* 1.2  APTT 37* 37*    BMP: Recent Labs    06/08/22 0828 06/11/22 1335 06/11/22 1352 06/12/22 0646 06/13/22 0753  NA 137 138 137 137 137  K 3.5 3.4* 4.4 2.9* 3.3*  CL 104 107 102 106 105  CO2 26 24  --  23 25  GLUCOSE 154* 120* 114* 97 188*  BUN <5* <5* <3* <5* <5*  CALCIUM 8.8* 9.1  --  8.3* 8.4*  CREATININE 0.64 0.49 0.40* 0.52 0.58  GFRNONAA >60 >60  --  >60 >60    LIVER FUNCTION TESTS: Recent Labs    06/02/22 0754 06/03/22 0512 06/04/22 0031 06/05/22 0401 06/06/22 0420 06/11/22 1335 06/13/22 0753  BILITOT 0.3  --  0.5  --   --  0.4 0.3  AST 6*  --  9*  --   --  11* 7*  ALT 10  --  11  --   --  14 7  ALKPHOS 43  --  58  --   --  59 50  PROT 4.4*  --  5.7*  --   --  7.1 6.3*  ALBUMIN 1.9*   < > 2.4* 2.4* 2.3* 3.0* 2.3*   < > = values in this interval not displayed.    Assessment and Plan: Pt with hx Chron's disease, now with associated abd abscesses; s/p rt ant/lat drain placements 8/5 ; dc'd home with drains on 8/11; readmitted on 8/14 with persistent abd pain; ant abd drain removed yesterday; no new abscesses seen on  latest CT done 8/14; afebrile, WBC nl, hgb stable, c diff neg; blood cx neg to date; continue with rt lateral abd drain (10 fr to JP) as there is fluid surrounding this drain; cont with drain irrigation (5 cc sterile saline every 8 hrs as IP); pt already has IR drain clinic f/u on 9/8; monitor labs   Electronically Signed: D. Rowe Robert, PA-C 06/13/2022, 10:08 AM   I spent a total of 15 Minutes at the the patient's bedside AND on the patient's hospital floor or unit, greater than 50% of which was counseling/coordinating care for abdominal abscess drain     Patient ID: Sandra Foster, female   DOB: Mar 25, 1991, 31 y.o.   MRN: [id number]

## 2022-06-13 NOTE — Progress Notes (Addendum)
[facility] Progress Note     Subjective: CC:  Overall feels better. Reports flatus and liquid stools. Tolerating clears without reported increase in distention or abd pain. Abd pain and RLE pain improved s/p IR drain removal yesterday.    Objective: Vital signs in last 24 hours: Temp:  [98.2 F (36.8 C)-102.2 F (39 C)] 98.2 F (36.8 C) (08/16 0812) Pulse Rate:  [70-105] 70 (08/16 0812) Resp:  [16-17] 16 (08/16 0812) BP: (101-123)/(50-70) 107/60 (08/16 0812) SpO2:  [94 %-100 %] 98 % (08/16 0812) Last BM Date : 06/12/22  Intake/Output from previous day: 08/15 0701 - 08/16 0700 In: 679.2 [P.O.:240; I.V.:279.1; IV Piggyback:160.1] Out: 2150 [Urine:2050; Stool:100] Intake/Output this shift: Total I/O In: 5 [Other:5] Out: 4 [Drains:4]  PE: Gen:  Alert, NAD, cooperative Card:  Regular rate and rhythm, no lower extremity edema, swelling, or warmth. Pulm:  Normal effort on room air Abd: Soft, mild distention, tender around perc drains, especially lower drain, interval decrease in abd pain compared to exam yesterday, no peritonitis.   IR drain - purulent, light brown drainage Skin: warm and dry, no rashes  Psych: A&Ox3   Lab Results:  Recent Labs    06/12/22 0646 06/13/22 0753  WBC 10.9* 5.7  HGB 9.3* 9.2*  HCT 28.7* 28.2*  PLT 436* 511*   BMET Recent Labs    06/12/22 0646 06/13/22 0753  NA 137 137  K 2.9* 3.3*  CL 106 105  CO2 23 25  GLUCOSE 97 188*  BUN <5* <5*  CREATININE 0.52 0.58  CALCIUM 8.3* 8.4*   PT/INR Recent Labs    06/11/22 1335  LABPROT 15.1  INR 1.2   CMP     Component Value Date/Time   NA 137 06/13/2022 0753   K 3.3 (L) 06/13/2022 0753   CL 105 06/13/2022 0753   CO2 25 06/13/2022 0753   GLUCOSE 188 (H) 06/13/2022 0753   BUN <5 (L) 06/13/2022 0753   CREATININE 0.58 06/13/2022 0753   CALCIUM 8.4 (L) 06/13/2022 0753   PROT 6.3 (L) 06/13/2022 0753   ALBUMIN 2.3 (L) 06/13/2022 0753   AST 7 (L) 06/13/2022 0753   ALT 7  06/13/2022 0753   ALKPHOS 50 06/13/2022 0753   BILITOT 0.3 06/13/2022 0753   GFRNONAA >60 06/13/2022 0753   GFRAA >60 08/28/2015 2150   Lipase     Component Value Date/Time   LIPASE 26 06/01/2022 1529       Studies/Results: VAS [place] LOWER EXTREMITY VENOUS (DVT)  Result Date: 06/12/2022  Lower Venous DVT Study Patient Name:  ANJELICA GORNIAK  Date of Exam:   06/12/2022 Medical Rec #: [id number]        Accession #:    [id number] Date of Birth: 1990/12/31        Patient Gender: F Patient Age:   31 years Exam Location:  [facility] Procedure:      VAS [place] LOWER EXTREMITY VENOUS (DVT) Referring Phys: CHING TU --------------------------------------------------------------------------------  Indications: RLE calf pain.  Comparison Study: No previous exams Performing Technologist: Jody Hill RVT, RDMS  Examination Guidelines: A complete evaluation includes B-mode imaging, spectral Doppler, color Doppler, and power Doppler as needed of all accessible portions of each vessel. Bilateral testing is considered an integral part of a complete examination. Limited examinations for reoccurring indications may be performed as noted. The reflux portion of the exam is performed with the patient in reverse Trendelenburg.  +---------+---------------+---------+-----------+----------+--------------+ RIGHT    CompressibilityPhasicitySpontaneityPropertiesThrombus Aging +---------+---------------+---------+-----------+----------+--------------+ CFV  Full           Yes      Yes                                 +---------+---------------+---------+-----------+----------+--------------+ SFJ      Full                                                        +---------+---------------+---------+-----------+----------+--------------+ FV Prox  Full           Yes      Yes                                 +---------+---------------+---------+-----------+----------+--------------+ FV Mid   Full            Yes      Yes                                 +---------+---------------+---------+-----------+----------+--------------+ FV DistalFull           Yes      Yes                                 +---------+---------------+---------+-----------+----------+--------------+ PFV      Full                                                        +---------+---------------+---------+-----------+----------+--------------+ POP      Full           Yes      Yes                                 +---------+---------------+---------+-----------+----------+--------------+ PTV      Full                                                        +---------+---------------+---------+-----------+----------+--------------+ PERO     Full                                                        +---------+---------------+---------+-----------+----------+--------------+   +----+---------------+---------+-----------+----------+--------------+ LEFTCompressibilityPhasicitySpontaneityPropertiesThrombus Aging +----+---------------+---------+-----------+----------+--------------+ CFV Full           Yes      Yes                                 +----+---------------+---------+-----------+----------+--------------+     Summary: RIGHT: - There is no evidence of deep vein thrombosis in the lower extremity.  -  No cystic structure found in the popliteal fossa.  LEFT: - No evidence of common femoral vein obstruction.  *See table(s) above for measurements and observations. Electronically signed by Coral Else MD on 06/12/2022 at 10:07:59 AM.    Final    CT ABDOMEN PELVIS W CONTRAST  Result Date: 06/11/2022 CLINICAL DATA:  Sepsis, history of Crohn's disease and intra-abdominal abscesses EXAM: CT ABDOMEN AND PELVIS WITH CONTRAST TECHNIQUE: Multidetector CT imaging of the abdomen and pelvis was performed using the standard protocol following bolus administration of intravenous contrast. RADIATION DOSE REDUCTION:  This exam was performed according to the departmental dose-optimization program which includes automated exposure control, adjustment of the mA and/or kV according to patient size and/or use of iterative reconstruction technique. CONTRAST:  28mL OMNIPAQUE IOHEXOL 350 MG/ML SOLN COMPARISON:  CT 06/01/2022 FINDINGS: Lower chest: No acute abnormality. Hepatobiliary: No focal liver abnormality is seen. No gallstones, gallbladder wall thickening, or biliary dilatation. Pancreas: Unremarkable. No pancreatic ductal dilatation or surrounding inflammatory changes. Spleen: Normal in size without focal abnormality. Adrenals/Urinary Tract: Adrenal glands are unremarkable. Significant distention of both renal collecting systems and ureters. This is increased on the right since 06/01/2022. This is likely related to compression by inflammatory changes in the pelvis. No calculi are identified. Stomach/Bowel: Interval placement of 2 right-sided percutaneous pigtail drains into the inflammatory process in the right mid and lower abdomen. Multiple peripheral enhancing gas and fluid collections are again redemonstrated. For example the collection around the more inferior drain measuring approximally 2.5 x 3.8 x 5.0 cm (series 2/image 62 and 5/46). This appears more confluent and similar in size compared to 06/01/2022. Decompressed collection about the more superior drain Redemonstrated marked wall thickening and mucosal hyperenhancement of the terminal ileum and cecum. There is increased dilation of the terminal ileum measuring up to 3.9 cm in diameter and containing feculent material with abrupt transition at the terminal ileum. Hyperemic appendix is normal in caliber. Stomach is unremarkable. Vascular/Lymphatic: No significant vascular abnormality. No evidence of venous thrombosis. Reproductive: Uterus and bilateral adnexa are unremarkable. Other: Slightly increased small volume free fluid in the abdomen and pelvis. Right ileocolic  lymphadenopathy is unchanged. Musculoskeletal: No acute or significant osseous findings. IMPRESSION: Interval placement of 2 right-sided percutaneous drains into the inflammatory process in the right lower abdomen. Overall similar extent of the inflammatory process with multiloculated inflammatory collections in the right lower quadrant. The more inferior drain is well-positioned within the dominant collection. Increased dilation of the terminal ileum containing large volume feculent material with abrupt transition point at the terminal ileum consistent with an inflammatory stricture. Similar marked inflammation of the cecum and terminal ileum. Increased right-sided hydroureteronephrosis with similar hydroureteronephrosis on the left likely due to compression from inflammatory changes in the pelvis. Increased small volume abdominopelvic ascites. Electronically Signed   By: Minerva Fester M.D.   On: 06/11/2022 18:00   DG Chest Port 1 View  Result Date: 06/11/2022 CLINICAL DATA:  Possible sepsis. History of Crohn disease. Diabetes. Ex-smoker. EXAM: PORTABLE CHEST 1 VIEW COMPARISON:  05/22/2022 FINDINGS: The heart size and mediastinal contours are within normal limits. Both lungs are clear. The visualized skeletal structures are unremarkable. IMPRESSION: No active disease. Electronically Signed   By: Jeronimo Greaves M.D.   On: 06/11/2022 14:19    Anti-infectives: Anti-infectives (From admission, onward)    Start     Dose/Rate Route Frequency Ordered Stop   06/12/22 0900  piperacillin-tazobactam (ZOSYN) IVPB 3.375 g        3.375 g 12.5  mL/hr over 240 Minutes Intravenous Every 8 hours 06/12/22 0010     06/12/22 0100  piperacillin-tazobactam (ZOSYN) IVPB 3.375 g        3.375 g 12.5 mL/hr over 240 Minutes Intravenous STAT 06/12/22 0009 06/12/22 0801        Assessment/Plan  Crohn's disease with abscess - previous hospital stay 8/4-8/11, IR drains placed 8/5, Cx w/ mult organisms. She has been on  Augmentin at home per ID recs (4 weeks total duration of tx) -  fever and leukocytosis resolved - clinically she is not obstructed but her CT scan does show some small bowel dilation and fecalization proximal to the TI, consistent with pSBO. May benefit from gentle laxatives. - cont both JP draintiming of drain injection/removal per IR - conintue IV abx  - Seen by Dr. Elnoria Howard yesterday - transitioned to infliximab and solumedrol. Appreciate GI following. - no emergent general surgery needs today; SIRS is improving with IV abx. No peritonitis on exam. Hopefully she will improve with IV abx and medical management of her crohn's disease. Surgery in the setting of active Crohn's and infection would be technically difficult with increased risk for complications like bowel injury, larger bowel resection and, given her immunosuppressive therapy and uncontrolled diabetes, she would be at increased risk for wound infection, anastomotic leak, poor wound healing, etc.   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 At the patients request.     LOS: 2 days   I reviewed nursing notes, ED provider notes, hospitalist notes, last 24 h vitals and pain scores, last 48 h intake and output, last 24 h labs and trends, and last 24 h imaging results.   Hosie Spangle, PA-C [facility] Please see Amion for pager number during day hours 7:00am-4:30pm

## 2022-06-14 DIAGNOSIS — Z794 Long term (current) use of insulin: Secondary | ICD-10-CM | POA: Diagnosis not present

## 2022-06-14 DIAGNOSIS — D75839 Thrombocytosis, unspecified: Secondary | ICD-10-CM | POA: Diagnosis not present

## 2022-06-14 DIAGNOSIS — E119 Type 2 diabetes mellitus without complications: Secondary | ICD-10-CM | POA: Diagnosis not present

## 2022-06-14 DIAGNOSIS — K50914 Crohn's disease, unspecified, with abscess: Secondary | ICD-10-CM

## 2022-06-14 LAB — GLUCOSE, CAPILLARY
Glucose-Capillary: 224 mg/dL — ABNORMAL HIGH (ref 70–99)
Glucose-Capillary: 275 mg/dL — ABNORMAL HIGH (ref 70–99)
Glucose-Capillary: 146 mg/dL — ABNORMAL HIGH (ref 70–99)
Glucose-Capillary: 179 mg/dL — ABNORMAL HIGH (ref 70–99)

## 2022-06-14 LAB — BASIC METABOLIC PANEL
Anion gap: 8 (ref 5–15)
BUN: 7 mg/dL (ref 6–20)
CO2: 26 mmol/L (ref 22–32)
Calcium: 8.6 mg/dL — ABNORMAL LOW (ref 8.9–10.3)
Chloride: 103 mmol/L (ref 98–111)
Creatinine, Ser: 0.56 mg/dL (ref 0.44–1.00)
GFR, Estimated: >60 mL/min (ref >60)
Glucose, Bld: 217 mg/dL — ABNORMAL HIGH (ref 70–99)
Potassium: 2.9 mmol/L — ABNORMAL LOW (ref 3.5–5.1)
Sodium: 137 mmol/L (ref 135–145)

## 2022-06-14 LAB — MAGNESIUM: Magnesium: 2.2 mg/dL (ref 1.7–2.4)

## 2022-06-14 LAB — PHOSPHORUS: Phosphorus: 3.1 mg/dL (ref 2.5–4.6)

## 2022-06-14 MED ORDER — POTASSIUM CHLORIDE CRYS ER 20 MEQ PO TBCR
40.0000 meq | EXTENDED_RELEASE_TABLET | ORAL | Status: AC
  Administered 2022-06-14 (×2): 40 meq via ORAL
  Filled 2022-06-14 (×2): qty 2

## 2022-06-14 MED ORDER — POLYETHYLENE GLYCOL 3350 17 G PO PACK
17.0000 g | PACK | Freq: Every day | ORAL | Status: DC
  Administered 2022-06-14 – 2022-06-16 (×3): 17 g via ORAL
  Filled 2022-06-14 (×3): qty 1

## 2022-06-14 MED ORDER — PREDNISONE 20 MG PO TABS
40.0000 mg | ORAL_TABLET | Freq: Every day | ORAL | Status: DC
  Administered 2022-06-15 – 2022-06-16 (×2): 40 mg via ORAL
  Filled 2022-06-14 (×2): qty 2

## 2022-06-14 MED ORDER — ENOXAPARIN SODIUM 40 MG/0.4ML IJ SOSY
40.0000 mg | PREFILLED_SYRINGE | Freq: Every day | INTRAMUSCULAR | Status: DC
  Administered 2022-06-14 – 2022-06-15 (×2): 40 mg via SUBCUTANEOUS
  Filled 2022-06-14 (×2): qty 0.4

## 2022-06-14 NOTE — Progress Notes (Signed)
Referring Physician(s): Joen Laura, MD  Supervising Physician: Gilmer Mor  Patient Status:  [facility] - In-pt  Chief Complaint:  Intra-abdominal abscess   Subjective:  Pt sitting upright in bed eating lunch. She denies pain, N/V, fever, chills.   Allergies: Nsaids  Medications: Prior to Admission medications   Medication Sig Start Date End Date Taking? Authorizing Provider  acetaminophen (TYLENOL) 500 MG tablet Take 1 tablet (500 mg total) by mouth every 6 (six) hours as needed. 05/22/22  Yes Nanavati, Ankit, MD  Adalimumab (HUMIRA PEN) 40 MG/0.4ML PNKT Inject 40 mg into the skin every 14 (fourteen) days. Every other Tuesday   Yes [provider]  amoxicillin-clavulanate (AUGMENTIN) 875-125 MG tablet Take 1 tablet by mouth 2 (two) times daily. 06/08/22 07/08/22 Yes Regalado, Belkys A, MD  carboxymethylcellulose (REFRESH PLUS) 0.5 % SOLN Place 1 drop into both eyes daily as needed (dry eyes).   Yes [provider]  etonogestrel (NEXPLANON) 68 MG IMPL implant 1 each by Subdermal route once.   Yes [provider]  insulin glargine (LANTUS) 100 UNIT/ML injection Inject 15 Units into the skin 2 (two) times daily.   Yes [provider]  methocarbamol (ROBAXIN-750) 750 MG tablet Take 1 tablet (750 mg total) by mouth every 8 (eight) hours as needed for up to 10 days for muscle spasms. 06/08/22 06/18/22 Yes Regalado, Belkys A, MD  pantoprazole (PROTONIX) 40 MG tablet Take 1 tablet (40 mg total) by mouth daily. 06/08/22 07/08/22 Yes Regalado, Belkys A, MD  polyethylene glycol (MIRALAX / GLYCOLAX) 17 g packet Take 17 g by mouth daily. 06/09/22  Yes Regalado, Belkys A, MD  blood glucose meter kit and supplies Dispense based on patient and insurance preference. Use up to four times daily as directed. (FOR ICD-10 E10.9, E11.9). 04/26/22   Almon Hercules, MD  insulin aspart (NOVOLOG) 100 UNIT/ML FlexPen CBG < 70: Implement Hypoglycemia Standing Orders and refer  to Hypoglycemia Standing Orders sidebar report CBG 70 - 120: 0 units CBG 121 - 150: 2 units CBG 151 - 200: 3 units CBG 201 - 250: 5 units CBG 251 - 300: 8 units CBG 301 - 350: 11 units CBG 351 - 400: 15 units CBG > 400: call MD and obtain STAT lab verification Patient not taking: Reported on 06/11/2022 06/08/22   Regalado, Jon Billings A, MD  Insulin Pen Needle (PEN NEEDLES) 30G X 5 MM MISC 1 Pen by Does not apply route 4 (four) times daily -  before meals and at bedtime. 04/26/22   Almon Hercules, MD  oxyCODONE (OXY IR/ROXICODONE) 5 MG immediate release tablet Take 1-2 tablets (5-10 mg total) by mouth every 4 (four) hours as needed for severe pain. 06/08/22   Regalado, Belkys A, MD  sodium chloride flush (NS) 0.9 % SOLN 5 mLs by Intracatheter route daily. 06/08/22   Regalado, Belkys A, MD  amitriptyline (ELAVIL) 25 MG tablet Take 1-2 tablets (25-50 mg total) by mouth at bedtime as needed (headache). 03/25/20 06/18/20  Rodolph Bong, MD     Vital Signs: BP 103/78 (BP Location: Right Arm)   Pulse (!) 52   Temp 98.4 F (36.9 C) (Oral)   Resp 18   Ht 5\' 6"  (1.676 m)   Wt 131 lb 2.8 oz (59.5 kg)   SpO2 100%   BMI 21.17 kg/m   Physical Exam Vitals reviewed.  Constitutional:      General: She is not in acute distress.    Appearance: Normal appearance. She  is not ill-appearing.  HENT:     Head: Normocephalic and atraumatic.  Eyes:     Extraocular Movements: Extraocular movements intact.     Pupils: Pupils are equal, round, and reactive to light.  Pulmonary:     Effort: Pulmonary effort is normal. No respiratory distress.  Abdominal:     Comments: Drain flushes/aspirates easily. Site unremarkable with sutures/statlock in place. ~ 15 cc brown fluid OP in JP. Dressing C/D/I.    Skin:    General: Skin is warm and dry.  Neurological:     Mental Status: She is alert and oriented to person, place, and time.  Psychiatric:        Mood and Affect: Mood normal.        Behavior: Behavior normal.         Thought Content: Thought content normal.        Judgment: Judgment normal.     Imaging: VAS [place] LOWER EXTREMITY VENOUS (DVT)  Result Date: 06/12/2022  Lower Venous DVT Study Patient Name:  Sandra Foster  Date of Exam:   06/12/2022 Medical Rec #: [id number]        Accession #:    [id number] Date of Birth: [birth year]        Patient Gender: F Patient Age:   31 years Exam Location:  [facility] Procedure:      VAS [place] LOWER EXTREMITY VENOUS (DVT) Referring Phys: CHING TU --------------------------------------------------------------------------------  Indications: RLE calf pain.  Comparison Study: No previous exams Performing Technologist: Jody Hill RVT, RDMS  Examination Guidelines: A complete evaluation includes B-mode imaging, spectral Doppler, color Doppler, and power Doppler as needed of all accessible portions of each vessel. Bilateral testing is considered an integral part of a complete examination. Limited examinations for reoccurring indications may be performed as noted. The reflux portion of the exam is performed with the patient in reverse Trendelenburg.  +---------+---------------+---------+-----------+----------+--------------+ RIGHT    CompressibilityPhasicitySpontaneityPropertiesThrombus Aging +---------+---------------+---------+-----------+----------+--------------+ CFV      Full           Yes      Yes                                 +---------+---------------+---------+-----------+----------+--------------+ SFJ      Full                                                        +---------+---------------+---------+-----------+----------+--------------+ FV Prox  Full           Yes      Yes                                 +---------+---------------+---------+-----------+----------+--------------+ FV Mid   Full           Yes      Yes                                 +---------+---------------+---------+-----------+----------+--------------+ FV DistalFull            Yes      Yes                                 +---------+---------------+---------+-----------+----------+--------------+  PFV      Full                                                        +---------+---------------+---------+-----------+----------+--------------+ POP      Full           Yes      Yes                                 +---------+---------------+---------+-----------+----------+--------------+ PTV      Full                                                        +---------+---------------+---------+-----------+----------+--------------+ PERO     Full                                                        +---------+---------------+---------+-----------+----------+--------------+   +----+---------------+---------+-----------+----------+--------------+ LEFTCompressibilityPhasicitySpontaneityPropertiesThrombus Aging +----+---------------+---------+-----------+----------+--------------+ CFV Full           Yes      Yes                                 +----+---------------+---------+-----------+----------+--------------+     Summary: RIGHT: - There is no evidence of deep vein thrombosis in the lower extremity.  - No cystic structure found in the popliteal fossa.  LEFT: - No evidence of common femoral vein obstruction.  *See table(s) above for measurements and observations. Electronically signed by Harold Barban MD on 06/12/2022 at 10:07:59 AM.    Final    CT ABDOMEN PELVIS W CONTRAST  Result Date: 06/11/2022 CLINICAL DATA:  Sepsis, history of Crohn's disease and intra-abdominal abscesses EXAM: CT ABDOMEN AND PELVIS WITH CONTRAST TECHNIQUE: Multidetector CT imaging of the abdomen and pelvis was performed using the standard protocol following bolus administration of intravenous contrast. RADIATION DOSE REDUCTION: This exam was performed according to the departmental dose-optimization program which includes automated exposure control, adjustment of the mA  and/or kV according to patient size and/or use of iterative reconstruction technique. CONTRAST:  7m OMNIPAQUE IOHEXOL 350 MG/ML SOLN COMPARISON:  CT 06/01/2022 FINDINGS: Lower chest: No acute abnormality. Hepatobiliary: No focal liver abnormality is seen. No gallstones, gallbladder wall thickening, or biliary dilatation. Pancreas: Unremarkable. No pancreatic ductal dilatation or surrounding inflammatory changes. Spleen: Normal in size without focal abnormality. Adrenals/Urinary Tract: Adrenal glands are unremarkable. Significant distention of both renal collecting systems and ureters. This is increased on the right since 06/01/2022. This is likely related to compression by inflammatory changes in the pelvis. No calculi are identified. Stomach/Bowel: Interval placement of 2 right-sided percutaneous pigtail drains into the inflammatory process in the right mid and lower abdomen. Multiple peripheral enhancing gas and fluid collections are again redemonstrated. For example the collection around the more inferior drain measuring approximally 2.5 x 3.8 x 5.0 cm (series 2/image 62 and 5/46). This appears more confluent and similar in size  compared to 06/01/2022. Decompressed collection about the more superior drain Redemonstrated marked wall thickening and mucosal hyperenhancement of the terminal ileum and cecum. There is increased dilation of the terminal ileum measuring up to 3.9 cm in diameter and containing feculent material with abrupt transition at the terminal ileum. Hyperemic appendix is normal in caliber. Stomach is unremarkable. Vascular/Lymphatic: No significant vascular abnormality. No evidence of venous thrombosis. Reproductive: Uterus and bilateral adnexa are unremarkable. Other: Slightly increased small volume free fluid in the abdomen and pelvis. Right ileocolic lymphadenopathy is unchanged. Musculoskeletal: No acute or significant osseous findings. IMPRESSION: Interval placement of 2 right-sided  percutaneous drains into the inflammatory process in the right lower abdomen. Overall similar extent of the inflammatory process with multiloculated inflammatory collections in the right lower quadrant. The more inferior drain is well-positioned within the dominant collection. Increased dilation of the terminal ileum containing large volume feculent material with abrupt transition point at the terminal ileum consistent with an inflammatory stricture. Similar marked inflammation of the cecum and terminal ileum. Increased right-sided hydroureteronephrosis with similar hydroureteronephrosis on the left likely due to compression from inflammatory changes in the pelvis. Increased small volume abdominopelvic ascites. Electronically Signed   By: Placido Sou M.D.   On: 06/11/2022 18:00   DG Chest Port 1 View  Result Date: 06/11/2022 CLINICAL DATA:  Possible sepsis. History of Crohn disease. Diabetes. Ex-smoker. EXAM: PORTABLE CHEST 1 VIEW COMPARISON:  05/22/2022 FINDINGS: The heart size and mediastinal contours are within normal limits. Both lungs are clear. The visualized skeletal structures are unremarkable. IMPRESSION: No active disease. Electronically Signed   By: Abigail Miyamoto M.D.   On: 06/11/2022 14:19    Labs:  CBC: Recent Labs    06/08/22 0828 06/11/22 1335 06/11/22 1352 06/12/22 0646 06/13/22 0753  WBC 11.0* 13.1*  --  10.9* 5.7  HGB 9.9* 11.4* 12.2 9.3* 9.2*  HCT 31.2* 34.9* 36.0 28.7* 28.2*  PLT 353 515*  --  436* 511*    COAGS: Recent Labs    06/02/22 0754 06/11/22 1335  INR 1.6* 1.2  APTT 37* 37*    BMP: Recent Labs    06/11/22 1335 06/11/22 1352 06/12/22 0646 06/13/22 0753 06/14/22 0605  NA 138 137 137 137 137  K 3.4* 4.4 2.9* 3.3* 2.9*  CL 107 102 106 105 103  CO2 24  --  $R'23 25 26  'xt$ GLUCOSE 120* 114* 97 188* 217*  BUN <5* <3* <5* <5* 7  CALCIUM 9.1  --  8.3* 8.4* 8.6*  CREATININE 0.49 0.40* 0.52 0.58 0.56  GFRNONAA >60  --  >60 >60 >60    LIVER FUNCTION  TESTS: Recent Labs    06/02/22 0754 06/03/22 0512 06/04/22 0031 06/05/22 0401 06/06/22 0420 06/11/22 1335 06/13/22 0753  BILITOT 0.3  --  0.5  --   --  0.4 0.3  AST 6*  --  9*  --   --  11* 7*  ALT 10  --  11  --   --  14 7  ALKPHOS 43  --  58  --   --  59 50  PROT 4.4*  --  5.7*  --   --  7.1 6.3*  ALBUMIN 1.9*   < > 2.4* 2.4* 2.3* 3.0* 2.3*   < > = values in this interval not displayed.    Assessment and Plan:  History of Crohn's disease associated abdominal abscesses. Pt underwent R anterior/lateral drain placements 8/5. She was d/c home 8/11. Pt returned to ED 8/14  with abdominal pain and re-admitted.  Pt had anterior drain removed 8/15. CT 8/14 shows fluid around the right lateral drain.   Continue TID flushes with 5 cc NS. Record output Q shift. Dressing changes QD or PRN if soiled.  Call IR APP or on call IR MD if difficulty flushing or sudden change in drain output.  Repeat imaging/possible drain injection once output < 10 mL/QD (excluding flush material.)   Discharge planning: Please contact IR APP or on call IR MD prior to patient d/c to ensure appropriate follow up plans are in place. Typically patient will follow up with IR clinic 10-14 days post d/c for repeat imaging/possible drain injection. IR scheduler will contact patient with date/time of appointment. Patient will need to flush drain QD with 5 cc NS, record output QD, dressing changes every 2-3 days or earlier if soiled.    IR will continue to follow - please call with questions or concerns.   Electronically Signed: Tyson Alias, NP 06/14/2022, 1:43 PM   I spent a total of 15 Minutes at the the patient's bedside AND on the patient's hospital floor or unit, greater than 50% of which was counseling/coordinating care for intra-abdominal abscess drain.

## 2022-06-14 NOTE — Progress Notes (Signed)
Progress Note   Patient: Sandra Foster MOL:[id number] DOB: 1991-05-21 DOA: 06/11/2022     3 DOS: the patient was seen and examined on 06/14/2022   Brief hospital course: 31 year old woman presenting with active Crohn's disease flare with terminal ileitis/inflammatory stricture with dilatation of proximal terminal ileum.  Seen by gastroenterology and general surgery.  Started on Remicade.  Overall improving, management continues as per GI and general surgery.  Assessment and Plan: Crohn's disease of ileum, with abscess (HCC), sepsis (fever, tachycardia, leukocytosis) --recently hospitalized from 06/01/2022 to 06/08/2022 with sepsis secondary to Crohn's flare with perforation, intra-abdominal process.  Had cultures growing gram-positive cocci in pairs and chains.  Multiple organisms present.  She underwent CT-guided abdominal abscess drain placement x2 by IR on 06/02/2022. ID was consulted and she was sent home on 4 weeks of Augmentin. Admitted with inflammatory stricture at terminal ileum. Drains still in good position, 1 removed as per interventional radiology with symptomatic improvement. --Continue Zosyn. -- Continue Remicade and steroid as per GI.  Next Remicade in 2 weeks.  Transition to prednisone.  Defer management to GI and general surgery.  Hypokalemia --Replete.  Magnesium within normal limits.    Insulin-requiring or dependent type II diabetes mellitus (HCC) --Stable.  Continue sliding scale insulin.   Hydroureteronephrosis --Bilateral hydroureteronephrosis due to compression from inflammation. Present since recent admission although CT A/P showing worsening on the right compared to the left. Had renal ultrasound during last admission and case was discussed with urology Dr. Annabell Howells at that time.  He recommended repeat ultrasound in 1 month.  Recommended Lasix renogram if patient has flank pain. -- Renal function stable.  Follow-up with urology as an outpatient.   Right leg  pain --Ultrasound was negative for DVT.  Improved status post removal of drain.  Thrombocytosis, reactive.  Malnutrition of moderate degree -- As per dietitian.      Subjective:  Feels better Small BM Tolerating grits In better spirits last 48 hours SB per RN while sleeping  Physical Exam: Vitals:   06/13/22 1945 06/14/22 0327 06/14/22 0517 06/14/22 1250  BP: 106/64 100/63 103/68 103/78  Pulse: 81 75 (!) 52 (!) 52  Resp: 18 20  18   Temp: 98.5 F (36.9 C) 97.7 F (36.5 C)  98.4 F (36.9 C)  TempSrc:    Oral  SpO2: 100% 100% 93% 100%  Weight:      Height:       Physical Exam Vitals reviewed.  Constitutional:      General: She is not in acute distress.    Appearance: She is not ill-appearing or toxic-appearing.  Cardiovascular:     Rate and Rhythm: Normal rate and regular rhythm.     Heart sounds: No murmur heard.    Comments: Telemetry SR Pulmonary:     Effort: Pulmonary effort is normal. No respiratory distress.     Breath sounds: No wheezing, rhonchi or rales.  Abdominal:     Palpations: Abdomen is soft.  Neurological:     Mental Status: She is alert.  Psychiatric:        Mood and Affect: Mood normal.        Behavior: Behavior normal.     Data Reviewed:  K+ 2.9 Mg2+ and phos WNL  Family Communication:   Disposition: Status is: Inpatient Remains inpatient appropriate because: active Crohn's with perforation  Planned Discharge Destination: Home    Time spent: 35 minutes  Author: , MD 06/14/2022 4:24 PM  For on call review [email or web address].

## 2022-06-14 NOTE — Progress Notes (Signed)
Subjective: She feels well.  Abdominal pain markedly improved.  Objective: Vital signs in last 24 hours: Temp:  [97.7 F (36.5 C)-98.5 F (36.9 C)] 97.7 F (36.5 C) (08/17 0327) Pulse Rate:  [52-81] 52 (08/17 0517) Resp:  [18-20] 20 (08/17 0327) BP: (100-106)/(63-68) 103/68 (08/17 0517) SpO2:  [93 %-100 %] 93 % (08/17 0517) Last BM Date : 06/12/22  Intake/Output from previous day: 08/16 0701 - 08/17 0700 In: 51.6 [IV Piggyback:36.6] Out: 11 [Drains:11] Intake/Output this shift: Total I/O In: 440 [P.O.:440] Out: -   General appearance: alert and no distress GI: soft, non-tender; bowel sounds normal; no masses,  no organomegaly  Lab Results: Recent Labs    06/11/22 1335 06/11/22 1352 06/12/22 0646 06/13/22 0753  WBC 13.1*  --  10.9* 5.7  HGB 11.4* 12.2 9.3* 9.2*  HCT 34.9* 36.0 28.7* 28.2*  PLT 515*  --  436* 511*   BMET Recent Labs    06/12/22 0646 06/13/22 0753 06/14/22 0605  NA 137 137 137  K 2.9* 3.3* 2.9*  CL 106 105 103  CO2 23 25 26   GLUCOSE 97 188* 217*  BUN <5* <5* 7  CREATININE 0.52 0.58 0.56  CALCIUM 8.3* 8.4* 8.6*   LFT Recent Labs    06/13/22 0753  PROT 6.3*  ALBUMIN 2.3*  AST 7*  ALT 7  ALKPHOS 50  BILITOT 0.3   PT/INR Recent Labs    06/11/22 1335  LABPROT 15.1  INR 1.2   Hepatitis Panel No results for input(s): "HEPBSAG", "HCVAB", "HEPAIGM", "HEPBIGM" in the last 72 hours. C-Diff Recent Labs    06/12/22 1455  CDIFFTOX NEGATIVE   Fecal Lactopherrin No results for input(s): "FECLLACTOFRN" in the last 72 hours.  Studies/Results: No results found.  Medications: Scheduled:  feeding supplement  237 mL Oral TID WC   insulin aspart  0-9 Units Subcutaneous TID WC   pantoprazole  40 mg Oral Daily   polyethylene glycol  17 g Oral Daily   potassium chloride  40 mEq Oral Q4H   [START ON 06/15/2022] predniSONE  40 mg Oral Q breakfast   Continuous:  piperacillin-tazobactam (ZOSYN)  IV 3.375 g (06/14/22 0916)     Assessment/Plan: 1) Crohn's disease. 2) Abscess.   She is well and it appears that infliximab is the reason for her marked improvement.  Plan: 1) Next infusion of infliximab will be in two weeks.  This will be performed as an outpatient. 2) Okay to advance diet. 3) D/C Solumedrol and transition to prednisone. 4) Continue with antibiotics. 5) ? D/C home tomorrow.  LOS: 3 days   Sandra Foster D 06/14/2022, 12:29 PM

## 2022-06-14 NOTE — Progress Notes (Signed)
Pt noted to be SB per telemetry. Pt denies any s/s. MD notified and made aware. Pt resting comfortably with no complaints. Bed in low, locked position. Call bell within reach. Plan of care ongoing.

## 2022-06-14 NOTE — Plan of Care (Signed)

## 2022-06-14 NOTE — Progress Notes (Signed)
[facility] Progress Note     Subjective: CC:  Continues to feel better. Reports one BM yesterday that was loose and non-bloody. +flatus. Tolerating FLD. Mobilizing in her room and states she wants to try to walk in the hall today.    Objective: Vital signs in last 24 hours: Temp:  [97.7 F (36.5 C)-98.5 F (36.9 C)] 97.7 F (36.5 C) (08/17 0327) Pulse Rate:  [52-88] 52 (08/17 0517) Resp:  [17-20] 20 (08/17 0327) BP: (100-114)/(59-68) 103/68 (08/17 0517) SpO2:  [93 %-100 %] 93 % (08/17 0517) Last BM Date : 06/12/22  Intake/Output from previous day: 08/16 0701 - 08/17 0700 In: 51.6 [IV Piggyback:36.6] Out: 11 [Drains:11] Intake/Output this shift: No intake/output data recorded.  PE: Gen:  Alert, NAD, cooperative Card:  Regular rate and rhythm, no lower extremity edema Pulm:  Normal effort on room air Abd: Soft, mild distention,overall nontender, interval decrease in abd pain compared to exam yesterday, no peritonitis.   IR drain - purulent drainage, effluent in tubing looks more like bile or enteric contents   Skin: warm and dry, no rashes  Psych: A&Ox3   Lab Results:  Recent Labs    06/12/22 0646 06/13/22 0753  WBC 10.9* 5.7  HGB 9.3* 9.2*  HCT 28.7* 28.2*  PLT 436* 511*   BMET Recent Labs    06/13/22 0753 06/14/22 0605  NA 137 137  K 3.3* 2.9*  CL 105 103  CO2 25 26  GLUCOSE 188* 217*  BUN <5* 7  CREATININE 0.58 0.56  CALCIUM 8.4* 8.6*   PT/INR Recent Labs    06/11/22 1335  LABPROT 15.1  INR 1.2   CMP     Component Value Date/Time   NA 137 06/14/2022 0605   K 2.9 (L) 06/14/2022 0605   CL 103 06/14/2022 0605   CO2 26 06/14/2022 0605   GLUCOSE 217 (H) 06/14/2022 0605   BUN 7 06/14/2022 0605   CREATININE 0.56 06/14/2022 0605   CALCIUM 8.6 (L) 06/14/2022 0605   PROT 6.3 (L) 06/13/2022 0753   ALBUMIN 2.3 (L) 06/13/2022 0753   AST 7 (L) 06/13/2022 0753   ALT 7 06/13/2022 0753   ALKPHOS 50 06/13/2022 0753   BILITOT 0.3  06/13/2022 0753   GFRNONAA >60 06/14/2022 0605   GFRAA >60 08/28/2015 2150   Lipase     Component Value Date/Time   LIPASE 26 06/01/2022 1529       Studies/Results: No results found.  Anti-infectives: Anti-infectives (From admission, onward)    Start     Dose/Rate Route Frequency Ordered Stop   06/12/22 0900  piperacillin-tazobactam (ZOSYN) IVPB 3.375 g        3.375 g 12.5 mL/hr over 240 Minutes Intravenous Every 8 hours 06/12/22 0010     06/12/22 0100  piperacillin-tazobactam (ZOSYN) IVPB 3.375 g        3.375 g 12.5 mL/hr over 240 Minutes Intravenous STAT 06/12/22 0009 06/12/22 0801        Assessment/Plan  Crohn's disease with abscess - previous hospital stay 8/4-8/11, IR drains placed 8/5, Cx w/ mult organisms. She has been on Augmentin at home per ID recs (4 weeks total duration of tx) -  fever and leukocytosis resolved - clinically she is not obstructed but her CT scan does show some small bowel dilation and fecalization proximal to the TI, consistent with pSBO. May benefit from gentle laxatives. - cont both JP drain - draining abscess vs controlled fistula  - conintue IV abx  - Seen  by Dr. Elnoria Howard 8/15 - transitioned to infliximab and solumedrol. Appreciate GI following. - no emergent general surgery needs today; improving with IV abx. No peritonitis on exam. Hopefully she will improve medical management of her crohn's disease. Surgery in the setting of active Crohn's and infection would be technically difficult with increased risk for complications like bowel injury, larger bowel resection and, given her immunosuppressive therapy and uncontrolled diabetes, she would be at increased risk for wound infection, anastomotic leak, poor wound healing, etc.   FEN: FLD, ensure; consider advancing to soft diet tomorrow pending bowel function, she is tolerating FLD well but just being cautious with diet advancement given pSBO w/ perforation. ID: Zosyn 8/14 >> VTE: SCD's, ok for  chemical DVT ppx from surgical perspective Foley: none Dispo: med-surg, IV abx, GI consult, TRH service   Insulin- dependent diabetes mellitus in the setting of previous chronic steroid use - A1c 9.6 on 6/26   I called the patients aunt, Allie, and updated her on the patients current condition, medication changes, drains, diet, etc. At the patients request.     LOS: 3 days   I reviewed nursing notes, ED provider notes, hospitalist notes, last 24 h vitals and pain scores, last 48 h intake and output, last 24 h labs and trends, and last 24 h imaging results.   Hosie Spangle, PA-C [facility] Please see Amion for pager number during day hours 7:00am-4:30pm

## 2022-06-15 DIAGNOSIS — Z794 Long term (current) use of insulin: Secondary | ICD-10-CM | POA: Diagnosis not present

## 2022-06-15 DIAGNOSIS — N1330 Unspecified hydronephrosis: Secondary | ICD-10-CM | POA: Diagnosis not present

## 2022-06-15 DIAGNOSIS — K50914 Crohn's disease, unspecified, with abscess: Secondary | ICD-10-CM | POA: Diagnosis not present

## 2022-06-15 DIAGNOSIS — E119 Type 2 diabetes mellitus without complications: Secondary | ICD-10-CM | POA: Diagnosis not present

## 2022-06-15 LAB — BASIC METABOLIC PANEL
Anion gap: 4 — ABNORMAL LOW (ref 5–15)
BUN: 13 mg/dL (ref 6–20)
CO2: 27 mmol/L (ref 22–32)
Calcium: 8.3 mg/dL — ABNORMAL LOW (ref 8.9–10.3)
Chloride: 108 mmol/L (ref 98–111)
Creatinine, Ser: 0.61 mg/dL (ref 0.44–1.00)
GFR, Estimated: >60 mL/min (ref >60)
Glucose, Bld: 155 mg/dL — ABNORMAL HIGH (ref 70–99)
Potassium: 3.7 mmol/L (ref 3.5–5.1)
Sodium: 139 mmol/L (ref 135–145)

## 2022-06-15 LAB — GLUCOSE, CAPILLARY
Glucose-Capillary: 137 mg/dL — ABNORMAL HIGH (ref 70–99)
Glucose-Capillary: 242 mg/dL — ABNORMAL HIGH (ref 70–99)
Glucose-Capillary: 249 mg/dL — ABNORMAL HIGH (ref 70–99)
Glucose-Capillary: 221 mg/dL — ABNORMAL HIGH (ref 70–99)

## 2022-06-15 NOTE — Progress Notes (Signed)
Subjective: Feeling better.  She feels that her pain, albeit much better, is from her drain.  Objective: Vital signs in last 24 hours: Temp:  [98.1 F (36.7 C)-98.4 F (36.9 C)] 98.1 F (36.7 C) (08/18 0546) Pulse Rate:  [52-61] 56 (08/18 0546) Resp:  [16-18] 16 (08/18 0546) BP: (98-119)/(62-78) 98/65 (08/18 0546) SpO2:  [99 %-100 %] 99 % (08/18 0546) Last BM Date : 06/14/22  Intake/Output from previous day: 08/17 0701 - 08/18 0700 In: 1065 [P.O.:960; IV Piggyback:100] Out: 25 [Drains:25] Intake/Output this shift: No intake/output data recorded.  General appearance: alert and no distress GI: tender in the RLQ  Lab Results: Recent Labs    06/13/22 0753  WBC 5.7  HGB 9.2*  HCT 28.2*  PLT 511*   BMET Recent Labs    06/13/22 0753 06/14/22 0605 06/15/22 0529  NA 137 137 139  K 3.3* 2.9* 3.7  CL 105 103 108  CO2 25 26 27   GLUCOSE 188* 217* 155*  BUN <5* 7 13  CREATININE 0.58 0.56 0.61  CALCIUM 8.4* 8.6* 8.3*   LFT Recent Labs    06/13/22 0753  PROT 6.3*  ALBUMIN 2.3*  AST 7*  ALT 7  ALKPHOS 50  BILITOT 0.3   PT/INR No results for input(s): "LABPROT", "INR" in the last 72 hours. Hepatitis Panel No results for input(s): "HEPBSAG", "HCVAB", "HEPAIGM", "HEPBIGM" in the last 72 hours. C-Diff Recent Labs    06/12/22 1455  CDIFFTOX NEGATIVE   Fecal Lactopherrin No results for input(s): "FECLLACTOFRN" in the last 72 hours.  Studies/Results: No results found.  Medications: Scheduled:  enoxaparin (LOVENOX) injection  40 mg Subcutaneous QHS   feeding supplement  237 mL Oral TID WC   insulin aspart  0-9 Units Subcutaneous TID WC   pantoprazole  40 mg Oral Daily   polyethylene glycol  17 g Oral Daily   predniSONE  40 mg Oral Q breakfast   Continuous:  piperacillin-tazobactam (ZOSYN)  IV 3.375 g (06/15/22 0013)    Assessment/Plan: 1) Crohn's disease. 2) Inflammatory TI stricture. 3) Abscess.   Clinically she continues to improve.  She feels  well and she is tolerating PO.  Her hope is that the drain can be removed today.  Plan: 1) Continue with prednisone taper. 2) ? D/C tomorrow. 3) Follow up with Dr. 06/17/22 in 1 week upon discharge.  LOS: 4 days   Thoms Barthelemy D 06/15/2022, 8:10 AM

## 2022-06-15 NOTE — Progress Notes (Signed)
  Progress Note   Patient: Sandra Foster KYH:[id number] DOB: 1991/04/07 DOA: 06/11/2022     4 DOS: the patient was seen and examined on 06/15/2022   Brief hospital course: 31 year old woman presenting with active Crohn's disease flare with terminal ileitis/inflammatory stricture with dilatation of proximal terminal ileum.  Seen by gastroenterology and general surgery.  Started on Remicade.  Overall improving, management continues as per GI and general surgery.  Assessment and Plan: Crohn's disease of ileum, with abscess (HCC), sepsis (fever, tachycardia, leukocytosis) --recently hospitalized from 06/01/2022 to 06/08/2022 with sepsis secondary to Crohn's flare with perforation, intra-abdominal process.  Had cultures growing gram-positive cocci in pairs and chains.  Multiple organisms present.  She underwent CT-guided abdominal abscess drain placement x2 by IR on 06/02/2022. ID was consulted and she was sent home on 4 weeks of Augmentin. Admitted with inflammatory stricture at terminal ileum. Drains still in good position, 1 removed as per interventional radiology with symptomatic improvement. -- improving, continue Zosyn with transition to oral abx on discharge. -- Continue Remicade and steroid as per GI.  Next Remicade in 2 weeks.  Transition to prednisone.  Defer management to GI and general surgery.   Hypokalemia --Repleted.       Insulin-requiring or dependent type II diabetes mellitus (HCC) --Stable.  Continue sliding scale insulin.   Hydroureteronephrosis --Bilateral hydroureteronephrosis due to compression from inflammation. Present since recent admission although CT A/P showing worsening on the right compared to the left. Had renal ultrasound during last admission and case was discussed with urology Dr. Annabell Howells at that time.  He recommended repeat ultrasound in 1 month.  Recommended Lasix renogram if patient has flank pain. -- Renal function stable.  Follow-up with urology as an outpatient.    Right leg pain --Ultrasound was negative for DVT.  Improved status post removal of drain.   Thrombocytosis, reactive.   Malnutrition of moderate degree -- As per dietitian.      Subjective:  Ate an omelette  Overall better but stomach feels heavy right now  Physical Exam: Vitals:   06/14/22 1250 06/14/22 2016 06/15/22 0546 06/15/22 1401  BP: 103/78 119/62 98/65 102/66  Pulse: (!) 52 61 (!) 56 69  Resp: 18 18 16    Temp: 98.4 F (36.9 C) 98.1 F (36.7 C) 98.1 F (36.7 C) 97.8 F (36.6 C)  TempSrc: Oral  Oral Oral  SpO2: 100% 100% 99% 100%  Weight:      Height:       Physical Exam Vitals reviewed.  Constitutional:      General: She is not in acute distress.    Appearance: She is not ill-appearing or toxic-appearing.  Cardiovascular:     Rate and Rhythm: Normal rate and regular rhythm.     Heart sounds: No murmur heard. Pulmonary:     Effort: Pulmonary effort is normal. No respiratory distress.     Breath sounds: No wheezing, rhonchi or rales.  Abdominal:     Palpations: Abdomen is soft.  Neurological:     Mental Status: She is alert.  Psychiatric:        Mood and Affect: Mood normal.        Behavior: Behavior normal.      Data Reviewed:  BMP noted  Family Communication: none  Disposition: Status is: Inpatient Remains inpatient appropriate because: Crohn's disease  Planned Discharge Destination: Home    Time spent: 20 minutes  Author: , MD 06/15/2022 6:39 PM  For on call review [email or web address].

## 2022-06-15 NOTE — Progress Notes (Signed)
[facility] Progress Note     Subjective: CC:  Abd pain improved but does have pain around her drain.  +flatus and having loose, semi-solid stools   Objective: Vital signs in last 24 hours: Temp:  [98.1 F (36.7 C)-98.4 F (36.9 C)] 98.1 F (36.7 C) (08/18 0546) Pulse Rate:  [52-61] 56 (08/18 0546) Resp:  [16-18] 16 (08/18 0546) BP: (98-119)/(62-78) 98/65 (08/18 0546) SpO2:  [99 %-100 %] 99 % (08/18 0546) Last BM Date : 06/14/22  Intake/Output from previous day: 08/17 0701 - 08/18 0700 In: 1065 [P.O.:960; IV Piggyback:100] Out: 25 [Drains:25] Intake/Output this shift: Total I/O In: 32.8 [IV Piggyback:32.8] Out: -   PE: Gen:  Alert, NAD, cooperative Card:  Regular rate and rhythm, no lower extremity edema Pulm:  Normal effort on room air Abd: Soft, mild distention,overall nontender  IR drain - 25 cc documented; bilious appearing effluent  Skin: warm and dry, no rashes  Psych: A&Ox3   Lab Results:  Recent Labs    06/13/22 0753  WBC 5.7  HGB 9.2*  HCT 28.2*  PLT 511*   BMET Recent Labs    06/14/22 0605 06/15/22 0529  NA 137 139  K 2.9* 3.7  CL 103 108  CO2 26 27  GLUCOSE 217* 155*  BUN 7 13  CREATININE 0.56 0.61  CALCIUM 8.6* 8.3*   PT/INR No results for input(s): "LABPROT", "INR" in the last 72 hours.  CMP     Component Value Date/Time   NA 139 06/15/2022 0529   K 3.7 06/15/2022 0529   CL 108 06/15/2022 0529   CO2 27 06/15/2022 0529   GLUCOSE 155 (H) 06/15/2022 0529   BUN 13 06/15/2022 0529   CREATININE 0.61 06/15/2022 0529   CALCIUM 8.3 (L) 06/15/2022 0529   PROT 6.3 (L) 06/13/2022 0753   ALBUMIN 2.3 (L) 06/13/2022 0753   AST 7 (L) 06/13/2022 0753   ALT 7 06/13/2022 0753   ALKPHOS 50 06/13/2022 0753   BILITOT 0.3 06/13/2022 0753   GFRNONAA >60 06/15/2022 0529   GFRAA >60 08/28/2015 2150   Lipase     Component Value Date/Time   LIPASE 26 06/01/2022 1529       Studies/Results: No results  found.  Anti-infectives: Anti-infectives (From admission, onward)    Start     Dose/Rate Route Frequency Ordered Stop   06/12/22 0900  piperacillin-tazobactam (ZOSYN) IVPB 3.375 g        3.375 g 12.5 mL/hr over 240 Minutes Intravenous Every 8 hours 06/12/22 0010     06/12/22 0100  piperacillin-tazobactam (ZOSYN) IVPB 3.375 g        3.375 g 12.5 mL/hr over 240 Minutes Intravenous STAT 06/12/22 0009 06/12/22 0801        Assessment/Plan  Crohn's disease with abscess - previous hospital stay 8/4-8/11, IR drains placed 8/5, Cx w/ mult organisms. She has been on Augmentin at home per ID recs (4 weeks total duration of tx) -  fever and leukocytosis resolved - clinically she is not obstructed but her CT scan does show some small bowel dilation and fecalization proximal to the TI, consistent with pSBO. May benefit from gentle laxatives. - cont both JP drain - draining abscess vs controlled fistula  - conintue IV abx  - Seen by Dr. Elnoria Howard - transitioned to infliximab and solumedrol. Appreciate GI following. - no emergent general surgery needs today; improving with IV abx. No peritonitis on exam. Hopefully she will improve medical management of her crohn's disease. I think  she could likely go home tomorrow with the drain in place and outpatient follow up with GI as well as in our office.   FEN: SOFT, ensure ID: Zosyn 8/14 >> VTE: SCD's Foley: none Dispo: med-surg, IV abx, GI consult, TRH service   Insulin- dependent diabetes mellitus in the setting of previous chronic steroid use - A1c 9.6 on 6/26   I called the patients aunt, Gerarda Gunther, and updated her on the patients current condition, medication changes, drains, diet, etc. At the patients request.     LOS: 4 days   I reviewed nursing notes, ED provider notes, hospitalist notes, last 24 h vitals and pain scores, last 48 h intake and output, last 24 h labs and trends, and last 24 h imaging results.   Hosie Spangle, PA-C [facility] Please see Amion for pager number during day hours 7:00am-4:30pm

## 2022-06-16 DIAGNOSIS — N1330 Unspecified hydronephrosis: Secondary | ICD-10-CM | POA: Diagnosis not present

## 2022-06-16 DIAGNOSIS — Z794 Long term (current) use of insulin: Secondary | ICD-10-CM | POA: Diagnosis not present

## 2022-06-16 DIAGNOSIS — K50014 Crohn's disease of small intestine with abscess: Secondary | ICD-10-CM | POA: Diagnosis not present

## 2022-06-16 DIAGNOSIS — E119 Type 2 diabetes mellitus without complications: Secondary | ICD-10-CM | POA: Diagnosis not present

## 2022-06-16 DIAGNOSIS — K56609 Unspecified intestinal obstruction, unspecified as to partial versus complete obstruction: Secondary | ICD-10-CM | POA: Diagnosis not present

## 2022-06-16 LAB — GLUCOSE, CAPILLARY
Glucose-Capillary: 139 mg/dL — ABNORMAL HIGH (ref 70–99)
Glucose-Capillary: 243 mg/dL — ABNORMAL HIGH (ref 70–99)

## 2022-06-16 LAB — CULTURE, BLOOD (ROUTINE X 2)
Culture: NO GROWTH
Culture: NO GROWTH
Special Requests: ADEQUATE

## 2022-06-16 MED ORDER — OXYCODONE HCL 5 MG PO TABS: 5.0000 mg | ORAL_TABLET | Freq: Four times a day (QID) | ORAL | 0 refills | Status: DC | PRN

## 2022-06-16 MED ORDER — PREDNISONE 20 MG PO TABS: 40.0000 mg | ORAL_TABLET | Freq: Every day | ORAL | 0 refills | Status: DC

## 2022-06-16 MED ORDER — INSULIN GLARGINE 100 UNIT/ML ~~LOC~~ SOLN: 9.0000 [IU] | Freq: Every day | SUBCUTANEOUS | Status: DC

## 2022-06-16 NOTE — Discharge Summary (Signed)
Physician Discharge Summary   Patient: Sandra Foster MRN: [id number] DOB: April 02, 1991  Admit date:     06/11/2022  Discharge date: 06/16/22  Discharge Physician: Murray Hodgkins   PCP: Jorge Ny, PA-C   Recommendations at discharge:   Crohn's disease of ileum, with abscess [facility]), sepsis (fever, tachycardia, leukocytosis) -- Continue Remicade and steroid as per GI.  Next Remicade in 2 weeks.  Continue prednisone on discharge.  Taper as per GI in the outpatient setting.    Hydroureteronephrosis --Bilateral hydroureteronephrosis due to compression from inflammation. Present since recent admission although CT A/P showing worsening on the right compared to the left. Had renal ultrasound during last admission and case was discussed with urology Dr. Jeffie Pollock at that time.  He recommended repeat ultrasound in 1 month.  Recommended Lasix renogram if patient has flank pain. -- Renal function stable.  Follow-up with urology as an outpatient suggested   Discharge Diagnoses: Principal Problem:   SBO (small bowel obstruction) ([place]) Active Problems:   Crohn's disease of ileum, with abscess ([place])   Insulin-requiring or dependent type II diabetes mellitus ([place])   Sepsis ([place])   Right leg pain   Hydroureteronephrosis   Thrombocytosis   Malnutrition of moderate degree  Resolved Problems:   * No resolved hospital problems. *  Hospital Course: 31 year old woman presenting with active Crohn's disease flare with terminal ileitis/inflammatory stricture with dilatation of proximal terminal ileum.  Seen by gastroenterology and general surgery.  Started on Remicade.  Overall slowly improved.  Plan discharge home on oral antibiotics, continue drain care.  Follow-up as an outpatient with general surgery and GI.  Crohn's disease of ileum, with abscess ([place]), sepsis (fever, tachycardia, leukocytosis) --recently hospitalized from 06/01/2022 to 06/08/2022 with sepsis secondary to Crohn's flare with perforation,  intra-abdominal process.  Had cultures growing gram-positive cocci in pairs and chains.  Multiple organisms present.  She underwent CT-guided abdominal abscess drain placement x2 by IR on 06/02/2022. ID was consulted and she was sent home on 4 weeks of Augmentin (through 9/11). Admitted with inflammatory stricture at terminal ileum. Drains still in good position, 1 removed as per interventional radiology with symptomatic improvement. -- Improved slowly, discharged home today on Augmentin previously prescribed.  . -- Continue Remicade and steroid as per GI.  Next Remicade in 2 weeks.  Continue prednisone on discharge.  Taper as per GI in the outpatient setting.   Hypokalemia --Repleted.       Insulin-requiring or dependent type II diabetes mellitus ([place]) --Stable.  Continue sliding scale insulin.  Resume long-acting insulin on discharge.   Hydroureteronephrosis --Bilateral hydroureteronephrosis due to compression from inflammation. Present since recent admission although CT A/P showing worsening on the right compared to the left. Had renal ultrasound during last admission and case was discussed with urology Dr. Jeffie Pollock at that time.  He recommended repeat ultrasound in 1 month.  Recommended Lasix renogram if patient has flank pain. -- Renal function stable.  Follow-up with urology as an outpatient.   Right leg pain --Ultrasound was negative for DVT.  Improved status post removal of drain.   Thrombocytosis, reactive.   Malnutrition of moderate degree -- As per dietitian.     Pain control - [employer] Controlled Substance Reporting System database was reviewed. Consultants:  General surgery GI Procedures performed:  Removal of percutaneous drain  Disposition: Home Diet recommendation:  Diet Orders (From admission, onward)     Start     Ordered   06/14/22 1141  DIET SOFT Room service appropriate?  Yes; Fluid consistency: Thin  Diet effective now       Question Answer Comment  Room  service appropriate? Yes   Fluid consistency: Thin      06/14/22 1140            DISCHARGE MEDICATION: Allergies as of 06/16/2022       Reactions   Nsaids Other (See Comments)   Crohn's disease = NO NSAIDs        Medication List     STOP taking these medications    Humira Pen 40 MG/0.4ML Pnkt Generic drug: Adalimumab   insulin aspart 100 UNIT/ML FlexPen Commonly known as: NOVOLOG       TAKE these medications    acetaminophen 500 MG tablet Commonly known as: TYLENOL Take 1 tablet (500 mg total) by mouth every 6 (six) hours as needed.   amoxicillin-clavulanate 875-125 MG tablet Commonly known as: AUGMENTIN Take 1 tablet by mouth 2 (two) times daily.   blood glucose meter kit and supplies Dispense based on patient and insurance preference. Use up to four times daily as directed. (FOR ICD-10 E10.9, E11.9).   carboxymethylcellulose 0.5 % Soln Commonly known as: REFRESH PLUS Place 1 drop into both eyes daily as needed (dry eyes).   insulin glargine 100 UNIT/ML injection Commonly known as: LANTUS Inject 0.09 mLs (9 Units total) into the skin daily. What changed:  how much to take when to take this   methocarbamol 750 MG tablet Commonly known as: Robaxin-750 Take 1 tablet (750 mg total) by mouth every 8 (eight) hours as needed for up to 10 days for muscle spasms.   Nexplanon 68 MG Impl implant Generic drug: etonogestrel 1 each by Subdermal route once.   oxyCODONE 5 MG immediate release tablet Commonly known as: Oxy IR/ROXICODONE Take 1-2 tablets (5-10 mg total) by mouth every 6 (six) hours as needed for severe pain or moderate pain. What changed:  when to take this reasons to take this   pantoprazole 40 MG tablet Commonly known as: Protonix Take 1 tablet (40 mg total) by mouth daily.   Pen Needles 30G X 5 MM Misc 1 Pen by Does not apply route 4 (four) times daily -  before meals and at bedtime.   polyethylene glycol 17 g packet Commonly known  as: MIRALAX / GLYCOLAX Take 17 g by mouth daily.   predniSONE 20 MG tablet Commonly known as: DELTASONE Take 2 tablets (40 mg total) by mouth daily with breakfast. Start taking on: June 17, 2022   sodium chloride flush 0.9 % Soln Commonly known as: NS 5 mLs by Intracatheter route daily.        Follow-up Information     Michael Boston, MD. Go on 07/09/2022.   Specialties: General Surgery, Colon and Rectal Surgery Why: 11:15am for follow up with a surgeon. please arrive 30 minutes early and bring photo ID/insurance information. Contact information: [address] [place] [address] [phone]         Mignon Pine, DO Follow up on 06/22/2022.   Specialties: Infectious Diseases, Internal Medicine Why: 2:30 PM Contact information: [place] [address] [phone]         Juanita Craver, MD. Schedule an appointment as soon as possible for a visit in 1 week(s).   Specialty: Gastroenterology Contact information: [address] [place] [address] [place]  better, tolerating diet  Discharge Exam: Filed Weights   06/11/22 1255 06/11/22 2030  Weight: 58.1 kg 59.5 kg   Physical Exam Vitals reviewed.  Constitutional:      General: She is not in acute distress.    Appearance: She is not ill-appearing or toxic-appearing.  Cardiovascular:     Rate and Rhythm: Normal rate and regular rhythm.     Heart sounds: No murmur heard. Pulmonary:     Effort: Pulmonary effort is normal. No respiratory distress.     Breath sounds: No wheezing, rhonchi or rales.  Abdominal:     Palpations: Abdomen is soft.  Neurological:     Mental Status: She is alert.  Psychiatric:        Mood and Affect: Mood normal.        Behavior: Behavior normal.    CBG stable  Condition at discharge: good  The results of significant diagnostics from this hospitalization (including imaging,  microbiology, ancillary and laboratory) are listed below for reference.   Imaging Studies: VAS [place] LOWER EXTREMITY VENOUS (DVT)  Result Date: 06/12/2022  Lower Venous DVT Study Patient Name:  JAMINE WINGATE  Date of Exam:   06/12/2022 Medical Rec #: [id number]        Accession #:    [id number] Date of Birth: 11-05-1990        Patient Gender: F Patient Age:   31 years Exam [facility]:  [facility] Procedure:      VAS [place] LOWER EXTREMITY VENOUS (DVT) Referring Phys: CHING TU --------------------------------------------------------------------------------  Indications: RLE calf pain.  Comparison Study: No previous exams Performing Technologist: Jody Hill RVT, RDMS  Examination Guidelines: A complete evaluation includes B-mode imaging, spectral Doppler, color Doppler, and power Doppler as needed of all accessible portions of each vessel. Bilateral testing is considered an integral part of a complete examination. Limited examinations for reoccurring indications may be performed as noted. The reflux portion of the exam is performed with the patient in reverse Trendelenburg.  +---------+---------------+---------+-----------+----------+--------------+ RIGHT    CompressibilityPhasicitySpontaneityPropertiesThrombus Aging +---------+---------------+---------+-----------+----------+--------------+ CFV      Full           Yes      Yes                                 +---------+---------------+---------+-----------+----------+--------------+ SFJ      Full                                                        +---------+---------------+---------+-----------+----------+--------------+ FV Prox  Full           Yes      Yes                                 +---------+---------------+---------+-----------+----------+--------------+ FV Mid   Full           Yes      Yes                                 +---------+---------------+---------+-----------+----------+--------------+ FV DistalFull            Yes      Yes                                 +---------+---------------+---------+-----------+----------+--------------+  PFV      Full                                                        +---------+---------------+---------+-----------+----------+--------------+ POP      Full           Yes      Yes                                 +---------+---------------+---------+-----------+----------+--------------+ PTV      Full                                                        +---------+---------------+---------+-----------+----------+--------------+ PERO     Full                                                        +---------+---------------+---------+-----------+----------+--------------+   +----+---------------+---------+-----------+----------+--------------+ LEFTCompressibilityPhasicitySpontaneityPropertiesThrombus Aging +----+---------------+---------+-----------+----------+--------------+ CFV Full           Yes      Yes                                 +----+---------------+---------+-----------+----------+--------------+     Summary: RIGHT: - There is no evidence of deep vein thrombosis in the lower extremity.  - No cystic structure found in the popliteal fossa.  LEFT: - No evidence of common femoral vein obstruction.  *See table(s) above for measurements and observations. Electronically signed by Harold Barban MD on 06/12/2022 at 10:07:59 AM.    Final    CT ABDOMEN PELVIS W CONTRAST  Result Date: 06/11/2022 CLINICAL DATA:  Sepsis, history of Crohn's disease and intra-abdominal abscesses EXAM: CT ABDOMEN AND PELVIS WITH CONTRAST TECHNIQUE: Multidetector CT imaging of the abdomen and pelvis was performed using the standard protocol following bolus administration of intravenous contrast. RADIATION DOSE REDUCTION: This exam was performed according to the departmental dose-optimization program which includes automated exposure control, adjustment of the mA  and/or kV according to patient size and/or use of iterative reconstruction technique. CONTRAST:  7m OMNIPAQUE IOHEXOL 350 MG/ML SOLN COMPARISON:  CT 06/01/2022 FINDINGS: Lower chest: No acute abnormality. Hepatobiliary: No focal liver abnormality is seen. No gallstones, gallbladder wall thickening, or biliary dilatation. Pancreas: Unremarkable. No pancreatic ductal dilatation or surrounding inflammatory changes. Spleen: Normal in size without focal abnormality. Adrenals/Urinary Tract: Adrenal glands are unremarkable. Significant distention of both renal collecting systems and ureters. This is increased on the right since 06/01/2022. This is likely related to compression by inflammatory changes in the pelvis. No calculi are identified. Stomach/Bowel: Interval placement of 2 right-sided percutaneous pigtail drains into the inflammatory process in the right mid and lower abdomen. Multiple peripheral enhancing gas and fluid collections are again redemonstrated. For example the collection around the more inferior drain measuring approximally 2.5 x 3.8 x 5.0 cm (series 2/image 62 and 5/46). This appears more confluent and similar in size  compared to 06/01/2022. Decompressed collection about the more superior drain Redemonstrated marked wall thickening and mucosal hyperenhancement of the terminal ileum and cecum. There is increased dilation of the terminal ileum measuring up to 3.9 cm in diameter and containing feculent material with abrupt transition at the terminal ileum. Hyperemic appendix is normal in caliber. Stomach is unremarkable. Vascular/Lymphatic: No significant vascular abnormality. No evidence of venous thrombosis. Reproductive: Uterus and bilateral adnexa are unremarkable. Other: Slightly increased small volume free fluid in the abdomen and pelvis. Right ileocolic lymphadenopathy is unchanged. Musculoskeletal: No acute or significant osseous findings. IMPRESSION: Interval placement of 2 right-sided  percutaneous drains into the inflammatory process in the right lower abdomen. Overall similar extent of the inflammatory process with multiloculated inflammatory collections in the right lower quadrant. The more inferior drain is well-positioned within the dominant collection. Increased dilation of the terminal ileum containing large volume feculent material with abrupt transition point at the terminal ileum consistent with an inflammatory stricture. Similar marked inflammation of the cecum and terminal ileum. Increased right-sided hydroureteronephrosis with similar hydroureteronephrosis on the left likely due to compression from inflammatory changes in the pelvis. Increased small volume abdominopelvic ascites. Electronically Signed   By: Placido Sou M.D.   On: 06/11/2022 18:00   DG Chest Port 1 View  Result Date: 06/11/2022 CLINICAL DATA:  Possible sepsis. History of Crohn disease. Diabetes. Ex-smoker. EXAM: PORTABLE CHEST 1 VIEW COMPARISON:  05/22/2022 FINDINGS: The heart size and mediastinal contours are within normal limits. Both lungs are clear. The visualized skeletal structures are unremarkable. IMPRESSION: No active disease. Electronically Signed   By: Abigail Miyamoto M.D.   On: 06/11/2022 14:19   US RENAL  Result Date: 06/06/2022 CLINICAL DATA:  Hydronephrosis EXAM: RENAL / URINARY TRACT ULTRASOUND COMPLETE COMPARISON:  CT done on 06/01/2022 FINDINGS: Right Kidney: Renal measurements: 12 x 4.5 by 5.7 cm = volume: 160.9 mL. Is mild dilation of renal pelvis measuring 1.3 cm in diameter without significant dilation of minor calyces. Ureter is not adequately visualized. There is no perinephric fluid collection. Left Kidney: Renal measurements: 15.6 x 4.9 x 5 cm = volume: 198.4 mL. There is prominence of left renal pelvis measuring 2.1 cm in diameter without significant dilation of minor calyces. Ureter is not adequately visualized. Bladder: Ureteral jets were observed on both sides. Other: None.  IMPRESSION: There is prominence of renal pelvis in both kidneys, more so on the left side without significant dilation of minor calices. Findings may be due to extrarenal location of renal pelvis and partial ureteric obstruction related to the inflammatory process in lower abdomen and pelvis seen in the previous CT. Electronically Signed   By: Elmer Picker M.D.   On: 06/06/2022 19:23   CT GUIDED PERITONEAL/RETROPERITONEAL FLUID DRAIN BY PERC CATH  Result Date: 06/03/2022 INDICATION: 31 year old female history of Crohn's disease presenting with abdominal pain and sepsis with CT findings concerning for intra-abdominal abscess associated with the terminal ileum. EXAM: 1. CT PERC DRAIN PERITONEAL ABSCESS 2. CT PERC DRAIN PERITONEAL ABSCESS COMPARISON:  None Available. MEDICATIONS: The patient is currently admitted to the hospital and receiving intravenous antibiotics. The antibiotics were administered within an appropriate time frame prior to the initiation of the procedure. 4 mg Zofran, intravenous ANESTHESIA/SEDATION: Moderate (conscious) sedation was employed during this procedure. A total of Versed 2 mg and Fentanyl 100 mcg was administered intravenously. Moderate Sedation Time: 51 minutes. The patient's level of consciousness and vital signs were monitored continuously by radiology nursing throughout the procedure under my direct  supervision. CONTRAST:  None COMPLICATIONS: None immediate. PROCEDURE: RADIATION DOSE REDUCTION: This exam was performed according to the departmental dose-optimization program which includes automated exposure control, adjustment of the mA and/or kV according to patient size and/or use of iterative reconstruction technique. Informed written consent was obtained from the patient after a discussion of the risks, benefits and alternatives to treatment. The patient was placed supine on the CT gantry and a pre procedural CT was performed re-demonstrating the known abscess/fluid  collections within the location. The procedure was planned. A timeout was performed prior to the initiation of the procedure. The right lower quadrant was prepped and draped in the usual sterile fashion. The overlying soft tissues were anesthetized with 1% lidocaine with epinephrine at the planned needle entry sites. Appropriate trajectories were planned with the use of a 22 gauge spinal needles. Two separate 18 gauge trocar needle were advanced into the abscess/fluid collections and short Amplatz super stiff wires wer coiled within the collections. Appropriate positioning was confirmed with a limited CT scan. The tracts were serially dilated allowing placement of 2, 10 French all-purpose drainage catheters. Appropriate positioning was confirmed with a limited postprocedural CT scan. Approximately 5 mL ml of purulent fluid was aspirated from the right inferior and lateral drain. Approximately 2 mL of purulence and serous fluid was aspirated from the more anterior and superior drain. The tubes were connected to a bulb suction and sutured in place. A dressing was placed. The patient tolerated the procedure well without immediate post procedural complication. IMPRESSION: Successful CT guided placement of 2, 10 French all purpose drain catheters into the right lower quadrant fluid collections with aspiration of small volume purulent fluid. Samples were sent to the laboratory as requested by the ordering clinical team. Ruthann Cancer, MD [facility] Radiology Electronically Signed   By: Ruthann Cancer M.D.   On: 06/03/2022 18:52   CT GUIDED PERITONEAL/RETROPERITONEAL FLUID DRAIN BY PERC CATH  Result Date: 06/03/2022 INDICATION: 31 year old female history of Crohn's disease presenting with abdominal pain and sepsis with CT findings concerning for intra-abdominal abscess associated with the terminal ileum. EXAM: 1. CT PERC DRAIN PERITONEAL ABSCESS 2. CT PERC DRAIN PERITONEAL  ABSCESS COMPARISON:  None Available. MEDICATIONS: The patient is currently admitted to the hospital and receiving intravenous antibiotics. The antibiotics were administered within an appropriate time frame prior to the initiation of the procedure. 4 mg Zofran, intravenous ANESTHESIA/SEDATION: Moderate (conscious) sedation was employed during this procedure. A total of Versed 2 mg and Fentanyl 100 mcg was administered intravenously. Moderate Sedation Time: 51 minutes. The patient's level of consciousness and vital signs were monitored continuously by radiology nursing throughout the procedure under my direct supervision. CONTRAST:  None COMPLICATIONS: None immediate. PROCEDURE: RADIATION DOSE REDUCTION: This exam was performed according to the departmental dose-optimization program which includes automated exposure control, adjustment of the mA and/or kV according to patient size and/or use of iterative reconstruction technique. Informed written consent was obtained from the patient after a discussion of the risks, benefits and alternatives to treatment. The patient was placed supine on the CT gantry and a pre procedural CT was performed re-demonstrating the known abscess/fluid collections within the location. The procedure was planned. A timeout was performed prior to the initiation of the procedure. The right lower quadrant was prepped and draped in the usual sterile fashion. The overlying soft tissues were anesthetized with 1% lidocaine with epinephrine at the planned needle entry sites. Appropriate trajectories were planned with the use of a  22 gauge spinal needles. Two separate 18 gauge trocar needle were advanced into the abscess/fluid collections and short Amplatz super stiff wires wer coiled within the collections. Appropriate positioning was confirmed with a limited CT scan. The tracts were serially dilated allowing placement of 2, 10 French all-purpose drainage catheters. Appropriate positioning was  confirmed with a limited postprocedural CT scan. Approximately 5 mL ml of purulent fluid was aspirated from the right inferior and lateral drain. Approximately 2 mL of purulence and serous fluid was aspirated from the more anterior and superior drain. The tubes were connected to a bulb suction and sutured in place. A dressing was placed. The patient tolerated the procedure well without immediate post procedural complication. IMPRESSION: Successful CT guided placement of 2, 10 French all purpose drain catheters into the right lower quadrant fluid collections with aspiration of small volume purulent fluid. Samples were sent to the laboratory as requested by the ordering clinical team. Ruthann Cancer, MD [facility] Radiology Electronically Signed   By: Ruthann Cancer M.D.   On: 06/03/2022 18:52   CT ABDOMEN PELVIS W CONTRAST  Result Date: 06/01/2022 CLINICAL DATA:  Right lower quadrant pain for 3 days, history of Crohn's disease EXAM: CT ABDOMEN AND PELVIS WITH CONTRAST TECHNIQUE: Multidetector CT imaging of the abdomen and pelvis was performed using the standard protocol following bolus administration of intravenous contrast. RADIATION DOSE REDUCTION: This exam was performed according to the departmental dose-optimization program which includes automated exposure control, adjustment of the mA and/or kV according to patient size and/or use of iterative reconstruction technique. CONTRAST:  122m OMNIPAQUE IOHEXOL 300 MG/ML  SOLN COMPARISON:  05/03/2022 FINDINGS: Lower chest: No acute abnormality. Hepatobiliary: No focal liver abnormality is seen. No gallstones, gallbladder wall thickening, or biliary dilatation. Pancreas: Unremarkable. No pancreatic ductal dilatation or surrounding inflammatory changes. Spleen: Normal in size without focal abnormality. Adrenals/Urinary Tract: Adrenal glands are within normal limits. Kidneys demonstrate a normal enhancement pattern  bilaterally. The bladder is incompletely distended. There is however significant distension of the collecting systems and ureters bilaterally likely related to local compression by inflammatory changes in the pelvis. These changes are new from the prior exam. No calculi are identified. Stomach/Bowel: Mild fecal material is noted within the colon. The cecum and distal terminal ileum demonstrates significant inflammatory change much worse than that noted on the prior exam consistent with the known history of Crohn's. The appendix is within normal limits. The distal most aspect of the small bowel is mildly dilated related to the inflammatory changes. A few small air-fluid collections are identified 1 seen on image number 55 of series 2 measuring 2.2 cm and a second measuring 2.3 cm on image number 62 of series 2. These are highly suspicious for focal abscesses and new from the prior exam. An additional area measuring up to 3 cm best seen on image number 64 of series 2 and image number 43 of series 5 is also suspicious for abscess. More proximal small bowel is within normal limits. The stomach is unremarkable. Vascular/Lymphatic: No significant vascular findings are present. No enlarged abdominal or pelvic lymph nodes. Reproductive: Uterus and bilateral adnexa are unremarkable. Other: No hernia is noted.  Free fluid is noted within the pelvis. Musculoskeletal: No acute or significant osseous findings. IMPRESSION: Significant progression of inflammatory change in the right lower quadrant when compared with the prior exam. Multiple small enhancing air-fluid collections are identified suspicious for abscesses and likely related to prior perforation. Free pelvic fluid is noted  as well. These changes are consistent with the given clinical history of Crohn's and likely represent an acute flare with perforation and abscess formation. Bilateral hydronephrosis and hydroureter worse on the left than the right likely related to  local compression related to the inflammatory changes. Electronically Signed   By: Inez Catalina M.D.   On: 06/01/2022 20:19   DG Chest Port 1 View  Result Date: 05/22/2022 CLINICAL DATA:  Two days of lower abdominal pain, history of Crohn's disease EXAM: PORTABLE CHEST 1 VIEW COMPARISON:  Radiographs 06/02/2012 FINDINGS: The heart size and mediastinal contours are within normal limits. Both lungs are clear. The visualized skeletal structures are unremarkable. No free air under the diaphragms on semi-erect AP radiograph. IMPRESSION: No acute abnormality. Electronically Signed   By: Placido Sou M.D.   On: 05/22/2022 14:41    Microbiology: Results for orders placed or performed during the hospital encounter of 06/11/22  Blood Culture (routine x 2)     Status: None   Collection Time: 06/11/22  1:35 PM   Specimen: BLOOD  Result Value Ref Range Status   Specimen Description   Final    BLOOD BLOOD RIGHT FOREARM Performed at [facility], [place] [address], [place], [place] [address]    Special Requests   Final    BOTTLES DRAWN AEROBIC AND ANAEROBIC Blood Culture adequate volume Performed at [facility] [address], [place], [place] [address]    Culture   Final    NO GROWTH 5 DAYS Performed at [facility], [place] [address], [place], [place] [address]    Report Status 06/16/2022 FINAL  Final  Blood Culture (routine x 2)     Status: None   Collection Time: 06/11/22  1:40 PM   Specimen: BLOOD  Result Value Ref Range Status   Specimen Description   Final    BLOOD LEFT ANTECUBITAL Performed at [facility] [address], [place], [place] [address]    Special Requests   Final    BOTTLES DRAWN AEROBIC AND ANAEROBIC Blood Culture results may not be optimal due to an inadequate volume of blood received in culture bottles Performed at [facility] [address], [place], [place] [address]    Culture   Final     NO GROWTH 5 DAYS Performed at [facility], [place] [address], [place], [place] [address]    Report Status 06/16/2022 FINAL  Final  Urine Culture     Status: Abnormal   Collection Time: 06/11/22  4:43 PM   Specimen: In/Out Cath Urine  Result Value Ref Range Status   Specimen Description   Final    IN/OUT CATH URINE Performed at [facility] [address] [place], [place] [address]    Special Requests   Final    NONE Performed at [facility], [place] [address] [place], [place] [address]    Culture (A)  Final    <10,000 COLONIES/mL INSIGNIFICANT GROWTH Performed at [facility] [address], [place],  [address]    Report Status 06/13/2022 FINAL  Final  C Difficile Quick Screen w PCR reflex     Status: None   Collection Time: 06/12/22  2:55 PM   Specimen: STOOL  Result Value Ref Range Status   C Diff antigen NEGATIVE NEGATIVE Final   C Diff toxin NEGATIVE NEGATIVE Final   C Diff interpretation No C. difficile detected.  Final  Comment: Performed at [facility], [place] [address] [place], [place] [address]    Labs: CBC: Recent Labs  Lab 06/11/22 1335 06/11/22 1352 06/12/22 0646 06/13/22 0753  WBC 13.1*  --  10.9* 5.7  NEUTROABS 11.0*  --   --   --   HGB 11.4* 12.2 9.3* 9.2*  HCT 34.9* 36.0 28.7* 28.2*  MCV 84.9  --  83.7 83.9  PLT 515*  --  436* 249*   Basic Metabolic Panel: Recent Labs  Lab 06/11/22 1335 06/11/22 1352 06/12/22 0646 06/13/22 0753 06/14/22 0605 06/15/22 0529  NA 138 137 137 137 137 139  K 3.4* 4.4 2.9* 3.3* 2.9* 3.7  CL 107 102 106 105 103 108  CO2 24  --  _0 GLUCOSE 120* 114* 97 188* 217* 155*  BUN <5* <3* <5* <5* 7 13  CREATININE 0.49 0.40* 0.52 0.58 0.56 0.61  CALCIUM 9.1  --  8.3* 8.4* 8.6* 8.3*  MG  --   --  1.9  --  2.2  --   PHOS  --   --   --   --  3.1  --    Liver Function Tests: Recent Labs  Lab 06/11/22 1335 06/13/22 0753  AST 11*  7*  ALT 14 7  ALKPHOS 59 50  BILITOT 0.4 0.3  PROT 7.1 6.3*  ALBUMIN 3.0* 2.3*   CBG: Recent Labs  Lab 06/15/22 1210 06/15/22 1647 06/15/22 2214 06/16/22 0749 06/16/22 1207  GLUCAP 242* 249* 221* 139* 243*    Discharge time spent: greater than 30 minutes.  Signed: Murray Hodgkins, MD [facility] 06/16/2022

## 2022-06-16 NOTE — Progress Notes (Signed)
[facility] Progress Note     Subjective: CC:  No acute changes, some pain around drain but overall feeling well. Tolerating solid food.   Objective: Vital signs in last 24 hours: Temp:  [97.7 F (36.5 C)-98 F (36.7 C)] 97.7 F (36.5 C) (08/19 0422) Pulse Rate:  [51-69] 51 (08/19 0422) Resp:  [16-18] 16 (08/19 0422) BP: (102-119)/(66-78) 112/70 (08/19 0422) SpO2:  [100 %] 100 % (08/19 0422) Last BM Date : 06/14/22 (per pt report)  Intake/Output from previous day: 08/18 0701 - 08/19 0700 In: 196.9 [P.O.:120; IV Piggyback:76.9] Out: -  Intake/Output this shift: No intake/output data recorded.  PE: Gen:  Alert, NAD, cooperative Pulm:  Normal effort on room air Abd: Soft, minimally distended, nontender.  IR drain - scant thin brown fluid in bulb Skin: warm and dry, no rashes  Psych: A&Ox3   Lab Results:  No results for input(s): "WBC", "HGB", "HCT", "PLT" in the last 72 hours.  BMET Recent Labs    06/14/22 0605 06/15/22 0529  NA 137 139  K 2.9* 3.7  CL 103 108  CO2 26 27  GLUCOSE 217* 155*  BUN 7 13  CREATININE 0.56 0.61  CALCIUM 8.6* 8.3*   PT/INR No results for input(s): "LABPROT", "INR" in the last 72 hours.  CMP     Component Value Date/Time   NA 139 06/15/2022 0529   K 3.7 06/15/2022 0529   CL 108 06/15/2022 0529   CO2 27 06/15/2022 0529   GLUCOSE 155 (H) 06/15/2022 0529   BUN 13 06/15/2022 0529   CREATININE 0.61 06/15/2022 0529   CALCIUM 8.3 (L) 06/15/2022 0529   PROT 6.3 (L) 06/13/2022 0753   ALBUMIN 2.3 (L) 06/13/2022 0753   AST 7 (L) 06/13/2022 0753   ALT 7 06/13/2022 0753   ALKPHOS 50 06/13/2022 0753   BILITOT 0.3 06/13/2022 0753   GFRNONAA >60 06/15/2022 0529   GFRAA >60 08/28/2015 2150   Lipase     Component Value Date/Time   LIPASE 26 06/01/2022 1529       Studies/Results: No results found.  Anti-infectives: Anti-infectives (From admission, onward)    Start     Dose/Rate Route Frequency Ordered Stop    06/12/22 0900  piperacillin-tazobactam (ZOSYN) IVPB 3.375 g        3.375 g 12.5 mL/hr over 240 Minutes Intravenous Every 8 hours 06/12/22 0010     06/12/22 0100  piperacillin-tazobactam (ZOSYN) IVPB 3.375 g        3.375 g 12.5 mL/hr over 240 Minutes Intravenous STAT 06/12/22 0009 06/12/22 0801        Assessment/Plan  Crohn's disease with abscess - previous hospital stay 8/4-8/11, IR drains placed 8/5, Cx w/ mult organisms. She has been on Augmentin at home per ID recs (4 weeks total duration of tx) -  fever and leukocytosis resolved - clinically she is not obstructed but her CT scan does show some small bowel dilation and fecalization proximal to the TI, consistent with pSBO. Having bowel function, on Miralax. - Seen by Dr. Elnoria Howard - transitioned to infliximab and solumedrol. Appreciate GI following. - No emergent general surgery needs today, patient has improved with medical management. - From a surgical standpoint patient is safe for discharge home with outpatient follow up. She has follow up with Dr. Michaell Cowing scheduled on 9/11.  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   Sophronia Simas, MD [facility] Surgery General, Hepatobiliary and Pancreatic Surgery 06/16/22 8:09 AM

## 2022-06-16 NOTE — Progress Notes (Signed)
[facility] Progress Note (Weekend cross coverage for Dr. Elnoria Howard) Chief Complaint: Crohn's disease with bowel obstruction  History: Signout received from Dr. Elnoria Howard and additional chart review performed. She has no abdominal pain.  There is some flank pain at the site of her drain since she inadvertently tugged on it this morning trying to get out of bed.  Passing small liquid to semiformed stool.  Has been tolerating solid food the last few days.  Surgical service notes reviewed as well.  She is excited to be heading home.  ROS: Cardiovascular: No chest pain Respiratory: No dyspnea Urinary: No dysuria  Objective:   Current Facility-Administered Medications:    acetaminophen (TYLENOL) 160 MG/5ML solution 500 mg, 500 mg, Oral, Q6H PRN, Tu, Ching T, DO, 500 mg at 06/12/22 1715   enoxaparin (LOVENOX) injection 40 mg, 40 mg, Subcutaneous, QHS, Wofford, Drew A, RPH, 40 mg at 06/15/22 2134   feeding supplement (ENSURE ENLIVE / ENSURE PLUS) liquid 237 mL, 237 mL, Oral, TID WC, Simaan, Elizabeth S, PA-C, 237 mL at 06/16/22 1227   HYDROmorphone (DILAUDID) injection 1 mg, 1 mg, Intravenous, Q3H PRN, Tu, Ching T, DO, 1 mg at 06/16/22 1227   insulin aspart (novoLOG) injection 0-9 Units, 0-9 Units, Subcutaneous, TID WC, Tu, Ching T, DO, 3 Units at 06/16/22 1226   morphine (PF) 2 MG/ML injection 1 mg, 1 mg, Intravenous, Q3H PRN, Tu, Ching T, DO, 1 mg at 06/12/22 0453   pantoprazole (PROTONIX) EC tablet 40 mg, 40 mg, Oral, Daily, Tu, Ching T, DO, 40 mg at 06/16/22 0811   piperacillin-tazobactam (ZOSYN) IVPB 3.375 g, 3.375 g, Intravenous, Q8H, Poindexter, Leann T, RPH, Last Rate: 12.5 mL/hr at 06/16/22 0813, 3.375 g at 06/16/22 0813   polyethylene glycol (MIRALAX / GLYCOLAX) packet 17 g, 17 g, Oral, Daily, Simaan, Elizabeth S, PA-C, 17 g at 06/16/22 4128   predniSONE (DELTASONE) tablet 40 mg, 40 mg, Oral, Q breakfast, Jeani Hawking, MD, 40 mg at 06/16/22 0811   piperacillin-tazobactam (ZOSYN)  IV 3.375 g  (06/16/22 0813)     Vital signs in last 24 hrs: Vitals:   06/15/22 2005 06/16/22 0422  BP: 119/78 112/70  Pulse: (!) 54 (!) 51  Resp: 18 16  Temp: 98 F (36.7 C) 97.7 F (36.5 C)  SpO2: 100% 100%    Intake/Output Summary (Last 24 hours) at 06/16/2022 1245 Last data filed at 06/16/2022 0800 Gross per 24 hour  Intake 404.11 ml  Output --  Net 404.11 ml     Physical Exam  Looks well, laying in bed, in good spirits Cardiac: RRR without murmurs, S1S2 heard, no peripheral edema Pulm: clear to auscultation bilaterally, normal RR and effort noted Abdomen: soft, no tenderness, with active bowel sounds. No guarding or palpable hepatosplenomegaly.  Right flank JP drain with serous fluid   Recent Labs:     Latest Ref Rng & Units 06/13/2022    7:53 AM 06/12/2022    6:46 AM 06/11/2022    1:52 PM  CBC  WBC 4.0 - 10.5 K/uL 5.7  10.9    Hemoglobin 12.0 - 15.0 g/dL 9.2  9.3  78.6   Hematocrit 36.0 - 46.0 % 28.2  28.7  36.0   Platelets 150 - 400 K/uL 511  436      Recent Labs  Lab 06/11/22 1335  INR 1.2      Latest Ref Rng & Units 06/15/2022    5:29 AM 06/14/2022    6:05 AM 06/13/2022    7:53 AM  CMP  Glucose 70 - 99 mg/dL 009  233  007   BUN 6 - 20 mg/dL 13  7  <5   Creatinine 0.44 - 1.00 mg/dL 6.22  6.33  3.54   Sodium 135 - 145 mmol/L 139  137  137   Potassium 3.5 - 5.1 mmol/L 3.7  2.9  3.3   Chloride 98 - 111 mmol/L 108  103  105   CO2 22 - 32 mmol/L 27  26  25    Calcium 8.9 - 10.3 mg/dL 8.3  8.6  8.4   Total Protein 6.5 - 8.1 g/dL   6.3   Total Bilirubin 0.3 - 1.2 mg/dL   0.3   Alkaline Phos 38 - 126 U/L   50   AST 15 - 41 U/L   7   ALT 0 - 44 U/L   7      Radiologic studies:   Assessment & Plan  Assessment: Ileal Crohn's disease with bowel obstruction and abscess with peripheral drain on antibiotics and prednisone.  She failed Humira and was started on infliximab this infusion with good response so far.   Plan: She is being discharged today for outpatient  follow-up with GI and surgery. Continue prednisone 40 mg/day. I will give signout to Dr. about the patient's discharge so his office can contact this patient early next week with plans for office follow-up and prednisone taper instructions.  Elnoria Howard III Office: [phone]

## 2022-06-22 ENCOUNTER — Ambulatory Visit (INDEPENDENT_AMBULATORY_CARE_PROVIDER_SITE_OTHER): Payer: Medicaid Other | Admitting: Internal Medicine

## 2022-06-22 ENCOUNTER — Other Ambulatory Visit: Payer: Self-pay

## 2022-06-22 ENCOUNTER — Encounter: Payer: Self-pay | Admitting: Internal Medicine

## 2022-06-22 VITALS — BP 105/68 | HR 77 | Resp 16 | Ht 66.0 in | Wt 126.9 lb

## 2022-06-22 DIAGNOSIS — K651 Peritoneal abscess: Secondary | ICD-10-CM | POA: Diagnosis not present

## 2022-06-22 DIAGNOSIS — K50914 Crohn's disease, unspecified, with abscess: Secondary | ICD-10-CM | POA: Diagnosis not present

## 2022-06-22 DIAGNOSIS — Z298 Encounter for other specified prophylactic measures: Secondary | ICD-10-CM

## 2022-06-22 MED ORDER — SULFAMETHOXAZOLE-TRIMETHOPRIM 800-160 MG PO TABS: 1.0000 | ORAL_TABLET | ORAL | 2 refills | Status: DC

## 2022-06-22 NOTE — Patient Instructions (Signed)
Thank you for coming to see me today. It was a pleasure seeing you.  To Do: Take Augmentin twice per day I also prescribed Bactrim 1 DS Tablet to take Three Times per Week Follow up as scheduled  If you have any questions or concerns, please do not hesitate to call the office at [phone].  Take Care,   Gwynn Burly

## 2022-06-22 NOTE — Assessment & Plan Note (Signed)
Her Crohn's disease has been very hard to manage and she has failed prior regimen of Humira.  She was started on Remicade during this last admission and is on high dose prednisone taper.  Following with GI as an outpatient.

## 2022-06-22 NOTE — Progress Notes (Signed)
[name] for Infectious Disease  CHIEF COMPLAINT:    Follow up for intra-abdominal infection  SUBJECTIVE:    Sandra Foster is a 31 y.o. female with PMHx as below who presents to the clinic for intra-abdominal infection.   Patient recently admitted 06/01/22 to 5/36/14 with complicated intra-abdominal infection due to Crohn's flare with perforation, IAA.  She underwent CT guided drain placement x 2 by IR on 06/02/22.  ID consulted and discharged home on Augmentin x 4 weeks through 07/09/22.  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  Seen by GI and gen surgery during recent admit.  Started on Remicade.  Re-evaluated by IR as well and 1 of her drains was removed 06/12/22.  Continued with right lateral drain and to be followed by IR outpatient. Presents today for follow up and reports no fevers, chills.  She is sometimes feeling achy.  She had a great day a couple days ago but a little more tired today.  She does have some abdominal pain still.    Please see A&P for the details of today's visit and status of the patient's medical problems.   Patient's Medications  New Prescriptions   SULFAMETHOXAZOLE-TRIMETHOPRIM (BACTRIM DS) 800-160 MG TABLET    Take 1 tablet by mouth 3 (three) times a week.  Previous Medications   ACETAMINOPHEN (TYLENOL) 500 MG TABLET    Take 1 tablet (500 mg total) by mouth every 6 (six) hours as needed.   AMOXICILLIN-CLAVULANATE (AUGMENTIN) 875-125 MG TABLET    Take 1 tablet by mouth 2 (two) times daily.   BLOOD GLUCOSE METER KIT AND SUPPLIES    Dispense based on patient and insurance preference. Use up to four times daily as directed. (FOR ICD-10 E10.9, E11.9).   CARBOXYMETHYLCELLULOSE (REFRESH PLUS) 0.5 % SOLN    Place 1 drop into both eyes daily as needed (dry eyes).   ETONOGESTREL (NEXPLANON) 68 MG IMPL IMPLANT    1 each by Subdermal route once.   INSULIN  GLARGINE (LANTUS) 100 UNIT/ML INJECTION    Inject 0.09 mLs (9 Units total) into the skin daily.   INSULIN PEN NEEDLE (PEN NEEDLES) 30G X 5 MM MISC    1 Pen by Does not apply route 4 (four) times daily -  before meals and at bedtime.   OXYCODONE (OXY IR/ROXICODONE) 5 MG IMMEDIATE RELEASE TABLET    Take 1-2 tablets (5-10 mg total) by mouth every 6 (six) hours as needed for severe pain or moderate pain.   PANTOPRAZOLE (PROTONIX) 40 MG TABLET    Take 1 tablet (40 mg total) by mouth daily.   POLYETHYLENE GLYCOL (MIRALAX / GLYCOLAX) 17 G PACKET    Take 17 g by mouth daily.   PREDNISONE (DELTASONE) 20 MG TABLET    Take 2 tablets (40 mg total) by mouth daily with breakfast.   SODIUM CHLORIDE FLUSH (NS) 0.9 % SOLN    5 mLs by Intracatheter route daily.  Modified Medications   No medications on file  Discontinued Medications   No medications on file      Past Medical History:  Diagnosis Date   Chlamydia    Crohn's disease ([place])    Diabetes mellitus without complication ([place])    Fetal demise    hyperglycemic hyperosmolar nonketonic syndrome   Hernia    Pregnancy induced hypertension    Pyelonephritis     Social History   Tobacco Use  Smoking status: Former    Packs/day: 0.25    Types: Cigarettes   Smokeless tobacco: Former    Quit date: 06/10/2011  Vaping Use   Vaping Use: Some days   Substances: Nicotine  Substance Use Topics   Alcohol use: No   Drug use: No    Family History  Problem Relation Age of Onset   Hypertension Maternal Grandmother     Allergies  Allergen Reactions   Nsaids Other (See Comments)    Crohn's disease = NO NSAIDs    Review of Systems  All other systems reviewed and are negative.  Except as noted above.   OBJECTIVE:    Vitals:   06/22/22 1435  BP: 105/68  Pulse: 77  Resp: 16  SpO2: 100%  Weight: 126 lb 14.4 oz (57.6 kg)  Height: $Remove'5\' 6"'HLReYhX$  (1.676 m)   Body mass index is 20.48 kg/m.  Physical Exam Constitutional:      Appearance: Normal  appearance.  HENT:     Head: Normocephalic and atraumatic.  Eyes:     Extraocular Movements: Extraocular movements intact.     Conjunctiva/sclera: Conjunctivae normal.  Pulmonary:     Effort: Pulmonary effort is normal. No respiratory distress.  Abdominal:     General: There is no distension.     Palpations: Abdomen is soft.     Comments: IR drain remains in place.   Musculoskeletal:     Cervical back: Normal range of motion and neck supple.  Skin:    General: Skin is warm and dry.  Neurological:     General: No focal deficit present.     Mental Status: She is alert and oriented to person, place, and time.  Psychiatric:        Mood and Affect: Mood normal.        Behavior: Behavior normal.      Labs and Microbiology:    Latest Ref Rng & Units 06/13/2022    7:53 AM 06/12/2022    6:46 AM 06/11/2022    1:52 PM  CBC  WBC 4.0 - 10.5 K/uL 5.7  10.9    Hemoglobin 12.0 - 15.0 g/dL 9.2  9.3  12.2   Hematocrit 36.0 - 46.0 % 28.2  28.7  36.0   Platelets 150 - 400 K/uL 511  436        Latest Ref Rng & Units 06/15/2022    5:29 AM 06/14/2022    6:05 AM 06/13/2022    7:53 AM  CMP  Glucose 70 - 99 mg/dL 155  217  188   BUN 6 - 20 mg/dL 13  7  <5   Creatinine 0.44 - 1.00 mg/dL 0.61  0.56  0.58   Sodium 135 - 145 mmol/L 139  137  137   Potassium 3.5 - 5.1 mmol/L 3.7  2.9  3.3   Chloride 98 - 111 mmol/L 108  103  105   CO2 22 - 32 mmol/L $RemoveB'27  26  25   '[id number]$ Calcium 8.9 - 10.3 mg/dL 8.3  8.6  8.4   Total Protein 6.5 - 8.1 g/dL   6.3   Total Bilirubin 0.3 - 1.2 mg/dL   0.3   Alkaline Phos 38 - 126 U/L   50   AST 15 - 41 U/L   7   ALT 0 - 44 U/L   7      Recent Results (from the past 240 hour(s))  C Difficile Quick Screen w PCR reflex     Status:  None   Collection Time: 06/12/22  2:55 PM   Specimen: STOOL  Result Value Ref Range Status   C Diff antigen NEGATIVE NEGATIVE Final   C Diff toxin NEGATIVE NEGATIVE Final   C Diff interpretation No C. difficile detected.  Final    Comment:  Performed at [facility], [place] [address] [place], [place] [address]      ASSESSMENT & PLAN:    Intra-abdominal abscess [facility]) She remains on Augmentin for her polymicrobial intra-abdominal abscesses s/p IR drain placement x 2 on 06/02/22.  Subsequently had 1 of the drains removed 06/12/22 by IR and has follow up drain study scheduled for 07/06/22.  She reports today that her drain output has been < 105mL per day for the past few days so I encouraged her to contact IR as they may want to do a drain evaluation sooner than 07/06/22.  She also has only been taking Augmentin 1 time per day so we discussed that it should be taken twice daily.  For now, tentative end date for Augmentin is 07/09/22 although may need extension pending IR follow up and repeat imaging.  Will have her follow up with [place] on 07/09/22.  I will not be in clinic at that time so will have her seen another provider.   Acute Crohn's disease ([place]) Her Crohn's disease has been very hard to manage and she has failed prior regimen of Humira.  She was started on Remicade during this last admission and is on high dose prednisone taper.  Following with GI as an outpatient.   Need for pneumocystis prophylaxis She appears to be on a dose of prednisone > 20 mg that is planned for more than 1 month.  Currently not on pneumocystis prophylaxis so will start her on Bactrim 1 DS TIW until she tapers off high dose prednisone.         Raynelle Highland for Infectious Disease [facility] 06/22/2022, 2:54 PM  I have personally spent 40 minutes involved in face-to-face and non-face-to-face activities for this patient on the day of the visit. Professional time spent includes the following activities: Preparing to see the patient (review of tests), Obtaining and/or reviewing separately obtained history (admission/discharge record), Performing a medically appropriate examination and/or evaluation , Ordering  medications/tests/procedures, referring and communicating with other health care professionals, Documenting clinical information in the EMR, Independently interpreting results (not separately reported), Communicating results to the patient/family/caregiver, Counseling and educating the patient/family/caregiver and Care coordination (not separately reported).

## 2022-06-22 NOTE — Assessment & Plan Note (Signed)
She appears to be on a dose of prednisone > 20 mg that is planned for more than 1 month.  Currently not on pneumocystis prophylaxis so will start her on Bactrim 1 DS TIW until she tapers off high dose prednisone.

## 2022-06-22 NOTE — Assessment & Plan Note (Signed)
She remains on Augmentin for her polymicrobial intra-abdominal abscesses s/p IR drain placement x 2 on 06/02/22.  Subsequently had 1 of the drains removed 06/12/22 by IR and has follow up drain study scheduled for 07/06/22.  She reports today that her drain output has been < 67mL per day for the past few days so I encouraged her to contact IR as they may want to do a drain evaluation sooner than 07/06/22.  She also has only been taking Augmentin 1 time per day so we discussed that it should be taken twice daily.  For now, tentative end date for Augmentin is 07/09/22 although may need extension pending IR follow up and repeat imaging.  Will have her follow up with [place] on 07/09/22.  I will not be in clinic at that time so will have her seen another provider.

## 2022-06-28 ENCOUNTER — Ambulatory Visit
Admission: RE | Admit: 2022-06-28 | Discharge: 2022-06-28 | Disposition: A | Discharge status: Home / Self Care | Payer: Medicaid Other | Source: Ambulatory Visit | Attending: Surgery | Admitting: Surgery

## 2022-06-28 ENCOUNTER — Encounter: Payer: Self-pay | Admitting: Radiology

## 2022-06-28 ENCOUNTER — Ambulatory Visit
Admission: RE | Admit: 2022-06-28 | Discharge: 2022-06-28 | Disposition: A | Discharge status: Home / Self Care | Payer: Medicaid Other | Source: Ambulatory Visit | Attending: Radiology | Admitting: Radiology

## 2022-06-28 ENCOUNTER — Other Ambulatory Visit (HOSPITAL_COMMUNITY): Payer: Self-pay

## 2022-06-28 ENCOUNTER — Other Ambulatory Visit: Payer: Self-pay | Admitting: Surgery

## 2022-06-28 DIAGNOSIS — K50014 Crohn's disease of small intestine with abscess: Secondary | ICD-10-CM

## 2022-06-28 DIAGNOSIS — K651 Peritoneal abscess: Secondary | ICD-10-CM

## 2022-06-28 HISTORY — PX: IR RADIOLOGIST EVAL & MGMT: IMG5224

## 2022-06-28 MED ORDER — IOPAMIDOL (ISOVUE-300) INJECTION 61%
100.0000 mL | Freq: Once | INTRAVENOUS | Status: AC | PRN
  Administered 2022-06-28: 100 mL via INTRAVENOUS

## 2022-06-28 MED ORDER — SODIUM CHLORIDE 0.9% FLUSH
5.0000 mL | INTRAVENOUS | 0 refills | Status: DC | PRN
  Filled 2022-06-28: qty 100, 20d supply, fill #0
  Filled 2022-07-03: qty 100, 10d supply, fill #0

## 2022-06-28 NOTE — Progress Notes (Signed)
Referring Physician(s): Gross,Steven  Chief Complaint: The patient is seen in follow up today s/p drainage of intraabdominal abscesses.    History of present illness:  Pt underwent R anterior/lateral drain placements 8/5. She was d/c home 8/11. Pt returned to ED 8/14 with abdominal pain and re-admitted. Pt had anterior drain removed 8/15.  She was discharged and remains on antibiotics.  She is flushing drain daily, has output of approximately 5cc daily. ID recommended seeing IR for evaluation prior to originally scheduled clinic follow up due to decreased OP.  She denies fever, chills, N/V/D and endorses achiness of the suprapubic region.     Past Medical History:  Diagnosis Date   Chlamydia    Crohn's disease ([place])    Diabetes mellitus without complication ([place])    Fetal demise    hyperglycemic hyperosmolar nonketonic syndrome   Hernia    Pregnancy induced hypertension    Pyelonephritis     Past Surgical History:  Procedure Laterality Date   BIOPSY  01/09/2022   Procedure: BIOPSY;  Surgeon: Carol Leauna, MD;  Location: [facility] ENDOSCOPY;  Service: Gastroenterology;;   COLONOSCOPY WITH PROPOFOL N/A 01/09/2022   Procedure: COLONOSCOPY WITH PROPOFOL;  Surgeon: Carol Imani, MD;  Location: [place];  Service: Gastroenterology;  Laterality: N/A;   HERNIA REPAIR      Allergies: Nsaids  Medications: Prior to Admission medications   Medication Sig Start Date End Date Taking? Authorizing Provider  acetaminophen (TYLENOL) 500 MG tablet Take 1 tablet (500 mg total) by mouth every 6 (six) hours as needed. 05/22/22   Varney Biles, MD  amoxicillin-clavulanate (AUGMENTIN) 875-125 MG tablet Take 1 tablet by mouth 2 (two) times daily. 06/08/22 07/08/22  Regalado, Jerald Kief A, MD  blood glucose meter kit and supplies Dispense based on patient and insurance preference. Use up to four times daily as directed. (FOR ICD-10 E10.9, E11.9). 04/26/22   Mercy Riding, MD  carboxymethylcellulose  (REFRESH PLUS) 0.5 % SOLN Place 1 drop into both eyes daily as needed (dry eyes).    [provider]  etonogestrel (NEXPLANON) 68 MG IMPL implant 1 each by Subdermal route once.    [provider]  insulin glargine (LANTUS) 100 UNIT/ML injection Inject 0.09 mLs (9 Units total) into the skin daily. 06/16/22   Samuella Cota, MD  Insulin Pen Needle (PEN NEEDLES) 30G X 5 MM MISC 1 Pen by Does not apply route 4 (four) times daily -  before meals and at bedtime. 04/26/22   Mercy Riding, MD  oxyCODONE (OXY IR/ROXICODONE) 5 MG immediate release tablet Take 1-2 tablets (5-10 mg total) by mouth every 6 (six) hours as needed for severe pain or moderate pain. 06/16/22   Samuella Cota, MD  pantoprazole (PROTONIX) 40 MG tablet Take 1 tablet (40 mg total) by mouth daily. 06/08/22 07/08/22  Regalado, Belkys A, MD  polyethylene glycol (MIRALAX / GLYCOLAX) 17 g packet Take 17 g by mouth daily. 06/09/22   Regalado, Belkys A, MD  predniSONE (DELTASONE) 20 MG tablet Take 2 tablets (40 mg total) by mouth daily with breakfast. 06/17/22   Samuella Cota, MD  sodium chloride flush (NS) 0.9 % SOLN 5 mLs by Intracatheter route daily. 06/08/22   Regalado, Belkys A, MD  sulfamethoxazole-trimethoprim (BACTRIM DS) 800-160 MG tablet Take 1 tablet by mouth 3 (three) times a week. 06/22/22 09/14/22  Mignon Pine, DO  amitriptyline (ELAVIL) 25 MG tablet Take 1-2 tablets (25-50 mg total) by mouth at bedtime as needed (headache). 03/25/20  06/18/20  Gregor Hams, MD     Family History  Problem Relation Age of Onset   Hypertension Maternal Grandmother     Social History   Socioeconomic History   Marital status: Significant Other    Spouse name: Not on file   Number of children: Not on file   Years of education: Not on file   Highest education level: Not on file  Occupational History   Not on file  Tobacco Use   Smoking status: Former    Packs/day: 0.25    Types: Cigarettes   Smokeless tobacco:  Former    Quit date: 06/10/2011  Vaping Use   Vaping Use: Some days   Substances: Nicotine  Substance and Sexual Activity   Alcohol use: No   Drug use: No   Sexual activity: Yes    Birth control/protection: None  Other Topics Concern   Not on file  Social History Narrative   Not on file   Social Determinants of Health   Financial Resource Strain: Not on file  Food Insecurity: Not on file  Transportation Needs: Not on file  Physical Activity: Not on file  Stress: Not on file  Social Connections: Not on file     Vital Signs: There were no vitals taken for this visit.  Physical Exam Constitutional:      General: She is not in acute distress.    Appearance: Normal appearance. She is not toxic-appearing.  HENT:     Head: Normocephalic and atraumatic.     Mouth/Throat:     Pharynx: Oropharynx is clear.  Eyes:     Conjunctiva/sclera: Conjunctivae normal.  Pulmonary:     Effort: Pulmonary effort is normal. No respiratory distress.  Abdominal:     General: Abdomen is flat.     Palpations: Abdomen is soft.     Tenderness: There is abdominal tenderness.  Skin:    General: Skin is warm and dry.  Neurological:     General: No focal deficit present.     Mental Status: She is alert and oriented to person, place, and time.  Psychiatric:        Mood and Affect: Mood normal.        Behavior: Behavior normal.     Imaging: No results found.  Labs:  CBC: Recent Labs    06/08/22 0828 06/11/22 1335 06/11/22 1352 06/12/22 0646 06/13/22 0753  WBC 11.0* 13.1*  --  10.9* 5.7  HGB 9.9* 11.4* 12.2 9.3* 9.2*  HCT 31.2* 34.9* 36.0 28.7* 28.2*  PLT 353 515*  --  436* 511*    COAGS: Recent Labs    06/02/22 0754 06/11/22 1335  INR 1.6* 1.2  APTT 37* 37*    BMP: Recent Labs    06/12/22 0646 06/13/22 0753 06/14/22 0605 06/15/22 0529  NA 137 137 137 139  K 2.9* 3.3* 2.9* 3.7  CL 106 105 103 108  CO2 _0 GLUCOSE 97 188* 217* 155*  BUN <5* <5* 7 13   CALCIUM 8.3* 8.4* 8.6* 8.3*  CREATININE 0.52 0.58 0.56 0.61  GFRNONAA >60 >60 >60 >60    LIVER FUNCTION TESTS: Recent Labs    06/02/22 0754 06/03/22 0512 06/04/22 0031 06/05/22 0401 06/06/22 0420 06/11/22 1335 06/13/22 0753  BILITOT 0.3  --  0.5  --   --  0.4 0.3  AST 6*  --  9*  --   --  11* 7*  ALT 10  --  11  --   --  14 7  ALKPHOS 43  --  58  --   --  59 50  PROT 4.4*  --  5.7*  --   --  7.1 6.3*  ALBUMIN 1.9*   < > 2.4* 2.4* 2.3* 3.0* 2.3*   < > = values in this interval not displayed.    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 Vernard Gambles attestation of this note for management and plan.

## 2022-07-03 ENCOUNTER — Other Ambulatory Visit (HOSPITAL_COMMUNITY): Payer: Self-pay

## 2022-07-06 ENCOUNTER — Other Ambulatory Visit: Payer: Medicaid Other

## 2022-07-09 ENCOUNTER — Encounter: Payer: Self-pay | Admitting: Registered"

## 2022-07-09 ENCOUNTER — Encounter: Payer: Self-pay | Admitting: Internal Medicine

## 2022-07-09 ENCOUNTER — Ambulatory Visit (INDEPENDENT_AMBULATORY_CARE_PROVIDER_SITE_OTHER): Payer: Medicaid Other | Admitting: Internal Medicine

## 2022-07-09 ENCOUNTER — Other Ambulatory Visit: Payer: Self-pay

## 2022-07-09 VITALS — BP 105/70 | HR 106 | Temp 98.5°F | Ht 66.0 in | Wt 123.0 lb

## 2022-07-09 DIAGNOSIS — K651 Peritoneal abscess: Secondary | ICD-10-CM | POA: Diagnosis present

## 2022-07-09 DIAGNOSIS — K5090 Crohn's disease, unspecified, without complications: Secondary | ICD-10-CM

## 2022-07-09 MED ORDER — DICYCLOMINE HCL 10 MG PO CAPS
10.0000 mg | ORAL_CAPSULE | Freq: Three times a day (TID) | ORAL | 1 refills | Status: DC
Start: 2022-07-09 — End: 2022-07-20

## 2022-07-09 NOTE — Progress Notes (Signed)
ID: follow up hospitalization for IAA, hx of crohn's  Patient ID: Sandra Foster, female   DOB: 11/20/1990, 31 y.o.   MRN: [id number]  HPI: Sandra Foster is a 31yo F with chron's disease, being treated for intra-abdominal abscess. She still has drain in from 8/31 there they changed to a bag. Finished oral abtx. She reports having chronic abdominal pain and pains med refill done by PCP. Dr Loreta Ave her GI doctor, supposed to see her soon as medicaid card is issued  Outpatient Encounter Medications as of 07/09/2022  Medication Sig   acetaminophen (TYLENOL) 500 MG tablet Take 1 tablet (500 mg total) by mouth every 6 (six) hours as needed.   blood glucose meter kit and supplies Dispense based on patient and insurance preference. Use up to four times daily as directed. (FOR ICD-10 E10.9, E11.9).   carboxymethylcellulose (REFRESH PLUS) 0.5 % SOLN Place 1 drop into both eyes daily as needed (dry eyes).   etonogestrel (NEXPLANON) 68 MG IMPL implant 1 each by Subdermal route once.   insulin glargine (LANTUS) 100 UNIT/ML injection Inject 0.09 mLs (9 Units total) into the skin daily.   Insulin Pen Needle (PEN NEEDLES) 30G X 5 MM MISC 1 Pen by Does not apply route 4 (four) times daily -  before meals and at bedtime.   oxyCODONE (OXY IR/ROXICODONE) 5 MG immediate release tablet Take 1-2 tablets (5-10 mg total) by mouth every 6 (six) hours as needed for severe pain or moderate pain.   polyethylene glycol (MIRALAX / GLYCOLAX) 17 g packet Take 17 g by mouth daily.   predniSONE (DELTASONE) 20 MG tablet Take 2 tablets (40 mg total) by mouth daily with breakfast.   sodium chloride flush (NS) 0.9 % SOLN 5 mLs by Intracatheter route daily.   sodium chloride flush (NS) 0.9 % SOLN Inject 3-76mLs into drain daily or every other day   sulfamethoxazole-trimethoprim (BACTRIM DS) 800-160 MG tablet Take 1 tablet by mouth 3 (three) times a week.   pantoprazole (PROTONIX) 40 MG tablet Take 1 tablet (40 mg total) by mouth daily.    [DISCONTINUED] amitriptyline (ELAVIL) 25 MG tablet Take 1-2 tablets (25-50 mg total) by mouth at bedtime as needed (headache).   No facility-administered encounter medications on file as of 07/09/2022.     Patient Active Problem List   Diagnosis Date Noted   Intra-abdominal abscess (HCC) 06/22/2022   Need for pneumocystis prophylaxis 06/22/2022   Malnutrition of moderate degree 06/13/2022   Right leg pain 06/12/2022   SBO (small bowel obstruction) (HCC) 06/12/2022   Hydroureteronephrosis 06/12/2022   Thrombocytosis 06/12/2022   Sepsis (HCC) 06/01/2022   Abnormal weight loss 05/28/2022   Gastroesophageal reflux disease 05/28/2022   Acute Crohn's disease (HCC) 05/03/2022   Iron deficiency anemia 04/24/2022   Normocytic anemia 04/23/2022   Hypotension 04/23/2022   Uncontrolled NIDDM-2 with hyperglycemia 04/23/2022   Crohn's disease of ileum, with abscess (HCC) 04/22/2022   Tobacco abuse 04/22/2022   Dehydration 01/09/2022   Fistula of intestine, 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   Abdominal pain, acute, right lower quadrant 01/08/2022   Hypokalemia 01/08/2022   Diarrhea 01/08/2022   NVD (normal vaginal delivery) 09/13/2015   Diabetes in undelivered pregnancy 08/30/2015   Insulin-requiring or dependent type II diabetes mellitus (HCC) 08/29/2015   [redacted] weeks gestation of pregnancy    Prior pregnancy with fetal demise and current pregnancy in second trimester    Encounter for fetal anatomic survey      Health Maintenance Due  Topic  Date Due   COVID-19 Vaccine (1) Never done   FOOT EXAM  Never done   OPHTHALMOLOGY EXAM  Never done   URINE MICROALBUMIN  Never done   Hepatitis C Screening  Never done   PAP SMEAR-Modifier  03/29/2018   INFLUENZA VACCINE  Never done     Review of Systems 12 point ros is negative except for ongoing drainage and associated abdominal pain. No blood in stool Physical Exam   BP 105/70   Pulse (!) 106   Temp 98.5 F (36.9 C) (Oral)   Ht $R'5\' 6"'vL$   (1.676 m)   Wt 123 lb (55.8 kg)   BMI 19.85 kg/m    Physical Exam  Constitutional:  oriented to person, place, and time. appears well-developed and well-nourished. No distress.  HENT: [place]/AT, PERRLA, no scleral icterus Mouth/Throat: Oropharynx is clear and moist. No oropharyngeal exudate.  Cardiovascular: Normal rate, regular rhythm and normal heart sounds. Exam reveals no gallop and no friction rub.  No murmur heard.  Pulmonary/Chest: Effort normal and breath sounds normal. No respiratory distress.  has no wheezes.  Neck = supple, no nuchal rigidity Abdominal: Soft. Bowel sounds are normal.  exhibits no distension. There is no tenderness. LLQ drain in place Lymphadenopathy: no cervical adenopathy. No axillary adenopathy Neurological: alert and oriented to person, place, and time.  Skin: Skin is warm and dry. No rash noted. No erythema.  Psychiatric: a normal mood and affect.  behavior is normal.   Lab Results  Component Value Date   LABRPR NON REACTIVE 06/18/2020    CBC Lab Results  Component Value Date   WBC 5.7 06/13/2022   RBC 3.36 (L) 06/13/2022   HGB 9.2 (L) 06/13/2022   HCT 28.2 (L) 06/13/2022   PLT 511 (H) 06/13/2022   MCV 83.9 06/13/2022   MCH 27.4 06/13/2022   MCHC 32.6 06/13/2022   RDW 15.6 (H) 06/13/2022   LYMPHSABS 1.2 06/11/2022   MONOABS 0.9 06/11/2022   EOSABS 0.1 06/11/2022    BMET Lab Results  Component Value Date   NA 139 06/15/2022   K 3.7 06/15/2022   CL 108 06/15/2022   CO2 27 06/15/2022   GLUCOSE 155 (H) 06/15/2022   BUN 13 06/15/2022   CREATININE 0.61 06/15/2022   CALCIUM 8.3 (L) 06/15/2022   GFRNONAA >60 06/15/2022   GFRAA >60 08/28/2015      Assessment and Plan  Continue augmentin bid for 30 days (refill given by PCP); roughly still 15-20 mL drainage per day.  Continue on your bactrim DS M-W-F since pred is $Remov'20mg'VCiOnQ$  daily Abdominal pain - was given through hospitalist, her gi doc or pcp doesn't prescribe. Will do trial bentyl to see  if any improvement.

## 2022-07-11 ENCOUNTER — Inpatient Hospital Stay (HOSPITAL_COMMUNITY)
Admission: EM | Admit: 2022-07-11 | Discharge: 2022-07-20 | DRG: 329 | Disposition: A | Discharge status: Home / Self Care | Payer: Medicaid Other | Attending: Internal Medicine | Admitting: Internal Medicine

## 2022-07-11 ENCOUNTER — Emergency Department (HOSPITAL_COMMUNITY): Payer: Medicaid Other

## 2022-07-11 ENCOUNTER — Other Ambulatory Visit: Payer: Self-pay

## 2022-07-11 ENCOUNTER — Encounter (HOSPITAL_COMMUNITY): Payer: Self-pay

## 2022-07-11 DIAGNOSIS — Z79899 Other long term (current) drug therapy: Secondary | ICD-10-CM

## 2022-07-11 DIAGNOSIS — E8809 Other disorders of plasma-protein metabolism, not elsewhere classified: Secondary | ICD-10-CM | POA: Diagnosis present

## 2022-07-11 DIAGNOSIS — R634 Abnormal weight loss: Secondary | ICD-10-CM | POA: Diagnosis present

## 2022-07-11 DIAGNOSIS — R140 Abdominal distension (gaseous): Secondary | ICD-10-CM | POA: Diagnosis present

## 2022-07-11 DIAGNOSIS — T380X5A Adverse effect of glucocorticoids and synthetic analogues, initial encounter: Secondary | ICD-10-CM | POA: Diagnosis present

## 2022-07-11 DIAGNOSIS — E876 Hypokalemia: Secondary | ICD-10-CM | POA: Diagnosis present

## 2022-07-11 DIAGNOSIS — Z298 Encounter for other specified prophylactic measures: Secondary | ICD-10-CM | POA: Diagnosis not present

## 2022-07-11 DIAGNOSIS — K631 Perforation of intestine (nontraumatic): Secondary | ICD-10-CM | POA: Diagnosis present

## 2022-07-11 DIAGNOSIS — Z794 Long term (current) use of insulin: Secondary | ICD-10-CM

## 2022-07-11 DIAGNOSIS — Z886 Allergy status to analgesic agent status: Secondary | ICD-10-CM | POA: Diagnosis not present

## 2022-07-11 DIAGNOSIS — Z681 Body mass index (BMI) 19 or less, adult: Secondary | ICD-10-CM | POA: Diagnosis not present

## 2022-07-11 DIAGNOSIS — Z9689 Presence of other specified functional implants: Secondary | ICD-10-CM | POA: Diagnosis present

## 2022-07-11 DIAGNOSIS — K50014 Crohn's disease of small intestine with abscess: Principal | ICD-10-CM | POA: Diagnosis present

## 2022-07-11 DIAGNOSIS — Z20822 Contact with and (suspected) exposure to covid-19: Secondary | ICD-10-CM | POA: Diagnosis present

## 2022-07-11 DIAGNOSIS — E1165 Type 2 diabetes mellitus with hyperglycemia: Secondary | ICD-10-CM | POA: Diagnosis present

## 2022-07-11 DIAGNOSIS — R300 Dysuria: Secondary | ICD-10-CM | POA: Diagnosis not present

## 2022-07-11 DIAGNOSIS — N739 Female pelvic inflammatory disease, unspecified: Secondary | ICD-10-CM | POA: Diagnosis present

## 2022-07-11 DIAGNOSIS — R059 Cough, unspecified: Secondary | ICD-10-CM | POA: Diagnosis present

## 2022-07-11 DIAGNOSIS — I10 Essential (primary) hypertension: Secondary | ICD-10-CM | POA: Diagnosis not present

## 2022-07-11 DIAGNOSIS — K651 Peritoneal abscess: Secondary | ICD-10-CM | POA: Diagnosis present

## 2022-07-11 DIAGNOSIS — Z932 Ileostomy status: Secondary | ICD-10-CM

## 2022-07-11 DIAGNOSIS — Z8249 Family history of ischemic heart disease and other diseases of the circulatory system: Secondary | ICD-10-CM | POA: Diagnosis not present

## 2022-07-11 DIAGNOSIS — N1330 Unspecified hydronephrosis: Secondary | ICD-10-CM | POA: Diagnosis present

## 2022-07-11 DIAGNOSIS — K50914 Crohn's disease, unspecified, with abscess: Secondary | ICD-10-CM

## 2022-07-11 DIAGNOSIS — N135 Crossing vessel and stricture of ureter without hydronephrosis: Secondary | ICD-10-CM | POA: Diagnosis present

## 2022-07-11 DIAGNOSIS — E119 Type 2 diabetes mellitus without complications: Secondary | ICD-10-CM | POA: Diagnosis not present

## 2022-07-11 DIAGNOSIS — K5651 Intestinal adhesions [bands], with partial obstruction: Secondary | ICD-10-CM | POA: Diagnosis present

## 2022-07-11 DIAGNOSIS — K50918 Crohn's disease, unspecified, with other complication: Secondary | ICD-10-CM | POA: Diagnosis not present

## 2022-07-11 DIAGNOSIS — Z87891 Personal history of nicotine dependence: Secondary | ICD-10-CM

## 2022-07-11 DIAGNOSIS — D62 Acute posthemorrhagic anemia: Secondary | ICD-10-CM | POA: Diagnosis not present

## 2022-07-11 DIAGNOSIS — R188 Other ascites: Secondary | ICD-10-CM | POA: Diagnosis present

## 2022-07-11 LAB — CBC WITH DIFFERENTIAL/PLATELET
Abs Immature Granulocytes: 0.02 10*3/uL (ref 0.00–0.07)
Basophils Absolute: 0 10*3/uL (ref 0.0–0.1)
Basophils Relative: 0 %
Eosinophils Absolute: 0.2 10*3/uL (ref 0.0–0.5)
Eosinophils Relative: 2 %
HCT: 30.5 % — ABNORMAL LOW (ref 36.0–46.0)
Hemoglobin: 9.9 g/dL — ABNORMAL LOW (ref 12.0–15.0)
Immature Granulocytes: 0 %
Lymphocytes Relative: 16 %
Lymphs Abs: 1.5 10*3/uL (ref 0.7–4.0)
MCH: 27.1 pg (ref 26.0–34.0)
MCHC: 32.5 g/dL (ref 30.0–36.0)
MCV: 83.6 fL (ref 80.0–100.0)
Monocytes Absolute: 1.1 10*3/uL — ABNORMAL HIGH (ref 0.1–1.0)
Monocytes Relative: 11 %
Neutro Abs: 6.7 10*3/uL (ref 1.7–7.7)
Neutrophils Relative %: 71 %
Platelets: 307 10*3/uL (ref 150–400)
RBC: 3.65 MIL/uL — ABNORMAL LOW (ref 3.87–5.11)
RDW: 17.5 % — ABNORMAL HIGH (ref 11.5–15.5)
WBC: 9.6 10*3/uL (ref 4.0–10.5)
nRBC: 0 % (ref 0.0–0.2)

## 2022-07-11 LAB — COMPREHENSIVE METABOLIC PANEL
ALT: 9 U/L (ref 0–44)
AST: 9 U/L — ABNORMAL LOW (ref 15–41)
Albumin: 2.8 g/dL — ABNORMAL LOW (ref 3.5–5.0)
Alkaline Phosphatase: 58 U/L (ref 38–126)
Anion gap: 9 (ref 5–15)
BUN: 9 mg/dL (ref 6–20)
CO2: 23 mmol/L (ref 22–32)
Calcium: 8.2 mg/dL — ABNORMAL LOW (ref 8.9–10.3)
Chloride: 101 mmol/L (ref 98–111)
Creatinine, Ser: 0.51 mg/dL (ref 0.44–1.00)
GFR, Estimated: >60 mL/min (ref >60)
Glucose, Bld: 128 mg/dL — ABNORMAL HIGH (ref 70–99)
Potassium: 2.9 mmol/L — ABNORMAL LOW (ref 3.5–5.1)
Sodium: 133 mmol/L — ABNORMAL LOW (ref 135–145)
Total Bilirubin: 0.4 mg/dL (ref 0.3–1.2)
Total Protein: 6.2 g/dL — ABNORMAL LOW (ref 6.5–8.1)

## 2022-07-11 LAB — URINALYSIS, ROUTINE W REFLEX MICROSCOPIC
Bacteria, UA: NONE SEEN
Bilirubin Urine: NEGATIVE
Glucose, UA: NEGATIVE mg/dL
Hgb urine dipstick: NEGATIVE
Ketones, ur: NEGATIVE mg/dL
Nitrite: NEGATIVE
Protein, ur: NEGATIVE mg/dL
Specific Gravity, Urine: 1.02 (ref 1.005–1.030)
pH: 6 (ref 5.0–8.0)

## 2022-07-11 LAB — I-STAT BETA HCG BLOOD, ED (MC, WL, AP ONLY): I-stat hCG, quantitative: <5 m[IU]/mL (ref <5)

## 2022-07-11 LAB — RESP PANEL BY RT-PCR (FLU A&B, COVID) ARPGX2
Influenza A by PCR: NEGATIVE
Influenza B by PCR: NEGATIVE
SARS Coronavirus 2 by RT PCR: NEGATIVE

## 2022-07-11 LAB — LACTIC ACID, PLASMA: Lactic Acid, Venous: 1 mmol/L (ref 0.5–1.9)

## 2022-07-11 LAB — PROTIME-INR
INR: 1.2 (ref 0.8–1.2)
Prothrombin Time: 15.1 seconds (ref 11.4–15.2)

## 2022-07-11 LAB — MAGNESIUM: Magnesium: 1.8 mg/dL (ref 1.7–2.4)

## 2022-07-11 LAB — APTT: aPTT: 38 seconds — ABNORMAL HIGH (ref 24–36)

## 2022-07-11 MED ORDER — PANTOPRAZOLE SODIUM 40 MG PO TBEC
40.0000 mg | DELAYED_RELEASE_TABLET | Freq: Every day | ORAL | Status: DC
  Administered 2022-07-12: 40 mg via ORAL
  Filled 2022-07-11: qty 1

## 2022-07-11 MED ORDER — MORPHINE SULFATE (PF) 2 MG/ML IV SOLN: 2.0000 mg | INTRAVENOUS | Status: DC | PRN

## 2022-07-11 MED ORDER — INSULIN GLARGINE-YFGN 100 UNIT/ML ~~LOC~~ SOLN
11.0000 [IU] | Freq: Every day | SUBCUTANEOUS | Status: DC
  Administered 2022-07-12: 11 [IU] via SUBCUTANEOUS
  Filled 2022-07-11 (×2): qty 0.11

## 2022-07-11 MED ORDER — SODIUM CHLORIDE 0.9 % IV BOLUS
1000.0000 mL | Freq: Once | INTRAVENOUS | Status: AC
  Administered 2022-07-11: 1000 mL via INTRAVENOUS

## 2022-07-11 MED ORDER — ONDANSETRON HCL 4 MG/2ML IJ SOLN: 4.0000 mg | Freq: Four times a day (QID) | INTRAMUSCULAR | Status: DC | PRN

## 2022-07-11 MED ORDER — LACTATED RINGERS IV SOLN: INTRAVENOUS | Status: DC

## 2022-07-11 MED ORDER — ONDANSETRON HCL 4 MG/2ML IJ SOLN
4.0000 mg | Freq: Once | INTRAMUSCULAR | Status: AC
  Administered 2022-07-11: 4 mg via INTRAVENOUS
  Filled 2022-07-11: qty 2

## 2022-07-11 MED ORDER — CARBOXYMETHYLCELLULOSE SODIUM 0.5 % OP SOLN: 1.0000 [drp] | Freq: Every day | OPHTHALMIC | Status: DC | PRN

## 2022-07-11 MED ORDER — ACETAMINOPHEN 325 MG PO TABS
650.0000 mg | ORAL_TABLET | Freq: Four times a day (QID) | ORAL | Status: DC | PRN
  Administered 2022-07-12: 650 mg via ORAL
  Filled 2022-07-11: qty 2

## 2022-07-11 MED ORDER — SULFAMETHOXAZOLE-TRIMETHOPRIM 800-160 MG PO TABS
1.0000 | ORAL_TABLET | ORAL | Status: DC
  Filled 2022-07-11: qty 1

## 2022-07-11 MED ORDER — IOHEXOL 300 MG/ML  SOLN
100.0000 mL | Freq: Once | INTRAMUSCULAR | Status: AC | PRN
  Administered 2022-07-11: 100 mL via INTRAVENOUS

## 2022-07-11 MED ORDER — POTASSIUM CHLORIDE 10 MEQ/100ML IV SOLN
10.0000 meq | INTRAVENOUS | Status: AC
  Administered 2022-07-11 (×3): 10 meq via INTRAVENOUS
  Filled 2022-07-11 (×3): qty 100

## 2022-07-11 MED ORDER — ONDANSETRON HCL 4 MG PO TABS: 4.0000 mg | ORAL_TABLET | Freq: Four times a day (QID) | ORAL | Status: DC | PRN

## 2022-07-11 MED ORDER — HYDROMORPHONE HCL 1 MG/ML IJ SOLN
0.5000 mg | INTRAMUSCULAR | Status: DC | PRN
  Administered 2022-07-11 – 2022-07-15 (×20): 1 mg via INTRAVENOUS
  Filled 2022-07-11 (×20): qty 1

## 2022-07-11 MED ORDER — PIPERACILLIN-TAZOBACTAM 3.375 G IVPB 30 MIN
3.3750 g | Freq: Once | INTRAVENOUS | Status: AC
  Administered 2022-07-11: 3.375 g via INTRAVENOUS
  Filled 2022-07-11: qty 50

## 2022-07-11 MED ORDER — PIPERACILLIN-TAZOBACTAM 3.375 G IVPB
3.3750 g | Freq: Three times a day (TID) | INTRAVENOUS | Status: DC
  Administered 2022-07-12 – 2022-07-17 (×16): 3.375 g via INTRAVENOUS
  Filled 2022-07-11 (×15): qty 50

## 2022-07-11 MED ORDER — INSULIN ASPART 100 UNIT/ML IJ SOLN
0.0000 [IU] | INTRAMUSCULAR | Status: DC
  Administered 2022-07-13: 2 [IU] via SUBCUTANEOUS
  Administered 2022-07-13 – 2022-07-16 (×7): 1 [IU] via SUBCUTANEOUS
  Administered 2022-07-16: 2 [IU] via SUBCUTANEOUS
  Administered 2022-07-17 – 2022-07-18 (×5): 1 [IU] via SUBCUTANEOUS
  Filled 2022-07-11: qty 0.09

## 2022-07-11 MED ORDER — POLYVINYL ALCOHOL 1.4 % OP SOLN: 1.0000 [drp] | Freq: Every day | OPHTHALMIC | Status: DC | PRN

## 2022-07-11 MED ORDER — ACETAMINOPHEN 325 MG PO TABS
650.0000 mg | ORAL_TABLET | Freq: Once | ORAL | Status: AC
  Administered 2022-07-11: 650 mg via ORAL
  Filled 2022-07-11: qty 2

## 2022-07-11 MED ORDER — MORPHINE SULFATE (PF) 4 MG/ML IV SOLN
4.0000 mg | Freq: Once | INTRAVENOUS | Status: AC
  Administered 2022-07-11: 4 mg via INTRAVENOUS
  Filled 2022-07-11: qty 1

## 2022-07-11 MED ORDER — ACETAMINOPHEN 650 MG RE SUPP: 650.0000 mg | Freq: Four times a day (QID) | RECTAL | Status: DC | PRN

## 2022-07-11 NOTE — Assessment & Plan Note (Addendum)
Patient follows with ID. Currently on Bactrim. -Continue Bactrim DS three times weekly

## 2022-07-11 NOTE — Progress Notes (Signed)
Pharmacy Antibiotic Note  Sandra Foster is a 31 y.o. female admitted on 07/11/2022 with  IAI .  Pharmacy has been consulted for zosyn dosing.  Plan: Zosyn 3.375g IV q8h (4 hour infusion). Pharmacy to sign off  Height: 5\' 6"  (167.6 cm) Weight: 55.8 kg (123 lb) IBW/kg (Calculated) : 59.3  Temp (24hrs), Avg:101.5 F (38.6 C), Min:101.5 F (38.6 C), Max:101.5 F (38.6 C)  Recent Labs  Lab 07/11/22 1856  WBC 9.6  CREATININE 0.51  LATICACIDVEN 1.0    Estimated Creatinine Clearance: 90.6 mL/min (by C-G formula based on SCr of 0.51 mg/dL).    Allergies  Allergen Reactions   Nsaids Other (See Comments)    Crohn's disease = NO NSAIDs     Thank you for allowing pharmacy to be a part of this patient's care.  07/13/22, Pharm.D 07/11/2022 8:27 PM

## 2022-07-11 NOTE — Assessment & Plan Note (Addendum)
Potassium of 2.9 today after prior repletion. Normal magnesium -Continue potassium supplementation

## 2022-07-11 NOTE — ED Provider Notes (Signed)
[facility] DEPT Provider Note   CSN: [id number] Arrival date & time: 07/11/22  1837     History  Chief Complaint  Patient presents with   Abdominal Pain   Fever    Sandra Foster is a 31 y.o. female.  31yo female with headache, right side abdominal pain and reports abdomen feels hard x 3 days, treating at home with fluids and rest. JP drain in from intraabdominal abscess from crohns, on antibiotics, seen by ID in follow up 2 days and given Bentyl for abdominal pain. Reports minimal drainage from drain today. Sore throat yesterday, denies cough, congestion. Normal urine output. Constipation x 4 days, no relief with miralax x 2 days. Is passing gas. Last had Tylenol at 8am today.  Received Remicade on last hospital admission.        Home Medications Prior to Admission medications   Medication Sig Start Date End Date Taking? Authorizing Provider  acetaminophen (TYLENOL) 500 MG tablet Take 1 tablet (500 mg total) by mouth every 6 (six) hours as needed. Patient taking differently: Take 500 mg by mouth every 6 (six) hours as needed for mild pain. 05/22/22  Yes Varney Biles, MD  amoxicillin-clavulanate (AUGMENTIN) 875-125 MG tablet Take 1 tablet by mouth 2 (two) times daily.   Yes [provider]  carboxymethylcellulose (REFRESH PLUS) 0.5 % SOLN Place 1 drop into both eyes daily as needed (dry eyes).   Yes [provider]  dicyclomine (BENTYL) 10 MG capsule Take 1 capsule (10 mg total) by mouth 3 (three) times daily before meals. 07/09/22  Yes Carlyle Basques, MD  etonogestrel (NEXPLANON) 68 MG IMPL implant 1 each by Subdermal route once.   Yes [provider]  LANTUS SOLOSTAR 100 UNIT/ML Solostar Pen Inject 11 Units into the skin in the morning. 07/05/22  Yes [provider]  methocarbamol (ROBAXIN) 500 MG tablet Take 500 mg by mouth 2 (two) times daily. 07/06/22  Yes [provider]  pantoprazole (PROTONIX) 40  MG tablet Take 40 mg by mouth daily.   Yes [provider]  polyethylene glycol (MIRALAX / GLYCOLAX) 17 g packet Take 17 g by mouth daily. 06/09/22  Yes Regalado, Belkys A, MD  sodium chloride flush (NS) 0.9 % SOLN Inject 3-53mLs into drain daily or every other day 06/28/22 07/18/22 Yes Boisseau, Hayley, PA  sulfamethoxazole-trimethoprim (BACTRIM DS) 800-160 MG tablet Take 1 tablet by mouth 3 (three) times a week. 06/22/22 09/14/22 Yes Mignon Pine, DO  blood glucose meter kit and supplies Dispense based on patient and insurance preference. Use up to four times daily as directed. (FOR ICD-10 E10.9, E11.9). 04/26/22   Mercy Riding, MD  insulin glargine (LANTUS) 100 UNIT/ML injection Inject 0.09 mLs (9 Units total) into the skin daily. Patient not taking: Reported on 07/11/2022 06/16/22   Samuella Cota, MD  Insulin Pen Needle (PEN NEEDLES) 30G X 5 MM MISC 1 Pen by Does not apply route 4 (four) times daily -  before meals and at bedtime. 04/26/22   Mercy Riding, MD  oxyCODONE (OXY IR/ROXICODONE) 5 MG immediate release tablet Take 1-2 tablets (5-10 mg total) by mouth every 6 (six) hours as needed for severe pain or moderate pain. Patient not taking: Reported on 07/11/2022 06/16/22   Samuella Cota, MD  predniSONE (DELTASONE) 20 MG tablet Take 2 tablets (40 mg total) by mouth daily with breakfast. Patient not taking: Reported on 07/11/2022 06/17/22   Samuella Cota, MD  sodium chloride  flush (NS) 0.9 % SOLN 5 mLs by Intracatheter route daily. Patient not taking: Reported on 07/11/2022 06/08/22   Regalado, Jerald Kief A, MD  amitriptyline (ELAVIL) 25 MG tablet Take 1-2 tablets (25-50 mg total) by mouth at bedtime as needed (headache). 03/25/20 06/18/20  Gregor Hams, MD      Allergies    Nsaids    Review of Systems   Review of Systems Negative except as per HPI Physical Exam Updated Vital Signs BP 111/65   Pulse 95   Temp (!) 101.5 F (38.6 C) (Oral)   Resp 15   Ht $R'5\' 6"'Qd$  (1.676 m)    Wt 55.8 kg   SpO2 99%   BMI 19.85 kg/m  Physical Exam Vitals and nursing note reviewed.  Constitutional:      General: She is not in acute distress.    Appearance: She is well-developed. She is not diaphoretic.  HENT:     Head: Normocephalic and atraumatic.  Cardiovascular:     Rate and Rhythm: Regular rhythm. Tachycardia present.     Heart sounds: Normal heart sounds.  Pulmonary:     Effort: Pulmonary effort is normal.     Breath sounds: Normal breath sounds.  Abdominal:     General: Bowel sounds are normal.     Tenderness: There is generalized abdominal tenderness and tenderness in the right upper quadrant and right lower quadrant.     Comments: RLQ drain  Musculoskeletal:     Right lower leg: No edema.     Left lower leg: No edema.  Skin:    General: Skin is warm and dry.     Findings: No erythema or rash.  Neurological:     Mental Status: She is alert and oriented to person, place, and time.  Psychiatric:        Behavior: Behavior normal.     ED Results / Procedures / Treatments   Labs (all labs ordered are listed, but only abnormal results are displayed) Labs Reviewed  COMPREHENSIVE METABOLIC PANEL - Abnormal; Notable for the following components:      Result Value   Sodium 133 (*)    Potassium 2.9 (*)    Glucose, Bld 128 (*)    Calcium 8.2 (*)    Total Protein 6.2 (*)    Albumin 2.8 (*)    AST 9 (*)    All other components within normal limits  CBC WITH DIFFERENTIAL/PLATELET - Abnormal; Notable for the following components:   RBC 3.65 (*)    Hemoglobin 9.9 (*)    HCT 30.5 (*)    RDW 17.5 (*)    Monocytes Absolute 1.1 (*)    All other components within normal limits  URINALYSIS, ROUTINE W REFLEX MICROSCOPIC - Abnormal; Notable for the following components:   APPearance HAZY (*)    Leukocytes,Ua TRACE (*)    All other components within normal limits  APTT - Abnormal; Notable for the following components:   aPTT 38 (*)    All other components within  normal limits  RESP PANEL BY RT-PCR (FLU A&B, COVID) ARPGX2  CULTURE, BLOOD (ROUTINE X 2)  CULTURE, BLOOD (ROUTINE X 2)  URINE CULTURE  LACTIC ACID, PLASMA  PROTIME-INR  MAGNESIUM  CBC  BASIC METABOLIC PANEL  I-STAT BETA HCG BLOOD, ED ([facility], [facility], [facility] ONLY)    EKG EKG Interpretation  Date/Time:  Wednesday July 11 2022 19:34:13 EDT Ventricular Rate:  108 PR Interval:  110 QRS Duration: 80 QT Interval:  311 QTC Calculation: 417  R Axis:   57 Text Interpretation: Sinus tachycardia Borderline repolarization abnormality No significant change since last tracing Confirmed by Blanchie Dessert [phone]) on 07/11/2022 8:18:09 PM  Radiology DG Chest 2 View  Result Date: 07/11/2022 CLINICAL DATA:  Fever; suspected sepsis EXAM: CHEST - 2 VIEW COMPARISON:  Radiographs 06/11/2022 FINDINGS: No focal consolidation, pleural effusion, or pneumothorax. Normal cardiomediastinal silhouette. No acute osseous abnormality. IMPRESSION: No active cardiopulmonary disease. Electronically Signed   By: Placido Sou M.D.   On: 07/11/2022 20:04   CT Abdomen Pelvis W Contrast  Result Date: 07/11/2022 CLINICAL DATA:  Abdomen pain fever EXAM: CT ABDOMEN AND PELVIS WITH CONTRAST TECHNIQUE: Multidetector CT imaging of the abdomen and pelvis was performed using the standard protocol following bolus administration of intravenous contrast. RADIATION DOSE REDUCTION: This exam was performed according to the departmental dose-optimization program which includes automated exposure control, adjustment of the mA and/or kV according to patient size and/or use of iterative reconstruction technique. CONTRAST:  170mL OMNIPAQUE IOHEXOL 300 MG/ML  SOLN COMPARISON:  CT 06/28/2022, 06/11/2022, 05/03/2022 FINDINGS: Lower chest: Lung bases demonstrate no acute airspace disease. Hepatobiliary: No focal liver abnormality is seen. No gallstones, gallbladder wall thickening, or biliary dilatation. Pancreas: Unremarkable. No pancreatic ductal  dilatation or surrounding inflammatory changes. Spleen: Normal in size without focal abnormality. Adrenals/Urinary Tract: Adrenal glands are within normal limits. Prominent right extrarenal pelvis, mildly increased compared to prior. Moderate dilatation of left extrarenal pelvis and ureter, increased compared to most recent prior. Urinary bladder is unremarkable. Stomach/Bowel: The stomach is nondistended. Abnormal thickened small bowel loops with mucosal enhancement in the right lower quadrant worse as compared with 06/28/2022. wall thickening and inflammation at the cecum. Vascular/Lymphatic: Nonaneurysmal aorta. No increased adenopathy. Retroaortic left renal vein. Reproductive: Uterus and bilateral adnexa are unremarkable. Other: Right pelvic drainage catheter remains in place with small gas and fluid collection surrounding the pigtail. Interval irregular gas and fluid collection in the right lower quadrant measuring about 2.9 x 1.9 cm, series 2, image 50. Increased mildly loculated appearing pelvic effusions. No internal gas bubbles. Musculoskeletal: No acute or significant osseous findings. IMPRESSION: 1. Considerable inflammatory process in the right lower quadrant mostly involving distal small bowel, suspect for active inflammation/Crohn's. Inflammatory changes appear worse as compared with the CT from 06/28/2022. Right lower quadrant drainage catheter remains in place with small residual gas and fluid collection around the pigtail. Interim finding of small irregular gas and fluid collection in the right lower quadrant measuring up to 2.9 cm, this may reflect recurrent abscess or small area of contained perforation. Increased mildly loculated fluid within the pelvis. 2. Increased left hydronephrosis and hydroureter without obstructing stone. Slight increased dilatation of right extrarenal pelvis. Slightly thick-walled appearance of the urinary bladder, possible cystitis. Electronically Signed   By: Donavan Foil M.D.   On: 07/11/2022 20:03    Procedures .Critical Care  Performed by: Tacy Learn, PA-C Authorized by: Tacy Learn, PA-C   Critical care provider statement:    Critical care time (minutes):  30   Critical care was time spent personally by me on the following activities:  Development of treatment plan with patient or surrogate, discussions with consultants, evaluation of patient's response to treatment, examination of patient, ordering and review of laboratory studies, ordering and review of radiographic studies, ordering and performing treatments and interventions, pulse oximetry, re-evaluation of patient's condition and review of old charts     Medications Ordered in ED Medications  potassium chloride 10 mEq in 100 mL  IVPB (10 mEq Intravenous New Bag/Given 07/11/22 2105)  piperacillin-tazobactam (ZOSYN) IVPB 3.375 g (has no administration in time range)  lactated ringers infusion (has no administration in time range)  acetaminophen (TYLENOL) tablet 650 mg (has no administration in time range)    Or  acetaminophen (TYLENOL) suppository 650 mg (has no administration in time range)  ondansetron (ZOFRAN) tablet 4 mg (has no administration in time range)    Or  ondansetron (ZOFRAN) injection 4 mg (has no administration in time range)  acetaminophen (TYLENOL) tablet 650 mg (650 mg Oral Given 07/11/22 1922)  sodium chloride 0.9 % bolus 1,000 mL (1,000 mLs Intravenous New Bag/Given 07/11/22 1924)  morphine (PF) 4 MG/ML injection 4 mg (4 mg Intravenous Given 07/11/22 1925)  ondansetron (ZOFRAN) injection 4 mg (4 mg Intravenous Given 07/11/22 1922)  iohexol (OMNIPAQUE) 300 MG/ML solution 100 mL (100 mLs Intravenous Contrast Given 07/11/22 1937)  sodium chloride 0.9 % bolus 1,000 mL (1,000 mLs Intravenous New Bag/Given 07/11/22 2134)  piperacillin-tazobactam (ZOSYN) IVPB 3.375 g (3.375 g Intravenous New Bag/Given 07/11/22 2105)    ED Course/ Medical Decision Making/ A&P                            Medical Decision Making Amount and/or Complexity of Data Reviewed Labs: ordered. Radiology: ordered. ECG/medicine tests: ordered.  Risk OTC drugs. Prescription drug management.   This patient presents to the ED for concern of abdominal pain, fever, this involves an extensive number of treatment options, and is a complaint that carries with it a high risk of complications and morbidity.  The differential diagnosis includes but not limited to sepsis, worsening intra-abdominal infection, COVID/flu and, urinary tract infection, pneumonia   Co morbidities that complicate the patient evaluation  Crohn's disease, recent admission for sepsis secondary to Crohn's disease with perforation, SBO   Additional history obtained:  External records from outside source obtained and reviewed including visit to infectious disease dated 07/09/2022 for recheck, continued on Augmentin   Lab Tests:  I Ordered, and personally interpreted labs.  The pertinent results include: CBC with normal white count, baseline anemia, hemoglobin 9.9.  CMP with potassium of 2.9.  Lactic acid normal, INR normal, COVID flu negative.  Magnesium normal urinalysis without evidence of urinary tract infection.  hCG negative.   Imaging Studies ordered:  I ordered imaging studies including CT abdomen pelvis with contrast I independently visualized and interpreted imaging which showed worsening infection on the right abdomen compared to prior on file from 06/28/2022 I agree with the radiologist interpretation: Showing considerable inflammatory process in the right lower quadrant mostly involving the distal small bowel, suspect for acute inflammation/Crohn's.  Inflammatory changes appear worse as compared to CT from 06/28/2022.  Right lower quadrant drainage catheter remains in place with small residual gas and fluid collection around the pigtail.  Interim finding of small irregular gas and fluid collection right lower  quadrant measuring up to 2.9 cm may reflect recurrent abscess or small area of contained perforation.   Cardiac Monitoring: / EKG:  The patient was maintained on a cardiac monitor.  I personally viewed and interpreted the cardiac monitored which showed an underlying rhythm of: Sinus tachycardia, rate 108   Consultations Obtained:  I requested consultation with the general surgery, Dr. Marlou Starks,  and discussed lab and imaging findings as well as pertinent plan - they recommend: medicine to admit, GI consult recommended, surgery will see patient in the morning. Case discussed with Dr.  Collene Mares, patient's GI doctor who is familiar with patient, reports Dr. Almyra Free will round on patient in the morning, agrees with hospitalist admission. Discussed with Dr. Fabio Neighbors with [facility] who will consult for admission.    Problem List / ED Course / Critical interventions / Medication management  30 year old female with past medical history of Crohn's disease diagnosed earlier this year, complicated by admission at the beginning of August from the fourth to the 11th for sepsis secondary to a Crohn's flare with a perforation.  Patient ultimately had 2 drains placed on August 5 and was discharged home on Augmentin.  Patient returned to the emergency room for admission August 14 through 19 for SBO.  Patient has been unable to follow-up with her GI due to lack of physical Medicaid card however has been able to follow-up with ED, last seen in clinic 2 days ago and advised to continue her Augmentin, started on Bentyl for her abdominal pain.  States that she has had progressively worsening right-sided abdominal pain, the right side of her abdomen feels firm and now with a headache since yesterday, arrives with temperature of 101.5.  Patient is tachycardic, tachypneic, prompting septic work-up.  Her lactic acid was reassuring, her white count is normal.  Patient was given Tylenol for her fever.  Found to be  hypokalemic and given IV potassium, IV antibiotics after CT confirms worsening intra-abdominal infection compared to her 06/28/2022 CT, concern for small area of air.  Case was discussed with general surgery, team to round on patient tomorrow.  Discussed with GI who notes GI team will round on patient tomorrow.  Plan for hospitalist to admit. Vitals monitored and stable.  Improvement in her tachycardia. I ordered medication including morphine, Zofran, Tylenol, IV fluids, Zosyn, potassium for pain, fever, intra-abdominal infection, hypokalemia Reevaluation of the patient after these medicines showed that the patient stayed the same I have reviewed the patients home medicines and have made adjustments as needed   Social Determinants of Health:  Lives with family, recently lost her Medicaid coverage while in the hospital however has been told her Medicaid is active again and is awaiting her card in the mail.   Test / Admission - Considered:  Admit for further treatment         Final Clinical Impression(s) / ED Diagnoses Final diagnoses:  Intra-abdominal abscess ([place])  Hypokalemia  Crohn's disease with abscess, unspecified gastrointestinal tract location [facility])    Rx / DC Orders ED Discharge Orders     None         Roque Lias 07/11/22 2158    Blanchie Dessert, MD 07/19/22 [phone]

## 2022-07-11 NOTE — Assessment & Plan Note (Addendum)
Diabetes is uncontrolled with hyperglycemia. Most recent hemoglobin A1C of 9.6% from 8/23. Patient is on Lantus 11 units as an outpatient. -Continue Lantus

## 2022-07-11 NOTE — ED Triage Notes (Signed)
Pt bib ems from home for abd pain, fevers and constipation.  Pt has J-tube. Pt started having fevers last night. Pt tachy in triage. Pt given NS enroute w/ ems.

## 2022-07-11 NOTE — Assessment & Plan Note (Addendum)
Secondary to adjacent inflammation. Slightly worsened left sided disease. No obstructing stone identified. Renal function is stable.

## 2022-07-11 NOTE — ED Notes (Signed)
Waiting for Labs

## 2022-07-11 NOTE — Assessment & Plan Note (Addendum)
Worsening abscess and associated hydronephrosis. Significant pain. Contained perforation noted on imaging. General surgery consulted and are planning for surgical management on 9/15. -Continue Zosyn IV and analgesics -General surgery recommendations

## 2022-07-11 NOTE — H&P (Signed)
History and Physical    Patient: Sandra Foster DXI:[id number] DOB: October 23, 1991 DOA: 07/11/2022 DOS: the patient was seen and examined on 07/11/2022 PCP: Jorge Ny, PA-C  Patient coming from: Home  Chief Complaint:  Chief Complaint  Patient presents with   Abdominal Pain   Fever   HPI: Sandra Foster is a 31 y.o. female with medical history significant of IDDM, Crohn's disease.  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  This has progressively worsened and she has required 4 prior admits (5 including today) since June 2023.  She has developed abscess as well as inflammation causing extrensic compression of ureters and B hydronephrosis.  Attempt has been made to treat non-operatively with ABx during prior admits, as well as steroids + Remicaid started during admit 1 month ago.  Today will be 5th admit since June, continues to worsen / progress despite initially steroids, and more recently ABx + IR drainage last month followed by Remicaid and prednisone during admit 8/14-19.  Off of prednisone at this point.  Was supposed to continue Remicaid but only had the 1 dose in hospital 1 month ago since unable to be seen in GI office on 9/6 due to medicaid being cut off (has since been reinstated).  Has been on continuous ABx since that time, just saw Dr. Baxter Flattery in [place] [facility] x2 days ago).  Presents to ED today with worsening abd pain and fever.   Review of Systems: As mentioned in the history of present illness. All other systems reviewed and are negative. Past Medical History:  Diagnosis Date   Chlamydia    Crohn's disease ([place])    Diabetes mellitus without complication ([place])    Fetal demise    hyperglycemic hyperosmolar nonketonic syndrome   Hernia    Pregnancy induced hypertension    Pyelonephritis    Past Surgical History:  Procedure Laterality Date   BIOPSY  01/09/2022   Procedure: BIOPSY;  Surgeon: Carol Emmelia, MD;   Location: [facility] ENDOSCOPY;  Service: Gastroenterology;;   COLONOSCOPY WITH PROPOFOL N/A 01/09/2022   Procedure: COLONOSCOPY WITH PROPOFOL;  Surgeon: Carol Mada, MD;  Location: [place];  Service: Gastroenterology;  Laterality: N/A;   HERNIA REPAIR     IR RADIOLOGIST EVAL & MGMT  06/28/2022   Social History:  reports that she has quit smoking. Her smoking use included cigarettes. She smoked an average of .25 packs per day. She quit smokeless tobacco use about 11 years ago. She reports that she does not drink alcohol and does not use drugs.  Allergies  Allergen Reactions   Nsaids Other (See Comments)    Crohn's disease = NO NSAIDs    Family History  Problem Relation Age of Onset   Hypertension Maternal Grandmother     Prior to Admission medications   Medication Sig Start Date End Date Taking? Authorizing Provider  acetaminophen (TYLENOL) 500 MG tablet Take 1 tablet (500 mg total) by mouth every 6 (six) hours as needed. Patient taking differently: Take 500 mg by mouth every 6 (six) hours as needed for mild pain. 05/22/22  Yes Varney Biles, MD  amoxicillin-clavulanate (AUGMENTIN) 875-125 MG tablet Take 1 tablet by mouth 2 (two) times daily.   Yes [provider]  carboxymethylcellulose (REFRESH PLUS) 0.5 % SOLN Place 1 drop into both eyes daily as needed (dry eyes).   Yes [provider]  dicyclomine (BENTYL) 10 MG capsule Take 1 capsule (10 mg total) by mouth 3 (three) times  daily before meals. 07/09/22  Yes Carlyle Basques, MD  etonogestrel (NEXPLANON) 68 MG IMPL implant 1 each by Subdermal route once.   Yes [provider]  LANTUS SOLOSTAR 100 UNIT/ML Solostar Pen Inject 11 Units into the skin in the morning. 07/05/22  Yes [provider]  methocarbamol (ROBAXIN) 500 MG tablet Take 500 mg by mouth 2 (two) times daily. 07/06/22  Yes [provider]  pantoprazole (PROTONIX) 40 MG tablet Take 40 mg by mouth daily.   Yes [provider]   polyethylene glycol (MIRALAX / GLYCOLAX) 17 g packet Take 17 g by mouth daily. 06/09/22  Yes Regalado, Belkys A, MD  sulfamethoxazole-trimethoprim (BACTRIM DS) 800-160 MG tablet Take 1 tablet by mouth 3 (three) times a week. 06/22/22 09/14/22 Yes Mignon Pine, DO  blood glucose meter kit and supplies Dispense based on patient and insurance preference. Use up to four times daily as directed. (FOR ICD-10 E10.9, E11.9). 04/26/22   Mercy Riding, MD  Insulin Pen Needle (PEN NEEDLES) 30G X 5 MM MISC 1 Pen by Does not apply route 4 (four) times daily -  before meals and at bedtime. 04/26/22   Mercy Riding, MD  amitriptyline (ELAVIL) 25 MG tablet Take 1-2 tablets (25-50 mg total) by mouth at bedtime as needed (headache). 03/25/20 06/18/20  Gregor Hams, MD    Physical Exam: Vitals:   07/11/22 2030 07/11/22 2100 07/11/22 2130 07/11/22 2200  BP: 103/67 (!) 109/54 111/65 (!) 92/44  Pulse: 100 98 95 97  Resp: _0 Temp:    98.6 F (37 C)  TempSrc:    Oral  SpO2: 98% 100% 99% 99%  Weight:      Height:       Constitutional: NAD, calm, comfortable Eyes: PERRL, lids and conjunctivae normal ENMT: Mucous membranes are moist. Posterior pharynx clear of any exudate or lesions.Normal dentition.  Neck: normal, supple, no masses, no thyromegaly Respiratory: clear to auscultation bilaterally, no wheezing, no crackles. Normal respiratory effort. No accessory muscle use.  Cardiovascular: Regular rate and rhythm, no murmurs / rubs / gallops. No extremity edema. 2+ pedal pulses. No carotid bruits.  Abdomen: TTP generalized, RLQ drain Musculoskeletal: no clubbing / cyanosis. No joint deformity upper and lower extremities. Good ROM, no contractures. Normal muscle tone.  Skin: no rashes, lesions, ulcers. No induration Neurologic: CN 2-12 grossly intact. Sensation intact, DTR normal. Strength 5/5 in all 4.  Psychiatric: Normal judgment and insight. Alert and oriented x 3. Normal mood.   Data  Reviewed:    CBC    Component Value Date/Time   WBC 9.6 07/11/2022 1856   RBC 3.65 (L) 07/11/2022 1856   HGB 9.9 (L) 07/11/2022 1856   HCT 30.5 (L) 07/11/2022 1856   PLT 307 07/11/2022 1856   MCV 83.6 07/11/2022 1856   MCH 27.1 07/11/2022 1856   MCHC 32.5 07/11/2022 1856   RDW 17.5 (H) 07/11/2022 1856   LYMPHSABS 1.5 07/11/2022 1856   MONOABS 1.1 (H) 07/11/2022 1856   EOSABS 0.2 07/11/2022 1856   BASOSABS 0.0 07/11/2022 1856   CMP     Component Value Date/Time   NA 133 (L) 07/11/2022 1856   K 2.9 (L) 07/11/2022 1856   CL 101 07/11/2022 1856   CO2 23 07/11/2022 1856   GLUCOSE 128 (H) 07/11/2022 1856   BUN 9 07/11/2022 1856   CREATININE 0.51 07/11/2022 1856   CALCIUM 8.2 (L) 07/11/2022 1856   PROT 6.2 (L) 07/11/2022 1856  ALBUMIN 2.8 (L) 07/11/2022 1856   AST 9 (L) 07/11/2022 1856   ALT 9 07/11/2022 1856   ALKPHOS 58 07/11/2022 1856   BILITOT 0.4 07/11/2022 1856   GFRNONAA >60 07/11/2022 1856   GFRAA >60 08/28/2015 2150   Urinalysis    Component Value Date/Time   COLORURINE YELLOW 07/11/2022 1858   APPEARANCEUR HAZY (A) 07/11/2022 1858   LABSPEC 1.020 07/11/2022 1858   PHURINE 6.0 07/11/2022 1858   GLUCOSEU NEGATIVE 07/11/2022 1858   HGBUR NEGATIVE 07/11/2022 1858   BILIRUBINUR NEGATIVE 07/11/2022 1858   KETONESUR NEGATIVE 07/11/2022 1858   PROTEINUR NEGATIVE 07/11/2022 1858   UROBILINOGEN 0.2 08/11/2015 2134   NITRITE NEGATIVE 07/11/2022 1858   LEUKOCYTESUR TRACE (A) 07/11/2022 1858      Assessment and Plan: * Crohn's disease of ileum, with abscess ([place]) Now with worsening abscess, hydronephrosis from inflammation, and small amount of air (contained perforation). At this point patient has, in my mind at least, fairly clearly failed conservative and medical management, as well as IR drainage + ABx. Situation is probably surgical at this stage, though I understand general surgery is hesitant due to potential complications of surgery in a Crohn's  abdomen. EDP spoke with Dr. Marlou Starks: General surgery to consult in AM. EDP also spoke with Dr. Collene Mares: Dr. Benson Norway will consult in AM. In the meantime: Zosyn NPO IVF Dilaudid PRN pain  Hydroureteronephrosis Due to compression from inflammation, h/o same on recent admits. Worsening on left side today. Monitor renal fxn while in hospital See Discussion of Crohn's above.  Insulin-requiring or dependent type II diabetes mellitus (HCC) Cont Lantus Sensitive SSI Q4H  Need for pneumocystis prophylaxis Per ID Cont bactrim.  Hypokalemia Replace K Repeat BMP in AM      Advance Care Planning:   Code Status: Full Code  Consults: Dr. Marlou Starks and Dr. Collene Mares as discussed above  Family Communication: No family in room  Severity of Illness: The appropriate patient status for this patient is INPATIENT. Inpatient status is judged to be reasonable and necessary in order to provide the required intensity of service to ensure the patient's safety. The patient's presenting symptoms, physical exam findings, and initial radiographic and laboratory data in the context of their chronic comorbidities is felt to place them at high risk for further clinical deterioration. Furthermore, it is not anticipated that the patient will be medically stable for discharge from the hospital within 2 midnights of admission.   * I certify that at the point of admission it is my clinical judgment that the patient will require inpatient hospital care spanning beyond 2 midnights from the point of admission due to high intensity of service, high risk for further deterioration and high frequency of surveillance required.*  Author: Etta Quill., DO 07/11/2022 11:01 PM  For on call review [email or web address].

## 2022-07-12 DIAGNOSIS — K50014 Crohn's disease of small intestine with abscess: Secondary | ICD-10-CM | POA: Diagnosis not present

## 2022-07-12 DIAGNOSIS — E876 Hypokalemia: Secondary | ICD-10-CM | POA: Diagnosis not present

## 2022-07-12 DIAGNOSIS — N1330 Unspecified hydronephrosis: Secondary | ICD-10-CM | POA: Diagnosis not present

## 2022-07-12 DIAGNOSIS — E119 Type 2 diabetes mellitus without complications: Secondary | ICD-10-CM | POA: Diagnosis not present

## 2022-07-12 LAB — CBC
HCT: 26.8 % — ABNORMAL LOW (ref 36.0–46.0)
Hemoglobin: 8.7 g/dL — ABNORMAL LOW (ref 12.0–15.0)
MCH: 27.4 pg (ref 26.0–34.0)
MCHC: 32.5 g/dL (ref 30.0–36.0)
MCV: 84.5 fL (ref 80.0–100.0)
Platelets: 276 10*3/uL (ref 150–400)
RBC: 3.17 MIL/uL — ABNORMAL LOW (ref 3.87–5.11)
RDW: 17.8 % — ABNORMAL HIGH (ref 11.5–15.5)
WBC: 7.6 10*3/uL (ref 4.0–10.5)
nRBC: 0 % (ref 0.0–0.2)

## 2022-07-12 LAB — CBG MONITORING, ED
Glucose-Capillary: 133 mg/dL — ABNORMAL HIGH (ref 70–99)
Glucose-Capillary: 63 mg/dL — ABNORMAL LOW (ref 70–99)
Glucose-Capillary: 73 mg/dL (ref 70–99)
Glucose-Capillary: 85 mg/dL (ref 70–99)
Glucose-Capillary: 85 mg/dL (ref 70–99)
Glucose-Capillary: 86 mg/dL (ref 70–99)
Glucose-Capillary: 86 mg/dL (ref 70–99)

## 2022-07-12 LAB — BASIC METABOLIC PANEL
Anion gap: 6 (ref 5–15)
BUN: 6 mg/dL (ref 6–20)
CO2: 23 mmol/L (ref 22–32)
Calcium: 7.9 mg/dL — ABNORMAL LOW (ref 8.9–10.3)
Chloride: 107 mmol/L (ref 98–111)
Creatinine, Ser: 0.46 mg/dL (ref 0.44–1.00)
GFR, Estimated: >60 mL/min (ref >60)
Glucose, Bld: 80 mg/dL (ref 70–99)
Potassium: 2.9 mmol/L — ABNORMAL LOW (ref 3.5–5.1)
Sodium: 136 mmol/L (ref 135–145)

## 2022-07-12 LAB — URINE CULTURE: Culture: 10000 — AB

## 2022-07-12 LAB — PREALBUMIN: Prealbumin: 8 mg/dL — ABNORMAL LOW (ref 18–38)

## 2022-07-12 LAB — GLUCOSE, CAPILLARY: Glucose-Capillary: 101 mg/dL — ABNORMAL HIGH (ref 70–99)

## 2022-07-12 MED ORDER — POTASSIUM CHLORIDE 10 MEQ/100ML IV SOLN
10.0000 meq | INTRAVENOUS | Status: AC
  Administered 2022-07-12 (×4): 10 meq via INTRAVENOUS
  Filled 2022-07-12 (×4): qty 100

## 2022-07-12 NOTE — Hospital Course (Signed)
Sandra Foster is a 31 y.o. female with a history of diabetes mellitus type 2 on insulin and Crohn's disease. Patient presented secondary to abdominal pain and fever and found to have evidence of worsening Crohn's colitis. Empiric antibiotics initiated. General surgery consulted.

## 2022-07-12 NOTE — ED Notes (Signed)
Pt bgl 63, pt given orange juice.

## 2022-07-12 NOTE — Progress Notes (Signed)
PROGRESS NOTE    STATIA ROUTLEDGE  [id number] DOB: 1990/11/13 DOA: 07/11/2022 PCP: Jorge Ny, PA-C   Brief Narrative: Sandra Foster is a 31 y.o. female with a history of diabetes mellitus type 2 on insulin and Crohn's disease. Patient presented secondary to abdominal pain and fever and found to have evidence of worsening Crohn's colitis. Empiric antibiotics initiated. General surgery consulted.   Assessment and Plan: * Crohn's disease of ileum, with abscess ([place]) Worsening abscess and associated hydronephrosis. Significant pain. Contained perforation noted on imaging. General surgery consulted and are planning for surgical management on 9/15. -Continue Zosyn IV and analgesics -General surgery recommendations  Hydroureteronephrosis Secondary to adjacent inflammation. Slightly worsened left sided disease. No obstructing stone identified. Renal function is stable.  Insulin-requiring or dependent type II diabetes mellitus ([place]) Diabetes is uncontrolled with hyperglycemia. Most recent hemoglobin A1C of 9.6% from 8/23. Patient is on Lantus 11 units as an outpatient. -Continue Lantus  Need for pneumocystis prophylaxis Patient follows with ID. Currently on Bactrim. -Continue Bactrim DS three times weekly  Hypokalemia Potassium of 2.9 today after prior repletion. Normal magnesium -Continue potassium supplementation    DVT prophylaxis: SCDs Code Status:   Code Status: Full Code Family Communication: None at bedside Disposition Plan: Discharge home pending continued general surgery recommendations/management   Consultants:  General surgery  Procedures:  None  Antimicrobials: Zosyn IV    Subjective: Patient reports abdominal pain. No nausea/vomiting.  Objective: BP 107/66   Pulse 86   Temp 98 F (36.7 C)   Resp (!) 21   Ht 5\' 6"  (1.676 m)   Wt 55.8 kg   SpO2 100%   BMI 19.85 kg/m   Examination:  General exam: Appears calm and comfortable Respiratory  system: Clear to auscultation. Respiratory effort normal. Cardiovascular system: S1 & S2 heard, RRR. Gastrointestinal system: Abdomen is non-distended, soft and exquisitely tender in right quadrants. Normal bowel sounds heard. Central nervous system: Alert and oriented. No focal neurological deficits. Musculoskeletal: No edema. No calf tenderness Skin: No cyanosis. No rashes Psychiatry: Judgement and insight appear normal. Mood & affect appropriate.    Data Reviewed: I have personally reviewed following labs and imaging studies  CBC Lab Results  Component Value Date   WBC 7.6 07/12/2022   RBC 3.17 (L) 07/12/2022   HGB 8.7 (L) 07/12/2022   HCT 26.8 (L) 07/12/2022   MCV 84.5 07/12/2022   MCH 27.4 07/12/2022   PLT 276 07/12/2022   MCHC 32.5 07/12/2022   RDW 17.8 (H) 07/12/2022   LYMPHSABS 1.5 07/11/2022   MONOABS 1.1 (H) 07/11/2022   EOSABS 0.2 07/11/2022   BASOSABS 0.0 [id number]     Last metabolic panel Lab Results  Component Value Date   NA 136 07/12/2022   K 2.9 (L) 07/12/2022   CL 107 07/12/2022   CO2 23 07/12/2022   BUN 6 07/12/2022   CREATININE 0.46 07/12/2022   GLUCOSE 80 07/12/2022   GFRNONAA >60 07/12/2022   GFRAA >60 08/28/2015   CALCIUM 7.9 (L) 07/12/2022   PHOS 3.1 06/14/2022   PROT 6.2 (L) 07/11/2022   ALBUMIN 2.8 (L) 07/11/2022   BILITOT 0.4 07/11/2022   ALKPHOS 58 07/11/2022   AST 9 (L) 07/11/2022   ALT 9 07/11/2022   ANIONGAP 6 07/12/2022    GFR: Estimated Creatinine Clearance: 90.6 mL/min (by C-G formula based on SCr of 0.46 mg/dL).  Recent Results (from the past 240 hour(s))  Resp Panel by RT-PCR (Flu A&B, Covid)     Status:  None   Collection Time: 07/11/22  6:56 PM   Specimen: Nasal Swab  Result Value Ref Range Status   SARS Coronavirus 2 by RT PCR NEGATIVE NEGATIVE Final    Comment: (NOTE) SARS-CoV-2 target nucleic acids are NOT DETECTED.  The SARS-CoV-2 RNA is generally detectable in upper respiratory specimens during the acute  phase of infection. The lowest concentration of SARS-CoV-2 viral copies this assay can detect is 138 copies/mL. A negative result does not preclude SARS-Cov-2 infection and should not be used as the sole basis for treatment or other patient management decisions. A negative result may occur with  improper specimen collection/handling, submission of specimen other than nasopharyngeal swab, presence of viral mutation(s) within the areas targeted by this assay, and inadequate number of viral copies(<138 copies/mL). A negative result must be combined with clinical observations, patient history, and epidemiological information. The expected result is Negative.  Fact Sheet for Patients:  BloggerCourse.com  Fact Sheet for Healthcare Providers:  SeriousBroker.it  This test is no t yet approved or cleared by the [place] FDA and  has been authorized for detection and/or diagnosis of SARS-CoV-2 by FDA under an Emergency Use Authorization (EUA). This EUA will remain  in effect (meaning this test can be used) for the duration of the COVID-19 declaration under Section 564(b)(1) of the Act, 21 U.S.C.section 360bbb-3(b)(1), unless the authorization is terminated  or revoked sooner.       Influenza A by PCR NEGATIVE NEGATIVE Final   Influenza B by PCR NEGATIVE NEGATIVE Final    Comment: (NOTE) The Xpert Xpress SARS-CoV-2/FLU/RSV plus assay is intended as an aid in the diagnosis of influenza from Nasopharyngeal swab specimens and should not be used as a sole basis for treatment. Nasal washings and aspirates are unacceptable for Xpert Xpress SARS-CoV-2/FLU/RSV testing.  Fact Sheet for Patients: BloggerCourse.com  Fact Sheet for Healthcare Providers: SeriousBroker.it  This test is not yet approved or cleared by the [place] FDA and has been authorized for detection and/or diagnosis of  SARS-CoV-2 by FDA under an Emergency Use Authorization (EUA). This EUA will remain in effect (meaning this test can be used) for the duration of the COVID-19 declaration under Section 564(b)(1) of the Act, 21 U.S.C. section 360bbb-3(b)(1), unless the authorization is terminated or revoked.  Performed at [facility], [address], [place], [place] [address]       Radiology Studies: DG Chest 2 View  Result Date: 07/11/2022 CLINICAL DATA:  Fever; suspected sepsis EXAM: CHEST - 2 VIEW COMPARISON:  Radiographs 06/11/2022 FINDINGS: No focal consolidation, pleural effusion, or pneumothorax. Normal cardiomediastinal silhouette. No acute osseous abnormality. IMPRESSION: No active cardiopulmonary disease. Electronically Signed   By: Minerva Fester M.D.   On: 07/11/2022 20:04   CT Abdomen Pelvis W Contrast  Result Date: 07/11/2022 CLINICAL DATA:  Abdomen pain fever EXAM: CT ABDOMEN AND PELVIS WITH CONTRAST TECHNIQUE: Multidetector CT imaging of the abdomen and pelvis was performed using the standard protocol following bolus administration of intravenous contrast. RADIATION DOSE REDUCTION: This exam was performed according to the departmental dose-optimization program which includes automated exposure control, adjustment of the mA and/or kV according to patient size and/or use of iterative reconstruction technique. CONTRAST:  OMNIPAQUE IOHEXOL 300 MG/ML  SOLN COMPARISON:  CT 06/28/2022, 06/11/2022, 05/03/2022 FINDINGS: Lower chest: Lung bases demonstrate no acute airspace disease. Hepatobiliary: No focal liver abnormality is seen. No gallstones, gallbladder wall thickening, or biliary dilatation. Pancreas: Unremarkable. No pancreatic ductal dilatation or surrounding inflammatory changes. Spleen:  Normal in size without focal abnormality. Adrenals/Urinary Tract: Adrenal glands are within normal limits. Prominent right extrarenal pelvis, mildly increased compared to prior. Moderate  dilatation of left extrarenal pelvis and ureter, increased compared to most recent prior. Urinary bladder is unremarkable. Stomach/Bowel: The stomach is nondistended. Abnormal thickened small bowel loops with mucosal enhancement in the right lower quadrant worse as compared with 06/28/2022. wall thickening and inflammation at the cecum. Vascular/Lymphatic: Nonaneurysmal aorta. No increased adenopathy. Retroaortic left renal vein. Reproductive: Uterus and bilateral adnexa are unremarkable. Other: Right pelvic drainage catheter remains in place with small gas and fluid collection surrounding the pigtail. Interval irregular gas and fluid collection in the right lower quadrant measuring about 2.9 x 1.9 cm, series 2, image 50. Increased mildly loculated appearing pelvic effusions. No internal gas bubbles. Musculoskeletal: No acute or significant osseous findings. IMPRESSION: 1. Considerable inflammatory process in the right lower quadrant mostly involving distal small bowel, suspect for active inflammation/Crohn's. Inflammatory changes appear worse as compared with the CT from 06/28/2022. Right lower quadrant drainage catheter remains in place with small residual gas and fluid collection around the pigtail. Interim finding of small irregular gas and fluid collection in the right lower quadrant measuring up to 2.9 cm, this may reflect recurrent abscess or small area of contained perforation. Increased mildly loculated fluid within the pelvis. 2. Increased left hydronephrosis and hydroureter without obstructing stone. Slight increased dilatation of right extrarenal pelvis. Slightly thick-walled appearance of the urinary bladder, possible cystitis. Electronically Signed   By: Jasmine Pang M.D.   On: 07/11/2022 20:03      LOS: 1 day    Jacquelin Hawking, MD [facility] 07/12/2022, 7:17 AM   If 7PM-7AM, please contact night-coverage [email or web address]

## 2022-07-12 NOTE — TOC Progression Note (Signed)
Transition of Care [facility]) - Progression Note    Patient Details  Name: Sandra Foster MRN: [id number] Date of Birth: 1991-07-26  Transition of Care [facility]) CM/SW Contact  Geni Bers, RN Phone Number: 07/12/2022, 2:46 PM  Clinical Narrative:     Transition of Care (TOC) Screening Note   Patient Details  Name: Sandra Foster Date of Birth: [birth year]   Transition of Care [facility]) CM/SW Contact:    Geni Bers, RN Phone Number: 07/12/2022, 2:46 PM    Transition of Care Department [facility]) has reviewed patient and no TOC needs have been identified at this time. We will continue to monitor patient advancement through interdisciplinary progression rounds. If new patient transition needs arise, please place a TOC consult.          Expected Discharge Plan and Services                                                 Social Determinants of Health (SDOH) Interventions    Readmission Risk Interventions    06/13/2022   11:29 AM 06/04/2022   11:28 AM 04/23/2022    2:36 PM  Readmission Risk Prevention Plan  Transportation Screening  Complete Complete  PCP or Specialist Appt within 5-7 Days   Complete  PCP or Specialist Appt within 3-5 Days Complete    Home Care Screening   Complete  Medication Review (RN CM)   Complete  HRI or Home Care Consult Complete    Social Work Consult for Recovery Care Planning/Counseling Complete    Palliative Care Screening Not Applicable    Medication Review [employer]) Complete Complete   PCP or Specialist appointment within 3-5 days of discharge  Complete   HRI or Home Care Consult  Complete   SW Recovery Care/Counseling Consult  Complete   Palliative Care Screening  Not Applicable   Skilled Nursing Facility  Not Applicable

## 2022-07-12 NOTE — Progress Notes (Signed)
Subjective: See below.  Objective: Vital signs in last 24 hours: Temp:  [98 F (36.7 C)-101.5 F (38.6 C)] 98.3 F (36.8 C) (09/14 1503) Pulse Rate:  [81-108] 86 (09/14 1503) Resp:  [10-33] 17 (09/14 1503) BP: (92-130)/(44-98) 124/71 (09/14 1503) SpO2:  [98 %-100 %] 98 % (09/14 1503) Weight:  [55.8 kg] 55.8 kg (09/13 1851) Last BM Date : 07/08/22  Intake/Output from previous day: No intake/output data recorded. Intake/Output this shift: Total I/O In: -  Out: 5 [Drains:5]  General appearance: alert and no distress GI: tender in the RLQ, drainage  Lab Results: Recent Labs    07/11/22 1856 07/12/22 0529  WBC 9.6 7.6  HGB 9.9* 8.7*  HCT 30.5* 26.8*  PLT 307 276   BMET Recent Labs    07/11/22 1856 07/12/22 0529  NA 133* 136  K 2.9* 2.9*  CL 101 107  CO2 23 23  GLUCOSE 128* 80  BUN 9 6  CREATININE 0.51 0.46  CALCIUM 8.2* 7.9*   LFT Recent Labs    07/11/22 1856  PROT 6.2*  ALBUMIN 2.8*  AST 9*  ALT 9  ALKPHOS 58  BILITOT 0.4   PT/INR Recent Labs    07/11/22 1856  LABPROT 15.1  INR 1.2   Hepatitis Panel No results for input(s): "HEPBSAG", "HCVAB", "HEPAIGM", "HEPBIGM" in the last 72 hours. C-Diff No results for input(s): "CDIFFTOX" in the last 72 hours. Fecal Lactopherrin No results for input(s): "FECLLACTOFRN" in the last 72 hours.  Studies/Results: DG Chest 2 View  Result Date: 07/11/2022 CLINICAL DATA:  Fever; suspected sepsis EXAM: CHEST - 2 VIEW COMPARISON:  Radiographs 06/11/2022 FINDINGS: No focal consolidation, pleural effusion, or pneumothorax. Normal cardiomediastinal silhouette. No acute osseous abnormality. IMPRESSION: No active cardiopulmonary disease. Electronically Signed   By: Minerva Fester M.D.   On: 07/11/2022 20:04   CT Abdomen Pelvis W Contrast  Result Date: 07/11/2022 CLINICAL DATA:  Abdomen pain fever EXAM: CT ABDOMEN AND PELVIS WITH CONTRAST TECHNIQUE: Multidetector CT imaging of the abdomen and pelvis was performed  using the standard protocol following bolus administration of intravenous contrast. RADIATION DOSE REDUCTION: This exam was performed according to the departmental dose-optimization program which includes automated exposure control, adjustment of the mA and/or kV according to patient size and/or use of iterative reconstruction technique. CONTRAST:  OMNIPAQUE IOHEXOL 300 MG/ML  SOLN COMPARISON:  CT 06/28/2022, 06/11/2022, 05/03/2022 FINDINGS: Lower chest: Lung bases demonstrate no acute airspace disease. Hepatobiliary: No focal liver abnormality is seen. No gallstones, gallbladder wall thickening, or biliary dilatation. Pancreas: Unremarkable. No pancreatic ductal dilatation or surrounding inflammatory changes. Spleen: Normal in size without focal abnormality. Adrenals/Urinary Tract: Adrenal glands are within normal limits. Prominent right extrarenal pelvis, mildly increased compared to prior. Moderate dilatation of left extrarenal pelvis and ureter, increased compared to most recent prior. Urinary bladder is unremarkable. Stomach/Bowel: The stomach is nondistended. Abnormal thickened small bowel loops with mucosal enhancement in the right lower quadrant worse as compared with 06/28/2022. wall thickening and inflammation at the cecum. Vascular/Lymphatic: Nonaneurysmal aorta. No increased adenopathy. Retroaortic left renal vein. Reproductive: Uterus and bilateral adnexa are unremarkable. Other: Right pelvic drainage catheter remains in place with small gas and fluid collection surrounding the pigtail. Interval irregular gas and fluid collection in the right lower quadrant measuring about 2.9 x 1.9 cm, series 2, image 50. Increased mildly loculated appearing pelvic effusions. No internal gas bubbles. Musculoskeletal: No acute or significant osseous findings. IMPRESSION: 1. Considerable inflammatory process in the right lower quadrant mostly involving  distal small bowel, suspect for active inflammation/Crohn's.  Inflammatory changes appear worse as compared with the CT from 06/28/2022. Right lower quadrant drainage catheter remains in place with small residual gas and fluid collection around the pigtail. Interim finding of small irregular gas and fluid collection in the right lower quadrant measuring up to 2.9 cm, this may reflect recurrent abscess or small area of contained perforation. Increased mildly loculated fluid within the pelvis. 2. Increased left hydronephrosis and hydroureter without obstructing stone. Slight increased dilatation of right extrarenal pelvis. Slightly thick-walled appearance of the urinary bladder, possible cystitis. Electronically Signed   By: Jasmine Pang M.D.   On: 07/11/2022 20:03    Medications: Scheduled:  insulin aspart  0-9 Units Subcutaneous Q4H   insulin glargine-yfgn  11 Units Subcutaneous QHS   pantoprazole  40 mg Oral Daily   [START ON 07/13/2022] sulfamethoxazole-trimethoprim  1 tablet Oral Once per day on Mon Wed Fri   Continuous:  lactated ringers 100 mL/hr at 07/12/22 0724   piperacillin-tazobactam (ZOSYN)  IV 3.375 g (07/12/22 1141)    Assessment/Plan: 1) Uncontrolled Crohn's disease. 2) Recurrent abscess.   The patient reports feeling well until 3 days ago.  She had her one and only infusion of infliximab approximately one month ago.  She responded very well, but she was not able to receive a second infusion.  Her Medicaid was cancelled, but she reports obtaining coverage again.  The CT scan today shows a worsening of her abscess.  At this point it seems that surgery may be the only option to bring her into remission.  Infliximab will need to be restarted 1 month or more after surgery.  Plan: 1) Continue with Zosyn. 2) Await Dr. Lucilla Lame assessment.   LOS: 1 day   Amour Trigg D 07/12/2022, 4:41 PM

## 2022-07-12 NOTE — Inpatient Diabetes Management (Signed)
Inpatient Diabetes Program Recommendations  [facility]: New Consensus Statement on Inpatient Glycemic Control (2015)  Target Ranges:  Prepandial:   less than 140 mg/dL      Peak postprandial:   less than 180 mg/dL (1-2 hours)      Critically ill patients:  140 - 180 mg/dL   Lab Results  Component Value Date   GLUCAP 133 (H) 07/12/2022   HGBA1C 9.6 (H) 06/03/2022    Review of Glycemic Control  Latest Reference Range & Units 07/11/22 22:23 07/12/22 00:16 07/12/22 03:58 07/12/22 05:26 07/12/22 08:08 07/12/22 12:31 07/12/22 13:42  Glucose-Capillary 70 - 99 mg/dL 86 85 85 86 73 63 (L)  Given OJ 133 (H)  (L): Data is abnormally low (H): Data is abnormally high  Diabetes history:  DM2  Outpatient Diabetes medications:  Lantus 11 units QHS  Current orders for Inpatient glycemic control:  Semglee 11 units QHS to start tonight  Novolog 0-9 units Q4H-NPO  Inpatient Diabetes Program Recommendations:    Hypoglycemia of 63 mg/dL treated with orange juice at 12:30.  Glucose increased to 133 mg/dL.  Please consider holding off on Semglee until glucose is > 150 mg/dl.  At that point might consider a reduced dose of basal insulin.    Will continue to follow while inpatient.  Thank you, Dulce Sellar, MSN, [facility] Diabetes Coordinator Inpatient Diabetes Program [phone] (team pager from 8a-5p)

## 2022-07-12 NOTE — Consult Note (Addendum)
Sandra Foster 1991/01/29  [id number].    Requesting MD: Julian Reil, MD Chief Complaint/Reason for Consult: complicated Chron's disease  HPI:  Sandra Foster is a 31 year old female, known to [facility] service, who presents to the ER with 3 days of right lower quadrant pain and abdominal distention.  Patient has been battling a partial small bowel obstruction related to Crohn's disease of the terminal ileum since June 2023.  She was admitted to the hospital twice in August where IR drains were placed into intra-abdominal abscesses and a contained perforation of the small intestine.  She was treated with IV antibiotics steroids, and started on Remicade and was recently discharged from the hospital 06/16/2022.  She tells me that she did well at home for many weeks, most days of the week she has been able to eat full meals, for example an entire takeout meal like a burger and fries. She was able to go to her son's orientation and take him to his first days of elementary school this year.  During her last hospital stay she reports she had lost her Medicaid, which she recently got back.  She is waiting for her Medicaid card.  For this reason she has not been seen by GI office, Dr. Loreta Ave, for any additional doses of Remicade. Her drain has remained in place and she has been following up with infectious disease, she states they stopped her prednisone 2 weeks ago and started her on Bactrim.  Patient tells me that 3 days ago she developed significant right lower quadrant pain, associated with abdominal distention and decreased oral intake.  She states that she is passing gas but has not had a bowel movement for 5 days.  She has tried MiraLAX and other over-the-counter laxatives without success.  In the ED today she has a fever.  At baseline she lives at home with her son who is 71 years old.  She also has a significant other, a boyfriend.  She has a very supportive aunt, who raised her, and is a big part of her life and  who she considers her mother.  ROS: As above Review of Systems  All other systems reviewed and are negative.   Family History  Problem Relation Age of Onset   Hypertension Maternal Grandmother     Past Medical History:  Diagnosis Date   Chlamydia    Crohn's disease (HCC)    Diabetes mellitus without complication (HCC)    Fetal demise    hyperglycemic hyperosmolar nonketonic syndrome   Hernia    Pregnancy induced hypertension    Pyelonephritis     Past Surgical History:  Procedure Laterality Date   BIOPSY  01/09/2022   Procedure: BIOPSY;  Surgeon: Jeani Hawking, MD;  Location: [facility] ENDOSCOPY;  Service: Gastroenterology;;   COLONOSCOPY WITH PROPOFOL N/A 01/09/2022   Procedure: COLONOSCOPY WITH PROPOFOL;  Surgeon: Jeani Hawking, MD;  Location: [facility] ENDOSCOPY;  Service: Gastroenterology;  Laterality: N/A;   HERNIA REPAIR     IR RADIOLOGIST EVAL & MGMT  06/28/2022    Social History:  reports that she has quit smoking. Her smoking use included cigarettes. She smoked an average of .25 packs per day. She quit smokeless tobacco use about 11 years ago. She reports that she does not drink alcohol and does not use drugs.  Allergies:  Allergies  Allergen Reactions   Nsaids Other (See Comments)    Crohn's disease = NO NSAIDs    (Not in a hospital admission)    Physical  Exam: Blood pressure 112/72, pulse 85, temperature 98.2 F (36.8 C), temperature source Oral, resp. rate 13, height 5\' 6"  (1.676 m), weight 55.8 kg, SpO2 100 %. General: Pleasant black female laying on hospital bed, appears stated age, NAD. HEENT: head -normocephalic, atraumatic; Eyes: PERRLA, no conjunctival injection, anicteric sclerae Neck- Trachea is midline, no thyromegaly or JVD appreciated.  CV- RRR, normal S1/S2, no M/R/G, no lower extremity edema  Pulm- breathing is non-labored. CTABL, no wheezes, rhales, rhonchi. Abd- soft, mild distention, TTP RLQ without rebound tenderness, IR drain in place to gravity  with yellow enteric contents in bag.  GU- deferred  MSK- UE/LE symmetrical, no cyanosis, clubbing, or edema. Neuro- non-focal exam. no paresthesias. Psych- Alert and Oriented x3 with appropriate affect Skin: warm and dry, no rashes or lesions   Results for orders placed or performed during the hospital encounter of 07/11/22 (from the past 48 hour(s))  Comprehensive metabolic panel     Status: Abnormal   Collection Time: 07/11/22  6:56 PM  Result Value Ref Range   Sodium 133 (L) 135 - 145 mmol/L   Potassium 2.9 (L) 3.5 - 5.1 mmol/L   Chloride 101 98 - 111 mmol/L   CO2 23 22 - 32 mmol/L   Glucose, Bld 128 (H) 70 - 99 mg/dL    Comment: Glucose reference range applies only to samples taken after fasting for at least 8 hours.   BUN 9 6 - 20 mg/dL   Creatinine, Ser 0.51 0.44 - 1.00 mg/dL   Calcium 8.2 (L) 8.9 - 10.3 mg/dL   Total Protein 6.2 (L) 6.5 - 8.1 g/dL   Albumin 2.8 (L) 3.5 - 5.0 g/dL   AST 9 (L) 15 - 41 U/L   ALT 9 0 - 44 U/L   Alkaline Phosphatase 58 38 - 126 U/L   Total Bilirubin 0.4 0.3 - 1.2 mg/dL   GFR, Estimated >60 >60 mL/min    Comment: (NOTE) Calculated using the CKD-EPI Creatinine Equation (2021)    Anion gap 9 5 - 15    Comment: Performed at [facility], [place] [address], [place], [place] [address]  Lactic acid, plasma     Status: None   Collection Time: 07/11/22  6:56 PM  Result Value Ref Range   Lactic Acid, Venous 1.0 0.5 - 1.9 mmol/L    Comment: Performed at [facility], [place] [address] [place], [place] [address]  CBC with Differential     Status: Abnormal   Collection Time: 07/11/22  6:56 PM  Result Value Ref Range   WBC 9.6 4.0 - 10.5 K/uL   RBC 3.65 (L) 3.87 - 5.11 MIL/uL   Hemoglobin 9.9 (L) 12.0 - 15.0 g/dL   HCT 30.5 (L) 36.0 - 46.0 %   MCV 83.6 80.0 - 100.0 fL   MCH 27.1 26.0 - 34.0 pg   MCHC 32.5 30.0 - 36.0 g/dL   RDW 17.5 (H) 11.5 - 15.5 %   Platelets 307 150 - 400 K/uL   nRBC 0.0 0.0 - 0.2 %    Neutrophils Relative % 71 %   Neutro Abs 6.7 1.7 - 7.7 K/uL   Lymphocytes Relative 16 %   Lymphs Abs 1.5 0.7 - 4.0 K/uL   Monocytes Relative 11 %   Monocytes Absolute 1.1 (H) 0.1 - 1.0 K/uL   Eosinophils Relative 2 %   Eosinophils Absolute 0.2 0.0 - 0.5 K/uL   Basophils Relative 0 %   Basophils Absolute 0.0 0.0 - 0.1  K/uL   Immature Granulocytes 0 %   Abs Immature Granulocytes 0.02 0.00 - 0.07 K/uL    Comment: Performed at [facility], [address], [place], [place] [address]  Protime-INR     Status: None   Collection Time: 07/11/22  6:56 PM  Result Value Ref Range   Prothrombin Time 15.1 11.4 - 15.2 seconds   INR 1.2 0.8 - 1.2    Comment: (NOTE) INR goal varies based on device and disease states. Performed at [facility], [address], [place], [place] [address]   Resp Panel by RT-PCR (Flu A&B, Covid)     Status: None   Collection Time: 07/11/22  6:56 PM   Specimen: Nasal Swab  Result Value Ref Range   SARS Coronavirus 2 by RT PCR NEGATIVE NEGATIVE    Comment: (NOTE) SARS-CoV-2 target nucleic acids are NOT DETECTED.  The SARS-CoV-2 RNA is generally detectable in upper respiratory specimens during the acute phase of infection. The lowest concentration of SARS-CoV-2 viral copies this assay can detect is 138 copies/mL. A negative result does not preclude SARS-Cov-2 infection and should not be used as the sole basis for treatment or other patient management decisions. A negative result may occur with  improper specimen collection/handling, submission of specimen other than nasopharyngeal swab, presence of viral mutation(s) within the areas targeted by this assay, and inadequate number of viral copies(<138 copies/mL). A negative result must be combined with clinical observations, patient history, and epidemiological information. The expected result is Negative.  Fact Sheet for Patients:   BloggerCourse.com  Fact Sheet for Healthcare Providers:  SeriousBroker.it  This test is no t yet approved or cleared by the [place] FDA and  has been authorized for detection and/or diagnosis of SARS-CoV-2 by FDA under an Emergency Use Authorization (EUA). This EUA will remain  in effect (meaning this test can be used) for the duration of the COVID-19 declaration under Section 564(b)(1) of the Act, 21 U.S.C.section 360bbb-3(b)(1), unless the authorization is terminated  or revoked sooner.       Influenza A by PCR NEGATIVE NEGATIVE   Influenza B by PCR NEGATIVE NEGATIVE    Comment: (NOTE) The Xpert Xpress SARS-CoV-2/FLU/RSV plus assay is intended as an aid in the diagnosis of influenza from Nasopharyngeal swab specimens and should not be used as a sole basis for treatment. Nasal washings and aspirates are unacceptable for Xpert Xpress SARS-CoV-2/FLU/RSV testing.  Fact Sheet for Patients: BloggerCourse.com  Fact Sheet for Healthcare Providers: SeriousBroker.it  This test is not yet approved or cleared by the [place] FDA and has been authorized for detection and/or diagnosis of SARS-CoV-2 by FDA under an Emergency Use Authorization (EUA). This EUA will remain in effect (meaning this test can be used) for the duration of the COVID-19 declaration under Section 564(b)(1) of the Act, 21 U.S.C. section 360bbb-3(b)(1), unless the authorization is terminated or revoked.  Performed at [facility], [address] [place], [place] [address]   APTT     Status: Abnormal   Collection Time: 07/11/22  6:56 PM  Result Value Ref Range   aPTT 38 (H) 24 - 36 seconds    Comment:        IF BASELINE aPTT IS ELEVATED, SUGGEST PATIENT RISK ASSESSMENT BE USED TO DETERMINE APPROPRIATE ANTICOAGULANT THERAPY. Performed at [facility], [address], [place], [place] [address]   Magnesium     Status: None   Collection Time: 07/11/22  6:56  PM  Result Value Ref Range   Magnesium 1.8 1.7 - 2.4 mg/dL    Comment: Performed at [facility], [place] [address] [place], [place] [address]  Culture, blood (Routine x 2)     Status: None (Preliminary result)   Collection Time: 07/11/22  6:58 PM   Specimen: BLOOD  Result Value Ref Range   Specimen Description      BLOOD BLOOD LEFT HAND Performed at [place] [address] [place], [place] [address]    Special Requests      BOTTLES DRAWN AEROBIC AND ANAEROBIC Blood Culture adequate volume Performed at [place] [address], [place], [place] [address]    Culture      NO GROWTH < 12 HOURS Performed at [place] [address], [place], [place] [address]    Report Status PENDING   Urinalysis, Routine w reflex microscopic     Status: Abnormal   Collection Time: 07/11/22  6:58 PM  Result Value Ref Range   Color, Urine YELLOW YELLOW   APPearance HAZY (A) CLEAR   Specific Gravity, Urine 1.020 1.005 - 1.030   pH 6.0 5.0 - 8.0   Glucose, UA NEGATIVE NEGATIVE mg/dL   Hgb urine dipstick NEGATIVE NEGATIVE   Bilirubin Urine NEGATIVE NEGATIVE   Ketones, ur NEGATIVE NEGATIVE mg/dL   Protein, ur NEGATIVE NEGATIVE mg/dL   Nitrite NEGATIVE NEGATIVE   Leukocytes,Ua TRACE (A) NEGATIVE   RBC / HPF 0-5 0 - 5 RBC/hpf   WBC, UA 6-10 0 - 5 WBC/hpf   Bacteria, UA NONE SEEN NONE SEEN   Squamous Epithelial / LPF 11-20 0 - 5   Mucus PRESENT     Comment: Performed at [facility], [place] [address], [place], [place] [address]  I-Stat beta hCG blood, ED     Status: None   Collection Time: 07/11/22  7:00 PM  Result Value Ref Range   I-stat hCG, quantitative <5.0 <5 mIU/mL   Comment 3            Comment:   GEST. AGE      CONC.  (mIU/mL)   <=1 WEEK        5 - 50     2 WEEKS       50 - 500     3 WEEKS        100 - 10,000     4 WEEKS     1,000 - 30,000        FEMALE AND NON-PREGNANT FEMALE:     LESS THAN 5 mIU/mL   Culture, blood (Routine x 2)     Status: None (Preliminary result)   Collection Time: 07/11/22  9:15 PM   Specimen: BLOOD  Result Value Ref Range   Specimen Description      BLOOD SITE NOT SPECIFIED Performed at [place] [address], [place], [place] [address]    Special Requests      BLOOD Blood Culture results may not be optimal due to an excessive volume of blood received in culture bottles Performed at [place] [address] [place], [place] [address]    Culture      NO GROWTH < 12 HOURS Performed at [facility] [address], [place], [place] [address]    Report Status PENDING   CBG monitoring, ED     Status: None   Collection Time: 07/11/22 10:23 PM  Result Value Ref Range   Glucose-Capillary 86 70 - 99 mg/dL    Comment: Glucose reference range applies only to samples taken after fasting for at least 8 hours.  CBG monitoring, ED     Status: None   Collection Time: 07/12/22 12:16 AM  Result Value Ref Range   Glucose-Capillary 85 70 - 99 mg/dL    Comment: Glucose reference range applies only to samples taken after fasting for at least 8 hours.  CBG monitoring, ED     Status: None   Collection Time: 07/12/22  3:58 AM  Result Value Ref Range   Glucose-Capillary 85 70 - 99 mg/dL    Comment: Glucose reference range applies only to samples taken after fasting for at least 8 hours.  CBG monitoring, ED     Status: None   Collection Time: 07/12/22  5:26 AM  Result Value Ref Range   Glucose-Capillary 86 70 - 99 mg/dL    Comment: Glucose reference range applies only to samples taken after fasting for at least 8 hours.  CBC     Status: Abnormal   Collection Time: 07/12/22  5:29 AM  Result Value Ref Range   WBC 7.6 4.0 - 10.5 K/uL   RBC 3.17 (L) 3.87 - 5.11 MIL/uL   Hemoglobin 8.7 (L) 12.0 - 15.0 g/dL   HCT  53.6 (L) 14.4 - 46.0 %   MCV 84.5 80.0 - 100.0 fL   MCH 27.4 26.0 - 34.0 pg   MCHC 32.5 30.0 - 36.0 g/dL   RDW 31.5 (H) 40.0 - 86.7 %   Platelets 276 150 - 400 K/uL   nRBC 0.0 0.0 - 0.2 %    Comment: Performed at [facility], [place], [address] [place], [place] [address]  Basic metabolic panel     Status: Abnormal   Collection Time: 07/12/22  5:29 AM  Result Value Ref Range   Sodium 136 135 - 145 mmol/L   Potassium 2.9 (L) 3.5 - 5.1 mmol/L   Chloride 107 98 - 111 mmol/L   CO2 23 22 - 32 mmol/L   Glucose, Bld 80 70 - 99 mg/dL    Comment: Glucose reference range applies only to samples taken after fasting for at least 8 hours.   BUN 6 6 - 20 mg/dL   Creatinine, Ser 9.32 0.44 - 1.00 mg/dL   Calcium 7.9 (L) 8.9 - 10.3 mg/dL   GFR, Estimated >67 >12 mL/min    Comment: (NOTE) Calculated using the CKD-EPI Creatinine Equation (2021)    Anion gap 6 5 - 15    Comment: Performed at [facility], [address] [place], [place] [address]  CBG monitoring, ED     Status: None   Collection Time: 07/12/22  8:08 AM  Result Value Ref Range   Glucose-Capillary 73 70 - 99 mg/dL    Comment: Glucose reference range applies only to samples taken after fasting for at least 8 hours.   DG Chest 2 View  Result Date: 07/11/2022 CLINICAL DATA:  Fever; suspected sepsis EXAM: CHEST - 2 VIEW COMPARISON:  Radiographs 06/11/2022 FINDINGS: No focal consolidation, pleural effusion, or pneumothorax. Normal cardiomediastinal silhouette. No acute osseous abnormality. IMPRESSION: No active cardiopulmonary disease. Electronically Signed   By: Minerva Fester M.D.   On: 07/11/2022 20:04   CT Abdomen Pelvis W Contrast  Result Date: 07/11/2022 CLINICAL DATA:  Abdomen pain fever EXAM: CT ABDOMEN AND PELVIS WITH CONTRAST TECHNIQUE: Multidetector CT imaging of the abdomen and pelvis  was performed using the standard protocol following bolus administration of intravenous contrast. RADIATION  DOSE REDUCTION: This exam was performed according to the departmental dose-optimization program which includes automated exposure control, adjustment of the mA and/or kV according to patient size and/or use of iterative reconstruction technique. CONTRAST:  174mL OMNIPAQUE IOHEXOL 300 MG/ML  SOLN COMPARISON:  CT 06/28/2022, 06/11/2022, 05/03/2022 FINDINGS: Lower chest: Lung bases demonstrate no acute airspace disease. Hepatobiliary: No focal liver abnormality is seen. No gallstones, gallbladder wall thickening, or biliary dilatation. Pancreas: Unremarkable. No pancreatic ductal dilatation or surrounding inflammatory changes. Spleen: Normal in size without focal abnormality. Adrenals/Urinary Tract: Adrenal glands are within normal limits. Prominent right extrarenal pelvis, mildly increased compared to prior. Moderate dilatation of left extrarenal pelvis and ureter, increased compared to most recent prior. Urinary bladder is unremarkable. Stomach/Bowel: The stomach is nondistended. Abnormal thickened small bowel loops with mucosal enhancement in the right lower quadrant worse as compared with 06/28/2022. wall thickening and inflammation at the cecum. Vascular/Lymphatic: Nonaneurysmal aorta. No increased adenopathy. Retroaortic left renal vein. Reproductive: Uterus and bilateral adnexa are unremarkable. Other: Right pelvic drainage catheter remains in place with small gas and fluid collection surrounding the pigtail. Interval irregular gas and fluid collection in the right lower quadrant measuring about 2.9 x 1.9 cm, series 2, image 50. Increased mildly loculated appearing pelvic effusions. No internal gas bubbles. Musculoskeletal: No acute or significant osseous findings. IMPRESSION: 1. Considerable inflammatory process in the right lower quadrant mostly involving distal small bowel, suspect for active inflammation/Crohn's. Inflammatory changes appear worse as compared with the CT from 06/28/2022. Right lower quadrant  drainage catheter remains in place with small residual gas and fluid collection around the pigtail. Interim finding of small irregular gas and fluid collection in the right lower quadrant measuring up to 2.9 cm, this may reflect recurrent abscess or small area of contained perforation. Increased mildly loculated fluid within the pelvis. 2. Increased left hydronephrosis and hydroureter without obstructing stone. Slight increased dilatation of right extrarenal pelvis. Slightly thick-walled appearance of the urinary bladder, possible cystitis. Electronically Signed   By: Donavan Foil M.D.   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  - discharged 8/19 on prednisone and received a dose of Remicade 06/13/22, states ID stopped her prednisone about 2 weeks ago.  - CT A/P 9/13 shows increased RLQ inflammation consistent with active Crohn's, RLQ drain within a small gas/fluid collection, some simple appearing fluid in the pelvis. Also w/ increased L hydronephrosis and dilation of R extra-renal pelvis.  - continue IV Zosyn, continue IR drain -Patient continues to struggle with active Crohn's disease and represents with signs of intra-abdominal infection related to intra-abdominal abscesses, small bowel fistula.  She has mild pSBO symptoms related to this process but no significant signs of high-grade bowel obstruction on her CT scan.  Her last dose of Remicade was 1 month ago and she has been off steroids for a couple of weeks.  Ideally, now that she has Medicaid, she could get adequate medical treatment for her Crohn's disease with some improvement.  However, at this juncture, I do have concerns that surgical intervention with small bowel resection and  diverting ileostomy might be necessary.  Based on her history, she has had adequate nutrition  for the last 3 to 4 weeks.  Her uncontrolled diabetes, recent steroid use, and active infection/inflammation do put her at an increased risk for perioperative morbidity.  Will discuss further with colorectal surgeon on-call Dr. Nadeen Landau.  Allow clear liquid diet today, n.p.o. after midnight for possible surgical intervention.   Agree with GI consult Consider infectious disease consult    FEN - CLD, NPO after MN; prealbumin penidng, albumin 2.8 VTE - SCD's, lovenox ok ID - Zosyn Admit - TRH service  IDDM - hgb A1c 06/03/22 was 9.6   I reviewed nursing notes, ED provider notes, hospitalist notes, last 24 h vitals and pain scores, last 48 h intake and output, last 24 h labs and trends, and last 24 h imaging results.  Jill Alexanders, [facility] Surgery 07/12/2022, 10:29 AM Please see Amion for pager number during day hours 7:00am-4:30pm or 7:00am -11:30am on weekends

## 2022-07-13 ENCOUNTER — Inpatient Hospital Stay (HOSPITAL_COMMUNITY): Payer: Medicaid Other | Admitting: Certified Registered Nurse Anesthetist

## 2022-07-13 ENCOUNTER — Encounter (HOSPITAL_COMMUNITY)
Admission: EM | Disposition: A | Discharge status: Home / Self Care | Payer: Self-pay | Source: Home / Self Care | Attending: Family Medicine

## 2022-07-13 ENCOUNTER — Inpatient Hospital Stay (HOSPITAL_COMMUNITY): Payer: Medicaid Other

## 2022-07-13 ENCOUNTER — Encounter (HOSPITAL_COMMUNITY): Payer: Self-pay | Admitting: Internal Medicine

## 2022-07-13 ENCOUNTER — Other Ambulatory Visit: Payer: Self-pay

## 2022-07-13 DIAGNOSIS — Z87891 Personal history of nicotine dependence: Secondary | ICD-10-CM

## 2022-07-13 DIAGNOSIS — E119 Type 2 diabetes mellitus without complications: Secondary | ICD-10-CM

## 2022-07-13 DIAGNOSIS — K50014 Crohn's disease of small intestine with abscess: Secondary | ICD-10-CM | POA: Diagnosis not present

## 2022-07-13 DIAGNOSIS — K50918 Crohn's disease, unspecified, with other complication: Secondary | ICD-10-CM

## 2022-07-13 DIAGNOSIS — I10 Essential (primary) hypertension: Secondary | ICD-10-CM

## 2022-07-13 HISTORY — PX: CYSTOSCOPY W/ URETERAL STENT PLACEMENT: SHX1429

## 2022-07-13 HISTORY — PX: PARTIAL COLECTOMY: SHX5273

## 2022-07-13 LAB — GLUCOSE, CAPILLARY
Glucose-Capillary: 102 mg/dL — ABNORMAL HIGH (ref 70–99)
Glucose-Capillary: 80 mg/dL (ref 70–99)
Glucose-Capillary: 89 mg/dL (ref 70–99)
Glucose-Capillary: 95 mg/dL (ref 70–99)
Glucose-Capillary: 135 mg/dL — ABNORMAL HIGH (ref 70–99)
Glucose-Capillary: 163 mg/dL — ABNORMAL HIGH (ref 70–99)
Glucose-Capillary: 118 mg/dL — ABNORMAL HIGH (ref 70–99)
Glucose-Capillary: 123 mg/dL — ABNORMAL HIGH (ref 70–99)

## 2022-07-13 LAB — BASIC METABOLIC PANEL
Anion gap: 3 — ABNORMAL LOW (ref 5–15)
BUN: <5 mg/dL — ABNORMAL LOW (ref 6–20)
CO2: 29 mmol/L (ref 22–32)
Calcium: 8.3 mg/dL — ABNORMAL LOW (ref 8.9–10.3)
Chloride: 109 mmol/L (ref 98–111)
Creatinine, Ser: 0.44 mg/dL (ref 0.44–1.00)
GFR, Estimated: >60 mL/min (ref >60)
Glucose, Bld: 65 mg/dL — ABNORMAL LOW (ref 70–99)
Potassium: 3.3 mmol/L — ABNORMAL LOW (ref 3.5–5.1)
Sodium: 141 mmol/L (ref 135–145)

## 2022-07-13 LAB — TYPE AND SCREEN
ABO/RH(D): O POS
Antibody Screen: NEGATIVE

## 2022-07-13 LAB — SURGICAL PCR SCREEN
MRSA, PCR: NEGATIVE
Staphylococcus aureus: NEGATIVE

## 2022-07-13 SURGERY — COLECTOMY, PARTIAL
Anesthesia: General | Laterality: Right

## 2022-07-13 MED ORDER — HYDROMORPHONE HCL 1 MG/ML IJ SOLN
INTRAMUSCULAR | Status: DC | PRN
  Administered 2022-07-13 (×4): .5 mg via INTRAVENOUS

## 2022-07-13 MED ORDER — ACETAMINOPHEN 10 MG/ML IV SOLN
1000.0000 mg | Freq: Four times a day (QID) | INTRAVENOUS | Status: AC
  Administered 2022-07-13 – 2022-07-14 (×4): 1000 mg via INTRAVENOUS
  Filled 2022-07-13 (×3): qty 100

## 2022-07-13 MED ORDER — METHOCARBAMOL 1000 MG/10ML IJ SOLN
1000.0000 mg | Freq: Three times a day (TID) | INTRAVENOUS | Status: DC | PRN
  Administered 2022-07-14 – 2022-07-15 (×3): 1000 mg via INTRAVENOUS
  Filled 2022-07-13 (×2): qty 1000
  Filled 2022-07-13: qty 10
  Filled 2022-07-13: qty 1000

## 2022-07-13 MED ORDER — FENTANYL CITRATE (PF) 250 MCG/5ML IJ SOLN
INTRAMUSCULAR | Status: DC | PRN
  Administered 2022-07-13 (×5): 50 ug via INTRAVENOUS

## 2022-07-13 MED ORDER — KETAMINE HCL 10 MG/ML IJ SOLN
INTRAMUSCULAR | Status: AC
  Filled 2022-07-13: qty 1

## 2022-07-13 MED ORDER — SODIUM CHLORIDE 0.9 % IV SOLN
INTRAVENOUS | Status: AC
  Filled 2022-07-13: qty 2

## 2022-07-13 MED ORDER — BUPIVACAINE LIPOSOME 1.3 % IJ SUSP
INTRAMUSCULAR | Status: AC
  Filled 2022-07-13: qty 20

## 2022-07-13 MED ORDER — POTASSIUM CHLORIDE 10 MEQ/100ML IV SOLN
10.0000 meq | INTRAVENOUS | Status: AC
  Administered 2022-07-13 (×4): 10 meq via INTRAVENOUS
  Filled 2022-07-13 (×2): qty 100

## 2022-07-13 MED ORDER — STERILE WATER FOR IRRIGATION IR SOLN
Status: DC | PRN
  Administered 2022-07-13: 1000 mL

## 2022-07-13 MED ORDER — HEPARIN SODIUM (PORCINE) 5000 UNIT/ML IJ SOLN
INTRAMUSCULAR | Status: AC
  Filled 2022-07-13: qty 1

## 2022-07-13 MED ORDER — ROCURONIUM BROMIDE 10 MG/ML (PF) SYRINGE
PREFILLED_SYRINGE | INTRAVENOUS | Status: AC
  Filled 2022-07-13: qty 10

## 2022-07-13 MED ORDER — LIDOCAINE 2% (20 MG/ML) 5 ML SYRINGE
INTRAMUSCULAR | Status: DC | PRN
  Administered 2022-07-13: 100 mg via INTRAVENOUS
  Administered 2022-07-13: 1.5 mg/kg/h via INTRAVENOUS

## 2022-07-13 MED ORDER — HYDROMORPHONE HCL 1 MG/ML IJ SOLN
0.2500 mg | INTRAMUSCULAR | Status: DC | PRN
  Administered 2022-07-13 (×2): 0.5 mg via INTRAVENOUS

## 2022-07-13 MED ORDER — DEXAMETHASONE SODIUM PHOSPHATE 10 MG/ML IJ SOLN
INTRAMUSCULAR | Status: AC
  Filled 2022-07-13: qty 1

## 2022-07-13 MED ORDER — MEPERIDINE HCL 50 MG/ML IJ SOLN: 6.2500 mg | INTRAMUSCULAR | Status: DC | PRN

## 2022-07-13 MED ORDER — FENTANYL CITRATE PF 50 MCG/ML IJ SOSY
PREFILLED_SYRINGE | INTRAMUSCULAR | Status: AC
  Filled 2022-07-13: qty 1

## 2022-07-13 MED ORDER — ALBUMIN HUMAN 5 % IV SOLN
INTRAVENOUS | Status: AC
  Filled 2022-07-13: qty 250

## 2022-07-13 MED ORDER — ONDANSETRON HCL 4 MG/2ML IJ SOLN: 4.0000 mg | Freq: Once | INTRAMUSCULAR | Status: DC | PRN

## 2022-07-13 MED ORDER — MIDAZOLAM HCL 2 MG/2ML IJ SOLN
2.0000 mg | INTRAMUSCULAR | Status: AC
  Administered 2022-07-13: 2 mg via INTRAVENOUS

## 2022-07-13 MED ORDER — HYDROMORPHONE HCL 1 MG/ML IJ SOLN
INTRAMUSCULAR | Status: AC
  Filled 2022-07-13: qty 1

## 2022-07-13 MED ORDER — ENOXAPARIN SODIUM 40 MG/0.4ML IJ SOSY
40.0000 mg | PREFILLED_SYRINGE | INTRAMUSCULAR | Status: DC
  Administered 2022-07-14 – 2022-07-20 (×7): 40 mg via SUBCUTANEOUS
  Filled 2022-07-13 (×7): qty 0.4

## 2022-07-13 MED ORDER — KETAMINE HCL 10 MG/ML IJ SOLN
INTRAMUSCULAR | Status: DC | PRN
  Administered 2022-07-13: 20 mg via INTRAVENOUS
  Administered 2022-07-13: 30 mg via INTRAVENOUS

## 2022-07-13 MED ORDER — FENTANYL CITRATE PF 50 MCG/ML IJ SOSY
25.0000 ug | PREFILLED_SYRINGE | INTRAMUSCULAR | Status: DC | PRN
  Administered 2022-07-13 (×2): 50 ug via INTRAVENOUS

## 2022-07-13 MED ORDER — ACETAMINOPHEN 160 MG/5ML PO SOLN: 325.0000 mg | ORAL | Status: DC | PRN

## 2022-07-13 MED ORDER — LACTATED RINGERS IV SOLN: INTRAVENOUS | Status: DC

## 2022-07-13 MED ORDER — ACETAMINOPHEN 10 MG/ML IV SOLN
INTRAVENOUS | Status: AC
  Filled 2022-07-13: qty 100

## 2022-07-13 MED ORDER — 0.9 % SODIUM CHLORIDE (POUR BTL) OPTIME
TOPICAL | Status: DC | PRN
  Administered 2022-07-13: 2000 mL

## 2022-07-13 MED ORDER — SUGAMMADEX SODIUM 200 MG/2ML IV SOLN
INTRAVENOUS | Status: DC | PRN
  Administered 2022-07-13: 200 mg via INTRAVENOUS

## 2022-07-13 MED ORDER — OXYCODONE HCL 5 MG PO TABS: 5.0000 mg | ORAL_TABLET | Freq: Once | ORAL | Status: DC | PRN

## 2022-07-13 MED ORDER — MUPIROCIN 2 % EX OINT
1.0000 | TOPICAL_OINTMENT | Freq: Two times a day (BID) | CUTANEOUS | Status: DC
  Administered 2022-07-13: 1 via NASAL
  Filled 2022-07-13: qty 22

## 2022-07-13 MED ORDER — ACETAMINOPHEN 325 MG PO TABS: 325.0000 mg | ORAL_TABLET | ORAL | Status: DC | PRN

## 2022-07-13 MED ORDER — ONDANSETRON HCL 4 MG/2ML IJ SOLN
INTRAMUSCULAR | Status: AC
  Filled 2022-07-13: qty 2

## 2022-07-13 MED ORDER — ONDANSETRON HCL 4 MG/2ML IJ SOLN
INTRAMUSCULAR | Status: DC | PRN
  Administered 2022-07-13: 4 mg via INTRAVENOUS

## 2022-07-13 MED ORDER — HYDROMORPHONE HCL 2 MG/ML IJ SOLN
INTRAMUSCULAR | Status: AC
  Filled 2022-07-13: qty 1

## 2022-07-13 MED ORDER — LIDOCAINE HCL (PF) 2 % IJ SOLN
INTRAMUSCULAR | Status: AC
  Filled 2022-07-13: qty 5

## 2022-07-13 MED ORDER — OXYCODONE HCL 5 MG/5ML PO SOLN: 5.0000 mg | Freq: Once | ORAL | Status: DC | PRN

## 2022-07-13 MED ORDER — FENTANYL CITRATE PF 50 MCG/ML IJ SOSY
PREFILLED_SYRINGE | INTRAMUSCULAR | Status: AC
  Administered 2022-07-13: 50 ug via INTRAVENOUS
  Filled 2022-07-13: qty 2

## 2022-07-13 MED ORDER — ROCURONIUM BROMIDE 10 MG/ML (PF) SYRINGE
PREFILLED_SYRINGE | INTRAVENOUS | Status: DC | PRN
  Administered 2022-07-13: 60 mg via INTRAVENOUS
  Administered 2022-07-13: 40 mg via INTRAVENOUS
  Administered 2022-07-13: 30 mg via INTRAVENOUS

## 2022-07-13 MED ORDER — LACTATED RINGERS IV SOLN: INTRAVENOUS | Status: DC | PRN

## 2022-07-13 MED ORDER — DEXTROSE 5 % IV SOLN
2.0000 g | Freq: Once | INTRAVENOUS | Status: AC
  Administered 2022-07-13: 2 g via INTRAVENOUS
  Filled 2022-07-13: qty 2

## 2022-07-13 MED ORDER — ALBUMIN HUMAN 5 % IV SOLN: INTRAVENOUS | Status: DC | PRN

## 2022-07-13 MED ORDER — FENTANYL CITRATE (PF) 250 MCG/5ML IJ SOLN
INTRAMUSCULAR | Status: AC
  Filled 2022-07-13: qty 5

## 2022-07-13 MED ORDER — PROPOFOL 10 MG/ML IV BOLUS
INTRAVENOUS | Status: AC
  Filled 2022-07-13: qty 20

## 2022-07-13 MED ORDER — METHOCARBAMOL 500 MG IVPB - SIMPLE MED
500.0000 mg | Freq: Once | INTRAVENOUS | Status: AC
  Administered 2022-07-13: 500 mg via INTRAVENOUS

## 2022-07-13 MED ORDER — MIDAZOLAM HCL 2 MG/2ML IJ SOLN
INTRAMUSCULAR | Status: AC
  Filled 2022-07-13: qty 2

## 2022-07-13 MED ORDER — SODIUM CHLORIDE 0.9 % IR SOLN
Status: DC | PRN
  Administered 2022-07-13: 1000 mL

## 2022-07-13 MED ORDER — BUPIVACAINE-EPINEPHRINE (PF) 0.25% -1:200000 IJ SOLN
INTRAMUSCULAR | Status: AC
  Filled 2022-07-13: qty 30

## 2022-07-13 MED ORDER — LIDOCAINE HCL 2 % IJ SOLN
INTRAMUSCULAR | Status: AC
  Filled 2022-07-13: qty 20

## 2022-07-13 MED ORDER — PROPOFOL 10 MG/ML IV BOLUS
INTRAVENOUS | Status: DC | PRN
  Administered 2022-07-13: 120 mg via INTRAVENOUS

## 2022-07-13 MED ORDER — METHOCARBAMOL 500 MG IVPB - SIMPLE MED
INTRAVENOUS | Status: AC
  Filled 2022-07-13: qty 55

## 2022-07-13 MED ORDER — IOHEXOL 300 MG/ML  SOLN
INTRAMUSCULAR | Status: DC | PRN
  Administered 2022-07-13: 20 mL

## 2022-07-13 MED ORDER — DEXAMETHASONE SODIUM PHOSPHATE 10 MG/ML IJ SOLN
INTRAMUSCULAR | Status: DC | PRN
  Administered 2022-07-13: 10 mg via INTRAVENOUS

## 2022-07-13 MED ORDER — HEPARIN SODIUM (PORCINE) 5000 UNIT/ML IJ SOLN
INTRAMUSCULAR | Status: DC | PRN
  Administered 2022-07-13: 5000 [IU] via SUBCUTANEOUS

## 2022-07-13 SURGICAL SUPPLY — 79 items
APL SWBSTK 6 STRL LF DISP (MISCELLANEOUS) ×4
APPLICATOR COTTON TIP 6 STRL (MISCELLANEOUS) ×4 IMPLANT
APPLICATOR COTTON TIP 6IN STRL (MISCELLANEOUS) ×4 IMPLANT
BAG COUNTER SPONGE SURGICOUNT (BAG) IMPLANT
BAG SPNG CNTER NS LX DISP (BAG)
BAG URO CATCHER STRL LF (MISCELLANEOUS) ×2 IMPLANT
BLADE EXTENDED COATED 6.5IN (ELECTRODE) IMPLANT
BLADE HEX COATED 2.75 (ELECTRODE) ×2 IMPLANT
BLADE SURG SZ10 CARB STEEL (BLADE) IMPLANT
BRR ADH 5X3 SEPRAFILM 6 SHT (MISCELLANEOUS) ×2
CATH URETL OPEN 5X70 (CATHETERS) IMPLANT
CLIP TI LARGE 6 (CLIP) IMPLANT
CLOTH BEACON ORANGE TIMEOUT ST (SAFETY) ×2 IMPLANT
COVER MAYO STAND STRL (DRAPES) ×2 IMPLANT
DRAIN CHANNEL 10F 3/8 F FF (DRAIN) IMPLANT
DRAPE LAPAROSCOPIC ABDOMINAL (DRAPES) ×2 IMPLANT
DRAPE SHEET LG 3/4 BI-LAMINATE (DRAPES) IMPLANT
DRAPE WARM FLUID 44X44 (DRAPES) ×2 IMPLANT
DRSG TEGADERM 4X4.75 (GAUZE/BANDAGES/DRESSINGS) IMPLANT
ELECT PENCIL ROCKER SW 15FT (MISCELLANEOUS) IMPLANT
ELECT REM PT RETURN 15FT ADLT (MISCELLANEOUS) ×2 IMPLANT
EVACUATOR DRAINAGE 10X20 100CC (DRAIN) IMPLANT
EVACUATOR SILICONE 100CC (DRAIN) IMPLANT
GAUZE 4X4 16PLY ~~LOC~~+RFID DBL (SPONGE) IMPLANT
GAUZE PAD ABD 8X10 STRL (GAUZE/BANDAGES/DRESSINGS) IMPLANT
GAUZE SPONGE 4X4 12PLY STRL (GAUZE/BANDAGES/DRESSINGS) ×2 IMPLANT
GLOVE BIO SURGEON STRL SZ7.5 (GLOVE) ×2 IMPLANT
GLOVE BIOGEL M 7.0 STRL (GLOVE) ×2 IMPLANT
GLOVE BIOGEL PI IND STRL 7.0 (GLOVE) ×2 IMPLANT
GLOVE INDICATOR 8.0 STRL GRN (GLOVE) ×4 IMPLANT
GOWN STRL REUS W/ TWL XL LVL3 (GOWN DISPOSABLE) ×6 IMPLANT
GOWN STRL REUS W/TWL XL LVL3 (GOWN DISPOSABLE) ×6
GUIDEWIRE STR DUAL SENSOR (WIRE) ×2 IMPLANT
GUIDEWIRE ZIPWRE .038 STRAIGHT (WIRE) IMPLANT
HANDLE SUCTION POOLE (INSTRUMENTS) ×2 IMPLANT
KIT BASIN OR (CUSTOM PROCEDURE TRAY) ×2 IMPLANT
KIT TURNOVER KIT A (KITS) IMPLANT
LEGGING LITHOTOMY PAIR STRL (DRAPES) IMPLANT
LIGASURE IMPACT 36 18CM CVD LR (INSTRUMENTS) IMPLANT
MANIFOLD NEPTUNE II (INSTRUMENTS) ×2 IMPLANT
NS IRRIG 1000ML POUR BTL (IV SOLUTION) ×4 IMPLANT
PACK CYSTO (CUSTOM PROCEDURE TRAY) ×2 IMPLANT
PACK GENERAL/GYN (CUSTOM PROCEDURE TRAY) ×2 IMPLANT
PAD TELFA 2X3 NADH STRL (GAUZE/BANDAGES/DRESSINGS) IMPLANT
RELOAD PROXIMATE 75MM BLUE (ENDOMECHANICALS) ×2 IMPLANT
RELOAD STAPLE 75 3.8 BLU REG (ENDOMECHANICALS) IMPLANT
RTRCTR C-SECT PINK 25CM LRG (MISCELLANEOUS) IMPLANT
SEPRAFILM PROCEDURAL PACK 3X5 (MISCELLANEOUS) IMPLANT
SHEARS HARMONIC ACE PLUS 36CM (ENDOMECHANICALS) IMPLANT
STAPLER PROXIMATE 75MM BLUE (STAPLE) IMPLANT
STAPLER VISISTAT (STAPLE) IMPLANT
STAPLER VISISTAT 35W (STAPLE) ×2 IMPLANT
STENT POLARIS 5FRX24 (STENTS) IMPLANT
SUCTION POOLE HANDLE (INSTRUMENTS) ×2
SUT CHROMIC 0 SH (SUTURE) IMPLANT
SUT NOV 1 T60/GS (SUTURE) IMPLANT
SUT NOVA NAB DX-16 0-1 5-0 T12 (SUTURE) IMPLANT
SUT NOVA T20/GS 25 (SUTURE) IMPLANT
SUT PROLENE 2 0 SH DA (SUTURE) IMPLANT
SUT SILK 2 0 (SUTURE)
SUT SILK 2 0 SH CR/8 (SUTURE) IMPLANT
SUT SILK 2 0SH CR/8 30 (SUTURE) IMPLANT
SUT SILK 2-0 18XBRD TIE 12 (SUTURE) IMPLANT
SUT SILK 2-0 30XBRD TIE 12 (SUTURE) IMPLANT
SUT SILK 3 0 (SUTURE) ×2
SUT SILK 3 0 SH CR/8 (SUTURE) IMPLANT
SUT SILK 3-0 18XBRD TIE 12 (SUTURE) IMPLANT
SUT VIC AB 2-0 SH 18 (SUTURE) IMPLANT
SUT VIC AB 3-0 SH 18 (SUTURE) IMPLANT
SUT VICRYL 2 0 18  UND BR (SUTURE) ×4
SUT VICRYL 2 0 18 UND BR (SUTURE) ×4 IMPLANT
SYR 10ML LL (SYRINGE) ×2 IMPLANT
TOWEL OR 17X26 10 PK STRL BLUE (TOWEL DISPOSABLE) ×4 IMPLANT
TOWEL OR NON WOVEN STRL DISP B (DISPOSABLE) ×2 IMPLANT
TRAY FOLEY MTR SLVR 14FR STAT (SET/KITS/TRAYS/PACK) IMPLANT
TRAY FOLEY MTR SLVR 16FR STAT (SET/KITS/TRAYS/PACK) IMPLANT
TUBING CONNECTING 10 (TUBING) ×2 IMPLANT
TUBING UROLOGY SET (TUBING) IMPLANT
YANKAUER SUCT BULB TIP NO VENT (SUCTIONS) ×2 IMPLANT

## 2022-07-13 NOTE — Plan of Care (Signed)
  Problem: Education: Goal: Knowledge of General Education information will improve Description Including pain rating scale, medication(s)/side effects and non-pharmacologic comfort measures Outcome: Progressing   

## 2022-07-13 NOTE — Anesthesia Postprocedure Evaluation (Signed)
Anesthesia Post Note  Patient: Stephnie L Trevino  Procedure(s) Performed: exploratory laparatomy, right hemi-colectomy, and ileostomy creation, and lysis of adhesions (Right) CYSTOSCOPY WITH STENT REPLACEMENT AND BILATERAL RETROGRADE (Bilateral)     Patient location during evaluation: PACU Anesthesia Type: General Level of consciousness: awake and alert Pain management: pain level controlled Vital Signs Assessment: post-procedure vital signs reviewed and stable Respiratory status: spontaneous breathing, nonlabored ventilation, respiratory function stable and patient connected to nasal cannula oxygen Cardiovascular status: blood pressure returned to baseline and stable Postop Assessment: no apparent nausea or vomiting Anesthetic complications: no   No notable events documented.  Last Vitals:  Vitals:   07/13/22 1430 07/13/22 1445  BP: 109/65 103/66  Pulse: 85 73  Resp: 16 13  Temp:  (!) 36.2 C  SpO2: 100% 100%    Last Pain:  Vitals:   07/13/22 1445  TempSrc:   PainSc: 10-Worst pain ever                 Mitchelle Goerner S

## 2022-07-13 NOTE — Consult Note (Signed)
WOC Nurse ostomy consult note Consult received for new ostomy.  WOC Nursing will see on Monday, 9/18.  WOC nursing team will follow, and will remain available to this patient, the nursing and medical teams.    Thank you for inviting [place] to participate in this patient's Plan of Care.  Ladona Mow, MSN, RN, CNS, GNP, Leda Min, [employer], [employer] phone:  [phone]

## 2022-07-13 NOTE — Progress Notes (Signed)
Subjective No acute events. States she is ready for surgery. No n/v. At present, stable RLQ pain and discomfort.  Objective: Vital signs in last 24 hours: Temp:  [97.8 F (36.6 C)-98.8 F (37.1 C)] 97.8 F (36.6 C) (09/15 0750) Pulse Rate:  [77-94] 89 (09/15 0750) Resp:  [12-24] 18 (09/15 0750) BP: (106-130)/(63-87) 126/87 (09/15 0750) SpO2:  [98 %-100 %] 99 % (09/15 0750) Weight:  [55.8 kg] 55.8 kg (09/15 0814) Last BM Date : 07/08/22  Intake/Output from previous day: 09/14 0701 - 09/15 0700 In: 3840.8 [P.O.:480; I.V.:3155.3; IV Piggyback:205.6] Out: 805 [Urine:800; Drains:5] Intake/Output this shift: No intake/output data recorded.  Gen: NAD, comfortable CV: RRR Pulm: Normal work of breathing Abd: Soft, moderately ttp in RLQ; mildly distended Ext: SCDs in place  Lab Results: CBC  Recent Labs    07/11/22 1856 07/12/22 0529  WBC 9.6 7.6  HGB 9.9* 8.7*  HCT 30.5* 26.8*  PLT 307 276   BMET Recent Labs    07/12/22 0529 07/13/22 0428  NA 136 141  K 2.9* 3.3*  CL 107 109  CO2 23 29  GLUCOSE 80 65*  BUN 6 <5*  CREATININE 0.46 0.44  CALCIUM 7.9* 8.3*   PT/INR Recent Labs    07/11/22 1856  LABPROT 15.1  INR 1.2   ABG No results for input(s): "PHART", "HCO3" in the last 72 hours.  Invalid input(s): "PCO2", "PO2"  Studies/Results:  Anti-infectives: Anti-infectives (From admission, onward)    Start     Dose/Rate Route Frequency Ordered Stop   07/13/22 0900  sulfamethoxazole-trimethoprim (BACTRIM DS) 800-160 MG per tablet 1 tablet        1 tablet Oral Once per day on Mon Wed Fri 07/11/22 2257     07/12/22 0400  piperacillin-tazobactam (ZOSYN) IVPB 3.375 g        3.375 g 12.5 mL/hr over 240 Minutes Intravenous Every 8 hours 07/11/22 2026     07/11/22 2030  piperacillin-tazobactam (ZOSYN) IVPB 3.375 g        3.375 g 100 mL/hr over 30 Minutes Intravenous  Once 07/11/22 2026 07/11/22 2208        Assessment/Plan: Patient Active Problem List    Diagnosis Date Noted   Intra-abdominal abscess (HCC) 06/22/2022   Need for pneumocystis prophylaxis 06/22/2022   Malnutrition of moderate degree 06/13/2022   Right leg pain 06/12/2022   SBO (small bowel obstruction) (HCC) 06/12/2022   Hydroureteronephrosis 06/12/2022   Thrombocytosis 06/12/2022   Sepsis (HCC) 06/01/2022   Abnormal weight loss 05/28/2022   Gastroesophageal reflux disease 05/28/2022   Acute Crohn's disease (HCC) 05/03/2022   Iron deficiency anemia 04/24/2022   Normocytic anemia 04/23/2022   Hypotension 04/23/2022   Uncontrolled NIDDM-2 with hyperglycemia 04/23/2022   Crohn's disease of ileum, with abscess (HCC) 04/22/2022   Tobacco abuse 04/22/2022   Dehydration 01/09/2022   Fistula of intestine, 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   Abdominal pain, acute, right lower quadrant 01/08/2022   Hypokalemia 01/08/2022   Diarrhea 01/08/2022   NVD (normal vaginal delivery) 09/13/2015   Diabetes in undelivered pregnancy 08/30/2015   Insulin-requiring or dependent type II diabetes mellitus (HCC) 08/29/2015   [redacted] weeks gestation of pregnancy    Prior pregnancy with fetal demise and current pregnancy in second trimester    Encounter for fetal anatomic survey      LOS: 2 days   OR Today for exploratory laparotomy, ileocolic resection (right hemicolectomy), cysto/ureteral stents (urology), likely end ileostomy.  -We have discussed exploratory laparotomy with ileocolic resection,  possible additional bowel resection if involved; end ileostomy.  Concurrent cystoscopy/ureteral stents with urology. -We have discussed scenarios where ostomy my be temporary or even permanent, largely based on findings and recovery and how things are going. She expresses clear understanding -The planned procedure, material risks (including, but not limited to, pain, bleeding, infection, scarring, need for blood transfusion, damage to surrounding structures- blood vessels/nerves/viscus/organs, damage to  ureter, urine leak, leak from anastomosis, need for additional procedures, need for stoma which may be permanent, worsening of pre-existing medical conditions, hernia, recurrence, pneumonia, heart attack, stroke, death) benefits and alternatives to surgery were discussed at length.  She understands that her disease would not be cured with surgery but simply treated at present and that she will require ongoing treatment going forward as well.  The patient's questions were answered to her satisfaction, she voiced understanding and elected to proceed with surgery. Additionally, we discussed typical postoperative expectations and the recovery process.  I spent a total of 40 minutes in both face-to-face and non-face-to-face activities, excluding procedures performed, for this visit on the date of this encounter.  Check [email or web address] for General Surgery coverage night/weekend/holidays  No secure chat available for me given surgeries/clinic/off post call which would lead to a delay in care.    Marin Olp, MD [facility], [facility]

## 2022-07-13 NOTE — Anesthesia Procedure Notes (Signed)
Procedure Name: Intubation Date/Time: 07/13/2022 10:07 AM  Performed by: Maxwell Caul, CRNAPre-anesthesia Checklist: Patient identified, Emergency Drugs available, Suction available and Patient being monitored Patient Re-evaluated:Patient Re-evaluated prior to induction Oxygen Delivery Method: Circle system utilized Preoxygenation: Pre-oxygenation with 100% oxygen Induction Type: IV induction Ventilation: Mask ventilation without difficulty Laryngoscope Size: Mac and 4 Grade View: Grade I Tube type: Oral Tube size: 7.0 mm Number of attempts: 1 Airway Equipment and Method: Stylet Placement Confirmation: ETT inserted through vocal cords under direct vision, positive ETCO2 and breath sounds checked- equal and bilateral Secured at: 21 cm Tube secured with: Tape Dental Injury: Teeth and Oropharynx as per pre-operative assessment

## 2022-07-13 NOTE — Progress Notes (Signed)
MDA notified about pain management for patient.  In my care patient has received fentanyl and 1mg  Dilaudid and 1g IV Tylenol.  Pt still complains of 9/10 pain but RR is 10.  MDA will swing by to see patient but this RN plans to return the patient to her room and has given Eugenia report already.  Will continue to monitor and notify for further changes.

## 2022-07-13 NOTE — Op Note (Unsigned)
NAME: Sandra Foster, Sandra Foster. MEDICAL RECORD NO: [id number] ACCOUNT NO: [id number] DATE OF BIRTH: [birth year] FACILITY: [facility] LOCATION: [facility] PHYSICIAN: Sebastian Ache, MD  Operative Report   DATE OF PROCEDURE: 07/13/2022  PREOPERATIVE DIAGNOSIS:  Crohn's disease with significant pelvic inflammation, suspect bowel leak, request for ureteral marking.  PROCEDURE PERFORMED:   1.  Cystoscopy with bilateral retrograde pyelograms interpretation. 2.  Insertion of bilateral ureteral stents.  ESTIMATED BLOOD LOSS:  Nil.  COMPLICATIONS:  None.  SPECIMEN:  None.  FINDINGS:   1.  Mild left hydroureteronephrosis to the distal third of the ureter consistent with slight extrinsic compression from known pelvic inflammation. 2.  Unremarkable right retrograde pyelogram. 3.  Successful placement of bilateral ureteral stents, proximal end in the renal pelvis, distal end in the urinary bladder.  INDICATIONS:  The patient is a 31 year old lady with history of Crohn's disease, with exacerbation with a pelvic abscess.  She is status post drain placement.  There was feculent material from her drain.  She is undergoing exploratory laparotomy under the  care of the general surgery team today.  They requested perioperative stenting for ureteral protection and marking. Her imaging was reviewed.  This was felt to be prudent.  She does have some slightly worsened left caliectasis with left hydronephrosis  to the distal ureter and it was felt that most prudent means of management for colon surgery, placement of an internalized ureteral stents and she wished to proceed.  Informed consent was obtained and placed in medical record.  PROCEDURE IN DETAIL:  The patient is being identified and verified, procedure being cystoscopy, bilateral stent placement confirmed.  Procedure timeout was performed.  Intravenous antibiotics were administered.  General endotracheal anesthesia was  introduced.  The patient was placed into a low  lithotomy position.  Sterile field was created, prepped and draped the patient's vagina, introitus, and proximal thighs using iodine.  Cystourethroscopy was performed using 21-French rigid cystoscope with  offset lens.  Inspection of urinary bladder revealed no diverticula, calcifications, papillary lesions.  The left ureteral orifice was cannulated with a 6-French end-hole catheter, and a left retrograde pyelogram was obtained.  Left retrograde pyelogram demonstrated a single left ureter, single system left kidney.  There was mild hydronephrosis to distal third of the ureter consistent with some likely mild extrinsic compression from her known severe pelvic inflammation.  A  ZIPwire was advanced to the level of the mid pole and a new 5 x 24 Polaris type stent was placed using fluoroscopic guidance with good proximal and distal planes were noted.  Next, right retrograde pyelogram was obtained.  Right retrograde pyelogram demonstrated single right ureter, single system right kidney.  No filling defects or narrowing noted. Mild caliectasis that appears chronic.  A separate ZIPwire was advanced, over which a new 5 x 24 Polaris type stent was  placed using fluoroscopic guidance.  Good proximal and distal plane were noted.  A Foley catheter was placed free to straight drain, 10 mL sterile water in the balloon.  Procedure was terminated.  The patient tolerated the procedure well, no immediate  periprocedural complications.  The patient was then turned over to General Surgery team for her intraabdominal portion of the surgery today.   PUS D: 07/13/2022 11:57:44 am T: 07/13/2022 3:16:00 pm  JOB: [id number]/ [id number]

## 2022-07-13 NOTE — Transfer of Care (Signed)
Immediate Anesthesia Transfer of Care Note  Patient: Sandra Foster  Procedure(s) Performed: exploratory laparatomy, right hemi-colectomy, and ileostomy creation, and lysis of adhesions (Right) CYSTOSCOPY WITH STENT REPLACEMENT AND BILATERAL RETROGRADE (Bilateral)  Patient Location: PACU  Anesthesia Type:General  Level of Consciousness: awake, alert  and oriented  Airway & Oxygen Therapy: Patient Spontanous Breathing and Patient connected to face mask oxygen  Post-op Assessment: Report given to RN and Post -op Vital signs reviewed and stable  Post vital signs: Reviewed and stable  Last Vitals:  Vitals Value Taken Time  BP 104/73 07/13/22 1345  Temp    Pulse 81 07/13/22 1347  Resp 15 07/13/22 1347  SpO2 100 % 07/13/22 1347  Vitals shown include unvalidated device data.  Last Pain:  Vitals:   07/13/22 0750  TempSrc: Oral  PainSc:       Patients Stated Pain Goal: 2 (07/12/22 1857)  Complications: No notable events documented.

## 2022-07-13 NOTE — Anesthesia Preprocedure Evaluation (Addendum)
Anesthesia Evaluation  Patient identified by MRN, date of birth, ID band Patient awake    Reviewed: Allergy & Precautions, NPO status , Patient's Chart, lab work & pertinent test results  Airway Mallampati: I  TM Distance: >3 FB Neck ROM: Full    Dental no notable dental hx. (+) Teeth Intact, Dental Advisory Given   Pulmonary neg pulmonary ROS, Patient abstained from smoking., former smoker,    Pulmonary exam normal breath sounds clear to auscultation       Cardiovascular hypertension, Pt. on medications Normal cardiovascular exam Rhythm:Regular Rate:Normal     Neuro/Psych negative neurological ROS  negative psych ROS   GI/Hepatic Neg liver ROS, GERD  Medicated and Controlled,  Endo/Other  diabetes, Type 2  Renal/GU Renal disease  negative genitourinary   Musculoskeletal negative musculoskeletal ROS (+)   Abdominal   Peds negative pediatric ROS (+)  Hematology negative hematology ROS (+) Blood dyscrasia, anemia ,   Anesthesia Other Findings   Reproductive/Obstetrics negative OB ROS                            Anesthesia Physical Anesthesia Plan  ASA: 3  Anesthesia Plan: General   Post-op Pain Management: Minimal or no pain anticipated, Tylenol PO (pre-op)* and Dilaudid IV   Induction: Intravenous  PONV Risk Score and Plan: 3 and Ondansetron, Dexamethasone and Treatment may vary due to age or medical condition  Airway Management Planned: Oral ETT  Additional Equipment: None  Intra-op Plan:   Post-operative Plan: Extubation in OR  Informed Consent:   Plan Discussed with: Anesthesiologist  Anesthesia Plan Comments: (HPI: Sandra Foster is a 31 y.o. female with medical history significant of IDDM, Crohn's disease.  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  This has progressively worsened and she has required 4 prior  admits (5 including today) since June 2023.  She has developed abscess as well as inflammation causing extrensic compression of ureters and B hydronephrosis.)       Anesthesia Quick Evaluation

## 2022-07-13 NOTE — Addendum Note (Signed)
Addendum  created 07/13/22 1516 by Lewie Loron, MD   Order list changed, Pharmacy for encounter modified

## 2022-07-13 NOTE — Op Note (Signed)
07/13/2022  1:44 PM  PATIENT:  Sandra Foster  31 y.o. female  Patient Care Team: Quita Skye, PA-C as PCP - General (Physician Assistant) Charna Elizabeth, MD as Consulting Physician (Gastroenterology) Kathreen Cosier, MD as Referring Physician (Obstetrics and Gynecology) Quita Skye, PA-C (Physician Assistant)  PRE-OPERATIVE DIAGNOSIS: Perforating ileocolic Crohn's disease  POST-OPERATIVE DIAGNOSIS:  Same  PROCEDURE:   Exploratory laparotomy with right hemicolectomy End ileostomy Lysis of adhesions x 90 minutes  SURGEON:  Stephanie Coup. Cliffton Asters, MD  ASSISTANT: Leary Roca PA-C  ANESTHESIA:   general  COUNTS:  Sponge, needle and instrument counts were reported correct x2 at the conclusion of the operation.  EBL: 200 mL  DRAINS: None  SPECIMEN: Distal ileum, cecum appendix and ascending colon  COMPLICATIONS: None  FINDINGS: She noted perforating ileocolic Crohn's with mesenteric abscess between her terminal ileum and cecum.  Very dense inflammatory tissue extending up onto her ascending colon.  Adhesions were lysed between neighboring but uninvolved small bowel mesentery, sigmoid mesentery, omentum.  Distal ileum and cecum with dense inflammatory rind within the mesentery.  Going back to where the ileum is healthy in appearance, she has 355 cm of remaining small bowel remaining. Colon around hepatic flexure tagged with 2-0 prolene.  End ileostomy fashioned  DISPOSITION: PACU in satisfactory condition  DESCRIPTION: The patient was identified in preop holding and taken to the OR where she was placed on the operating room table. SCDs were placed.  Hair on the abdomen was clipped.  General endotracheal anesthesia was induced without difficulty.  She was then prepped and draped in the standard sterile fashion for the urologic portion of the procedure.  Please refer to Dr. Emmaline Life note for details regarding this portion of the procedure. She was then prepped and draped in  the usual sterile fashion. A surgical timeout was performed indicating the correct patient, procedure, positioning and need for preoperative antibiotics.   We began with a periumbilical midline incision.  The skin is incised.  This cutaneous tissue is incised electrocautery.  The fascia is incised in the midline.  The peritoneal cavity was entered.  She does have some ascites lower in volume but consistent with her hypoalbuminemic state.  It is clear in color.  A large Alexis wound protector was placed.  She was positioned in Trendelenburg.  A Bookwalter retractor was placed.  We able to reflect the majority of her small bowel and her upper abdomen.  There is a dense inflammatory phlegmon in the right lower quadrant.  We began by lysing adhesions of omentum and small bowel mesentery from the inflammatory phlegmon.  She also had sigmoid mesocolon that was somewhat pulled into this but not involving the actual colon.  This was lysed.  Additional adhesions of the small bowel mesentery were lysed along her cecum.  After doing all of this, were able to visualize all of the diseased bowel.  Her distal ileum is grossly abnormal with a equally abnormal appearing mesentery which is clearly not salvageable.  We then began by running her entire small bowel beginning at the ligament of Treitz and working her way back to the diseased segment.  There is no strictures, creeping fat, or any other gross evidence of Crohn's disease on her small bowel.  This was run twice and measured.  It was found to be 355 cm from her ligament of Treitz to the location where we plan to divide her small bowel.  The colon is evaluated.  The transverse colon and hepatic flexure  all grossly normal in appearance.  The descending and sigmoid are normal in appearance.  The intraperitoneal rectum was normal in appearance.  A window was created the mesentery at the level of the junction between normal and abnormal small bowel which is in her  distal ileum.  The distal point of transection is identified on her distal ascending colon near the hepatic flexure.  We began by mobilizing the ascending colon by incising the Addalynne Golding line of Toldt.  The associate mesocolon was reflected medially.  We were able to identify the duodenum.  It is well away from the inflammatory phlegmon in her right lower quadrant.  She does have a retrocecal appendix.  We are able to dissect this free from surrounding tissue and out of the retroperitoneum carefully.  The right ureter is palpated with the stent in place deep in the retroperitoneum and we are able to stay away from this.  A window was then created in the mesentery at the level of the planned division on her ascending colon.  A linear cutting GIA 75 blue load stapler was used to divide both the small bowel and colon at the respective locations.  The staple lines were inspected, hemostatic, and intact.  The intervening mesentery was then ligated by hugging the associated resected bowel using a LigaSure device.  The cut edge of the mesentery is inspected and noted to be hemostatic.  The specimen is then passed off.  Of note, the drain was intentionally cut and left in situ within the phlegmonous process for pathologic evaluation.  The abdomen is then irrigated with saline.  Hemostasis is verified.  We then passed off all equipment.  All gowns and gloves were changed by all scrubbed participants.  Additional sterile drapes were placed around the field.  We directed our attention at the ileostomy.  In her right hemiabdomen, overlying the rectus muscle, the skin is incised.  The associated subcutaneous tissues divided.  The rectus fascia is incised anteriorly and the rectus muscle spread.  The posterior sheath is then incised under direct visualization.  This is dilated to approximately 1-1/2 fingerbreadths.  The ileum was then passed through this.  We then directed our attention abdominal closure.  We proceeded by  placing 6 pieces of Seprafilm within the abdomen including 2 pieces in her pelvis in an effort to reduce the risk of adhesions around her tubes and ovaries.  The midline fascia was then closed using 2 running #1 PDS sutures.  All sponge, needle, and instrument counts were reported correct.  The skin was then closed with staples.  A sterile dressing was placed over the skin consisting of 4 x 4's and Tegaderm.  Attention was then directed at maturing the ileostomy in a brooking manner.  This was done using 3-0 Vicryl suture.  The ostomy is patent, pink in color and well-perfused.  An ostomy appliance is then cut to fit.  She is then awakened from anesthesia, extubated, and transferred to a stretcher for transport to PACU in satisfactory condition.

## 2022-07-13 NOTE — Consult Note (Signed)
Reason for Consult: Chrons' Disease with Severe Pelvic Inflammation / Request for Ureteral Stenting  Referring Physician: Angelena Form MD  Sandra Foster is an 31 y.o. female.   HPI:   1 - Chrons' Disease with Severe Pelvic Inflammation / Request for Ureteral Stenting - pt with known Chron;s with clinical bowel perforation undergoing open ex-lap and likely bowel resection by general surgery team today reqeusts ureteral stenting. CT 06/2022 with severe pelvic inflammation and some mild left hydro to pelvis and some L>R caliectaiss (chornic). Cr 0.4   Past Medical History:  Diagnosis Date   Chlamydia    Crohn's disease (HCC)    Diabetes mellitus without complication (HCC)    Fetal demise    hyperglycemic hyperosmolar nonketonic syndrome   Hernia    Pregnancy induced hypertension    Pyelonephritis     Past Surgical History:  Procedure Laterality Date   BIOPSY  01/09/2022   Procedure: BIOPSY;  Surgeon: Jeani Hawking, MD;  Location: [facility] ENDOSCOPY;  Service: Gastroenterology;;   COLONOSCOPY WITH PROPOFOL N/A 01/09/2022   Procedure: COLONOSCOPY WITH PROPOFOL;  Surgeon: Jeani Hawking, MD;  Location: [facility] ENDOSCOPY;  Service: Gastroenterology;  Laterality: N/A;   HERNIA REPAIR     IR RADIOLOGIST EVAL & MGMT  06/28/2022    Family History  Problem Relation Age of Onset   Hypertension Maternal Grandmother     Social History:  reports that she has quit smoking. Her smoking use included cigarettes. She smoked an average of .25 packs per day. She quit smokeless tobacco use about 11 years ago. She reports that she does not drink alcohol and does not use drugs.  Allergies:  Allergies  Allergen Reactions   Nsaids Other (See Comments)    Crohn's disease = NO NSAIDs    Medications: I have reviewed the patient's current medications.  Results for orders placed or performed during the hospital encounter of 07/11/22 (from the past 48 hour(s))  Comprehensive metabolic panel     Status: Abnormal    Collection Time: 07/11/22  6:56 PM  Result Value Ref Range   Sodium 133 (L) 135 - 145 mmol/L   Potassium 2.9 (L) 3.5 - 5.1 mmol/L   Chloride 101 98 - 111 mmol/L   CO2 23 22 - 32 mmol/L   Glucose, Bld 128 (H) 70 - 99 mg/dL    Comment: Glucose reference range applies only to samples taken after fasting for at least 8 hours.   BUN 9 6 - 20 mg/dL   Creatinine, Ser 4.16 0.44 - 1.00 mg/dL   Calcium 8.2 (L) 8.9 - 10.3 mg/dL   Total Protein 6.2 (L) 6.5 - 8.1 g/dL   Albumin 2.8 (L) 3.5 - 5.0 g/dL   AST 9 (L) 15 - 41 U/L   ALT 9 0 - 44 U/L   Alkaline Phosphatase 58 38 - 126 U/L   Total Bilirubin 0.4 0.3 - 1.2 mg/dL   GFR, Estimated >60 >63 mL/min    Comment: (NOTE) Calculated using the CKD-EPI Creatinine Equation (2021)    Anion gap 9 5 - 15    Comment: Performed at [facility], [address] [place], [place] [address]  Lactic acid, plasma     Status: None   Collection Time: 07/11/22  6:56 PM  Result Value Ref Range   Lactic Acid, Venous 1.0 0.5 - 1.9 mmol/L    Comment: Performed at [facility], [address], [place], [place] [address]  CBC with Differential  Status: Abnormal   Collection Time: 07/11/22  6:56 PM  Result Value Ref Range   WBC 9.6 4.0 - 10.5 K/uL   RBC 3.65 (L) 3.87 - 5.11 MIL/uL   Hemoglobin 9.9 (L) 12.0 - 15.0 g/dL   HCT 72.5 (L) 36.6 - 44.0 %   MCV 83.6 80.0 - 100.0 fL   MCH 27.1 26.0 - 34.0 pg   MCHC 32.5 30.0 - 36.0 g/dL   RDW 34.7 (H) 42.5 - 95.6 %   Platelets 307 150 - 400 K/uL   nRBC 0.0 0.0 - 0.2 %   Neutrophils Relative % 71 %   Neutro Abs 6.7 1.7 - 7.7 K/uL   Lymphocytes Relative 16 %   Lymphs Abs 1.5 0.7 - 4.0 K/uL   Monocytes Relative 11 %   Monocytes Absolute 1.1 (H) 0.1 - 1.0 K/uL   Eosinophils Relative 2 %   Eosinophils Absolute 0.2 0.0 - 0.5 K/uL   Basophils Relative 0 %   Basophils Absolute 0.0 0.0 - 0.1 K/uL   Immature Granulocytes 0 %   Abs Immature Granulocytes 0.02 0.00 - 0.07 K/uL     Comment: Performed at [facility], [address], [place], [place] [address]  Protime-INR     Status: None   Collection Time: 07/11/22  6:56 PM  Result Value Ref Range   Prothrombin Time 15.1 11.4 - 15.2 seconds   INR 1.2 0.8 - 1.2    Comment: (NOTE) INR goal varies based on device and disease states. Performed at [facility], [address] [place], [place] [address]   Resp Panel by RT-PCR (Flu A&B, Covid)     Status: None   Collection Time: 07/11/22  6:56 PM   Specimen: Nasal Swab  Result Value Ref Range   SARS Coronavirus 2 by RT PCR NEGATIVE NEGATIVE    Comment: (NOTE) SARS-CoV-2 target nucleic acids are NOT DETECTED.  The SARS-CoV-2 RNA is generally detectable in upper respiratory specimens during the acute phase of infection. The lowest concentration of SARS-CoV-2 viral copies this assay can detect is 138 copies/mL. A negative result does not preclude SARS-Cov-2 infection and should not be used as the sole basis for treatment or other patient management decisions. A negative result may occur with  improper specimen collection/handling, submission of specimen other than nasopharyngeal swab, presence of viral mutation(s) within the areas targeted by this assay, and inadequate number of viral copies(<138 copies/mL). A negative result must be combined with clinical observations, patient history, and epidemiological information. The expected result is Negative.  Fact Sheet for Patients:  BloggerCourse.com  Fact Sheet for Healthcare Providers:  SeriousBroker.it  This test is no t yet approved or cleared by the [place] FDA and  has been authorized for detection and/or diagnosis of SARS-CoV-2 by FDA under an Emergency Use Authorization (EUA). This EUA will remain  in effect (meaning this test can be used) for the duration of the COVID-19 declaration under Section 564(b)(1) of the  Act, 21 U.S.C.section 360bbb-3(b)(1), unless the authorization is terminated  or revoked sooner.       Influenza A by PCR NEGATIVE NEGATIVE   Influenza B by PCR NEGATIVE NEGATIVE    Comment: (NOTE) The Xpert Xpress SARS-CoV-2/FLU/RSV plus assay is intended as an aid in the diagnosis of influenza from Nasopharyngeal swab specimens and should not be used as a sole basis for treatment. Nasal washings and aspirates are unacceptable for Xpert Xpress SARS-CoV-2/FLU/RSV testing.  Fact Sheet for Patients: BloggerCourse.com  Fact Sheet for Healthcare Providers: SeriousBroker.it  This test is not yet approved or cleared by the [place] FDA and has been authorized for detection and/or diagnosis of SARS-CoV-2 by FDA under an Emergency Use Authorization (EUA). This EUA will remain in effect (meaning this test can be used) for the duration of the COVID-19 declaration under Section 564(b)(1) of the Act, 21 U.S.C. section 360bbb-3(b)(1), unless the authorization is terminated or revoked.  Performed at [facility], [address], [place], [place] [address]   APTT     Status: Abnormal   Collection Time: 07/11/22  6:56 PM  Result Value Ref Range   aPTT 38 (H) 24 - 36 seconds    Comment:        IF BASELINE aPTT IS ELEVATED, SUGGEST PATIENT RISK ASSESSMENT BE USED TO DETERMINE APPROPRIATE ANTICOAGULANT THERAPY. Performed at [facility], [address] [place], [place] [address]   Magnesium     Status: None   Collection Time: 07/11/22  6:56 PM  Result Value Ref Range   Magnesium 1.8 1.7 - 2.4 mg/dL    Comment: Performed at [facility], [address], [place], [place] [address]  Culture, blood (Routine x 2)     Status: None (Preliminary result)   Collection Time: 07/11/22  6:58 PM   Specimen: BLOOD  Result Value Ref Range   Specimen Description      BLOOD BLOOD LEFT  HAND Performed at [facility], [address] [place], [place] [address]    Special Requests      BOTTLES DRAWN AEROBIC AND ANAEROBIC Blood Culture adequate volume Performed at [facility], [address] [place], [place] [address]    Culture      NO GROWTH 2 DAYS Performed at [facility], [address] [place], [place] [address]    Report Status PENDING   Urinalysis, Routine w reflex microscopic     Status: Abnormal   Collection Time: 07/11/22  6:58 PM  Result Value Ref Range   Color, Urine YELLOW YELLOW   APPearance HAZY (A) CLEAR   Specific Gravity, Urine 1.020 1.005 - 1.030   pH 6.0 5.0 - 8.0   Glucose, UA NEGATIVE NEGATIVE mg/dL   Hgb urine dipstick NEGATIVE NEGATIVE   Bilirubin Urine NEGATIVE NEGATIVE   Ketones, ur NEGATIVE NEGATIVE mg/dL   Protein, ur NEGATIVE NEGATIVE mg/dL   Nitrite NEGATIVE NEGATIVE   Leukocytes,Ua TRACE (A) NEGATIVE   RBC / HPF 0-5 0 - 5 RBC/hpf   WBC, UA 6-10 0 - 5 WBC/hpf   Bacteria, UA NONE SEEN NONE SEEN   Squamous Epithelial / LPF 11-20 0 - 5   Mucus PRESENT     Comment: Performed at [facility], [address] [place], [place] [address]  Urine Culture     Status: Abnormal   Collection Time: 07/11/22  6:58 PM   Specimen: In/Out Cath Urine  Result Value Ref Range   Specimen Description      IN/OUT CATH URINE Performed at [facility], [address] [place], [place] [address]    Special Requests      NONE Performed at [facility], [address] [place], [place] [address]    Culture (A)     10,000 COLONIES/mL LACTOBACILLUS SPECIES Standardized susceptibility testing for this organism is not available. Performed at [facility], [address] [place], [place] [address]  Report Status 07/12/2022 FINAL   I-Stat beta hCG blood, ED     Status: None   Collection Time: 07/11/22  7:00 PM  Result Value Ref Range   I-stat  hCG, quantitative <5.0 <5 mIU/mL   Comment 3            Comment:   GEST. AGE      CONC.  (mIU/mL)   <=1 WEEK        5 - 50     2 WEEKS       50 - 500     3 WEEKS       100 - 10,000     4 WEEKS     1,000 - 30,000        FEMALE AND NON-PREGNANT FEMALE:     LESS THAN 5 mIU/mL   Culture, blood (Routine x 2)     Status: None (Preliminary result)   Collection Time: 07/11/22  9:15 PM   Specimen: BLOOD  Result Value Ref Range   Specimen Description      BLOOD SITE NOT SPECIFIED Performed at [facility], [address], [place], [place] [address]    Special Requests      BLOOD Blood Culture results may not be optimal due to an excessive volume of blood received in culture bottles Performed at [facility], [address], [place], [place] [address]    Culture      NO GROWTH 2 DAYS Performed at [facility], [address] [place], [place] [address]    Report Status PENDING   CBG monitoring, ED     Status: None   Collection Time: 07/11/22 10:23 PM  Result Value Ref Range   Glucose-Capillary 86 70 - 99 mg/dL    Comment: Glucose reference range applies only to samples taken after fasting for at least 8 hours.  CBG monitoring, ED     Status: None   Collection Time: 07/12/22 12:16 AM  Result Value Ref Range   Glucose-Capillary 85 70 - 99 mg/dL    Comment: Glucose reference range applies only to samples taken after fasting for at least 8 hours.  CBG monitoring, ED     Status: None   Collection Time: 07/12/22  3:58 AM  Result Value Ref Range   Glucose-Capillary 85 70 - 99 mg/dL    Comment: Glucose reference range applies only to samples taken after fasting for at least 8 hours.  CBG monitoring, ED     Status: None   Collection Time: 07/12/22  5:26 AM  Result Value Ref Range   Glucose-Capillary 86 70 - 99 mg/dL    Comment: Glucose reference range applies only to samples taken after fasting for at least 8 hours.  CBC     Status: Abnormal    Collection Time: 07/12/22  5:29 AM  Result Value Ref Range   WBC 7.6 4.0 - 10.5 K/uL   RBC 3.17 (L) 3.87 - 5.11 MIL/uL   Hemoglobin 8.7 (L) 12.0 - 15.0 g/dL   HCT 91.4 (L) 78.2 - 95.6 %   MCV 84.5 80.0 - 100.0 fL   MCH 27.4 26.0 - 34.0 pg   MCHC 32.5 30.0 - 36.0 g/dL   RDW 21.3 (H) 08.6 - 57.8 %   Platelets 276 150 - 400 K/uL   nRBC 0.0 0.0 - 0.2 %    Comment: Performed at [facility], [address] [place], [place] [address]  Basic metabolic  panel     Status: Abnormal   Collection Time: 07/12/22  5:29 AM  Result Value Ref Range   Sodium 136 135 - 145 mmol/L   Potassium 2.9 (L) 3.5 - 5.1 mmol/L   Chloride 107 98 - 111 mmol/L   CO2 23 22 - 32 mmol/L   Glucose, Bld 80 70 - 99 mg/dL    Comment: Glucose reference range applies only to samples taken after fasting for at least 8 hours.   BUN 6 6 - 20 mg/dL   Creatinine, Ser 1.61 0.44 - 1.00 mg/dL   Calcium 7.9 (L) 8.9 - 10.3 mg/dL   GFR, Estimated >09 >60 mL/min    Comment: (NOTE) Calculated using the CKD-EPI Creatinine Equation (2021)    Anion gap 6 5 - 15    Comment: Performed at [facility], [address] [place], [place] [address]  CBG monitoring, ED     Status: None   Collection Time: 07/12/22  8:08 AM  Result Value Ref Range   Glucose-Capillary 73 70 - 99 mg/dL    Comment: Glucose reference range applies only to samples taken after fasting for at least 8 hours.  CBG monitoring, ED     Status: Abnormal   Collection Time: 07/12/22 12:31 PM  Result Value Ref Range   Glucose-Capillary 63 (L) 70 - 99 mg/dL    Comment: Glucose reference range applies only to samples taken after fasting for at least 8 hours.  CBG monitoring, ED     Status: Abnormal   Collection Time: 07/12/22  1:42 PM  Result Value Ref Range   Glucose-Capillary 133 (H) 70 - 99 mg/dL    Comment: Glucose reference range applies only to samples taken after fasting for at least 8 hours.  Prealbumin     Status: Abnormal    Collection Time: 07/12/22  2:36 PM  Result Value Ref Range   Prealbumin 8 (L) 18 - 38 mg/dL    Comment: Performed at [facility], [address] [place], [place] [address]  Glucose, capillary     Status: Abnormal   Collection Time: 07/12/22  4:02 PM  Result Value Ref Range   Glucose-Capillary 101 (H) 70 - 99 mg/dL    Comment: Glucose reference range applies only to samples taken after fasting for at least 8 hours.  Glucose, capillary     Status: Abnormal   Collection Time: 07/12/22  8:36 PM  Result Value Ref Range   Glucose-Capillary 102 (H) 70 - 99 mg/dL    Comment: Glucose reference range applies only to samples taken after fasting for at least 8 hours.  Glucose, capillary     Status: None   Collection Time: 07/13/22 12:41 AM  Result Value Ref Range   Glucose-Capillary 95 70 - 99 mg/dL    Comment: Glucose reference range applies only to samples taken after fasting for at least 8 hours.  Basic metabolic panel     Status: Abnormal   Collection Time: 07/13/22  4:28 AM  Result Value Ref Range   Sodium 141 135 - 145 mmol/L   Potassium 3.3 (L) 3.5 - 5.1 mmol/L   Chloride 109 98 - 111 mmol/L   CO2 29 22 - 32 mmol/L   Glucose, Bld 65 (L) 70 - 99 mg/dL    Comment: Glucose reference range applies only to samples taken after fasting for at least 8 hours.   BUN <5 (L) 6 - 20 mg/dL   Creatinine, Ser 4.78 0.44 -  1.00 mg/dL   Calcium 8.3 (L) 8.9 - 10.3 mg/dL   GFR, Estimated >67 >61 mL/min    Comment: (NOTE) Calculated using the CKD-EPI Creatinine Equation (2021)    Anion gap 3 (L) 5 - 15    Comment: Performed at [facility], [address], [place], [place] [address]  Glucose, capillary     Status: None   Collection Time: 07/13/22  4:54 AM  Result Value Ref Range   Glucose-Capillary 80 70 - 99 mg/dL    Comment: Glucose reference range applies only to samples taken after fasting for at least 8 hours.  Surgical PCR screen     Status: None   Collection Time:  07/13/22  7:18 AM   Specimen: Nasal Mucosa; Nasal Swab  Result Value Ref Range   MRSA, PCR NEGATIVE NEGATIVE   Staphylococcus aureus NEGATIVE NEGATIVE    Comment: (NOTE) The Xpert SA Assay (FDA approved for NASAL specimens in patients 9 years of age and older), is one component of a comprehensive surveillance program. It is not intended to diagnose infection nor to guide or monitor treatment. Performed at [facility], [address], [place], [place] [address]   Glucose, capillary     Status: None   Collection Time: 07/13/22  7:49 AM  Result Value Ref Range   Glucose-Capillary 89 70 - 99 mg/dL    Comment: Glucose reference range applies only to samples taken after fasting for at least 8 hours.  Type and screen [facility]     Status: None   Collection Time: 07/13/22  8:05 AM  Result Value Ref Range   ABO/RH(D) O POS    Antibody Screen NEG    Sample Expiration      07/16/2022,2359 Performed at [facility], [address], [place], [place] [address]     DG Chest 2 View  Result Date: 07/11/2022 CLINICAL DATA:  Fever; suspected sepsis EXAM: CHEST - 2 VIEW COMPARISON:  Radiographs 06/11/2022 FINDINGS: No focal consolidation, pleural effusion, or pneumothorax. Normal cardiomediastinal silhouette. No acute osseous abnormality. IMPRESSION: No active cardiopulmonary disease. Electronically Signed   By: Minerva Fester M.D.   On: 07/11/2022 20:04   CT Abdomen Pelvis W Contrast  Result Date: 07/11/2022 CLINICAL DATA:  Abdomen pain fever EXAM: CT ABDOMEN AND PELVIS WITH CONTRAST TECHNIQUE: Multidetector CT imaging of the abdomen and pelvis was performed using the standard protocol following bolus administration of intravenous contrast. RADIATION DOSE REDUCTION: This exam was performed according to the departmental dose-optimization program which includes automated exposure control, adjustment of the mA and/or kV according to patient  size and/or use of iterative reconstruction technique. CONTRAST:  OMNIPAQUE IOHEXOL 300 MG/ML  SOLN COMPARISON:  CT 06/28/2022, 06/11/2022, 05/03/2022 FINDINGS: Lower chest: Lung bases demonstrate no acute airspace disease. Hepatobiliary: No focal liver abnormality is seen. No gallstones, gallbladder wall thickening, or biliary dilatation. Pancreas: Unremarkable. No pancreatic ductal dilatation or surrounding inflammatory changes. Spleen: Normal in size without focal abnormality. Adrenals/Urinary Tract: Adrenal glands are within normal limits. Prominent right extrarenal pelvis, mildly increased compared to prior. Moderate dilatation of left extrarenal pelvis and ureter, increased compared to most recent prior. Urinary bladder is unremarkable. Stomach/Bowel: The stomach is nondistended. Abnormal thickened small bowel loops with mucosal enhancement in the right lower quadrant worse as compared with 06/28/2022. wall thickening and inflammation at the cecum. Vascular/Lymphatic: Nonaneurysmal aorta. No increased adenopathy. Retroaortic left renal vein. Reproductive: Uterus and bilateral adnexa are unremarkable. Other: Right pelvic  drainage catheter remains in place with small gas and fluid collection surrounding the pigtail. Interval irregular gas and fluid collection in the right lower quadrant measuring about 2.9 x 1.9 cm, series 2, image 50. Increased mildly loculated appearing pelvic effusions. No internal gas bubbles. Musculoskeletal: No acute or significant osseous findings. IMPRESSION: 1. Considerable inflammatory process in the right lower quadrant mostly involving distal small bowel, suspect for active inflammation/Crohn's. Inflammatory changes appear worse as compared with the CT from 06/28/2022. Right lower quadrant drainage catheter remains in place with small residual gas and fluid collection around the pigtail. Interim finding of small irregular gas and fluid collection in the right lower quadrant  measuring up to 2.9 cm, this may reflect recurrent abscess or small area of contained perforation. Increased mildly loculated fluid within the pelvis. 2. Increased left hydronephrosis and hydroureter without obstructing stone. Slight increased dilatation of right extrarenal pelvis. Slightly thick-walled appearance of the urinary bladder, possible cystitis. Electronically Signed   By: Jasmine Pang M.D.   On: 07/11/2022 20:03    Review of Systems  Constitutional:  Negative for chills and fever.  Gastrointestinal:  Positive for abdominal distention and abdominal pain.   Blood pressure 126/87, pulse 89, temperature 97.8 F (36.6 C), temperature source Oral, resp. rate 18, height  (1.676 m), weight 55.8 kg, SpO2 99 %. Physical Exam Vitals reviewed.  Constitutional:      Comments: Pleasant, AOx3 in OR holding.   Cardiovascular:     Rate and Rhythm: Normal rate.  Pulmonary:     Effort: Pulmonary effort is normal.  Abdominal:     General: Abdomen is flat.     Comments: Drain in place  Genitourinary:    Comments: No CVAT at present.  Neurological:     Mental Status: She is alert.     Assessment/Plan:  Proceed as planned with cystoscopy, bilateral retrogardes, bilateral internalized ureteral stent placement for ureteral marking / protection / decompression. Risks, benefits, alternatives, expected peri-op course discussed as well as fact that internalized stents are temporary and will need to be removed (ideally I noffice about 2-3 weeks post-op).   Sebastian Ache 07/13/2022, 9:40 AM

## 2022-07-13 NOTE — Progress Notes (Signed)
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 She had bilateral hydroureteronephrosis and was recommended Lasix urogram she returned to the hospital 9/13  Abdominal feeling discomfort and had been seen by ID in the clinic given Bentyl for pain--- she was tachycardic tachypneic and found to be hypokalemic she had worsening intra-abdominal infection General surgery saw the patient in addition Noted that she had 40 pound weight loss in 2 months--General surgery discussed plan of care with her and they discussed ileo colic resection and bowel resection urology was consulted in addition to place stents   Hospital-Problem based course  Crohn disease with worsening + recurrent abscesses and multiple admissions Status post surgery Dr. Cliffton Asters 9/15 Defer pain management diet etc. to them continue Zosyn--if possible continue Bactrim however may need to hold Will be n.p.o. for the foreseeable future, continue LR 100 cc/H  DM TY 2 CBG every 4 hourly continue NG sugars 60s to 130s  Mild hypokalemia Replaced with 4 rounds of K, add magnesium for labs tomorrow  DVT prophylaxis: SCDs for now chemical prophylaxis when able Code Status: Full Family Communication: Called aunt but no answer Disposition:  Status is: Inpatient Remains inpatient appropriate because:   Not ready for discharge   Consultants:  GI, general surgery, GU  Procedures:   Antimicrobials:     Subjective: Seen in PACU.-Awakens-in some amount of pain NG in place  Objective: Vitals:   07/13/22 1415 07/13/22 1430 07/13/22 1445  07/13/22 1500  BP: 118/72 109/65 103/66   Pulse: 93 85 73   Resp: 15 16 13 10   Temp:   (!) 97.2 F (36.2 C)   TempSrc:      SpO2: 100% 100% 100%   Weight:      Height:        Intake/Output Summary (Last 24 hours) at 07/13/2022 1528 Last data filed at 07/13/2022 1500 Gross per 24 hour  Intake 7440.8 ml  Output 2450 ml  Net 4990.8 ml   Filed Weights   07/11/22 1851 07/13/22 0814  Weight: 55.8 kg 55.8 kg    Examination:  EOMI NCAT quite frail S1-S2 no murmur no rub no gallop Abdomen soft nontender with postop changes noted NG tube in place S1-S2 no murmur Multiple tattoos No lower extremity edema  Data Reviewed: personally reviewed   CBC    Component Value Date/Time   WBC 7.6 07/12/2022 0529   RBC 3.17 (L) 07/12/2022 0529   HGB 8.7 (L) 07/12/2022 0529   HCT 26.8 (L) 07/12/2022 0529   PLT 276 07/12/2022 0529   MCV 84.5 07/12/2022 0529   MCH 27.4 07/12/2022 0529   MCHC 32.5 07/12/2022 0529   RDW 17.8 (H) 07/12/2022 0529   LYMPHSABS 1.5 07/11/2022 1856   MONOABS 1.1 (H) 07/11/2022 1856   EOSABS 0.2 07/11/2022 1856   BASOSABS 0.0 07/11/2022 1856      Latest Ref Rng & Units 07/13/2022    4:28 AM 07/12/2022    5:29 AM 07/11/2022    6:56 PM  CMP  Glucose 70 - 99 mg/dL 65  80  07/13/2022   BUN 6 - 20 mg/dL <5  6  9   Creatinine 0.44 - 1.00 mg/dL 9.51  8.84  1.66   Sodium 135 - 145 mmol/L 141  136  133   Potassium 3.5 - 5.1 mmol/L 3.3  2.9  2.9   Chloride 98 - 111 mmol/L 109  107  101   CO2 22 - 32 mmol/L 29  23  23    Calcium 8.9 - 10.3 mg/dL 8.3  7.9  8.2   Total Protein 6.5 - 8.1 g/dL   6.2   Total Bilirubin 0.3 - 1.2 mg/dL   0.4   Alkaline Phos 38 - 126 U/L   58   AST 15 - 41 U/L   9   ALT 0 - 44 U/L   9      Radiology Studies: DG C-Arm 1-60 Min-No Report  Result Date: 07/13/2022 Fluoroscopy was utilized by the requesting physician.  No radiographic interpretation.   DG Chest 2 View  Result Date: 07/11/2022 CLINICAL DATA:  Fever; suspected sepsis EXAM:  CHEST - 2 VIEW COMPARISON:  Radiographs 06/11/2022 FINDINGS: No focal consolidation, pleural effusion, or pneumothorax. Normal cardiomediastinal silhouette. No acute osseous abnormality. IMPRESSION: No active cardiopulmonary disease. Electronically Signed   By: 06/13/2022 M.D.   On: 07/11/2022 20:04   CT Abdomen Pelvis W Contrast  Result Date: 07/11/2022 CLINICAL DATA:  Abdomen pain fever EXAM: CT ABDOMEN AND PELVIS WITH CONTRAST TECHNIQUE: Multidetector CT imaging of the abdomen and pelvis was performed using the standard protocol following bolus administration of intravenous contrast. RADIATION DOSE REDUCTION: This exam was performed according to the departmental dose-optimization program which includes automated exposure control, adjustment of the mA and/or kV according to patient size and/or use of iterative reconstruction technique. CONTRAST:  07/13/2022 OMNIPAQUE IOHEXOL 300 MG/ML  SOLN COMPARISON:  CT 06/28/2022, 06/11/2022, 05/03/2022 FINDINGS: Lower chest: Lung bases demonstrate no acute airspace disease. Hepatobiliary: No focal liver abnormality is seen. No gallstones, gallbladder wall thickening, or biliary dilatation. Pancreas: Unremarkable. No pancreatic ductal dilatation or surrounding inflammatory changes. Spleen: Normal in size without focal abnormality. Adrenals/Urinary Tract: Adrenal glands are within normal limits. Prominent right extrarenal pelvis, mildly increased compared to prior. Moderate dilatation of left extrarenal pelvis and ureter, increased compared to most recent prior. Urinary bladder is unremarkable. Stomach/Bowel: The stomach is nondistended. Abnormal thickened small bowel loops with mucosal enhancement in the right lower quadrant worse as compared with 06/28/2022. wall thickening and inflammation at the cecum. Vascular/Lymphatic: Nonaneurysmal aorta. No increased adenopathy. Retroaortic left renal vein. Reproductive: Uterus and bilateral adnexa are unremarkable. Other: Right  pelvic drainage catheter remains in place with small gas and fluid collection surrounding the pigtail. Interval irregular gas and fluid collection in the right lower quadrant measuring about 2.9 x 1.9 cm, series 2, image 50. Increased mildly loculated appearing pelvic effusions. No internal gas bubbles. Musculoskeletal: No acute or significant osseous findings. IMPRESSION: 1. Considerable inflammatory process in the right lower quadrant mostly involving distal small bowel, suspect for active inflammation/Crohn's. Inflammatory changes appear worse as compared with the CT from 06/28/2022. Right lower quadrant drainage catheter remains in place with small residual gas and fluid collection around the pigtail. Interim finding of small irregular gas and fluid collection in the right lower quadrant measuring up to 2.9 cm, this may reflect recurrent abscess or small area of contained perforation. Increased mildly loculated fluid within the pelvis. 2. Increased left hydronephrosis and hydroureter without obstructing stone. Slight increased dilatation of right extrarenal pelvis. Slightly thick-walled appearance of the urinary bladder, possible cystitis. Electronically Signed  By: Jasmine Pang M.D.   On: 07/11/2022 20:03     Scheduled Meds:  fentaNYL       fentaNYL       fentaNYL       HYDROmorphone       [MAR Hold] insulin aspart  0-9 Units Subcutaneous Q4H   midazolam       [MAR Hold] mupirocin ointment  1 Application Nasal BID   [MAR Hold] pantoprazole  40 mg Oral Daily   [MAR Hold] sulfamethoxazole-trimethoprim  1 tablet Oral Once per day on Mon Wed Fri   Continuous Infusions:  acetaminophen     acetaminophen 1,000 mg (07/13/22 1409)   lactated ringers 100 mL/hr at 07/13/22 0958   methocarbamol (ROBAXIN) IV 500 mg (07/13/22 1517)   methocarbamol     [MAR Hold] piperacillin-tazobactam (ZOSYN)  IV 12.5 mL/hr at 07/13/22 0640   sodium chloride 0.9 % with cefoTEtan (CEFOTAN) ADS Med       LOS: 2 days    Time spent: 46  Rhetta Mura, MD [facility] To contact the attending provider between 7A-7P or the covering provider during after hours 7P-7A, please log into the web site [email or web address] and access using universal [facility] password for that web site. If you do not have the password, please call the hospital operator.  07/13/2022, 3:28 PM

## 2022-07-13 NOTE — Brief Op Note (Signed)
07/11/2022 - 07/13/2022  11:53 AM  PATIENT:  Sandra Foster  31 y.o. female  PRE-OPERATIVE DIAGNOSIS:  Crohn's disease  POST-OPERATIVE DIAGNOSIS:  Crohn's disease  PROCEDURE:  Procedure(s): exploratory laparatomy, right hemi-colectomy, and ostomy creation (Right) CYSTOSCOPY WITH STENT REPLACEMENT AND BILATERAL RETROGRADE (Bilateral)  SURGEON:      Sebastian Ache, MD - Primary  PHYSICIAN ASSISTANT:   ASSISTANTS: none   ANESTHESIA:   general  EBL:  minimal   BLOOD ADMINISTERED:none  DRAINS:  foley to gravity    LOCAL MEDICATIONS USED:  NONE  SPECIMEN:  No Specimen  DISPOSITION OF SPECIMEN:  N/A  COUNTS:  YES  TOURNIQUET:  * No tourniquets in log *  DICTATION: .Other Dictation: Dictation Number [id number]  PLAN OF CARE:  remain in OR for general surgery portions  PATIENT DISPOSITION:   as above   Delay start of Pharmacological VTE agent (>24hrs) due to surgical blood loss or risk of bleeding: not applicable

## 2022-07-14 ENCOUNTER — Encounter (HOSPITAL_COMMUNITY): Payer: Self-pay | Admitting: Surgery

## 2022-07-14 DIAGNOSIS — K50014 Crohn's disease of small intestine with abscess: Secondary | ICD-10-CM | POA: Diagnosis not present

## 2022-07-14 LAB — BASIC METABOLIC PANEL
Anion gap: 9 (ref 5–15)
BUN: <5 mg/dL — ABNORMAL LOW (ref 6–20)
CO2: 23 mmol/L (ref 22–32)
Calcium: 8.4 mg/dL — ABNORMAL LOW (ref 8.9–10.3)
Chloride: 108 mmol/L (ref 98–111)
Creatinine, Ser: 0.5 mg/dL (ref 0.44–1.00)
GFR, Estimated: >60 mL/min (ref >60)
Glucose, Bld: 121 mg/dL — ABNORMAL HIGH (ref 70–99)
Potassium: 3.8 mmol/L (ref 3.5–5.1)
Sodium: 140 mmol/L (ref 135–145)

## 2022-07-14 LAB — CBC
HCT: 31.1 % — ABNORMAL LOW (ref 36.0–46.0)
Hemoglobin: 10 g/dL — ABNORMAL LOW (ref 12.0–15.0)
MCH: 27.2 pg (ref 26.0–34.0)
MCHC: 32.2 g/dL (ref 30.0–36.0)
MCV: 84.7 fL (ref 80.0–100.0)
Platelets: 372 10*3/uL (ref 150–400)
RBC: 3.67 MIL/uL — ABNORMAL LOW (ref 3.87–5.11)
RDW: 16.9 % — ABNORMAL HIGH (ref 11.5–15.5)
WBC: 16 10*3/uL — ABNORMAL HIGH (ref 4.0–10.5)
nRBC: 0 % (ref 0.0–0.2)

## 2022-07-14 LAB — GLUCOSE, CAPILLARY
Glucose-Capillary: 105 mg/dL — ABNORMAL HIGH (ref 70–99)
Glucose-Capillary: 110 mg/dL — ABNORMAL HIGH (ref 70–99)
Glucose-Capillary: 135 mg/dL — ABNORMAL HIGH (ref 70–99)
Glucose-Capillary: 97 mg/dL (ref 70–99)
Glucose-Capillary: 110 mg/dL — ABNORMAL HIGH (ref 70–99)
Glucose-Capillary: 124 mg/dL — ABNORMAL HIGH (ref 70–99)

## 2022-07-14 MED ORDER — PHENOL 1.4 % MT LIQD
1.0000 | OROMUCOSAL | Status: DC | PRN
  Administered 2022-07-14: 1 via OROMUCOSAL
  Filled 2022-07-14: qty 177

## 2022-07-14 MED ORDER — KETOROLAC TROMETHAMINE 15 MG/ML IJ SOLN: 15.0000 mg | Freq: Four times a day (QID) | INTRAMUSCULAR | Status: DC | PRN

## 2022-07-14 MED ORDER — KETOROLAC TROMETHAMINE 30 MG/ML IJ SOLN
INTRAMUSCULAR | Status: AC
  Filled 2022-07-14: qty 1

## 2022-07-14 MED ORDER — PANTOPRAZOLE SODIUM 40 MG IV SOLR
40.0000 mg | Freq: Every day | INTRAVENOUS | Status: DC
  Administered 2022-07-14 – 2022-07-19 (×6): 40 mg via INTRAVENOUS
  Filled 2022-07-14 (×6): qty 10

## 2022-07-14 MED ORDER — ACETAMINOPHEN 10 MG/ML IV SOLN
1000.0000 mg | Freq: Four times a day (QID) | INTRAVENOUS | Status: AC | PRN
  Administered 2022-07-14 – 2022-07-16 (×4): 1000 mg via INTRAVENOUS
  Filled 2022-07-14 (×4): qty 100

## 2022-07-14 NOTE — Progress Notes (Signed)
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 She had bilateral hydroureteronephrosis and was recommended Lasix urogram she returned to the hospital 9/13  Abdominal feeling discomfort and had been seen by ID in the clinic given Bentyl for pain--- she was tachycardic tachypneic and found to be hypokalemic she had worsening intra-abdominal infection General surgery saw the patient in addition Noted that she had 40 pound weight loss in 2 months--General surgery discussed plan of care with her and they discussed ileo colic resection and bowel resection urology was consulted in addition to place stents   Hospital-Problem based course  Crohn disease with worsening + recurrent abscesses and multiple admissions Status post surgery Dr. Cliffton Asters 9/15 pain control IV tylenol added and dilaudid to continue--hopefull to graduate diet as per gen surgery Defer pain management diet etc. to them continue Zosyn--if possible continue Bactrim however may need to hold n.p.o. continue LR 100 cc/H  DM TY 2 CBG every 4 hourly continue NG sugars 105-121  Leukocytosis Follow and trend--likely post-op--no concerns for other etiologies--I-S q2-q3 prn  Mild hypokalemia Replaced and improved Check am mag  DVT prophylaxis: SCDs for now chemical prophylaxis when able Code Status: Foster Family Communication: Called aunt but no answer Disposition:  Status is: Inpatient Remains inpatient appropriate because:   Not ready for discharge   Consultants:  GI, general surgery, GU  Procedures:   Antimicrobials:      Subjective:  In pain--requiring iv dilaudid close to q2  Adding tylenol IV No flauts in ostomy yet--doesn't like NGT  Objective: Vitals:   07/13/22 2201 07/14/22 0127 07/14/22 0413 07/14/22 1017  BP: (!) 127/110 103/75 117/72 112/70  Pulse: 87 82 77 75  Resp: 18 18 18 18   Temp: 98.1 F (36.7 C) 97.8 F (36.6 C) 97.7 F (36.5 C) 98 F (36.7 C)  TempSrc: Oral Oral Oral Oral  SpO2: 100% 100% 100% 100%  Weight:      Height:        Intake/Output Summary (Last 24 hours) at 07/14/2022 1115 Last data filed at 07/14/2022 1113 Gross per 24 hour  Intake 3999.34 ml  Output 3125 ml  Net 874.34 ml    Filed Weights   07/11/22 1851 07/13/22 0814  Weight: 55.8 kg 55.8 kg    Examination:  EOMI NCAT NGT in place S1-S2 no murmur no rub no gallop Abdomen soft --slight distension--Some tender on palpation S1-S2 no murmur Multiple tattoos No lower extremity edema  Data Reviewed: personally reviewed   CBC    Component Value Date/Time   WBC 16.0 (H) 07/14/2022 0510   RBC 3.67 (L) 07/14/2022 0510   HGB 10.0 (L) 07/14/2022 0510   HCT 31.1 (L) 07/14/2022 0510   PLT 372 07/14/2022 0510   MCV 84.7 07/14/2022 0510   MCH 27.2 07/14/2022 0510   MCHC 32.2 07/14/2022 0510   RDW 16.9 (H) 07/14/2022 0510   LYMPHSABS 1.5 07/11/2022 1856   MONOABS 1.1 (H) 07/11/2022 1856   EOSABS 0.2 07/11/2022 1856   BASOSABS 0.0 07/11/2022 1856      Latest Ref Rng & Units 07/14/2022    5:10 AM 07/13/2022  4:28 AM 07/12/2022    5:29 AM  CMP  Glucose 70 - 99 mg/dL 121  65  80   BUN 6 - 20 mg/dL <5  <5  6   Creatinine 0.44 - 1.00 mg/dL 0.50  0.44  0.46   Sodium 135 - 145 mmol/L 140  141  136   Potassium 3.5 - 5.1 mmol/L 3.8  3.3  2.9   Chloride 98 - 111 mmol/L 108  109  107   CO2 22 - 32 mmol/L 23  29  23    Calcium 8.9 - 10.3 mg/dL 8.4  8.3  7.9      Radiology Studies: DG C-Arm 1-60 Min-No Report  Result Date: 07/13/2022 Fluoroscopy was utilized by the requesting physician.  No  radiographic interpretation.     Scheduled Meds:  enoxaparin (LOVENOX) injection  40 mg Subcutaneous Q24H   insulin aspart  0-9 Units Subcutaneous Q4H   pantoprazole  40 mg Oral Daily   sulfamethoxazole-trimethoprim  1 tablet Oral Once per day on Mon Wed Fri   Continuous Infusions:  acetaminophen     lactated ringers 125 mL/hr at 07/14/22 0425   methocarbamol (ROBAXIN) IV 1,000 mg (07/14/22 1053)   piperacillin-tazobactam (ZOSYN)  IV 3.375 g (07/14/22 0427)     LOS: 3 days   Time spent: 33  Nita Sells, MD [facility] To contact the attending provider between 7A-7P or the covering provider during after hours 7P-7A, please log into the web site [email or web address] and access using universal [facility] password for that web site. If you do not have the password, please call the hospital operator.  07/14/2022, 11:15 AM

## 2022-07-14 NOTE — Progress Notes (Signed)
    Assessment & Plan: POD#1 - status post right colectomy with end ileostomy  NPO, NG, IVF  May have ice chips  OOB, ambulate in halls  Hgb 10.0  Doing well post op.  Up to chair, planning to ambulate in halls.        Armandina Gemma, MD [facility] [place] practice Office: [phone]        Chief Complaint: Complicated Crohn's disease  Subjective: Patient up in chair, appears comfortable.  NG in place.  Objective: Vital signs in last 24 hours: Temp:  [96.1 F (35.6 C)-98.1 F (36.7 C)] 98 F (36.7 C) (09/16 1017) Pulse Rate:  [73-93] 75 (09/16 1017) Resp:  [10-18] 18 (09/16 1017) BP: (103-127)/(60-110) 112/70 (09/16 1017) SpO2:  [98 %-100 %] 100 % (09/16 1017) Last BM Date : 07/12/22  Intake/Output from previous day: 09/15 0701 - 09/16 0700 In: 5549.3 [I.V.:4404; IV Piggyback:1145.4] Out: 2925 [Urine:2400; Emesis/NG output:325; Blood:200] Intake/Output this shift: Total I/O In: 0  Out: 200 [Urine:200]  Physical Exam: HEENT - sclerae clear, mucous membranes moist Neck - soft Abdomen - soft, mild distension; midline dressing dry and intact; ostomy on right pink, viable Ext - no edema, non-tender Neuro - alert & oriented, no focal deficits  Lab Results:  Recent Labs    07/12/22 0529 07/14/22 0510  WBC 7.6 16.0*  HGB 8.7* 10.0*  HCT 26.8* 31.1*  PLT 276 372   BMET Recent Labs    07/13/22 0428 07/14/22 0510  NA 141 140  K 3.3* 3.8  CL 109 108  CO2 29 23  GLUCOSE 65* 121*  BUN <5* <5*  CREATININE 0.44 0.50  CALCIUM 8.3* 8.4*   PT/INR Recent Labs    07/11/22 1856  LABPROT 15.1  INR 1.2   Comprehensive Metabolic Panel:    Component Value Date/Time   NA 140 07/14/2022 0510   NA 141 07/13/2022 0428   K 3.8 07/14/2022 0510   K 3.3 (L) 07/13/2022 0428   CL 108 07/14/2022 0510   CL 109 07/13/2022 0428   CO2 23 07/14/2022 0510   CO2 29 07/13/2022 0428   BUN <5 (L) 07/14/2022 0510   BUN <5 (L) 07/13/2022 0428    CREATININE 0.50 07/14/2022 0510   CREATININE 0.44 07/13/2022 0428   GLUCOSE 121 (H) 07/14/2022 0510   GLUCOSE 65 (L) 07/13/2022 0428   CALCIUM 8.4 (L) 07/14/2022 0510   CALCIUM 8.3 (L) 07/13/2022 0428   AST 9 (L) 07/11/2022 1856   AST 7 (L) 06/13/2022 0753   ALT 9 07/11/2022 1856   ALT 7 06/13/2022 0753   ALKPHOS 58 07/11/2022 1856   ALKPHOS 50 06/13/2022 0753   BILITOT 0.4 07/11/2022 1856   BILITOT 0.3 06/13/2022 0753   PROT 6.2 (L) 07/11/2022 1856   PROT 6.3 (L) 06/13/2022 0753   ALBUMIN 2.8 (L) 07/11/2022 1856   ALBUMIN 2.3 (L) 06/13/2022 0753    Studies/Results: DG C-Arm 1-60 Min-No Report  Result Date: 07/13/2022 Fluoroscopy was utilized by the requesting physician.  No radiographic interpretation.      Armandina Gemma 07/14/2022   Patient ID: Sandra Foster, female   DOB: February 17, 1991, 31 y.o.   MRN: [id number]

## 2022-07-14 NOTE — Progress Notes (Signed)
Mobility Specialist - Progress Note   07/14/22 1420  Mobility  Activity Ambulated with assistance in hallway  Level of Assistance Standby assist, set-up cues, supervision of patient - no hands on  Assistive Device Front wheel walker  Distance Ambulated (ft) 160 ft  Activity Response Tolerated well  $Mobility charge 1 Mobility   Pt received in chair and agreed for mobility, some pain in abdomen area, 5/10. Pt returned to bed with all needs met and RN in room.   Roderick Pee Mobility Specialist

## 2022-07-15 DIAGNOSIS — K50014 Crohn's disease of small intestine with abscess: Secondary | ICD-10-CM | POA: Diagnosis not present

## 2022-07-15 LAB — CBC
HCT: 24.6 % — ABNORMAL LOW (ref 36.0–46.0)
Hemoglobin: 7.8 g/dL — ABNORMAL LOW (ref 12.0–15.0)
MCH: 27.1 pg (ref 26.0–34.0)
MCHC: 31.7 g/dL (ref 30.0–36.0)
MCV: 85.4 fL (ref 80.0–100.0)
Platelets: 290 10*3/uL (ref 150–400)
RBC: 2.88 MIL/uL — ABNORMAL LOW (ref 3.87–5.11)
RDW: 17.6 % — ABNORMAL HIGH (ref 11.5–15.5)
WBC: 10 10*3/uL (ref 4.0–10.5)
nRBC: 0 % (ref 0.0–0.2)

## 2022-07-15 LAB — GLUCOSE, CAPILLARY
Glucose-Capillary: 128 mg/dL — ABNORMAL HIGH (ref 70–99)
Glucose-Capillary: 128 mg/dL — ABNORMAL HIGH (ref 70–99)
Glucose-Capillary: 133 mg/dL — ABNORMAL HIGH (ref 70–99)
Glucose-Capillary: 67 mg/dL — ABNORMAL LOW (ref 70–99)
Glucose-Capillary: 81 mg/dL (ref 70–99)
Glucose-Capillary: 82 mg/dL (ref 70–99)
Glucose-Capillary: 85 mg/dL (ref 70–99)

## 2022-07-15 LAB — BASIC METABOLIC PANEL
Anion gap: 5 (ref 5–15)
BUN: 6 mg/dL (ref 6–20)
CO2: 26 mmol/L (ref 22–32)
Calcium: 7.9 mg/dL — ABNORMAL LOW (ref 8.9–10.3)
Chloride: 107 mmol/L (ref 98–111)
Creatinine, Ser: 0.47 mg/dL (ref 0.44–1.00)
GFR, Estimated: >60 mL/min (ref >60)
Glucose, Bld: 88 mg/dL (ref 70–99)
Potassium: 3.4 mmol/L — ABNORMAL LOW (ref 3.5–5.1)
Sodium: 138 mmol/L (ref 135–145)

## 2022-07-15 MED ORDER — ONDANSETRON HCL 4 MG/2ML IJ SOLN: 4.0000 mg | Freq: Four times a day (QID) | INTRAMUSCULAR | Status: DC | PRN

## 2022-07-15 MED ORDER — LACTATED RINGERS IV SOLN: Freq: Once | INTRAVENOUS | Status: DC

## 2022-07-15 MED ORDER — NALOXONE HCL 0.4 MG/ML IJ SOLN: 0.4000 mg | INTRAMUSCULAR | Status: DC | PRN

## 2022-07-15 MED ORDER — DIPHENHYDRAMINE HCL 50 MG/ML IJ SOLN: 12.5000 mg | Freq: Four times a day (QID) | INTRAMUSCULAR | Status: DC | PRN

## 2022-07-15 MED ORDER — DIPHENHYDRAMINE HCL 12.5 MG/5ML PO ELIX: 12.5000 mg | ORAL_SOLUTION | Freq: Four times a day (QID) | ORAL | Status: DC | PRN

## 2022-07-15 MED ORDER — DEXTROSE IN LACTATED RINGERS 5 % IV SOLN: INTRAVENOUS | Status: DC

## 2022-07-15 MED ORDER — METHOCARBAMOL 500 MG PO TABS: 500.0000 mg | ORAL_TABLET | Freq: Once | ORAL | Status: DC

## 2022-07-15 MED ORDER — DEXTROSE 50 % IV SOLN
12.5000 g | INTRAVENOUS | Status: AC
  Administered 2022-07-15: 12.5 g via INTRAVENOUS
  Filled 2022-07-15: qty 50

## 2022-07-15 MED ORDER — METHOCARBAMOL 500 MG PO TABS
500.0000 mg | ORAL_TABLET | Freq: Once | ORAL | Status: AC
  Administered 2022-07-15: 500 mg
  Filled 2022-07-15: qty 1

## 2022-07-15 MED ORDER — CHLORHEXIDINE GLUCONATE CLOTH 2 % EX PADS
6.0000 | MEDICATED_PAD | Freq: Every day | CUTANEOUS | Status: DC
  Administered 2022-07-15 – 2022-07-18 (×3): 6 via TOPICAL

## 2022-07-15 MED ORDER — HYDROMORPHONE 1 MG/ML IV SOLN
INTRAVENOUS | Status: DC
  Administered 2022-07-15: 3.3 mg via INTRAVENOUS
  Administered 2022-07-15: 1.2 mg via INTRAVENOUS
  Administered 2022-07-16: 1.8 mg via INTRAVENOUS
  Administered 2022-07-16: 2.7 mg via INTRAVENOUS
  Administered 2022-07-16 – 2022-07-17 (×2): 1.5 mg via INTRAVENOUS
  Administered 2022-07-17: 2.1 mg via INTRAVENOUS
  Filled 2022-07-15: qty 30

## 2022-07-15 MED ORDER — SODIUM CHLORIDE 0.9% FLUSH: 9.0000 mL | INTRAVENOUS | Status: DC | PRN

## 2022-07-15 NOTE — Progress Notes (Signed)
    Assessment & Plan: POD#2 - status post right colectomy with end ileostomy             NPO, NG, IVF             May have ice chips             OOB, ambulate in halls             Pain control an issue - will try dilaudid PCA today   Up to chair, planning to ambulate in halls.          Armandina Gemma, MD [facility] [place] practice Office: [phone]        Chief Complaint: Crohn's disease with complications  Subjective: Patient in chair, crying.  Having pain.  Objective: Vital signs in last 24 hours: Temp:  [97.9 F (36.6 C)-98.3 F (36.8 C)] 98 F (36.7 C) (09/17 0414) Pulse Rate:  [85-104] 85 (09/17 0414) Resp:  [16-18] 16 (09/17 0414) BP: (106-119)/(70-72) 106/70 (09/17 0414) SpO2:  [100 %] 100 % (09/17 0414) Last BM Date : 07/12/22  Intake/Output from previous day: 09/16 0701 - 09/17 0700 In: 3326.5 [P.O.:240; I.V.:2499.7; IV Piggyback:586.8] Out: 1925 [Urine:1150; Emesis/NG output:750; Stool:25] Intake/Output this shift: Total I/O In: 0  Out: 200 [Urine:150; Stool:50]  Physical Exam: HEENT - sclerae clear, mucous membranes moist Neck - soft Abdomen - mild distension; midline dressing dry and intact; ostomy with small succus in bag Ext - no edema, non-tender Neuro - alert & oriented, no focal deficits  Lab Results:  Recent Labs    07/14/22 0510 07/15/22 0454  WBC 16.0* 10.0  HGB 10.0* 7.8*  HCT 31.1* 24.6*  PLT 372 290   BMET Recent Labs    07/14/22 0510 07/15/22 0454  NA 140 138  K 3.8 3.4*  CL 108 107  CO2 23 26  GLUCOSE 121* 88  BUN <5* 6  CREATININE 0.50 0.47  CALCIUM 8.4* 7.9*   PT/INR No results for input(s): "LABPROT", "INR" in the last 72 hours. Comprehensive Metabolic Panel:    Component Value Date/Time   NA 138 07/15/2022 0454   NA 140 07/14/2022 0510   K 3.4 (L) 07/15/2022 0454   K 3.8 07/14/2022 0510   CL 107 07/15/2022 0454   CL 108 07/14/2022 0510   CO2 26 07/15/2022 0454   CO2 23 07/14/2022  0510   BUN 6 07/15/2022 0454   BUN <5 (L) 07/14/2022 0510   CREATININE 0.47 07/15/2022 0454   CREATININE 0.50 07/14/2022 0510   GLUCOSE 88 07/15/2022 0454   GLUCOSE 121 (H) 07/14/2022 0510   CALCIUM 7.9 (L) 07/15/2022 0454   CALCIUM 8.4 (L) 07/14/2022 0510   AST 9 (L) 07/11/2022 1856   AST 7 (L) 06/13/2022 0753   ALT 9 07/11/2022 1856   ALT 7 06/13/2022 0753   ALKPHOS 58 07/11/2022 1856   ALKPHOS 50 06/13/2022 0753   BILITOT 0.4 07/11/2022 1856   BILITOT 0.3 06/13/2022 0753   PROT 6.2 (L) 07/11/2022 1856   PROT 6.3 (L) 06/13/2022 0753   ALBUMIN 2.8 (L) 07/11/2022 1856   ALBUMIN 2.3 (L) 06/13/2022 0753    Studies/Results: DG C-Arm 1-60 Min-No Report  Result Date: 07/13/2022 Fluoroscopy was utilized by the requesting physician.  No radiographic interpretation.      Armandina Gemma 07/15/2022   Patient ID: Bernadette Hoit, female   DOB: 1991-02-21, 31 y.o.   MRN: [id number]

## 2022-07-15 NOTE — Progress Notes (Signed)
MD notified of CBG down to 67, orders noted, IVFs changed and D50 1/2 amp given. Repeat CBG = 128. Will cont to monitor

## 2022-07-15 NOTE — Progress Notes (Signed)
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 She had bilateral hydroureteronephrosis and was recommended Lasix urogram she returned to the hospital 9/13  Abdominal feeling discomfort and had been seen by ID in the clinic given Bentyl for pain--- she was tachycardic tachypneic and found to be hypokalemic she had worsening intra-abdominal infection General surgery saw the patient in addition Noted that she had 40 pound weight loss in 2 months--General surgery discussed plan of care with her and they discussed ileo colic resection and bowel resection urology was consulted in addition to place stents   Hospital-Problem based course  Crohn disease with worsening + recurrent abscesses and multiple admissions Status post surgery Dr. Dema Severin 9/15  Pain uncontrolled adding PCA per general surgery continue Zosyn Ice chips  continue LR 100 cc/H  DM TY 2 CBG every 4 hourly continue NG sugars 80 range  Leukocytosis Follow and trend--was postoperative  Anemia of expected of blood loss from surgery Expect will improve  Mild hypokalemia Recheck a.m.  DVT prophylaxis: SCDs for now chemical prophylaxis when able Code Status: Full Family Communication: discussed with family on phone Disposition:  Status is: Inpatient Remains inpatient appropriate because:   Not ready for discharge   Consultants:  GI, general surgery, GU  Procedures:   Antimicrobials:     Subjective:  Pain continues No distress at present walked yesterday still having cramping abdominal pain No flatus NG putting out moderate  amounts  Objective: Vitals:   07/14/22 2034 07/15/22 0414 07/15/22 1300 07/15/22 1333  BP: 119/71 106/70  119/71  Pulse: (!) 104 85  (!) 103  Resp: 18 16  18   Temp: 98.3 F (36.8 C) 98 F (36.7 C)  98.1 F (36.7 C)  TempSrc: Oral Oral  Oral  SpO2: 100% 100% 97% 100%  Weight:      Height:        Intake/Output Summary (Last 24 hours) at 07/15/2022 1426 Last data filed at 07/15/2022 1400 Gross per 24 hour  Intake 1711.96 ml  Output 1925 ml  Net -213.04 ml    Filed Weights   07/11/22 1851 07/13/22 0814  Weight: 55.8 kg 55.8 kg    Examination:  EOMI NCAT NGT in place S1-S2 no murmur no rub no gallop Abdomen soft --slight distension--Some tender on palpation S1-S2 no murmur no rub no gallop Multiple tattoos No lower extremity edema  Data Reviewed: personally reviewed   CBC    Component Value Date/Time   WBC 10.0 07/15/2022 0454   RBC 2.88 (L) 07/15/2022 0454   HGB 7.8 (L) 07/15/2022 0454   HCT 24.6 (L) 07/15/2022 0454   PLT 290 07/15/2022 0454   MCV 85.4 07/15/2022 0454   MCH 27.1 07/15/2022 0454   MCHC 31.7 07/15/2022 0454   RDW 17.6 (H) 07/15/2022 0454   LYMPHSABS 1.5 07/11/2022 1856   MONOABS 1.1 (H) 07/11/2022 1856   EOSABS 0.2 07/11/2022 1856   BASOSABS 0.0 07/11/2022 1856      Latest Ref Rng & Units 07/15/2022    4:54 AM 07/14/2022    5:10 AM 07/13/2022    4:28 AM  CMP  Glucose 70 -  99 mg/dL 88  592  65   BUN 6 - 20 mg/dL 6  <5  <5   Creatinine 0.44 - 1.00 mg/dL 9.24  4.62  8.63   Sodium 135 - 145 mmol/L 138  140  141   Potassium 3.5 - 5.1 mmol/L 3.4  3.8  3.3   Chloride 98 - 111 mmol/L 107  108  109   CO2 22 - 32 mmol/L 26  23  29    Calcium 8.9 - 10.3 mg/dL 7.9  8.4  8.3      Radiology Studies: No results found.   Scheduled Meds:  Chlorhexidine Gluconate Cloth  6 each Topical Daily   enoxaparin (LOVENOX) injection  40 mg Subcutaneous Q24H   HYDROmorphone   Intravenous Q4H   insulin aspart  0-9 Units Subcutaneous Q4H   pantoprazole  (PROTONIX) IV  40 mg Intravenous Daily   Continuous Infusions:  acetaminophen 1,000 mg (07/15/22 1007)   lactated ringers 100 mL/hr at 07/15/22 1100   piperacillin-tazobactam (ZOSYN)  IV 3.375 g (07/15/22 1222)     LOS: 4 days   Time spent: 24  07/17/22, MD [facility] To contact the attending provider between 7A-7P or the covering provider during after hours 7P-7A, please log into the web site [email or web address] and access using universal [place] password for that web site. If you do not have the password, please call the hospital operator.  07/15/2022, 2:26 PM

## 2022-07-16 DIAGNOSIS — K50014 Crohn's disease of small intestine with abscess: Secondary | ICD-10-CM | POA: Diagnosis not present

## 2022-07-16 LAB — CBC
HCT: 23.5 % — ABNORMAL LOW (ref 36.0–46.0)
Hemoglobin: 7.4 g/dL — ABNORMAL LOW (ref 12.0–15.0)
MCH: 26.7 pg (ref 26.0–34.0)
MCHC: 31.5 g/dL (ref 30.0–36.0)
MCV: 84.8 fL (ref 80.0–100.0)
Platelets: 304 10*3/uL (ref 150–400)
RBC: 2.77 MIL/uL — ABNORMAL LOW (ref 3.87–5.11)
RDW: 17.3 % — ABNORMAL HIGH (ref 11.5–15.5)
WBC: 9.6 10*3/uL (ref 4.0–10.5)
nRBC: 0 % (ref 0.0–0.2)

## 2022-07-16 LAB — BASIC METABOLIC PANEL
Anion gap: 3 — ABNORMAL LOW (ref 5–15)
BUN: <5 mg/dL — ABNORMAL LOW (ref 6–20)
CO2: 29 mmol/L (ref 22–32)
Calcium: 7.9 mg/dL — ABNORMAL LOW (ref 8.9–10.3)
Chloride: 109 mmol/L (ref 98–111)
Creatinine, Ser: 0.39 mg/dL — ABNORMAL LOW (ref 0.44–1.00)
GFR, Estimated: >60 mL/min (ref >60)
Glucose, Bld: 115 mg/dL — ABNORMAL HIGH (ref 70–99)
Potassium: 3 mmol/L — ABNORMAL LOW (ref 3.5–5.1)
Sodium: 141 mmol/L (ref 135–145)

## 2022-07-16 LAB — GLUCOSE, CAPILLARY
Glucose-Capillary: 124 mg/dL — ABNORMAL HIGH (ref 70–99)
Glucose-Capillary: 114 mg/dL — ABNORMAL HIGH (ref 70–99)
Glucose-Capillary: 136 mg/dL — ABNORMAL HIGH (ref 70–99)
Glucose-Capillary: 160 mg/dL — ABNORMAL HIGH (ref 70–99)
Glucose-Capillary: 139 mg/dL — ABNORMAL HIGH (ref 70–99)

## 2022-07-16 LAB — CULTURE, BLOOD (ROUTINE X 2)
Culture: NO GROWTH
Culture: NO GROWTH
Special Requests: ADEQUATE

## 2022-07-16 LAB — SURGICAL PATHOLOGY

## 2022-07-16 MED ORDER — ACETAMINOPHEN 160 MG/5ML PO SOLN
500.0000 mg | ORAL | Status: DC | PRN
  Administered 2022-07-16 – 2022-07-17 (×2): 500 mg via ORAL
  Filled 2022-07-16 (×2): qty 20.3

## 2022-07-16 MED ORDER — KCL-LACTATED RINGERS-D5W 20 MEQ/L IV SOLN
INTRAVENOUS | Status: DC
  Filled 2022-07-16 (×2): qty 1000

## 2022-07-16 NOTE — Consult Note (Addendum)
[facility] Nurse ostomy consult note Pt had ileostomy surgery performed on 9/15. She states it is fine to perform a pouch change today without any family members present, but she will request that her boyfriend be present on Wed for another teaching session.  Stoma type/location: Stoma is red and viable, slightly above skin level, 1 1/2 inches.   Peristomal assessment: intact skin surrounding Output: 50 cc liquid brown stool Ostomy pouching: 2pc.  Education provided:  Demonstrated pouch change; patient watched using a hand-held mirror and asked appropriate questions.  Applied barrier ring and 2 piece pouch, but I will order 5 flexible convex pouches for use during the next pouch change.  Use barrier ring, Lawson # [id number], and flexible convex Roselee Culver [phone].  Extra sets of supplies ordered to the room for bedside nurses' use. Reviewed pouching routines and emptying.  Pt was able to open and close the velcro.    Enrolled patient in [place] program: Yes, today Thank-you,  Julien Girt MSN, [place], [place], [place], [place]

## 2022-07-16 NOTE — Progress Notes (Signed)
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 She had bilateral hydroureteronephrosis and was recommended Lasix urogram she returned to the hospital 9/13  Abdominal feeling discomfort and had been seen by ID in the clinic given Bentyl for pain--- she was tachycardic tachypneic and found to be hypokalemic she had worsening intra-abdominal infection General surgery saw the patient in addition Noted that she had 40 pound weight loss in 2 months--General surgery discussed plan of care with her and they discussed ileo colic resection and bowel resection urology was consulted in addition to place stents   Hospital-Problem based course  Crohn disease with worsening + recurrent abscesses and multiple admissions Status post surgery Dr. Dema Severin 9/15  Pain uncontrolled adding PCA per general surgery continue Zosyn Foley to be out once general surgery discusses with urology plan of care Diet as per general surgery cut back fluids to LR 50 cc/H  Cough, sputum Get chest x-ray today continue I-S-no fever no chills and is already on Zosyn which should cover lungs  DM TY 2 uncontrolled  CBG every 4 hourly continue NG sugars better controlled in the 100-1 20 range  Leukocytosis Follow and trend--was postoperative--White count is now down  Anemia of expected of blood loss from surgery Expect will improve, start supplements when able by mouth  Mild hypokalemia Rechecked and adding K to D5  DVT prophylaxis: SCDs for now chemical prophylaxis when able Code Status: Full Family Communication: discussed with family  on phone Disposition:  Status is: Inpatient Remains inpatient appropriate because:   Not ready for discharge   Consultants:  GI, general surgery, GU  Procedures:   Antimicrobials:     Subjective:  Coughing some-asked also for Tylenol in addition to the PCA No overt fever or chills Seems more comfortable overall Ostomy filled up a bit and has some gas in it so it appears bowel function has returned NG probably will be out later today  Objective: Vitals:   07/16/22 0800 07/16/22 0915 07/16/22 1000 07/16/22 1345  BP:  110/71  102/67  Pulse:  95  91  Resp: 16 18  17   Temp:  98.3 F (36.8 C)  98.4 F (36.9 C)  TempSrc:  Oral  Oral  SpO2: 98% 97% 98% 96%  Weight:      Height:        Intake/Output Summary (Last 24 hours) at 07/16/2022 1424 Last data filed at 07/16/2022 1100 Gross per 24 hour  Intake 1284.34 ml  Output 1650 ml  Net -365.66 ml    Filed Weights   07/11/22 1851 07/13/22 0814  Weight: 55.8 kg 55.8 kg    Examination:  EOMI NCAT coherent awake no distress CTA B no rales rhonchi although decreased air entry posterolaterally Abdomen slightly tender no rebound no guarding She is moving all 4 limbs equally without deficit Neurologically is intact  Data Reviewed: personally reviewed   CBC    Component Value Date/Time   WBC 9.6 07/16/2022 0417   RBC 2.77 (L) 07/16/2022 0417   HGB 7.4 (L) 07/16/2022 0417   HCT 23.5 (L) 07/16/2022 0417   PLT 304 07/16/2022 0417  MCV 84.8 07/16/2022 0417   MCH 26.7 07/16/2022 0417   MCHC 31.5 07/16/2022 0417   RDW 17.3 (H) 07/16/2022 0417   LYMPHSABS 1.5 07/11/2022 1856   MONOABS 1.1 (H) 07/11/2022 1856   EOSABS 0.2 07/11/2022 1856   BASOSABS 0.0 07/11/2022 1856      Latest Ref Rng & Units 07/16/2022    4:17 AM 07/15/2022    4:54 AM 07/14/2022    5:10 AM  CMP  Glucose 70 - 99 mg/dL 149  88  702   BUN 6 - 20 mg/dL <5  6  <5   Creatinine 0.44 - 1.00 mg/dL 6.37  8.58  8.50   Sodium 135 - 145 mmol/L 141  138   140   Potassium 3.5 - 5.1 mmol/L 3.0  3.4  3.8   Chloride 98 - 111 mmol/L 109  107  108   CO2 22 - 32 mmol/L 29  26  23    Calcium 8.9 - 10.3 mg/dL 7.9  7.9  8.4      Radiology Studies: No results found.   Scheduled Meds:  Chlorhexidine Gluconate Cloth  6 each Topical Daily   enoxaparin (LOVENOX) injection  40 mg Subcutaneous Q24H   HYDROmorphone   Intravenous Q4H   insulin aspart  0-9 Units Subcutaneous Q4H   pantoprazole (PROTONIX) IV  40 mg Intravenous Daily   Continuous Infusions:  dextrose 5% lactated ringers with KCl 20 mEq/L 100 mL/hr at 07/16/22 0941   piperacillin-tazobactam (ZOSYN)  IV 3.375 g (07/16/22 1221)     LOS: 5 days   Time spent: 24  07/18/22, MD [facility] To contact the attending provider between 7A-7P or the covering provider during after hours 7P-7A, please log into the web site [email or web address] and access using universal [place] password for that web site. If you do not have the password, please call the hospital operator.  07/16/2022, 2:24 PM

## 2022-07-16 NOTE — Progress Notes (Signed)
Mobility Specialist - Progress Note   07/16/22 1109  Mobility  Activity Ambulated with assistance in hallway  Level of Assistance Contact guard assist, steadying assist  Assistive Device Front wheel walker  Distance Ambulated (ft) 350 ft  Activity Response Tolerated well  $Mobility charge 1 Mobility   Pt received in chair and agreed for mobility. No c/o pain caused by ambulating. Pt returned to chair with all needs met and family in room.   Roderick Pee Mobility Specialist

## 2022-07-16 NOTE — Progress Notes (Signed)
3 Days Post-Op  Subjective: CC: Mostly R sided abdominal pain that is better controlled with PCA. No nausea. NGT output 300/24 hours. Put out 200 from ileostomy this am. Foley still in place. Mobilizing. Notes some sob and a productive cough with clear/white sputum. Coughing makes her abdominal pain worse. Pulling 1500 on IS.   Objective: Vital signs in last 24 hours: Temp:  [98.1 F (36.7 C)-99.9 F (37.7 C)] 98.3 F (36.8 C) (09/18 0915) Pulse Rate:  [80-110] 95 (09/18 0915) Resp:  [15-19] 18 (09/18 0915) BP: (106-119)/(64-73) 110/71 (09/18 0915) SpO2:  [93 %-100 %] 97 % (09/18 0915) FiO2 (%):  [0 %-28 %] 0 % (09/18 0800) Last BM Date : 07/12/22  Intake/Output from previous day: 09/17 0701 - 09/18 0700 In: 2504.3 [P.O.:180; I.V.:2049.3; IV Piggyback:250] Out: 1900 [Urine:1400; Emesis/NG output:300; Stool:200] Intake/Output this shift: Total I/O In: -  Out: 200 [Stool:200]  PE: Gen:  Alert, NAD, pleasant Card:  Reg Pulm: Rales at b/l bases. On o2 (PCA). Normal rate and effort.  Abd: Soft, mild distension, some R sided abdominal ttp as well as around her incision. No rigidity or guarding. Stoma pink, budded and viable. Thin, dark, liquid output in ostomy bag. Midline wound with dressing in place, cdi Ext:  No LE edema Psych: A&Ox3   Lab Results:  Recent Labs    07/15/22 0454 07/16/22 0417  WBC 10.0 9.6  HGB 7.8* 7.4*  HCT 24.6* 23.5*  PLT 290 304   BMET Recent Labs    07/15/22 0454 07/16/22 0417  NA 138 141  K 3.4* 3.0*  CL 107 109  CO2 26 29  GLUCOSE 88 115*  BUN 6 <5*  CREATININE 0.47 0.39*  CALCIUM 7.9* 7.9*   PT/INR No results for input(s): "LABPROT", "INR" in the last 72 hours. CMP     Component Value Date/Time   NA 141 07/16/2022 0417   K 3.0 (L) 07/16/2022 0417   CL 109 07/16/2022 0417   CO2 29 07/16/2022 0417   GLUCOSE 115 (H) 07/16/2022 0417   BUN <5 (L) 07/16/2022 0417   CREATININE 0.39 (L) 07/16/2022 0417   CALCIUM 7.9 (L)  07/16/2022 0417   PROT 6.2 (L) 07/11/2022 1856   ALBUMIN 2.8 (L) 07/11/2022 1856   AST 9 (L) 07/11/2022 1856   ALT 9 07/11/2022 1856   ALKPHOS 58 07/11/2022 1856   BILITOT 0.4 07/11/2022 1856   GFRNONAA >60 07/16/2022 0417   GFRAA >60 08/28/2015 2150   Lipase     Component Value Date/Time   LIPASE 26 06/01/2022 1529    Studies/Results: No results found.  Anti-infectives: Anti-infectives (From admission, onward)    Start     Dose/Rate Route Frequency Ordered Stop   07/13/22 1130  cefOXitin (MEFOXIN) 2 g in dextrose 5 % 50 mL IVPB        2 g 100 mL/hr over 30 Minutes Intravenous  Once 07/13/22 1119 07/13/22 1108   07/13/22 1107  sodium chloride 0.9 % with cefoTEtan (CEFOTAN) ADS Med       Note to Pharmacy: Vevelyn Royals D: cabinet override      07/13/22 1107 07/13/22 2314   07/13/22 0900  sulfamethoxazole-trimethoprim (BACTRIM DS) 800-160 MG per tablet 1 tablet  Status:  Discontinued        1 tablet Oral Once per day on Mon Wed Fri 07/11/22 2257 07/14/22 1125   07/12/22 0400  piperacillin-tazobactam (ZOSYN) IVPB 3.375 g        3.375 g 12.5  mL/hr over 240 Minutes Intravenous Every 8 hours 07/11/22 2026     07/11/22 2030  piperacillin-tazobactam (ZOSYN) IVPB 3.375 g        3.375 g 100 mL/hr over 30 Minutes Intravenous  Once 07/11/22 2026 07/11/22 2208        Assessment/Plan POD 3 s/p Ex Lap, R hemicolectomy, End Ileostomy, LOA x 90 min for Perforating ileocolic Crohn's disease by Dr. Dema Severin on 07/13/22 - Path pending  - Cont abx, plan 5d. Monitor fever and wbc curve - Clamp NGT with ROBF.  - Cont PCA for pain control - WOCN following for new ostomy  - Mobilize - Pulm toilet - Bilateral ureteral stents placed pre-op. Plan follow up with Urology. Will confirm with MD when foley can come out.   FEN - NPO, IVF, recommend replacing K (3.0) VTE - SCDs, Lovenox (hgb stable at 7.4) ID - Zosyn 9/13 >> Afebrile, tachycardia resolved, no hypotension, wbc 9.6 Foley - In  place, as above  - Per TRH -  IDDM  Cough - patient reports productive cough. Exam as above. Will reach to see if they want respiratory cx etc    LOS: 5 days    Jillyn Ledger , [facility] Surgery 07/16/2022, 10:07 AM Please see Amion for pager number during day hours 7:00am-4:30pm

## 2022-07-17 ENCOUNTER — Inpatient Hospital Stay
Admission: RE | Admit: 2022-07-17 | Discharge: 2022-07-17 | Disposition: A | Discharge status: Home / Self Care | Payer: Medicaid Other | Source: Ambulatory Visit | Attending: Surgery | Admitting: Surgery

## 2022-07-17 ENCOUNTER — Other Ambulatory Visit: Payer: Medicaid Other

## 2022-07-17 ENCOUNTER — Inpatient Hospital Stay (HOSPITAL_COMMUNITY): Payer: Medicaid Other

## 2022-07-17 DIAGNOSIS — K50014 Crohn's disease of small intestine with abscess: Secondary | ICD-10-CM | POA: Diagnosis not present

## 2022-07-17 LAB — BASIC METABOLIC PANEL
Anion gap: 5 (ref 5–15)
BUN: <5 mg/dL — ABNORMAL LOW (ref 6–20)
CO2: 27 mmol/L (ref 22–32)
Calcium: 7.9 mg/dL — ABNORMAL LOW (ref 8.9–10.3)
Chloride: 106 mmol/L (ref 98–111)
Creatinine, Ser: 0.39 mg/dL — ABNORMAL LOW (ref 0.44–1.00)
GFR, Estimated: >60 mL/min (ref >60)
Glucose, Bld: 118 mg/dL — ABNORMAL HIGH (ref 70–99)
Potassium: 3 mmol/L — ABNORMAL LOW (ref 3.5–5.1)
Sodium: 138 mmol/L (ref 135–145)

## 2022-07-17 LAB — GLUCOSE, CAPILLARY
Glucose-Capillary: 114 mg/dL — ABNORMAL HIGH (ref 70–99)
Glucose-Capillary: 121 mg/dL — ABNORMAL HIGH (ref 70–99)
Glucose-Capillary: 135 mg/dL — ABNORMAL HIGH (ref 70–99)
Glucose-Capillary: 143 mg/dL — ABNORMAL HIGH (ref 70–99)
Glucose-Capillary: 145 mg/dL — ABNORMAL HIGH (ref 70–99)
Glucose-Capillary: 146 mg/dL — ABNORMAL HIGH (ref 70–99)

## 2022-07-17 LAB — CBC
HCT: 22.7 % — ABNORMAL LOW (ref 36.0–46.0)
Hemoglobin: 7.3 g/dL — ABNORMAL LOW (ref 12.0–15.0)
MCH: 27 pg (ref 26.0–34.0)
MCHC: 32.2 g/dL (ref 30.0–36.0)
MCV: 84.1 fL (ref 80.0–100.0)
Platelets: 317 10*3/uL (ref 150–400)
RBC: 2.7 MIL/uL — ABNORMAL LOW (ref 3.87–5.11)
RDW: 17.4 % — ABNORMAL HIGH (ref 11.5–15.5)
WBC: 8.5 10*3/uL (ref 4.0–10.5)
nRBC: 0 % (ref 0.0–0.2)

## 2022-07-17 LAB — MAGNESIUM: Magnesium: 1.5 mg/dL — ABNORMAL LOW (ref 1.7–2.4)

## 2022-07-17 MED ORDER — MAGNESIUM SULFATE 2 GM/50ML IV SOLN
2.0000 g | Freq: Once | INTRAVENOUS | Status: AC
  Administered 2022-07-17: 2 g via INTRAVENOUS
  Filled 2022-07-17: qty 50

## 2022-07-17 MED ORDER — POTASSIUM CHLORIDE 2 MEQ/ML IV SOLN: INTRAVENOUS | Status: DC

## 2022-07-17 MED ORDER — POTASSIUM CHLORIDE 2 MEQ/ML IV SOLN
INTRAVENOUS | Status: DC
  Filled 2022-07-17 (×4): qty 1000

## 2022-07-17 MED ORDER — ONDANSETRON HCL 4 MG/2ML IJ SOLN: 4.0000 mg | Freq: Four times a day (QID) | INTRAMUSCULAR | Status: DC | PRN

## 2022-07-17 MED ORDER — ACETAMINOPHEN 500 MG PO TABS: 1000.0000 mg | ORAL_TABLET | Freq: Four times a day (QID) | ORAL | Status: DC | PRN

## 2022-07-17 MED ORDER — OXYCODONE HCL 5 MG PO TABS
5.0000 mg | ORAL_TABLET | ORAL | Status: DC | PRN
  Administered 2022-07-17 – 2022-07-18 (×2): 5 mg via ORAL
  Administered 2022-07-19 – 2022-07-20 (×5): 10 mg via ORAL
  Filled 2022-07-17 (×4): qty 2
  Filled 2022-07-17 (×2): qty 1
  Filled 2022-07-17: qty 2

## 2022-07-17 MED ORDER — HYDROMORPHONE HCL 1 MG/ML IJ SOLN
0.5000 mg | INTRAMUSCULAR | Status: DC | PRN
  Administered 2022-07-17 – 2022-07-19 (×10): 1 mg via INTRAVENOUS
  Filled 2022-07-17 (×11): qty 1

## 2022-07-17 NOTE — Progress Notes (Signed)
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 She had bilateral hydroureteronephrosis and was recommended Lasix urogram she returned to the hospital 9/13  Abdominal feeling discomfort and had been seen by ID in the clinic given Bentyl for pain--- she was tachycardic tachypneic and found to be hypokalemic she had worsening intra-abdominal infection General surgery saw the patient in addition Noted that she had 40 pound weight loss in 2 months--General surgery discussed plan of care with her and they discussed ileo colic resection and bowel resection urology was consulted in addition to place stents   Hospital-Problem based course  Crohn disease with worsening + recurrent abscesses and multiple admissions Status post surgery Dr. Cliffton Asters 9/15  Pain moderately controlled-transitioning PCA today --> Dilaudid 0.5-1 mg every 3 as needed severe pain, Oxy IR 5-10 for moderate pain, Tylenol every 6 as needed mild pain and monitor trends Now on full liquid diet-NG tube is out Zosyn given for 6 days and discontinued on 9/18 watch fever curve Foley out today per general surgery  Cough, sputum In retrospect likely secondary to NG tube causing irritation-x-ray pending but doubt any infectious etiology has no white count-continue to use I-S  DM TY 2 uncontrolled  CBG every 4 hourly continue NG sugars better controlled in the 120-140  Leukocytosis Follow and trend--was postoperative--White count is now down  Anemia of expected of blood loss from surgery Given persistence check iron studies with next lab draw-May  give IV iron  Mild hypokalemia Rechecked and adding K to D5-fluids at 100 cc an hour until can take p.o. reliably-magnesium 1.5 so giving 2 g IV today  DVT prophylaxis: SCDs for now chemical prophylaxis when able Code Status: Full Family Communication: No family at bedside today Disposition:  Status is: Inpatient Remains inpatient appropriate because:   Not ready for discharge   Consultants:  GI, general surgery, GU  Procedures:   Antimicrobials:     Subjective:  Seems much more comfortable NG tube out Foley out-ordering some grits no chest pain no fever no nausea no cough at this time and seems to have improved  Objective: Vitals:   07/17/22 0456 07/17/22 0532 07/17/22 0800 07/17/22 0903  BP:  110/68  113/71  Pulse:  100  91  Resp: 15 16 20 18   Temp:  98.8 F (37.1 C)  98.4 F (36.9 C)  TempSrc:  Oral  Oral  SpO2: 97% 98% 98% 99%  Weight:      Height:        Intake/Output Summary (Last 24 hours) at 07/17/2022 0927 Last data filed at 07/17/2022 0616 Gross per 24 hour  Intake 2155.17 ml  Output 3575 ml  Net -1419.83 ml    Filed Weights   07/11/22 1851 07/13/22 0814  Weight: 55.8 kg 55.8 kg    Examination:  No icterus no pallor no submandibular LAN chest is clear no wheeze rales rhonchi Abdomen has ostomy which is working she is quite tender on mild palpation No lower extremity edema S1-S2 no murmur no rub no gallop seems to be in sinus Neuro intact  Data Reviewed: personally reviewed   CBC  Component Value Date/Time   WBC 8.5 07/17/2022 0424   RBC 2.70 (L) 07/17/2022 0424   HGB 7.3 (L) 07/17/2022 0424   HCT 22.7 (L) 07/17/2022 0424   PLT 317 07/17/2022 0424   MCV 84.1 07/17/2022 0424   MCH 27.0 07/17/2022 0424   MCHC 32.2 07/17/2022 0424   RDW 17.4 (H) 07/17/2022 0424   LYMPHSABS 1.5 07/11/2022 1856   MONOABS 1.1 (H) 07/11/2022 1856   EOSABS 0.2 07/11/2022 1856   BASOSABS 0.0 07/11/2022 1856      Latest Ref Rng & Units 07/17/2022     4:24 AM 07/16/2022    4:17 AM 07/15/2022    4:54 AM  CMP  Glucose 70 - 99 mg/dL 118  115  88   BUN 6 - 20 mg/dL <5  <5  6   Creatinine 0.44 - 1.00 mg/dL 0.39  0.39  0.47   Sodium 135 - 145 mmol/L 138  141  138   Potassium 3.5 - 5.1 mmol/L 3.0  3.0  3.4   Chloride 98 - 111 mmol/L 106  109  107   CO2 22 - 32 mmol/L 27  29  26    Calcium 8.9 - 10.3 mg/dL 7.9  7.9  7.9      Radiology Studies: No results found.   Scheduled Meds:  Chlorhexidine Gluconate Cloth  6 each Topical Daily   enoxaparin (LOVENOX) injection  40 mg Subcutaneous Q24H   insulin aspart  0-9 Units Subcutaneous Q4H   pantoprazole (PROTONIX) IV  40 mg Intravenous Daily   Continuous Infusions:  dextrose 5 % 1,000 mL with potassium chloride 40 mEq infusion 100 mL/hr at 07/17/22 0851     LOS: 6 days   Time spent: [place], MD [facility] To contact the attending provider between 7A-7P or the covering provider during after hours 7P-7A, please log into the web site [email or web address] and access using universal  password for that web site. If you do not have the password, please call the hospital operator.  07/17/2022, 9:27 AM

## 2022-07-17 NOTE — Progress Notes (Signed)
Mobility Specialist Cancellation/Refusal Note:    07/17/22 1016  Mobility  Activity Refused mobility    Reason for Cancellation/Refusal: Pt declined mobility at this time. Pt eating breakfast at this time.  Will check back as schedule permits.       [facility]

## 2022-07-17 NOTE — Progress Notes (Signed)
4 Days Post-Op  Subjective: CC: Stable abdominal pain that is well controlled with PCA. Mainly on the R side of her abdomen. NGT still in place, clamped. She is tolerating clears without n/v, increased pain, bloating/distension. Having ostomy output. Foley still in place. Up to chair yesterday. Still with productive cough.   Objective: Vital signs in last 24 hours: Temp:  [98.3 F (36.8 C)-99.3 F (37.4 C)] 98.8 F (37.1 C) (09/19 0532) Pulse Rate:  [91-107] 100 (09/19 0532) Resp:  [15-19] 16 (09/19 0532) BP: (102-113)/(67-71) 110/68 (09/19 0532) SpO2:  [95 %-98 %] 98 % (09/19 0532) FiO2 (%):  [0 %] 0 % (09/19 0456) Last BM Date : 07/16/22  Intake/Output from previous day: 09/18 0701 - 09/19 0700 In: 2155.2 [P.O.:540; I.V.:1448.6; IV Piggyback:166.6] Out: 3575 [Urine:2250; Stool:1325] Intake/Output this shift: No intake/output data recorded.  PE: Gen:  Alert, NAD, pleasant Card:  Reg rate on my exa, Pulm: CTA b/l. On o2 (PCA). Normal rate and effort.  Abd: Soft, mild (less) distension, some R sided abdominal ttp as well as around her incision that is stable from yesterday. No rigidity or guarding. Stoma pink, budded and viable. Thin, dark, liquid output in ostomy bag. Midline wound with dressing in place, cdi Ext:  No LE edema Psych: A&Ox3   Lab Results:  Recent Labs    07/16/22 0417 07/17/22 0424  WBC 9.6 8.5  HGB 7.4* 7.3*  HCT 23.5* 22.7*  PLT 304 317   BMET Recent Labs    07/16/22 0417 07/17/22 0424  NA 141 138  K 3.0* 3.0*  CL 109 106  CO2 29 27  GLUCOSE 115* 118*  BUN <5* <5*  CREATININE 0.39* 0.39*  CALCIUM 7.9* 7.9*   PT/INR No results for input(s): "LABPROT", "INR" in the last 72 hours. CMP     Component Value Date/Time   NA 138 07/17/2022 0424   K 3.0 (L) 07/17/2022 0424   CL 106 07/17/2022 0424   CO2 27 07/17/2022 0424   GLUCOSE 118 (H) 07/17/2022 0424   BUN <5 (L) 07/17/2022 0424   CREATININE 0.39 (L) 07/17/2022 0424   CALCIUM 7.9  (L) 07/17/2022 0424   PROT 6.2 (L) 07/11/2022 1856   ALBUMIN 2.8 (L) 07/11/2022 1856   AST 9 (L) 07/11/2022 1856   ALT 9 07/11/2022 1856   ALKPHOS 58 07/11/2022 1856   BILITOT 0.4 07/11/2022 1856   GFRNONAA >60 07/17/2022 0424   GFRAA >60 08/28/2015 2150   Lipase     Component Value Date/Time   LIPASE 26 06/01/2022 1529    Studies/Results: No results found.  Anti-infectives: Anti-infectives (From admission, onward)    Start     Dose/Rate Route Frequency Ordered Stop   07/13/22 1130  cefOXitin (MEFOXIN) 2 g in dextrose 5 % 50 mL IVPB        2 g 100 mL/hr over 30 Minutes Intravenous  Once 07/13/22 1119 07/13/22 1108   07/13/22 1107  sodium chloride 0.9 % with cefoTEtan (CEFOTAN) ADS Med       Note to Pharmacy: Vevelyn Royals D: cabinet override      07/13/22 1107 07/13/22 2314   07/13/22 0900  sulfamethoxazole-trimethoprim (BACTRIM DS) 800-160 MG per tablet 1 tablet  Status:  Discontinued        1 tablet Oral Once per day on Mon Wed Fri 07/11/22 2257 07/14/22 1125   07/12/22 0400  piperacillin-tazobactam (ZOSYN) IVPB 3.375 g        3.375 g 12.5 mL/hr over  240 Minutes Intravenous Every 8 hours 07/11/22 2026     07/11/22 2030  piperacillin-tazobactam (ZOSYN) IVPB 3.375 g        3.375 g 100 mL/hr over 30 Minutes Intravenous  Once 07/11/22 2026 07/11/22 2208        Assessment/Plan POD 4 s/p Ex Lap, R hemicolectomy, End Ileostomy, LOA x 90 min for Perforating ileocolic Crohn's disease by Dr. Dema Severin on 07/13/22 - Path c/w Crohn's enteritis, no dysplasia or malignancy  - Cont abx, plan 5d. Monitor fever and wbc curve - D/c NGT. Adv to FLD - D/c PCA, Oral pain meds + Dilaudid prn - WOCN following for new ostomy. Would like to take off midline dressing during next ostomy bag change (ostomy bag is currently overlapping tegaderm from midline dressing) - Mobilize in halls - Pulm toilet - Bilateral ureteral stents placed pre-op. Plan follow up with Urology. D/c Foley today    FEN - FLD, IVF per [facility] VTE - SCDs, Lovenox (hgb stable at 7.3) ID - Zosyn 9/13 >> Afebrile, HR ~100, no hypotension, wbc 8.5 Foley - D/c as above, TOV   - Per [facility] -  IDDM  Cough - patient reports productive cough. CXR scheduled for today. Resp cx. Pulm toilet   LOS: 6 days    Jillyn Ledger , [facility] Surgery 07/17/2022, 8:12 AM Please see Amion for pager number during day hours 7:00am-4:30pm

## 2022-07-17 NOTE — Progress Notes (Signed)
Mobility Specialist - Progress Note   07/17/22 1553  Mobility  HOB Elevated/Bed Position Self regulated  Activity Ambulated with assistance in hallway  Range of Motion/Exercises Active  Level of Assistance Independent after set-up  Assistive Device Front wheel walker  Distance Ambulated (ft) 185 ft  Activity Response Tolerated well  Transport method Ambulatory  $Mobility charge 1 Mobility   Pt received in bed and agreeable to mobility. C/o tight pain on abdomen.  Pt to bed after session with all needs met.    [facility]

## 2022-07-17 NOTE — Plan of Care (Signed)
  Problem: Education: Goal: Knowledge of General Education information will improve Description Including pain rating scale, medication(s)/side effects and non-pharmacologic comfort measures Outcome: Progressing   

## 2022-07-18 DIAGNOSIS — K50014 Crohn's disease of small intestine with abscess: Secondary | ICD-10-CM | POA: Diagnosis not present

## 2022-07-18 LAB — BASIC METABOLIC PANEL
Anion gap: 5 (ref 5–15)
BUN: <5 mg/dL — ABNORMAL LOW (ref 6–20)
CO2: 25 mmol/L (ref 22–32)
Calcium: 7.8 mg/dL — ABNORMAL LOW (ref 8.9–10.3)
Chloride: 108 mmol/L (ref 98–111)
Creatinine, Ser: <0.3 mg/dL — ABNORMAL LOW (ref 0.44–1.00)
Glucose, Bld: 113 mg/dL — ABNORMAL HIGH (ref 70–99)
Potassium: 3.4 mmol/L — ABNORMAL LOW (ref 3.5–5.1)
Sodium: 138 mmol/L (ref 135–145)

## 2022-07-18 LAB — CBC
HCT: 22.6 % — ABNORMAL LOW (ref 36.0–46.0)
Hemoglobin: 7.2 g/dL — ABNORMAL LOW (ref 12.0–15.0)
MCH: 27 pg (ref 26.0–34.0)
MCHC: 31.9 g/dL (ref 30.0–36.0)
MCV: 84.6 fL (ref 80.0–100.0)
Platelets: 330 10*3/uL (ref 150–400)
RBC: 2.67 MIL/uL — ABNORMAL LOW (ref 3.87–5.11)
RDW: 17.2 % — ABNORMAL HIGH (ref 11.5–15.5)
WBC: 6.7 10*3/uL (ref 4.0–10.5)
nRBC: 0 % (ref 0.0–0.2)

## 2022-07-18 LAB — GLUCOSE, CAPILLARY
Glucose-Capillary: 118 mg/dL — ABNORMAL HIGH (ref 70–99)
Glucose-Capillary: 122 mg/dL — ABNORMAL HIGH (ref 70–99)
Glucose-Capillary: 130 mg/dL — ABNORMAL HIGH (ref 70–99)
Glucose-Capillary: 131 mg/dL — ABNORMAL HIGH (ref 70–99)
Glucose-Capillary: 135 mg/dL — ABNORMAL HIGH (ref 70–99)
Glucose-Capillary: 138 mg/dL — ABNORMAL HIGH (ref 70–99)

## 2022-07-18 LAB — IRON AND TIBC
Iron: 12 ug/dL — ABNORMAL LOW (ref 28–170)
Saturation Ratios: 8 % — ABNORMAL LOW (ref 10.4–31.8)
TIBC: 147 ug/dL — ABNORMAL LOW (ref 250–450)
UIBC: 135 ug/dL

## 2022-07-18 MED ORDER — SODIUM CHLORIDE 0.9 % IV SOLN
250.0000 mg | Freq: Once | INTRAVENOUS | Status: AC
  Administered 2022-07-18: 250 mg via INTRAVENOUS
  Filled 2022-07-18: qty 20

## 2022-07-18 MED ORDER — POTASSIUM CHLORIDE CRYS ER 20 MEQ PO TBCR
40.0000 meq | EXTENDED_RELEASE_TABLET | Freq: Once | ORAL | Status: AC
  Administered 2022-07-18: 40 meq via ORAL
  Filled 2022-07-18: qty 2

## 2022-07-18 MED ORDER — INSULIN ASPART 100 UNIT/ML IJ SOLN
0.0000 [IU] | Freq: Three times a day (TID) | INTRAMUSCULAR | Status: DC
  Administered 2022-07-18: 2 [IU] via SUBCUTANEOUS
  Administered 2022-07-19: 3 [IU] via SUBCUTANEOUS
  Administered 2022-07-20: 2 [IU] via SUBCUTANEOUS

## 2022-07-18 MED ORDER — CALCIUM POLYCARBOPHIL 625 MG PO TABS
625.0000 mg | ORAL_TABLET | Freq: Every day | ORAL | Status: DC
  Administered 2022-07-18 – 2022-07-20 (×3): 625 mg via ORAL
  Filled 2022-07-18 (×3): qty 1

## 2022-07-18 MED ORDER — INSULIN ASPART 100 UNIT/ML IJ SOLN: 0.0000 [IU] | Freq: Every day | INTRAMUSCULAR | Status: DC

## 2022-07-18 NOTE — Progress Notes (Signed)
Mobility Specialist - Progress Note   07/18/22 1549  Mobility  HOB Elevated/Bed Position Self regulated  Activity Ambulated with assistance in hallway  Range of Motion/Exercises Active  Level of Assistance Modified independent, requires aide device or extra time  Assistive Device Front wheel walker  Distance Ambulated (ft) 250 ft  Activity Response Tolerated well  Transport method Ambulatory  $Mobility charge 1 Mobility   Pt received in bed and agreeable to mobility. No complaints during session. Pt to bed after session with all needs met.    [facility]

## 2022-07-18 NOTE — Progress Notes (Signed)
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 She had bilateral hydroureteronephrosis and was recommended Lasix urogram she returned to the hospital 9/13  Abdominal feeling discomfort and had been seen by ID in the clinic given Bentyl for pain--- she was tachycardic tachypneic and found to be hypokalemic she had worsening intra-abdominal infection General surgery saw the patient in addition Noted that she had 40 pound weight loss in 2 months--General surgery discussed plan of care with her and they discussed ileo colic resection and bowel resection urology was consulted in addition to place stents   Hospital-Problem based course  Crohn disease with worsening + recurrent abscesses and multiple admissions Status post surgery Dr. Cliffton Asters 9/15  Pain control Diet advanced Zosyn given for 6 days and discontinued on 9/18  Cough, sputum In retrospect likely secondary to NG tube causing irritation -xray per Dr. Mahala Menghini  DM TY 2 uncontrolled  -SSI  Leukocytosis -resolved  Anemia of expected of blood loss from surgery -dose of IV Fe  Mild hypokalemia -replete  DVT prophylaxis: SCDs for now chemical prophylaxis when able Code Status: Full Family Communication: No family at bedside today Disposition:  Status is: Inpatient Remains inpatient appropriate because: home 24 hours    Consultants:  GI, general surgery, GU    Subjective: Tolerating regular diet  Objective: Vitals:   07/17/22 1219 07/17/22 2024 07/18/22 0444 07/18/22 0920  BP: 118/74 119/73 124/69 98/62  Pulse: 98 (!) 107 96 90   Resp: 18 18 18 17   Temp:  98.8 F (37.1 C) 99 F (37.2 C)   TempSrc:  Oral Oral   SpO2: 100% 97% 98% 100%  Weight:      Height:        Intake/Output Summary (Last 24 hours) at 07/18/2022 1137 Last data filed at 07/18/2022 1000 Gross per 24 hour  Intake 2445.41 ml  Output 1800 ml  Net 645.41 ml   Filed Weights   07/11/22 1851 07/13/22 0814  Weight: 55.8 kg 55.8 kg    Examination:   General: Appearance:    Thin female in no acute distress     Lungs:     Clear to auscultation bilaterally, respirations unlabored  Heart:    Normal heart rate. Normal rhythm. No murmurs, rubs, or gallops.   MS:   All extremities are intact.   Neurologic:   Awake, alert, oriented x 3. No apparent focal neurological           defect.      Data Reviewed: personally reviewed   CBC    Component Value Date/Time   WBC 6.7 07/18/2022 0441   RBC 2.67 (L) 07/18/2022 0441   HGB 7.2 (L) 07/18/2022 0441   HCT 22.6 (L) 07/18/2022 0441   PLT 330 07/18/2022 0441   MCV 84.6 07/18/2022 0441   MCH 27.0 07/18/2022 0441   MCHC 31.9 07/18/2022 0441   RDW 17.2 (H) 07/18/2022 0441   LYMPHSABS 1.5 07/11/2022 1856   MONOABS 1.1 (H) 07/11/2022 1856   EOSABS 0.2 07/11/2022 1856   BASOSABS 0.0 07/11/2022 1856      Latest Ref Rng & Units 07/18/2022  4:41 AM 07/17/2022    4:24 AM 07/16/2022    4:17 AM  CMP  Glucose 70 - 99 mg/dL 113  118  115   BUN 6 - 20 mg/dL <5  <5  <5   Creatinine 0.44 - 1.00 mg/dL <0.30  0.39  0.39   Sodium 135 - 145 mmol/L 138  138  141   Potassium 3.5 - 5.1 mmol/L 3.4  3.0  3.0   Chloride 98 - 111 mmol/L 108  106  109   CO2 22 - 32 mmol/L 25  27  29    Calcium 8.9 - 10.3 mg/dL 7.8  7.9  7.9      Radiology Studies: DG Chest 2 View  Result Date: 07/17/2022 CLINICAL DATA:  Hx of Crohns disease and diabetic. Patient reports congestion, no pain and ex-smoker. EXAM: CHEST - 2 VIEW COMPARISON:  Chest x-ray 07/11/2022. FINDINGS: Apparent air underneath the right hemidiaphragm. No  consolidation. No visible pleural effusions pneumothorax. Cardiomediastinal silhouette is within normal limits. IMPRESSION: 1. Apparent air underneath the right hemidiaphragm. This is concerning for pneumoperitoneum, although interposed colon is a possibility. Recommend left lateral decubitus abdominal radiograph to assess for pneumoperitoneum. 2. No acute cardiopulmonary disease. These results will be called to the ordering clinician or representative by the Radiologist Assistant, and communication documented in the PACS or [employer]. Electronically Signed   By: Margaretha Sheffield M.D.   On: 07/17/2022 11:48     Scheduled Meds:  Chlorhexidine Gluconate Cloth  6 each Topical Daily   enoxaparin (LOVENOX) injection  40 mg Subcutaneous Q24H   insulin aspart  0-9 Units Subcutaneous Q4H   pantoprazole (PROTONIX) IV  40 mg Intravenous Daily   polycarbophil  625 mg Oral Daily   Continuous Infusions:  ferric gluconate (FERRLECIT) IVPB       LOS: 7 days   Time spent: [place], DO [facility] To contact the attending provider between 7A-7P or the covering provider during after hours 7P-7A, please log into the web site [email or web address] and access using universal [place] password for that web site. If you do not have the password, please call the hospital operator.  07/18/2022, 11:37 AM

## 2022-07-18 NOTE — Progress Notes (Signed)
5 Days Post-Op   Chief Complaint/Subjective: Tolerating liquids, increased urination, pain controll  Objective: Vital signs in last 24 hours: Temp:  [98.4 F (36.9 C)-99 F (37.2 C)] 99 F (37.2 C) (09/20 0444) Pulse Rate:  [91-107] 96 (09/20 0444) Resp:  [18] 18 (09/20 0444) BP: (113-124)/(69-74) 124/69 (09/20 0444) SpO2:  [97 %-100 %] 98 % (09/20 0444) Last BM Date : 07/18/22 Intake/Output from previous day: 09/19 0701 - 09/20 0700 In: 2439.3 [P.O.:300; I.V.:2089.3; IV Piggyback:50] Out: 2100 [Stool:2100] Intake/Output this shift: No intake/output data recorded.  PE: Gen: NAD Resp: nonlabored Card: RRR Abd: soft, ostomy with liquid output, wound bandaged  Lab Results:  Recent Labs    07/17/22 0424 07/18/22 0441  WBC 8.5 6.7  HGB 7.3* 7.2*  HCT 22.7* 22.6*  PLT 317 330   BMET Recent Labs    07/17/22 0424 07/18/22 0441  NA 138 138  K 3.0* 3.4*  CL 106 108  CO2 27 25  GLUCOSE 118* 113*  BUN <5* <5*  CREATININE 0.39* <0.30*  CALCIUM 7.9* 7.8*   PT/INR No results for input(s): "LABPROT", "INR" in the last 72 hours. CMP     Component Value Date/Time   NA 138 07/18/2022 0441   K 3.4 (L) 07/18/2022 0441   CL 108 07/18/2022 0441   CO2 25 07/18/2022 0441   GLUCOSE 113 (H) 07/18/2022 0441   BUN <5 (L) 07/18/2022 0441   CREATININE <0.30 (L) 07/18/2022 0441   CALCIUM 7.8 (L) 07/18/2022 0441   PROT 6.2 (L) 07/11/2022 1856   ALBUMIN 2.8 (L) 07/11/2022 1856   AST 9 (L) 07/11/2022 1856   ALT 9 07/11/2022 1856   ALKPHOS 58 07/11/2022 1856   BILITOT 0.4 07/11/2022 1856   GFRNONAA NOT CALCULATED 07/18/2022 0441   GFRAA >60 08/28/2015 2150   Lipase     Component Value Date/Time   LIPASE 26 06/01/2022 1529    Studies/Results: DG Chest 2 View  Result Date: 07/17/2022 CLINICAL DATA:  Hx of Crohns disease and diabetic. Patient reports congestion, no pain and ex-smoker. EXAM: CHEST - 2 VIEW COMPARISON:  Chest x-ray 07/11/2022. FINDINGS: Apparent air  underneath the right hemidiaphragm. No consolidation. No visible pleural effusions pneumothorax. Cardiomediastinal silhouette is within normal limits. IMPRESSION: 1. Apparent air underneath the right hemidiaphragm. This is concerning for pneumoperitoneum, although interposed colon is a possibility. Recommend left lateral decubitus abdominal radiograph to assess for pneumoperitoneum. 2. No acute cardiopulmonary disease. These results will be called to the ordering clinician or representative by the Radiologist Assistant, and communication documented in the PACS or [employer]. Electronically Signed   By: Margaretha Sheffield M.D.   On: 07/17/2022 11:48    Anti-infectives: Anti-infectives (From admission, onward)    Start     Dose/Rate Route Frequency Ordered Stop   07/13/22 1130  cefOXitin (MEFOXIN) 2 g in dextrose 5 % 50 mL IVPB        2 g 100 mL/hr over 30 Minutes Intravenous  Once 07/13/22 1119 07/13/22 1108   07/13/22 1107  sodium chloride 0.9 % with cefoTEtan (CEFOTAN) ADS Med       Note to Pharmacy: Randa Evens D: cabinet override      07/13/22 1107 07/13/22 2314   07/13/22 0900  sulfamethoxazole-trimethoprim (BACTRIM DS) 800-160 MG per tablet 1 tablet  Status:  Discontinued        1 tablet Oral Once per day on Mon Wed Fri 07/11/22 2257 07/14/22 1125   07/12/22 0400  piperacillin-tazobactam (ZOSYN) IVPB 3.375 g  Status:  Discontinued        3.375 g 12.5 mL/hr over 240 Minutes Intravenous Every 8 hours 07/11/22 2026 07/17/22 0828   07/11/22 2030  piperacillin-tazobactam (ZOSYN) IVPB 3.375 g        3.375 g 100 mL/hr over 30 Minutes Intravenous  Once 07/11/22 2026 07/11/22 2208       Assessment/Plan  s/p Procedure(s): exploratory laparatomy, right hemi-colectomy, and ileostomy creation, and lysis of adhesions CYSTOSCOPY WITH STENT REPLACEMENT AND BILATERAL RETROGRADE 07/13/2022    FEN - reg diet VTE - lovenox ID - Zosyn 9/13-9/19 Disposition - inpatient   LOS: 7 days    I reviewed last 24 h vitals and pain scores, last 48 h intake and output, last 24 h labs and trends, and last 24 h imaging results.  This care required high  level of medical decision making.   [facility] 07/18/2022, 8:18 AM Please see Amion for pager number during day hours 7:00am-4:30pm or 7:00am -11:30am on weekends

## 2022-07-18 NOTE — Progress Notes (Signed)
Mobility Specialist - Progress Note   07/18/22 1218  Mobility  HOB Elevated/Bed Position Self regulated  Activity Ambulated with assistance in hallway  Range of Motion/Exercises Active  Level of Assistance Modified independent, requires aide device or extra time  Assistive Device Other (Comment) (IV Pole)  Distance Ambulated (ft) 350 ft  Activity Response Tolerated well  Transport method Ambulatory  $Mobility charge 1 Mobility   Pt received in bed and agreeable to mobility. C/o abdomen pain when stepping on right foot.  Pt to bed after session with all needs met.     Sandra Foster  Mobility Specialist  

## 2022-07-18 NOTE — Consult Note (Addendum)
[place] Nurse ostomy follow up Pouch change and teaching session performed with patient and boyfriend present.  Stoma type/location: Stoma is red and viable, slightly above skin level, 1 1/2 inches Peristomal assessment: intact skin surrounding Output: 200 cc liquid green stool Ostomy pouching: 1pc.  Education provided: Pt was able to stretch barrier ring to fit the opening and apply one piece flexible convex pouch.  Removed dressing over midline abd wound as requested in the surgical PA progress notes.  Extra tape applied over staple incision. Pt was able to open and close velcro to empty independently. Boyfriend watched all the steps.  Both asked appropriate questions.  Discussed dietary precautions and improtance of avoiding dehydration.  Reviewed pouching routines and ordering supplies. Demonstrated use of ostomy belt and left one in the room. Educational materials left in the room, along with 4 flexible convex pouches, [place] # [id number] and 4 barrier rings, Kellie Simmering # [id number]. Enrolled patient in [place] Start Discharge program: Yes, previously  Pt could benefit from follow-up at the outpatient ostomy clinic.  Please refer if desired.   Thank-you,  Julien Girt MSN, [place], [place], [place], [place]

## 2022-07-19 DIAGNOSIS — K50014 Crohn's disease of small intestine with abscess: Secondary | ICD-10-CM | POA: Diagnosis not present

## 2022-07-19 LAB — URINALYSIS, COMPLETE (UACMP) WITH MICROSCOPIC
Bacteria, UA: NONE SEEN
Bilirubin Urine: NEGATIVE
Glucose, UA: NEGATIVE mg/dL
Ketones, ur: NEGATIVE mg/dL
Leukocytes,Ua: NEGATIVE
Nitrite: NEGATIVE
Protein, ur: NEGATIVE mg/dL
Specific Gravity, Urine: 1.006 (ref 1.005–1.030)
pH: 8 (ref 5.0–8.0)

## 2022-07-19 LAB — BASIC METABOLIC PANEL
Anion gap: 4 — ABNORMAL LOW (ref 5–15)
BUN: <5 mg/dL — ABNORMAL LOW (ref 6–20)
CO2: 26 mmol/L (ref 22–32)
Calcium: 8.4 mg/dL — ABNORMAL LOW (ref 8.9–10.3)
Chloride: 110 mmol/L (ref 98–111)
Creatinine, Ser: 0.47 mg/dL (ref 0.44–1.00)
GFR, Estimated: >60 mL/min (ref >60)
Glucose, Bld: 115 mg/dL — ABNORMAL HIGH (ref 70–99)
Potassium: 4.5 mmol/L (ref 3.5–5.1)
Sodium: 140 mmol/L (ref 135–145)

## 2022-07-19 LAB — GLUCOSE, CAPILLARY
Glucose-Capillary: 102 mg/dL — ABNORMAL HIGH (ref 70–99)
Glucose-Capillary: 108 mg/dL — ABNORMAL HIGH (ref 70–99)
Glucose-Capillary: 166 mg/dL — ABNORMAL HIGH (ref 70–99)
Glucose-Capillary: 120 mg/dL — ABNORMAL HIGH (ref 70–99)

## 2022-07-19 LAB — CBC
HCT: 25.1 % — ABNORMAL LOW (ref 36.0–46.0)
Hemoglobin: 7.8 g/dL — ABNORMAL LOW (ref 12.0–15.0)
MCH: 26.6 pg (ref 26.0–34.0)
MCHC: 31.1 g/dL (ref 30.0–36.0)
MCV: 85.7 fL (ref 80.0–100.0)
Platelets: 394 10*3/uL (ref 150–400)
RBC: 2.93 MIL/uL — ABNORMAL LOW (ref 3.87–5.11)
RDW: 17.2 % — ABNORMAL HIGH (ref 11.5–15.5)
WBC: 7.2 10*3/uL (ref 4.0–10.5)
nRBC: 0 % (ref 0.0–0.2)

## 2022-07-19 MED ORDER — PANTOPRAZOLE SODIUM 40 MG PO TBEC
40.0000 mg | DELAYED_RELEASE_TABLET | Freq: Every day | ORAL | Status: DC
  Administered 2022-07-20: 40 mg via ORAL
  Filled 2022-07-19: qty 1

## 2022-07-19 MED ORDER — METHOCARBAMOL 500 MG PO TABS
1000.0000 mg | ORAL_TABLET | Freq: Three times a day (TID) | ORAL | Status: DC
  Administered 2022-07-19 – 2022-07-20 (×4): 1000 mg via ORAL
  Filled 2022-07-19 (×4): qty 2

## 2022-07-19 NOTE — Consult Note (Signed)
[place] Nurse ostomy follow up Patient receiving care in [place]. Dr. Princella Ion requesting [place] team for further ostomy teaching as [facility] [place] RN is not possible.  Per my Secure Chat conversation with Dr. Eliseo Squires, seeing the patient tomorrow morning will be fine.  She may be discharged tomorrow.  Val Riles, RN, MSN, CWOCN, CNS-BC, pager [phone]

## 2022-07-19 NOTE — Progress Notes (Signed)
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 She had bilateral hydroureteronephrosis and was recommended Lasix urogram she returned to the hospital 9/13  Abdominal feeling discomfort and had been seen by ID in the clinic given Bentyl for pain--- she was tachycardic tachypneic and found to be hypokalemic she had worsening intra-abdominal infection General surgery saw the patient in addition Noted that she had 40 pound weight loss in 2 months--General surgery discussed plan of care with her and they discussed ileo colic resection and bowel resection urology was consulted in addition to place stents   Hospital-Problem based course  Crohn disease with worsening + recurrent abscesses and multiple admissions Status post surgery Dr. Cliffton Asters 9/15  Pain control Diet advanced Zosyn given for 6 days and discontinued on 9/18  DM TY 2 uncontrolled  -SSI  Leukocytosis -resolved  Anemia of expected of blood loss from surgery -dose of IV Fe  Mild hypokalemia -replete  DVT prophylaxis: SCDs for now chemical prophylaxis when able Code Status: Full Disposition:  Status is: Inpatient Remains inpatient appropriate because: home 24 hours    Consultants:  GI, general surgery, GU    Subjective: Sad this AM  Objective: Vitals:   07/18/22 0920 07/18/22 1314 07/18/22 2202 07/19/22 0545  BP: 98/62 111/70 113/63 115/67  Pulse: 90 97 92 80  Resp: 17 16 18 16   Temp:  98.9 F (37.2 C) 98.7 F (37.1 C) 98.5 F (36.9 C)  TempSrc:  Oral Oral Oral  SpO2: 100% 100% 100% 100%  Weight:      Height:         Intake/Output Summary (Last 24 hours) at 07/19/2022 1036 Last data filed at 07/19/2022 1000 Gross per 24 hour  Intake 1710 ml  Output 2405 ml  Net -695 ml   Filed Weights   07/11/22 1851 07/13/22 0814  Weight: 55.8 kg 55.8 kg    Examination:    General: Appearance:    Thin female in no acute distress     Lungs:     respirations unlabored  Heart:    Normal heart rate. Normal rhythm. No murmurs, rubs, or gallops.   MS:   All extremities are intact.   Neurologic:   Awake, alert     Data Reviewed: personally reviewed   CBC    Component Value Date/Time   WBC 7.2 07/19/2022 0502   RBC 2.93 (L) 07/19/2022 0502   HGB 7.8 (L) 07/19/2022 0502   HCT 25.1 (L) 07/19/2022 0502   PLT 394 07/19/2022 0502   MCV 85.7 07/19/2022 0502   MCH 26.6 07/19/2022 0502   MCHC 31.1 07/19/2022 0502   RDW 17.2 (H) 07/19/2022 0502   LYMPHSABS 1.5 07/11/2022 1856   MONOABS 1.1 (H) 07/11/2022 1856   EOSABS 0.2 07/11/2022 1856   BASOSABS 0.0 07/11/2022 1856      Latest Ref Rng & Units 07/19/2022    5:02 AM 07/18/2022    4:41 AM 07/17/2022    4:24 AM  CMP  Glucose 70 - 99 mg/dL 07/19/2022  675  916   BUN 6 - 20 mg/dL <5  <5  <5   Creatinine  0.44 - 1.00 mg/dL 0.47  <0.30  0.39   Sodium 135 - 145 mmol/L 140  138  138   Potassium 3.5 - 5.1 mmol/L 4.5  3.4  3.0   Chloride 98 - 111 mmol/L 110  108  106   CO2 22 - 32 mmol/L 26  25  27    Calcium 8.9 - 10.3 mg/dL 8.4  7.8  7.9      Radiology Studies: DG Chest 2 View  Result Date: 07/17/2022 CLINICAL DATA:  Hx of Crohns disease and diabetic. Patient reports congestion, no pain and ex-smoker. EXAM: CHEST - 2 VIEW COMPARISON:  Chest x-ray 07/11/2022. FINDINGS: Apparent air underneath the right hemidiaphragm. No consolidation. No visible pleural effusions pneumothorax. Cardiomediastinal silhouette is within normal limits. IMPRESSION: 1. Apparent air underneath the right hemidiaphragm. This is concerning for pneumoperitoneum, although interposed colon is a  possibility. Recommend left lateral decubitus abdominal radiograph to assess for pneumoperitoneum. 2. No acute cardiopulmonary disease. These results will be called to the ordering clinician or representative by the Radiologist Assistant, and communication documented in the PACS or [employer]. Electronically Signed   By: Margaretha Sheffield M.D.   On: 07/17/2022 11:48     Scheduled Meds:  Chlorhexidine Gluconate Cloth  6 each Topical Daily   enoxaparin (LOVENOX) injection  40 mg Subcutaneous Q24H   insulin aspart  0-15 Units Subcutaneous TID WC   insulin aspart  0-5 Units Subcutaneous QHS   methocarbamol  1,000 mg Oral TID   [START ON 07/20/2022] pantoprazole  40 mg Oral Daily   polycarbophil  625 mg Oral Daily   Continuous Infusions:     LOS: 8 days   Time spent: [place], DO [facility] To contact the attending provider between 7A-7P or the covering provider during after hours 7P-7A, please log into the web site [email or web address] and access using universal [place] password for that web site. If you do not have the password, please call the hospital operator.  07/19/2022, 10:36 AM

## 2022-07-19 NOTE — Progress Notes (Signed)
Mobility Specialist Cancellation/Refusal Note:    07/19/22 1147  Mobility  Activity Refused mobility     Reason for Cancellation/Refusal: Pt declined mobility at this time.Pt sleeping at this time.Will check back as schedule permits.      [facility]

## 2022-07-19 NOTE — Progress Notes (Signed)
6 Days Post-Op  Subjective: CC: Patient with lower abdominal pain and pain around her incision. Well controlled with medications and slightly better from yesterday but still requiring IV pain medication. Tolerating diet without n/v. Having ostomy output. Teaching session with wocn yesterday. Emptying her ostomy bag herself. Reports she is mobilizing well - mobility tech's note reports 222ft mod ind. Voiding but having dysuria.   Objective: Vital signs in last 24 hours: Temp:  [98.5 F (36.9 C)-98.9 F (37.2 C)] 98.5 F (36.9 C) (09/21 0545) Pulse Rate:  [80-97] 80 (09/21 0545) Resp:  [16-18] 16 (09/21 0545) BP: (111-115)/(63-70) 115/67 (09/21 0545) SpO2:  [100 %] 100 % (09/21 0545) Last BM Date : 07/19/22  Intake/Output from previous day: 09/20 0701 - 09/21 0700 In: 1710 [P.O.:1440; IV Piggyback:270] Out: 1905 [Urine:800; Stool:1105] Intake/Output this shift: No intake/output data recorded.  PE: Gen:  Alert, NAD, pleasant Card:  Reg  Pulm: Normal rate and effort.  Abd: Soft, ND distension, some lower abdominal ttp as well as around her incision. No rigidity or guarding. Stoma pink, budded and viable. Thin stool output in ostomy bag. Midline wound with staples in place, cdi Ext:  No LE edema Psych: A&Ox3   Lab Results:  Recent Labs    07/18/22 0441 07/19/22 0502  WBC 6.7 7.2  HGB 7.2* 7.8*  HCT 22.6* 25.1*  PLT 330 394   BMET Recent Labs    07/18/22 0441 07/19/22 0502  NA 138 140  K 3.4* 4.5  CL 108 110  CO2 25 26  GLUCOSE 113* 115*  BUN <5* <5*  CREATININE <0.30* 0.47  CALCIUM 7.8* 8.4*   PT/INR No results for input(s): "LABPROT", "INR" in the last 72 hours. CMP     Component Value Date/Time   NA 140 07/19/2022 0502   K 4.5 07/19/2022 0502   CL 110 07/19/2022 0502   CO2 26 07/19/2022 0502   GLUCOSE 115 (H) 07/19/2022 0502   BUN <5 (L) 07/19/2022 0502   CREATININE 0.47 07/19/2022 0502   CALCIUM 8.4 (L) 07/19/2022 0502   PROT 6.2 (L) 07/11/2022  1856   ALBUMIN 2.8 (L) 07/11/2022 1856   AST 9 (L) 07/11/2022 1856   ALT 9 07/11/2022 1856   ALKPHOS 58 07/11/2022 1856   BILITOT 0.4 07/11/2022 1856   GFRNONAA >60 07/19/2022 0502   GFRAA >60 08/28/2015 2150   Lipase     Component Value Date/Time   LIPASE 26 06/01/2022 1529    Studies/Results: DG Chest 2 View  Result Date: 07/17/2022 CLINICAL DATA:  Hx of Crohns disease and diabetic. Patient reports congestion, no pain and ex-smoker. EXAM: CHEST - 2 VIEW COMPARISON:  Chest x-ray 07/11/2022. FINDINGS: Apparent air underneath the right hemidiaphragm. No consolidation. No visible pleural effusions pneumothorax. Cardiomediastinal silhouette is within normal limits. IMPRESSION: 1. Apparent air underneath the right hemidiaphragm. This is concerning for pneumoperitoneum, although interposed colon is a possibility. Recommend left lateral decubitus abdominal radiograph to assess for pneumoperitoneum. 2. No acute cardiopulmonary disease. These results will be called to the ordering clinician or representative by the Radiologist Assistant, and communication documented in the PACS or [employer]. Electronically Signed   By: Feliberto Harts M.D.   On: 07/17/2022 11:48    Anti-infectives: Anti-infectives (From admission, onward)    Start     Dose/Rate Route Frequency Ordered Stop   07/13/22 1130  cefOXitin (MEFOXIN) 2 g in dextrose 5 % 50 mL IVPB        2 g 100  mL/hr over 30 Minutes Intravenous  Once 07/13/22 1119 07/13/22 1108   07/13/22 1107  sodium chloride 0.9 % with cefoTEtan (CEFOTAN) ADS Med       Note to Pharmacy: Randa Evens D: cabinet override      07/13/22 1107 07/13/22 2314   07/13/22 0900  sulfamethoxazole-trimethoprim (BACTRIM DS) 800-160 MG per tablet 1 tablet  Status:  Discontinued        1 tablet Oral Once per day on Mon Wed Fri 07/11/22 2257 07/14/22 1125   07/12/22 0400  piperacillin-tazobactam (ZOSYN) IVPB 3.375 g  Status:  Discontinued        3.375 g 12.5 mL/hr  over 240 Minutes Intravenous Every 8 hours 07/11/22 2026 07/17/22 0828   07/11/22 2030  piperacillin-tazobactam (ZOSYN) IVPB 3.375 g        3.375 g 100 mL/hr over 30 Minutes Intravenous  Once 07/11/22 2026 07/11/22 2208        Assessment/Plan POD 6 s/p Ex Lap, R hemicolectomy, End Ileostomy, LOA x 90 min for Perforating ileocolic Crohn's disease by Dr. Dema Severin on 07/13/22 - Path c/w Crohn's enteritis, no dysplasia or malignancy  - Comp 5d abx. Afebrile. WBC wnl - Cont Reg diet.  - Ostomy output improved at ~1L after adding fiber. Monitor.  - Maximize non-narcotics. PRN oxy. Wean IV pain meds.  - WOCN following for new ostomy and teaching. Will refer to ostomy clinic at discharge. TOC consult for [facility] RN.  - Mobilize in halls - Pulm toilet - Bilateral ureteral stents placed pre-op. Plan follow up with Urology. Foley out and voiding.    FEN - Reg, IVF per [facility] VTE - SCDs, Lovenox (hgb stable at 7.8) ID - Zosyn 9/13 - 9/19 Foley - Out and voiding. UA for dysuria.    - Per [facility] -  IDDM    LOS: 8 days    Jillyn Ledger , [facility] 07/19/2022, 9:53 AM Please see Amion for pager number during day hours 7:00am-4:30pm

## 2022-07-20 DIAGNOSIS — K50014 Crohn's disease of small intestine with abscess: Secondary | ICD-10-CM | POA: Diagnosis not present

## 2022-07-20 LAB — GLUCOSE, CAPILLARY
Glucose-Capillary: 108 mg/dL — ABNORMAL HIGH (ref 70–99)
Glucose-Capillary: 133 mg/dL — ABNORMAL HIGH (ref 70–99)

## 2022-07-20 MED ORDER — CALCIUM POLYCARBOPHIL 625 MG PO TABS: 625.0000 mg | ORAL_TABLET | Freq: Every day | ORAL | 0 refills | Status: DC

## 2022-07-20 MED ORDER — ONDANSETRON HCL 4 MG PO TABS: 4.0000 mg | ORAL_TABLET | Freq: Every day | ORAL | 0 refills | Status: DC | PRN

## 2022-07-20 MED ORDER — OXYCODONE HCL 5 MG PO TABS: 5.0000 mg | ORAL_TABLET | Freq: Four times a day (QID) | ORAL | 0 refills | Status: DC | PRN

## 2022-07-20 NOTE — Discharge Instructions (Signed)
[facility]      [facility] [phone]  OPEN ABDOMINAL SURGERY: POST OP INSTRUCTIONS  Always review your discharge instruction sheet given to you by the facility where your surgery was performed.  IF YOU HAVE DISABILITY OR FAMILY LEAVE FORMS, YOU MUST BRING THEM TO THE OFFICE FOR PROCESSING.  PLEASE DO NOT GIVE THEM TO YOUR DOCTOR.  A prescription for pain medication may be given to you upon discharge.  Take your pain medication as prescribed, if needed.  If narcotic pain medicine is not needed, then you may take acetaminophen (Tylenol) or ibuprofen (Advil) as needed. Take your usually prescribed medications unless otherwise directed. If you need a refill on your pain medication, please contact your pharmacy. They will contact our office to request authorization.  Prescriptions will not be filled after 5pm or on week-ends. You should follow a light diet the first few days after arrival home, such as soup and crackers, pudding, etc.unless your doctor has advised otherwise. A high-fiber, low fat diet can be resumed as tolerated.   Be sure to include lots of fluids daily. Most patients will experience some swelling and bruising on the chest and neck area.  Ice packs will help.  Swelling and bruising can take several days to resolve Most patients will experience some swelling and bruising in the area of the incision. Ice pack will help. Swelling and bruising can take several days to resolve..  It is common to experience some constipation if taking pain medication after surgery.  Increasing fluid intake and taking a stool softener will usually help or prevent this problem from occurring.  A mild laxative (Milk of Magnesia or Miralax) should be taken according to package directions if there are no bowel movements after 48 hours.  You may have steri-strips (small skin tapes) in place directly over the incision.  These strips should be left on the skin for 7-10 days.  If your surgeon used skin  glue on the incision, you may shower in 24 hours.  The glue will flake off over the next 2-3 weeks.  Any sutures or staples will be removed at the office during your follow-up visit. You may find that a light gauze bandage over your incision may keep your staples from being rubbed or pulled. You may shower and replace the bandage daily. ACTIVITIES:  You may resume regular (light) daily activities beginning the next day--such as daily self-care, walking, climbing stairs--gradually increasing activities as tolerated.  You may have sexual intercourse when it is comfortable.  Refrain from any heavy lifting or straining until approved by your doctor. You may drive when you no longer are taking prescription pain medication, you can comfortably wear a seatbelt, and you can safely maneuver your car and apply brakes Return to Work: ___________________________________ You should see your doctor in the office for a follow-up appointment approximately two weeks after your surgery.  Make sure that you call for this appointment within a day or two after you arrive home to insure a convenient appointment time. OTHER INSTRUCTIONS:  _____________________________________________________________ _____________________________________________________________  WHEN TO CALL YOUR DOCTOR: Fever over 101.0 Inability to urinate Nausea and/or vomiting Extreme swelling or bruising Continued bleeding from incision. Increased pain, redness, or drainage from the incision. Difficulty swallowing or breathing Muscle cramping or spasms. Numbness or tingling in hands or feet or around lips.  The clinic staff is available to answer your questions during regular business hours.  Please don't hesitate to call and ask to speak to one of   the nurses if you have concerns.  For further questions, please visit [email or web address]  

## 2022-07-20 NOTE — Progress Notes (Signed)
7 Days Post-Op  Subjective: CC: Feels much better.  Only some soreness around her incision.  Off IV pain medication.  Pain well controlled with p.o. medications.  Tolerating diet without nausea or vomiting.  Output from ostomy improved (275cc/24 hours).  Reports she just changed her own ostomy bag and has been emptying it herself.  Mobilizing in the halls with staff.  Voiding.  Wants go home.  Objective: Vital signs in last 24 hours: Temp:  [98.1 F (36.7 C)-98.6 F (37 C)] 98.2 F (36.8 C) (09/22 0547) Pulse Rate:  [85-95] 85 (09/22 0547) Resp:  [16-18] 16 (09/22 0547) BP: (98-106)/(61-64) 104/62 (09/22 0547) SpO2:  [94 %-100 %] 99 % (09/22 0547) Last BM Date : 07/20/22  Intake/Output from previous day: 09/21 0701 - 09/22 0700 In: [place] [P.O.:1720] Out: 3075 [Urine:2800; Stool:275] Intake/Output this shift: Total I/O In: 360 [P.O.:360] Out: 650 [Urine:500; Stool:150]  PE: Gen:  Alert, NAD, pleasant Card:  Reg  Pulm: Normal rate and effort.  Abd: Soft, ND distension, appropriately tender around her incision. No rigidity or guarding. Stoma pink, budded and viable. Thin stool output in ostomy bag. Midline wound with staples in place, cdi  Lab Results:  Recent Labs    07/18/22 0441 07/19/22 0502  WBC 6.7 7.2  HGB 7.2* 7.8*  HCT 22.6* 25.1*  PLT 330 394   BMET Recent Labs    07/18/22 0441 07/19/22 0502  NA 138 140  K 3.4* 4.5  CL 108 110  CO2 25 26  GLUCOSE 113* 115*  BUN <5* <5*  CREATININE <0.30* 0.47  CALCIUM 7.8* 8.4*   PT/INR No results for input(s): "LABPROT", "INR" in the last 72 hours. CMP     Component Value Date/Time   NA 140 07/19/2022 0502   K 4.5 07/19/2022 0502   CL 110 07/19/2022 0502   CO2 26 07/19/2022 0502   GLUCOSE 115 (H) 07/19/2022 0502   BUN <5 (L) 07/19/2022 0502   CREATININE 0.47 07/19/2022 0502   CALCIUM 8.4 (L) 07/19/2022 0502   PROT 6.2 (L) 07/11/2022 1856   ALBUMIN 2.8 (L) 07/11/2022 1856   AST 9 (L) 07/11/2022 1856    ALT 9 07/11/2022 1856   ALKPHOS 58 07/11/2022 1856   BILITOT 0.4 07/11/2022 1856   GFRNONAA >60 07/19/2022 0502   GFRAA >60 08/28/2015 2150   Lipase     Component Value Date/Time   LIPASE 26 06/01/2022 1529    Studies/Results: No results found.  Anti-infectives: Anti-infectives (From admission, onward)    Start     Dose/Rate Route Frequency Ordered Stop   07/13/22 1130  cefOXitin (MEFOXIN) 2 g in dextrose 5 % 50 mL IVPB        2 g 100 mL/hr over 30 Minutes Intravenous  Once 07/13/22 1119 07/13/22 1108   07/13/22 1107  sodium chloride 0.9 % with cefoTEtan (CEFOTAN) ADS Med       Note to Pharmacy: Randa Evens D: cabinet override      07/13/22 1107 07/13/22 2314   07/13/22 0900  sulfamethoxazole-trimethoprim (BACTRIM DS) 800-160 MG per tablet 1 tablet  Status:  Discontinued        1 tablet Oral Once per day on Mon Wed Fri 07/11/22 2257 07/14/22 1125   07/12/22 0400  piperacillin-tazobactam (ZOSYN) IVPB 3.375 g  Status:  Discontinued        3.375 g 12.5 mL/hr over 240 Minutes Intravenous Every 8 hours 07/11/22 2026 07/17/22 0828   07/11/22 2030  piperacillin-tazobactam (ZOSYN)  IVPB 3.375 g        3.375 g 100 mL/hr over 30 Minutes Intravenous  Once 07/11/22 2026 07/11/22 2208        Assessment/Plan POD 7 s/p Ex Lap, R hemicolectomy, End Ileostomy, LOA x 90 min for Perforating ileocolic Crohn's disease by Dr. Dema Severin on 07/13/22 - Path c/w Crohn's enteritis, no dysplasia or malignancy  - Comp 5d abx. Afebrile. WBC wnl - Cont Reg diet.  - Output improved on fiber. Discussed monitoring output at home, avoiding sugary drinks, continuing fiber and contacting our office with any issues.  - WOCN following for new ostomy and teaching. Patient did not qualify for [place] per TOC.  She was able to change and into her ostomy bag herself.  Will refer to ostomy clinic - Mobilize in halls - Pulm toilet - Bilateral ureteral stents placed pre-op. Plan follow up with Urology. Foley out and  voiding.  - Okay for discharge from our standpoint.  I will arrange follow-up.  Will send pain medications to her pharmacy. Discussed discharge instructions, restrictions and return/call back precautions.  Discussed with primary in person.    FEN - Reg, IVF per [facility] VTE - SCDs, Lovenox  ID - Zosyn 9/13 - 9/19 Foley - Out and voiding.   - Per [facility] -  IDDM    LOS: 9 days    Jillyn Ledger , [facility] 07/20/2022, 11:36 AM Please see Amion for pager number during day hours 7:00am-4:30pm

## 2022-07-20 NOTE — Progress Notes (Signed)
Mobility Specialist - Progress Note   07/20/22 1131  Mobility  HOB Elevated/Bed Position Self regulated  Activity Ambulated with assistance in hallway  Range of Motion/Exercises Active  Level of Assistance Modified independent, requires aide device or extra time  Assistive Device Front wheel walker  Distance Ambulated (ft) 250 ft  Activity Response Tolerated well  Transport method Ambulatory  $Mobility charge 1 Mobility   Pt received in bathroom and agreeable to mobility.  Pt to recliner after session with all needs met.    [facility]

## 2022-07-20 NOTE — Progress Notes (Signed)
Patient was given discharge instructions, and all questions were answered.  Patient was stable for discharge and was taken to the main exit by wheelchair. 

## 2022-07-20 NOTE — Consult Note (Addendum)
[place] Nurse ostomy follow up Patient receiving care in [phone] Due to be discharged today Stoma type/location: RLQ ileostomy Stomal assessment/size: 1 1/2 inch just above the skin level Peristomal assessment: intact Treatment options for stomal/peristomal skin: barrier ring Output: thin green about 150 cc Ostomy pouching: 1pc. Flexible convex pouch with barrier ring Education provided: patient was able to perform entire pouch change today and feels better about doing care herself at home now.  Enrolled patient in [place] Start Discharge program: Yes previously Patient may call [phone] with questions after discharge.  Here are some tips to help control odor:  Ensure that the skin barrier is securely sealed to the skin.  Empty the pouch frequently.  Place specialized deodorants (liquid or tablet form) in the pouch.  Use air deodorizers when emptying the pouch; these effectively control odor during this process. Www.ostomy.org   I recommend/discussed the [facility] outpatient ostomy clinic as an outpatient resource.  IF MD agrees this would be beneficial, please fax referral, or enter electronically in Epic the referral. (Fax- [phone])   Jocelyn Lamer L. Tamala Julian, MSN, RN, [place], Lysle Pearl, [facility] Wound Treatment Associate Pager [phone]

## 2022-07-20 NOTE — Discharge Summary (Signed)
Physician Discharge Summary  ISLAND DOHMEN RVI:[id number] DOB: [birth year] DOA: 07/11/2022  PCP: Jorge Ny, PA-C  Admit date: 07/11/2022 Discharge date: 07/20/2022  Admitted From: home Discharge disposition: home   Recommendations for Outpatient Follow-Up:   Referral to outpatient ostomy clinic Monitor blood sugar-- may need insulin restarted if blood sugars high Cbc. Bmp in 10 days   Discharge Diagnosis:   Principal Problem:   Crohn's disease of ileum, with abscess ([place]) Active Problems:   Hydroureteronephrosis   Insulin-requiring or dependent type II diabetes mellitus ([place])   Need for pneumocystis prophylaxis   Hypokalemia    Discharge Condition: Improved.  Diet recommendation:  Carbohydrate-modified  Wound care: None.  Code status: Full.   History of Present Illness:   Sandra Foster is a 31 y.o. female with medical history significant of IDDM, Crohn's disease.  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  This has progressively worsened and she has required 4 prior admits (5 including today) since June 2023.  She has developed abscess as well as inflammation causing extrensic compression of ureters and B hydronephrosis.   Attempt has been made to treat non-operatively with ABx during prior admits, as well as steroids + Remicaid started during admit 1 month ago.   Today will be 5th admit since June, continues to worsen / progress despite initially steroids, and more recently ABx + IR drainage last month followed by Remicaid and prednisone during admit 8/14-19.  Off of prednisone at this point.  Was supposed to continue Remicaid but only had the 1 dose in hospital 1 month ago since unable to be seen in GI office on 9/6 due to medicaid being cut off (has since been reinstated).  Has been on continuous ABx since that time, just saw Dr. Baxter Flattery in [place] [facility] x2 days ago).   Hospital Course by Problem:    Crohn disease with worsening + recurrent abscesses and multiple admissions Status post surgery Dr. Dema Severin 9/15  Pain control Diet advanced Zosyn given for 6 days and discontinued on 9/18 -home with outpatient follow up   DM TY 2  -BS in hopsital controlled with SSI -since no longer on steroids, will d/c insulin-- will see if diet controlled   Leukocytosis -resolved   Anemia of expected of blood loss from surgery -dose of IV Fe -outpatient follow up   Mild hypokalemia -repleted  Insertion of bilateral ureteral stents -urology consult internalized stents are temporary and will need to be removed (ideally I noffice about 2-3 weeks post-op).   Medical Consultants:    [facility]  Discharge Exam:   Vitals:   07/19/22 2120 07/20/22 0547  BP: 106/64 104/62  Pulse: 95 85  Resp: 18 16  Temp: 98.1 F (36.7 C) 98.2 F (36.8 C)  SpO2: 94% 99%   Vitals:   07/19/22 0545 07/19/22 1411 07/19/22 2120 07/20/22 0547  BP: 115/67 98/61 106/64 104/62  Pulse: 80 94 95 85  Resp: _0 Temp: 98.5 F (36.9 C) 98.6 F (37 C) 98.1 F (36.7 C) 98.2 F (36.8 C)  TempSrc: Oral Oral    SpO2: 100% 100% 94% 99%  Weight:      Height:        General exam: Appears calm and comfortable.    The results of significant diagnostics from this hospitalization (including imaging, microbiology, ancillary and laboratory) are listed below for reference.     Procedures and Diagnostic  Studies:   DG Chest 2 View  Result Date: 07/11/2022 CLINICAL DATA:  Fever; suspected sepsis EXAM: CHEST - 2 VIEW COMPARISON:  Radiographs 06/11/2022 FINDINGS: No focal consolidation, pleural effusion, or pneumothorax. Normal cardiomediastinal silhouette. No acute osseous abnormality. IMPRESSION: No active cardiopulmonary disease. Electronically Signed   By: Placido Sou M.D.   On: 07/11/2022 20:04   CT Abdomen Pelvis W Contrast  Result Date: 07/11/2022 CLINICAL DATA:  Abdomen pain fever EXAM: CT  ABDOMEN AND PELVIS WITH CONTRAST TECHNIQUE: Multidetector CT imaging of the abdomen and pelvis was performed using the standard protocol following bolus administration of intravenous contrast. RADIATION DOSE REDUCTION: This exam was performed according to the departmental dose-optimization program which includes automated exposure control, adjustment of the mA and/or kV according to patient size and/or use of iterative reconstruction technique. CONTRAST:  180m OMNIPAQUE IOHEXOL 300 MG/ML  SOLN COMPARISON:  CT 06/28/2022, 06/11/2022, 05/03/2022 FINDINGS: Lower chest: Lung bases demonstrate no acute airspace disease. Hepatobiliary: No focal liver abnormality is seen. No gallstones, gallbladder wall thickening, or biliary dilatation. Pancreas: Unremarkable. No pancreatic ductal dilatation or surrounding inflammatory changes. Spleen: Normal in size without focal abnormality. Adrenals/Urinary Tract: Adrenal glands are within normal limits. Prominent right extrarenal pelvis, mildly increased compared to prior. Moderate dilatation of left extrarenal pelvis and ureter, increased compared to most recent prior. Urinary bladder is unremarkable. Stomach/Bowel: The stomach is nondistended. Abnormal thickened small bowel loops with mucosal enhancement in the right lower quadrant worse as compared with 06/28/2022. wall thickening and inflammation at the cecum. Vascular/Lymphatic: Nonaneurysmal aorta. No increased adenopathy. Retroaortic left renal vein. Reproductive: Uterus and bilateral adnexa are unremarkable. Other: Right pelvic drainage catheter remains in place with small gas and fluid collection surrounding the pigtail. Interval irregular gas and fluid collection in the right lower quadrant measuring about 2.9 x 1.9 cm, series 2, image 50. Increased mildly loculated appearing pelvic effusions. No internal gas bubbles. Musculoskeletal: No acute or significant osseous findings. IMPRESSION: 1. Considerable inflammatory process  in the right lower quadrant mostly involving distal small bowel, suspect for active inflammation/Crohn's. Inflammatory changes appear worse as compared with the CT from 06/28/2022. Right lower quadrant drainage catheter remains in place with small residual gas and fluid collection around the pigtail. Interim finding of small irregular gas and fluid collection in the right lower quadrant measuring up to 2.9 cm, this may reflect recurrent abscess or small area of contained perforation. Increased mildly loculated fluid within the pelvis. 2. Increased left hydronephrosis and hydroureter without obstructing stone. Slight increased dilatation of right extrarenal pelvis. Slightly thick-walled appearance of the urinary bladder, possible cystitis. Electronically Signed   By: KDonavan FoilM.D.   On: 07/11/2022 20:03     Labs:   Basic Metabolic Panel: Recent Labs  Lab 07/15/22 0454 07/16/22 0417 07/17/22 0424 07/18/22 0441 07/19/22 0502  NA 138 141 138 138 140  K 3.4* 3.0* 3.0* 3.4* 4.5  CL 107 109 106 108 110  CO2 _0 GLUCOSE 88 115* 118* 113* 115*  BUN 6 <5* <5* <5* <5*  CREATININE 0.47 0.39* 0.39* <0.30* 0.47  CALCIUM 7.9* 7.9* 7.9* 7.8* 8.4*  MG  --   --  1.5*  --   --    GFR Estimated Creatinine Clearance: 90.6 mL/min (by C-G formula based on SCr of 0.47 mg/dL). Liver Function Tests: No results for input(s): "AST", "ALT", "ALKPHOS", "BILITOT", "PROT", "ALBUMIN" in the last 168 hours. No results for input(s): "LIPASE", "AMYLASE" in the last 168 hours.  No results for input(s): "AMMONIA" in the last 168 hours. Coagulation profile No results for input(s): "INR", "PROTIME" in the last 168 hours.  CBC: Recent Labs  Lab 07/15/22 0454 07/16/22 0417 07/17/22 0424 07/18/22 0441 07/19/22 0502  WBC 10.0 9.6 8.5 6.7 7.2  HGB 7.8* 7.4* 7.3* 7.2* 7.8*  HCT 24.6* 23.5* 22.7* 22.6* 25.1*  MCV 85.4 84.8 84.1 84.6 85.7  PLT 290 304 317 330 394   Cardiac Enzymes: No results for  input(s): "CKTOTAL", "CKMB", "CKMBINDEX", "TROPONINI" in the last 168 hours. BNP: Invalid input(s): "POCBNP" CBG: Recent Labs  Lab 07/19/22 1152 07/19/22 1631 07/19/22 2123 07/20/22 0731 07/20/22 1146  GLUCAP 108* 166* 120* 108* 133*   D-Dimer No results for input(s): "DDIMER" in the last 72 hours. Hgb A1c No results for input(s): "HGBA1C" in the last 72 hours. Lipid Profile No results for input(s): "CHOL", "HDL", "LDLCALC", "TRIG", "CHOLHDL", "LDLDIRECT" in the last 72 hours. Thyroid function studies No results for input(s): "TSH", "T4TOTAL", "T3FREE", "THYROIDAB" in the last 72 hours.  Invalid input(s): "FREET3" Anemia work up Recent Labs    07/18/22 0441  TIBC 147*  IRON 12*   Microbiology Recent Results (from the past 240 hour(s))  Resp Panel by RT-PCR (Flu A&B, Covid)     Status: None   Collection Time: 07/11/22  6:56 PM   Specimen: Nasal Swab  Result Value Ref Range Status   SARS Coronavirus 2 by RT PCR NEGATIVE NEGATIVE Final    Comment: (NOTE) SARS-CoV-2 target nucleic acids are NOT DETECTED.  The SARS-CoV-2 RNA is generally detectable in upper respiratory specimens during the acute phase of infection. The lowest concentration of SARS-CoV-2 viral copies this assay can detect is 138 copies/mL. A negative result does not preclude SARS-Cov-2 infection and should not be used as the sole basis for treatment or other patient management decisions. A negative result may occur with  improper specimen collection/handling, submission of specimen other than nasopharyngeal swab, presence of viral mutation(s) within the areas targeted by this assay, and inadequate number of viral copies(<138 copies/mL). A negative result must be combined with clinical observations, patient history, and epidemiological information. The expected result is Negative.  Fact Sheet for Patients:  EntrepreneurPulse.com.au  Fact Sheet for Healthcare Providers:   IncredibleEmployment.be  This test is no t yet approved or cleared by the [place] FDA and  has been authorized for detection and/or diagnosis of SARS-CoV-2 by FDA under an Emergency Use Authorization (EUA). This EUA will remain  in effect (meaning this test can be used) for the duration of the COVID-19 declaration under Section 564(b)(1) of the Act, 21 U.S.C.section 360bbb-3(b)(1), unless the authorization is terminated  or revoked sooner.       Influenza A by PCR NEGATIVE NEGATIVE Final   Influenza B by PCR NEGATIVE NEGATIVE Final    Comment: (NOTE) The Xpert Xpress SARS-CoV-2/FLU/RSV plus assay is intended as an aid in the diagnosis of influenza from Nasopharyngeal swab specimens and should not be used as a sole basis for treatment. Nasal washings and aspirates are unacceptable for Xpert Xpress SARS-CoV-2/FLU/RSV testing.  Fact Sheet for Patients: EntrepreneurPulse.com.au  Fact Sheet for Healthcare Providers: IncredibleEmployment.be  This test is not yet approved or cleared by the [place] FDA and has been authorized for detection and/or diagnosis of SARS-CoV-2 by FDA under an Emergency Use Authorization (EUA). This EUA will remain in effect (meaning this test can be used) for the duration of the COVID-19 declaration under Section 564(b)(1) of the Act, 21 U.S.C.  section 360bbb-3(b)(1), unless the authorization is terminated or revoked.  Performed at [facility], [place] [address] [place], [place] [address]   Culture, blood (Routine x 2)     Status: None   Collection Time: 07/11/22  6:58 PM   Specimen: BLOOD  Result Value Ref Range Status   Specimen Description   Final    BLOOD BLOOD LEFT HAND Performed at [facility] [address] [place], [place] [address]    Special Requests   Final    BOTTLES DRAWN AEROBIC AND ANAEROBIC Blood Culture adequate volume Performed  at [facility] [address], [place], [place] [address]    Culture   Final    NO GROWTH 5 DAYS Performed at [facility], [place] [address], [place], [place] [address]    Report Status 07/16/2022 FINAL  Final  Urine Culture     Status: Abnormal   Collection Time: 07/11/22  6:58 PM   Specimen: In/Out Cath Urine  Result Value Ref Range Status   Specimen Description   Final    IN/OUT CATH URINE Performed at [facility] [address] [place], [place] [address]    Special Requests   Final    NONE Performed at [facility], [place] [address] [place], [place] [address]    Culture (A)  Final    10,000 COLONIES/mL LACTOBACILLUS SPECIES Standardized susceptibility testing for this organism is not available. Performed at [facility], [place] [address] [place], [place] [address]    Report Status 07/12/2022 FINAL  Final  Culture, blood (Routine x 2)     Status: None   Collection Time: 07/11/22  9:15 PM   Specimen: BLOOD  Result Value Ref Range Status   Specimen Description   Final    BLOOD SITE NOT SPECIFIED Performed at [facility] [address] [place], [place] [address]    Special Requests   Final    BLOOD Blood Culture results may not be optimal due to an excessive volume of blood received in culture bottles Performed at [facility] [address], [place], [place] [address]    Culture   Final    NO GROWTH 5 DAYS Performed at [facility], [place] [address], [place], [place] [address]    Report Status 07/16/2022 FINAL  Final  Surgical PCR screen     Status: None   Collection Time: 07/13/22  7:18 AM   Specimen: Nasal Mucosa; Nasal Swab  Result Value Ref Range Status   MRSA, PCR NEGATIVE NEGATIVE Final   Staphylococcus aureus NEGATIVE NEGATIVE Final    Comment: (NOTE) The Xpert SA Assay (FDA approved for NASAL specimens in patients 24 years of age and older), is  one component of a comprehensive surveillance program. It is not intended to diagnose infection nor to guide or monitor treatment. Performed at [facility], [place] [address], [place], [place] [address]      Discharge Instructions:   Discharge Instructions     Amb Referral to Ostomy Clinic   Complete by: As directed    Reason for referral modifiers: Pre and post-operative counseling for ostomy management   Diet Carb Modified   Complete by: As directed    Increase activity slowly   Complete by: As directed    Here are some tips to help control odor:  Ensure that the skin barrier is securely sealed to the skin.  Empty the pouch frequently.  Place specialized deodorants (liquid or tablet form) in the pouch (can ask for these from [facility] when they call you)  Use air deodorizers when emptying the pouch; these effectively control odor during this process  Monitor your blood sugars-- they have been controlled here-- if blood sugars consistently >150 may need to re-start your lantus   No wound care   Complete by: As directed       Allergies as of 07/20/2022       Reactions   Nsaids Other (See Comments)   Crohn's disease = NO NSAIDs        Medication List     STOP taking these medications    amoxicillin-clavulanate 875-125 MG tablet Commonly known as: AUGMENTIN   dicyclomine 10 MG capsule Commonly known as: BENTYL   Lantus SoloStar 100 UNIT/ML Solostar Pen Generic drug: insulin glargine   Pen Needles 30G X 5 MM Misc   polyethylene glycol 17 g packet Commonly known as: MIRALAX / GLYCOLAX   sulfamethoxazole-trimethoprim 800-160 MG tablet Commonly known as: BACTRIM DS       TAKE these medications    acetaminophen 500 MG tablet Commonly known as: TYLENOL Take 1 tablet (500 mg total) by mouth every 6 (six) hours as needed. What changed: reasons to take this   blood glucose meter kit and supplies Dispense based on patient and insurance  preference. Use up to four times daily as directed. (FOR ICD-10 E10.9, E11.9).   carboxymethylcellulose 0.5 % Soln Commonly known as: REFRESH PLUS Place 1 drop into both eyes daily as needed (dry eyes).   methocarbamol 500 MG tablet Commonly known as: ROBAXIN Take 500 mg by mouth 2 (two) times daily.   Nexplanon 68 MG Impl implant Generic drug: etonogestrel 1 each by Subdermal route once.   ondansetron 4 MG tablet Commonly known as: Zofran Take 1 tablet (4 mg total) by mouth daily as needed for nausea or vomiting.   oxyCODONE 5 MG immediate release tablet Commonly known as: Oxy IR/ROXICODONE Take 1-2 tablets (5-10 mg total) by mouth every 6 (six) hours as needed for breakthrough pain.   pantoprazole 40 MG tablet Commonly known as: PROTONIX Take 40 mg by mouth daily.   polycarbophil 625 MG tablet Commonly known as: FIBERCON Take 1 tablet (625 mg total) by mouth daily. Start taking on: July 21, 2022        Follow-up Information     [facility], [place] Follow up on 07/27/2022.   Specialty: General Surgery Why: 3pm. This is a nurse visit for staple removal. Please arrive 30 minutes prior to your appointment for paperwork. Please bring a copy of your photo ID and insurance card. Contact information: [place] [address] [place] [address] [phone]         Lucita Ferrara, PA-C Follow up on 08/06/2022.   Specialty: General Surgery Why: 130pm. Please arrive 30 minutes prior to your appointment for paperwork. Please bring a copy of your photo ID and insurance card. Contact information: [address] [place] [address] [phone]         Alexis Frock, MD. Schedule an appointment as soon as possible for a visit.   Specialty: Urology Contact information: [place] [address] [phone]         Carol Mamye, MD. Schedule an appointment as soon as possible for a visit.   Specialty:  Gastroenterology Contact information: [address] [place] [address]  [phone]                  Time coordinating discharge: 6mn  Signed:  JGeradine GirtDO  [facility] 07/20/2022, 12:37 PM

## 2022-07-24 ENCOUNTER — Encounter (HOSPITAL_COMMUNITY)
Admit: 2022-07-24 | Discharge: 2022-07-24 | Disposition: A | Discharge status: Home / Self Care | Payer: Medicaid Other | Attending: Physician Assistant | Admitting: Physician Assistant

## 2022-07-24 DIAGNOSIS — Z432 Encounter for attention to ileostomy: Secondary | ICD-10-CM | POA: Diagnosis not present

## 2022-07-24 DIAGNOSIS — Z01818 Encounter for other preprocedural examination: Secondary | ICD-10-CM | POA: Insufficient documentation

## 2022-07-24 DIAGNOSIS — Z932 Ileostomy status: Secondary | ICD-10-CM | POA: Diagnosis not present

## 2022-07-24 MED ORDER — DICYCLOMINE HCL 10 MG PO CAPS: 10.0000 mg | ORAL_CAPSULE | Freq: Three times a day (TID) | ORAL | 0 refills | Status: DC

## 2022-07-24 NOTE — Discharge Instructions (Signed)
Set up with supplies with [facility]

## 2022-07-24 NOTE — Progress Notes (Signed)
[facility]   Reason for visit:  RLQ ileostomy HPI:  Crohn's colitis with right hemi-colectomy and ileostomy Has cover over pouch today and states she is managing well at home.  ROS  Review of Systems  Gastrointestinal:        RLQ ileostomy  Skin: Negative.   Psychiatric/Behavioral: Negative.         Discussed life with an ostomy.  Answered questions regarding showering, intimacy and active lifestyle.    All other systems reviewed and are negative.  Vital signs:  BP 109/71   Pulse 87   Temp 98.1 F (36.7 C)   Resp 16   SpO2 99%  Exam:  Physical Exam Vitals reviewed.  Constitutional:      Appearance: Normal appearance.  Abdominal:     Palpations: Abdomen is soft.     Comments: RLQ ileostomy  Neurological:     Mental Status: She is alert and oriented to person, place, and time.  Psychiatric:        Mood and Affect: Mood normal.        Behavior: Behavior normal.     Stoma type/location:  RLQ ileostomy Stomal assessment/size:  1 1/8" slightly budded Peristomal assessment:  intact  Treatment options for stomal/peristomal skin: barrier ring and 1 piece convex pouch Output: liquid green stool Ostomy pouching: 1pc.convex with barrier ring, belt and pouch cover. Education provided:  reviewed twice weekly pouch changes and PRN leakage. Symptoms of blockage and interventions, such as a warm relaxing bath.  Discussed importance of staying hydrated and signs of dehydration such as dry mouth, dizziness, decreased urine output.      Impression/dx  ileostomy Discussion  See above  No issues at this time.  Plan  I will set her up with [facility] for ostomy supplies.  Sent home with samples today as well.     Visit time: 55 minutes.   Domenic Moras FNP-BC

## 2022-07-27 ENCOUNTER — Other Ambulatory Visit (HOSPITAL_COMMUNITY): Payer: Self-pay | Admitting: Nurse Practitioner

## 2022-07-27 DIAGNOSIS — Z432 Encounter for attention to ileostomy: Secondary | ICD-10-CM

## 2022-08-02 ENCOUNTER — Other Ambulatory Visit: Payer: Self-pay

## 2022-08-02 ENCOUNTER — Ambulatory Visit (INDEPENDENT_AMBULATORY_CARE_PROVIDER_SITE_OTHER): Payer: Medicaid Other | Admitting: Internal Medicine

## 2022-08-02 DIAGNOSIS — K50914 Crohn's disease, unspecified, with abscess: Secondary | ICD-10-CM | POA: Diagnosis present

## 2022-08-02 NOTE — Progress Notes (Signed)
RFV: follow up for hospitalization  Phone visit. 2 identifier used Patient = at home Provider =at clinic  Patient ID: Sandra Foster, female   DOB: 02/19/1991, 31 y.o.   MRN: [id number]  HPI  Re-admitted on 9/13 for abd pain and fevers found to have worsening disease then opted for hemicolectomy where she underwent POD 7 s/p Ex Lap, R hemicolectomy, End Ileostomy, LOA x 90 min for Perforating ileocolic Crohn's disease by Dr. Dema Severin on 07/13/22- did short course of iv abtx. Discharged on 9/22 without further need for antibiotics.   Eating okay, needs to chew food thoroughly. Staples removed 4 days ago. Doing well, had 1 spot that bled now better. Stool output is normal/mushy. <337mL. Sees Dr white next week for follow-up. Looking to switch GI due to need to restart remicaide Outpatient Encounter Medications as of 08/02/2022  Medication Sig   acetaminophen (TYLENOL) 500 MG tablet Take 1 tablet (500 mg total) by mouth every 6 (six) hours as needed. (Patient taking differently: Take 500 mg by mouth every 6 (six) hours as needed for mild pain.)   blood glucose meter kit and supplies Dispense based on patient and insurance preference. Use up to four times daily as directed. (FOR ICD-10 E10.9, E11.9).   carboxymethylcellulose (REFRESH PLUS) 0.5 % SOLN Place 1 drop into both eyes daily as needed (dry eyes).   etonogestrel (NEXPLANON) 68 MG IMPL implant 1 each by Subdermal route once.   methocarbamol (ROBAXIN) 500 MG tablet Take 500 mg by mouth 2 (two) times daily.   ondansetron (ZOFRAN) 4 MG tablet Take 1 tablet (4 mg total) by mouth daily as needed for nausea or vomiting.   oxyCODONE (OXY IR/ROXICODONE) 5 MG immediate release tablet Take 1-2 tablets (5-10 mg total) by mouth every 6 (six) hours as needed for breakthrough pain.   pantoprazole (PROTONIX) 40 MG tablet Take 40 mg by mouth daily.   polycarbophil (FIBERCON) 625 MG tablet Take 1 tablet (625 mg total) by mouth daily.   dicyclomine  (BENTYL) 10 MG capsule Take 1 capsule (10 mg total) by mouth 3 (three) times daily before meals.   [DISCONTINUED] amitriptyline (ELAVIL) 25 MG tablet Take 1-2 tablets (25-50 mg total) by mouth at bedtime as needed (headache).   No facility-administered encounter medications on file as of 08/02/2022.     Patient Active Problem List   Diagnosis Date Noted   Ileostomy care [facility])    Intra-abdominal abscess ([place]) 06/22/2022   Need for pneumocystis prophylaxis 06/22/2022   Malnutrition of moderate degree 06/13/2022   Right leg pain 06/12/2022   SBO (small bowel obstruction) ([place]) 06/12/2022   Hydroureteronephrosis 06/12/2022   Thrombocytosis 06/12/2022   Sepsis ([place]) 06/01/2022   Abnormal weight loss 05/28/2022   Gastroesophageal reflux disease 05/28/2022   Acute Crohn's disease ([place]) 05/03/2022   Iron deficiency anemia 04/24/2022   Normocytic anemia 04/23/2022   Hypotension 04/23/2022   Uncontrolled NIDDM-2 with hyperglycemia 04/23/2022   Crohn's disease of ileum, with abscess ([place]) 04/22/2022   Tobacco abuse 04/22/2022   Dehydration 01/09/2022   Fistula of intestine, 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   Abdominal pain, acute, right lower quadrant 01/08/2022   Hypokalemia 01/08/2022   Diarrhea 01/08/2022   NVD (normal vaginal delivery) 09/13/2015   Diabetes in undelivered pregnancy 08/30/2015   Insulin-requiring or dependent type II diabetes mellitus ([place]) 08/29/2015   [redacted] weeks gestation of pregnancy    Prior pregnancy with fetal demise and current pregnancy in second trimester    Encounter for fetal  anatomic survey      Health Maintenance Due  Topic Date Due   COVID-19 Vaccine (1) Never done   FOOT EXAM  Never done   OPHTHALMOLOGY EXAM  Never done   Diabetic kidney evaluation - Urine ACR  Never done   Hepatitis C Screening  Never done   PAP SMEAR-Modifier  03/29/2018   INFLUENZA VACCINE  Never done     Physical Exam   Fluid speech does not appear to be in  discomfort   CBC Lab Results  Component Value Date   WBC 7.2 07/19/2022   RBC 2.93 (L) 07/19/2022   HGB 7.8 (L) 07/19/2022   HCT 25.1 (L) 07/19/2022   PLT 394 07/19/2022   MCV 85.7 07/19/2022   MCH 26.6 07/19/2022   MCHC 31.1 07/19/2022   RDW 17.2 (H) 07/19/2022   LYMPHSABS 1.5 07/11/2022   MONOABS 1.1 (H) 07/11/2022   EOSABS 0.2 07/11/2022    Assessment and Plan Crohn's disease  with intra-abdominal abscess = now resolved since surgery and colectomy. Still needs remicaide management but trying to change providers  No further abtx needed  Rtc in needed  Spent 10 min with patient, non face to face time.

## 2022-08-07 ENCOUNTER — Ambulatory Visit (HOSPITAL_COMMUNITY): Payer: Medicaid Other | Admitting: Nurse Practitioner

## 2022-08-10 ENCOUNTER — Ambulatory Visit (HOSPITAL_COMMUNITY): Payer: Medicaid Other | Admitting: Nurse Practitioner

## 2022-08-13 ENCOUNTER — Ambulatory Visit: Payer: Medicaid Other | Admitting: Registered"

## 2022-08-27 ENCOUNTER — Other Ambulatory Visit (HOSPITAL_COMMUNITY): Payer: Self-pay

## 2022-09-12 ENCOUNTER — Encounter (HOSPITAL_COMMUNITY): Payer: Self-pay | Admitting: Nurse Practitioner

## 2022-12-21 ENCOUNTER — Ambulatory Visit: Payer: Self-pay | Admitting: Surgery

## 2022-12-21 DIAGNOSIS — Z01818 Encounter for other preprocedural examination: Secondary | ICD-10-CM

## 2022-12-21 NOTE — Progress Notes (Signed)
Sent message, via epic in basket, requesting orders in epic from surgeon.  

## 2022-12-26 NOTE — Patient Instructions (Addendum)
SURGICAL WAITING ROOM VISITATION Patients having surgery or a procedure may have no more than 2 support people in the waiting area - these visitors may rotate.    If the patient needs to stay at the hospital during part of their recovery, the visitor guidelines for inpatient rooms apply. Pre-op nurse will coordinate an appropriate time for 1 support person to accompany patient in pre-op.  This support person may not rotate.    Please refer to the [facility] website for the visitor guidelines for Inpatients (after your surgery is over and you are in a regular room).   Due to an increase in RSV and influenza rates and associated hospitalizations, children ages 23 and under may not visit patients in [place].     Your procedure is scheduled on: 01-09-23   Report to [facility] Main Entrance    Report to admitting at 10:45  AM   Call this number if you have problems the morning of surgery [phone]  Follow a clear liquid diet the day before surgery    After Midnight you may have the following liquids until 10:00 AM DAY OF SURGERY  Water Non-Citrus Juices (without pulp, NO RED) Carbonated Beverages Black Coffee (NO MILK/CREAM OR CREAMERS, sugar ok)  Clear Tea (NO MILK/CREAM OR CREAMERS, sugar ok) regular and decaf                             Plain Jell-O (NO RED)                                           Fruit ices (not with fruit pulp, NO RED)                                     Popsicles (NO RED)                                                               Sports drinks like Gatorade (NO RED)   Drink 2 Pre-surgery G2 the evening before surgery (have completed by 10 PM)                   The day of surgery:  Drink ONE (1) Pre-Surgery G2 at 10:00  AM the morning of surgery. Drink in one sitting. Do not sip.  This drink was given to you during your hospital  pre-op appointment visit. Nothing else to drink after completing the Pre-Surgery Clear Ensure or  G2.          If you have questions, please contact your surgeon's office.   FOLLOW BOWEL PREP AND ANY ADDITIONAL PRE OP INSTRUCTIONS YOU RECEIVED FROM YOUR SURGEON'S OFFICE!!!     Oral Hygiene is also important to reduce your risk of infection.                                    Remember - BRUSH YOUR TEETH THE MORNING OF SURGERY WITH YOUR REGULAR TOOTHPASTE  Do NOT smoke after Midnight   Take these medicines the morning of surgery with A SIP OF WATER:   Tylenol if needed                              You may not have any metal on your body including hair pins, jewelry, and body piercing             Do not wear make-up, lotions, powders, perfumes or deodorant  Do not wear nail polish including gel and S&S, artificial/acrylic nails, or any other type of covering on natural nails including finger and toenails. If you have artificial nails, gel coating, etc. that needs to be removed by a nail salon please have this removed prior to surgery or surgery may need to be canceled/ delayed if the surgeon/ anesthesia feels like they are unable to be safely monitored.   Do not shave  48 hours prior to surgery.    Do not bring valuables to the hospital. [employer].   Contacts, dentures or bridgework may not be worn into surgery.   Bring small overnight bag day of surgery.   DO NOT [place]. PHARMACY WILL DISPENSE MEDICATIONS LISTED ON YOUR MEDICATION LIST TO YOU DURING YOUR ADMISSION Sandra Foster!               Please read over the following fact sheets you were given: IF Sandra Foster  If you received a COVID test during your pre-op visit  it is requested that you wear a mask when out in public, stay away from anyone that may not be feeling well and notify your surgeon if you develop symptoms. If you test positive for Covid or have been in contact with  anyone that has tested positive in the last 10 days please notify you surgeon.  [place] - Preparing for Surgery Before surgery, you can play an important role.  Because skin is not sterile, your skin needs to be as free of germs as possible.  You can reduce the number of germs on your skin by washing with CHG (chlorahexidine gluconate) soap before surgery.  CHG is an antiseptic cleaner which kills germs and bonds with the skin to continue killing germs even after washing. Please DO NOT use if you have an allergy to CHG or antibacterial soaps.  If your skin becomes reddened/irritated stop using the CHG and inform your nurse when you arrive at Short Stay. Do not shave (including legs and underarms) for at least 48 hours prior to the first CHG shower.  You may shave your face/neck.  Please follow these instructions carefully:  1.  Shower with CHG Soap the night before surgery and the  morning of surgery.  2.  If you choose to wash your hair, wash your hair first as usual with your normal  shampoo.  3.  After you shampoo, rinse your hair and body thoroughly to remove the shampoo.                             4.  Use CHG as you would any other liquid soap.  You can apply chg directly to the skin and wash.  Gently with a scrungie or clean washcloth.  5.  Apply the CHG Soap  to your body ONLY FROM THE NECK DOWN.   Do   not use on face/ open                           Wound or open sores. Avoid contact with eyes, ears mouth and   genitals (private parts).                       Wash face,  Genitals (private parts) with your normal soap.             6.  Wash thoroughly, paying special attention to the area where your    surgery  will be performed.  7.  Thoroughly rinse your body with warm water from the neck down.  8.  DO NOT shower/wash with your normal soap after using and rinsing off the CHG Soap.                9.  Pat yourself dry with a clean towel.            10.  Wear clean pajamas.            11.   Place clean sheets on your bed the night of your first shower and do not  sleep with pets. Day of Surgery : Do not apply any lotions/deodorants the morning of surgery.  Please wear clean clothes to the hospital/surgery center.  FAILURE TO FOLLOW THESE INSTRUCTIONS MAY RESULT IN THE CANCELLATION OF YOUR SURGERY  PATIENT SIGNATURE_________________________________  NURSE SIGNATURE__________________________________  ________________________________________________________________________    Sandra Foster  An incentive spirometer is a tool that can help keep your lungs clear and active. This tool measures how well you are filling your lungs with each breath. Taking long deep breaths may help reverse or decrease the chance of developing breathing (pulmonary) problems (especially infection) following: A long period of time when you are unable to move or be active. BEFORE THE PROCEDURE  If the spirometer includes an indicator to show your best effort, your nurse or respiratory therapist will set it to a desired goal. If possible, sit up straight or lean slightly forward. Try not to slouch. Hold the incentive spirometer in an upright position. INSTRUCTIONS FOR USE  Sit on the edge of your bed if possible, or sit up as far as you can in bed or on a chair. Hold the incentive spirometer in an upright position. Breathe out normally. Place the mouthpiece in your mouth and seal your lips tightly around it. Breathe in slowly and as deeply as possible, raising the piston or the ball toward the top of the column. Hold your breath for 3-5 seconds or for as long as possible. Allow the piston or ball to fall to the bottom of the column. Remove the mouthpiece from your mouth and breathe out normally. Rest for a few seconds and repeat Steps 1 through 7 at least 10 times every 1-2 hours when you are awake. Take your time and take a few normal breaths between deep breaths. The spirometer may include an  indicator to show your best effort. Use the indicator as a goal to work toward during each repetition. After each set of 10 deep breaths, practice coughing to be sure your lungs are clear. If you have an incision (the cut made at the time of surgery), support your incision when coughing by placing a pillow or rolled up towels firmly against it. Once you are  able to get out of bed, walk around indoors and cough well. You may stop using the incentive spirometer when instructed by your caregiver.  RISKS AND COMPLICATIONS Take your time so you do not get dizzy or light-headed. If you are in pain, you may need to take or ask for pain medication before doing incentive spirometry. It is harder to take a deep breath if you are having pain. AFTER USE Rest and breathe slowly and easily. It can be helpful to keep track of a log of your progress. Your caregiver can provide you with a simple table to help with this. If you are using the spirometer at home, follow these instructions: [place] IF:  You are having difficultly using the spirometer. You have trouble using the spirometer as often as instructed. Your pain medication is not giving enough relief while using the spirometer. You develop fever of 100.5 F (38.1 C) or higher. SEEK IMMEDIATE MEDICAL CARE IF:  You cough up bloody sputum that had not been present before. You develop fever of 102 F (38.9 C) or greater. You develop worsening pain at or near the incision site. MAKE SURE YOU:  Understand these instructions. Will watch your condition. Will get help right away if you are not doing well or get worse. Document Released: 02/25/2007 Document Revised: 01/07/2012 Document Reviewed: 04/28/2007 Exit[employer] Patient Information 2014 [employer], [place].   ________________________________________________________________________  WHAT IS A BLOOD TRANSFUSION? Blood Transfusion Information  A transfusion is the replacement of blood or some of  its parts. Blood is made up of multiple cells which provide different functions. Red blood cells carry oxygen and are used for blood loss replacement. White blood cells fight against infection. Platelets control bleeding. Plasma helps clot blood. Other blood products are available for specialized needs, such as hemophilia or other clotting disorders. BEFORE THE TRANSFUSION  Who gives blood for transfusions?  Healthy volunteers who are fully evaluated to make sure their blood is safe. This is blood bank blood. Transfusion therapy is the safest it has ever been in the practice of medicine. Before blood is taken from a donor, a complete history is taken to make sure that person has no history of diseases nor engages in risky social behavior (examples are intravenous drug use or sexual activity with multiple partners). The donor's travel history is screened to minimize risk of transmitting infections, such as malaria. The donated blood is tested for signs of infectious diseases, such as HIV and hepatitis. The blood is then tested to be sure it is compatible with you in order to minimize the chance of a transfusion reaction. If you or a relative donates blood, this is often done in anticipation of surgery and is not appropriate for emergency situations. It takes many days to process the donated blood. RISKS AND COMPLICATIONS Although transfusion therapy is very safe and saves many lives, the main dangers of transfusion include:  Getting an infectious disease. Developing a transfusion reaction. This is an allergic reaction to something in the blood you were given. Every precaution is taken to prevent this. The decision to have a blood transfusion has been considered carefully by your caregiver before blood is given. Blood is not given unless the benefits outweigh the risks. AFTER THE TRANSFUSION Right after receiving a blood transfusion, you will usually feel much better and more energetic. This is  especially true if your red blood cells have gotten low (anemic). The transfusion raises the level of the red blood cells which carry oxygen,  and this usually causes an energy increase. The nurse administering the transfusion will monitor you carefully for complications. HOME CARE INSTRUCTIONS  No special instructions are needed after a transfusion. You may find your energy is better. Speak with your caregiver about any limitations on activity for underlying diseases you may have. SEEK MEDICAL CARE IF:  Your condition is not improving after your transfusion. You develop redness or irritation at the intravenous (IV) site. SEEK IMMEDIATE MEDICAL CARE IF:  Any of the following symptoms occur over the next 12 hours: Shaking chills. You have a temperature by mouth above 102 F (38.9 C), not controlled by medicine. Chest, back, or muscle pain. People around you feel you are not acting correctly or are confused. Shortness of breath or difficulty breathing. Dizziness and fainting. You get a rash or develop hives. You have a decrease in urine output. Your urine turns a dark color or changes to pink, red, or brown. Any of the following symptoms occur over the next 10 days: You have a temperature by mouth above 102 F (38.9 C), not controlled by medicine. Shortness of breath. Weakness after normal activity. The white part of the eye turns yellow (jaundice). You have a decrease in the amount of urine or are urinating less often. Your urine turns a dark color or changes to pink, red, or brown. Document Released: 10/12/2000 Document Revised: 01/07/2012 Document Reviewed: 05/31/2008 [facility] Patient Information 2014 [place], [place].  _______________________________________________________________________

## 2022-12-26 NOTE — Progress Notes (Addendum)
COVID Vaccine Completed:  No  Date of COVID positive in last 90 days:  No  PCP - [facility] Cardiologist - N/A  Chest x-ray - 07-17-22 Epic EKG - 07-11-22 Epic Stress Test - N/A ECHO - N/A Cardiac Cath - N/A Pacemaker/ICD device last checked: Spinal Cord Stimulator:N/A  Bowel Prep - Clear liquids the day before.  Patient states that she thinks she took the antibiotic pills too early and will call the surgeon's office today to get new RX and instructions.  Sleep Study - N/A CPAP -   Fasting Blood Sugar -  Checks Blood Sugar - does not check   Last dose of GLP1 agonist-  N/A GLP1 instructions:  N/A   Last dose of SGLT-2 inhibitors-  N/A SGLT-2 instructions: N/A  Blood Thinner Instructions:  N/A Aspirin Instructions: Last Dose:  Activity level:  Can go up a flight of stairs and perform activities of daily living without stopping and without symptoms of chest pain or shortness of breath.  Anesthesia review: N/A  Patient denies shortness of breath, fever, cough and chest pain at PAT appointment  Patient verbalized understanding of instructions that were given to them at the PAT appointment. Patient was also instructed that they will need to review over the PAT instructions again at home before surgery.

## 2022-12-28 ENCOUNTER — Encounter (HOSPITAL_COMMUNITY): Payer: Self-pay

## 2022-12-28 ENCOUNTER — Other Ambulatory Visit: Payer: Self-pay

## 2022-12-28 ENCOUNTER — Encounter (HOSPITAL_COMMUNITY)
Admission: RE | Admit: 2022-12-28 | Discharge: 2022-12-28 | Disposition: A | Discharge status: Home / Self Care | Payer: Medicaid Other | Source: Ambulatory Visit | Attending: Surgery | Admitting: Surgery

## 2022-12-28 VITALS — BP 118/80 | HR 98 | Temp 98.4°F | Resp 12 | Ht 66.0 in | Wt 151.0 lb

## 2022-12-28 DIAGNOSIS — Z01812 Encounter for preprocedural laboratory examination: Secondary | ICD-10-CM | POA: Insufficient documentation

## 2022-12-28 DIAGNOSIS — Z01818 Encounter for other preprocedural examination: Secondary | ICD-10-CM

## 2022-12-28 DIAGNOSIS — E119 Type 2 diabetes mellitus without complications: Secondary | ICD-10-CM | POA: Insufficient documentation

## 2022-12-28 HISTORY — DX: Anemia, unspecified: D64.9

## 2022-12-28 LAB — CBC WITH DIFFERENTIAL/PLATELET
Abs Immature Granulocytes: 0.01 10*3/uL (ref 0.00–0.07)
Basophils Absolute: 0 10*3/uL (ref 0.0–0.1)
Basophils Relative: 1 %
Eosinophils Absolute: 0 10*3/uL (ref 0.0–0.5)
Eosinophils Relative: 1 %
HCT: 39.3 % (ref 36.0–46.0)
Hemoglobin: 13.4 g/dL (ref 12.0–15.0)
Immature Granulocytes: 0 %
Lymphocytes Relative: 48 %
Lymphs Abs: 2.6 10*3/uL (ref 0.7–4.0)
MCH: 29.4 pg (ref 26.0–34.0)
MCHC: 34.1 g/dL (ref 30.0–36.0)
MCV: 86.2 fL (ref 80.0–100.0)
Monocytes Absolute: 0.6 10*3/uL (ref 0.1–1.0)
Monocytes Relative: 11 %
Neutro Abs: 2.1 10*3/uL (ref 1.7–7.7)
Neutrophils Relative %: 39 %
Platelets: 205 10*3/uL (ref 150–400)
RBC: 4.56 MIL/uL (ref 3.87–5.11)
RDW: 15.2 % (ref 11.5–15.5)
WBC: 5.4 10*3/uL (ref 4.0–10.5)
nRBC: 0 % (ref 0.0–0.2)

## 2022-12-28 LAB — TYPE AND SCREEN
ABO/RH(D): O POS
Antibody Screen: NEGATIVE

## 2022-12-28 LAB — COMPREHENSIVE METABOLIC PANEL
ALT: 47 U/L — ABNORMAL HIGH (ref 0–44)
AST: 32 U/L (ref 15–41)
Albumin: 4.3 g/dL (ref 3.5–5.0)
Alkaline Phosphatase: 42 U/L (ref 38–126)
Anion gap: 6 (ref 5–15)
BUN: 8 mg/dL (ref 6–20)
CO2: 24 mmol/L (ref 22–32)
Calcium: 8.8 mg/dL — ABNORMAL LOW (ref 8.9–10.3)
Chloride: 108 mmol/L (ref 98–111)
Creatinine, Ser: 0.74 mg/dL (ref 0.44–1.00)
GFR, Estimated: >60 mL/min (ref >60)
Glucose, Bld: 108 mg/dL — ABNORMAL HIGH (ref 70–99)
Potassium: 3.4 mmol/L — ABNORMAL LOW (ref 3.5–5.1)
Sodium: 138 mmol/L (ref 135–145)
Total Bilirubin: 0.8 mg/dL (ref 0.3–1.2)
Total Protein: 6.9 g/dL (ref 6.5–8.1)

## 2022-12-28 LAB — GLUCOSE, CAPILLARY: Glucose-Capillary: 127 mg/dL — ABNORMAL HIGH (ref 70–99)

## 2022-12-29 LAB — HEMOGLOBIN A1C
Hgb A1c MFr Bld: 6.3 % — ABNORMAL HIGH (ref 4.8–5.6)
Mean Plasma Glucose: 134 mg/dL

## 2023-01-01 ENCOUNTER — Inpatient Hospital Stay (HOSPITAL_COMMUNITY): Payer: Medicaid Other

## 2023-01-01 ENCOUNTER — Other Ambulatory Visit: Payer: Self-pay

## 2023-01-01 ENCOUNTER — Emergency Department (HOSPITAL_COMMUNITY): Payer: Medicaid Other

## 2023-01-01 ENCOUNTER — Inpatient Hospital Stay (HOSPITAL_COMMUNITY)
Admission: EM | Admit: 2023-01-01 | Discharge: 2023-01-03 | DRG: 389 | Disposition: A | Discharge status: Home / Self Care | Payer: Medicaid Other | Attending: Internal Medicine | Admitting: Internal Medicine

## 2023-01-01 DIAGNOSIS — E876 Hypokalemia: Secondary | ICD-10-CM | POA: Diagnosis present

## 2023-01-01 DIAGNOSIS — Z87891 Personal history of nicotine dependence: Secondary | ICD-10-CM

## 2023-01-01 DIAGNOSIS — K219 Gastro-esophageal reflux disease without esophagitis: Secondary | ICD-10-CM | POA: Diagnosis present

## 2023-01-01 DIAGNOSIS — R1013 Epigastric pain: Secondary | ICD-10-CM | POA: Diagnosis present

## 2023-01-01 DIAGNOSIS — R112 Nausea with vomiting, unspecified: Secondary | ICD-10-CM | POA: Diagnosis not present

## 2023-01-01 DIAGNOSIS — K56609 Unspecified intestinal obstruction, unspecified as to partial versus complete obstruction: Secondary | ICD-10-CM | POA: Diagnosis not present

## 2023-01-01 DIAGNOSIS — D72829 Elevated white blood cell count, unspecified: Secondary | ICD-10-CM | POA: Diagnosis present

## 2023-01-01 DIAGNOSIS — Z932 Ileostomy status: Secondary | ICD-10-CM | POA: Diagnosis not present

## 2023-01-01 DIAGNOSIS — K56601 Complete intestinal obstruction, unspecified as to cause: Secondary | ICD-10-CM | POA: Diagnosis present

## 2023-01-01 DIAGNOSIS — E119 Type 2 diabetes mellitus without complications: Secondary | ICD-10-CM | POA: Diagnosis present

## 2023-01-01 DIAGNOSIS — K5090 Crohn's disease, unspecified, without complications: Secondary | ICD-10-CM | POA: Diagnosis present

## 2023-01-01 DIAGNOSIS — Z79899 Other long term (current) drug therapy: Secondary | ICD-10-CM | POA: Diagnosis not present

## 2023-01-01 DIAGNOSIS — R1084 Generalized abdominal pain: Secondary | ICD-10-CM | POA: Diagnosis not present

## 2023-01-01 DIAGNOSIS — Z8249 Family history of ischemic heart disease and other diseases of the circulatory system: Secondary | ICD-10-CM

## 2023-01-01 DIAGNOSIS — Z886 Allergy status to analgesic agent status: Secondary | ICD-10-CM | POA: Diagnosis not present

## 2023-01-01 LAB — CBC WITH DIFFERENTIAL/PLATELET
Abs Immature Granulocytes: 0.04 10*3/uL (ref 0.00–0.07)
Basophils Absolute: 0 10*3/uL (ref 0.0–0.1)
Basophils Relative: 0 %
Eosinophils Absolute: 0 10*3/uL (ref 0.0–0.5)
Eosinophils Relative: 0 %
HCT: 45.3 % (ref 36.0–46.0)
Hemoglobin: 15.7 g/dL — ABNORMAL HIGH (ref 12.0–15.0)
Immature Granulocytes: 0 %
Lymphocytes Relative: 8 %
Lymphs Abs: 1.3 10*3/uL (ref 0.7–4.0)
MCH: 29.5 pg (ref 26.0–34.0)
MCHC: 34.7 g/dL (ref 30.0–36.0)
MCV: 85.2 fL (ref 80.0–100.0)
Monocytes Absolute: 0.4 10*3/uL (ref 0.1–1.0)
Monocytes Relative: 3 %
Neutro Abs: 14 10*3/uL — ABNORMAL HIGH (ref 1.7–7.7)
Neutrophils Relative %: 89 %
Platelets: 279 10*3/uL (ref 150–400)
RBC: 5.32 MIL/uL — ABNORMAL HIGH (ref 3.87–5.11)
RDW: 14.5 % (ref 11.5–15.5)
WBC: 15.8 10*3/uL — ABNORMAL HIGH (ref 4.0–10.5)
nRBC: 0 % (ref 0.0–0.2)

## 2023-01-01 LAB — COMPREHENSIVE METABOLIC PANEL
ALT: 64 U/L — ABNORMAL HIGH (ref 0–44)
AST: 30 U/L (ref 15–41)
Albumin: 5 g/dL (ref 3.5–5.0)
Alkaline Phosphatase: 50 U/L (ref 38–126)
Anion gap: 9 (ref 5–15)
BUN: 17 mg/dL (ref 6–20)
CO2: 23 mmol/L (ref 22–32)
Calcium: 9.7 mg/dL (ref 8.9–10.3)
Chloride: 102 mmol/L (ref 98–111)
Creatinine, Ser: 0.81 mg/dL (ref 0.44–1.00)
GFR, Estimated: >60 mL/min (ref >60)
Glucose, Bld: 165 mg/dL — ABNORMAL HIGH (ref 70–99)
Potassium: 3.2 mmol/L — ABNORMAL LOW (ref 3.5–5.1)
Sodium: 134 mmol/L — ABNORMAL LOW (ref 135–145)
Total Bilirubin: 0.8 mg/dL (ref 0.3–1.2)
Total Protein: 8.6 g/dL — ABNORMAL HIGH (ref 6.5–8.1)

## 2023-01-01 LAB — LIPASE, BLOOD: Lipase: 31 U/L (ref 11–51)

## 2023-01-01 LAB — I-STAT BETA HCG BLOOD, ED (MC, WL, AP ONLY): I-stat hCG, quantitative: <5 m[IU]/mL (ref <5)

## 2023-01-01 LAB — GLUCOSE, CAPILLARY
Glucose-Capillary: 111 mg/dL — ABNORMAL HIGH (ref 70–99)
Glucose-Capillary: 109 mg/dL — ABNORMAL HIGH (ref 70–99)
Glucose-Capillary: 120 mg/dL — ABNORMAL HIGH (ref 70–99)

## 2023-01-01 MED ORDER — POTASSIUM CHLORIDE IN NACL 40-0.9 MEQ/L-% IV SOLN
INTRAVENOUS | Status: AC
  Filled 2023-01-01 (×3): qty 1000

## 2023-01-01 MED ORDER — ONDANSETRON HCL 4 MG PO TABS
4.0000 mg | ORAL_TABLET | Freq: Four times a day (QID) | ORAL | Status: DC | PRN
  Filled 2023-01-01: qty 1

## 2023-01-01 MED ORDER — PROCHLORPERAZINE EDISYLATE 10 MG/2ML IJ SOLN: 5.0000 mg | Freq: Once | INTRAMUSCULAR | Status: DC

## 2023-01-01 MED ORDER — PANTOPRAZOLE SODIUM 40 MG IV SOLR
40.0000 mg | Freq: Every day | INTRAVENOUS | Status: DC
  Administered 2023-01-01 – 2023-01-03 (×3): 40 mg via INTRAVENOUS
  Filled 2023-01-01 (×3): qty 10

## 2023-01-01 MED ORDER — IOHEXOL 300 MG/ML  SOLN
100.0000 mL | Freq: Once | INTRAMUSCULAR | Status: AC | PRN
  Administered 2023-01-01: 100 mL via INTRAVENOUS

## 2023-01-01 MED ORDER — HYDROMORPHONE HCL 1 MG/ML IJ SOLN
0.5000 mg | Freq: Once | INTRAMUSCULAR | Status: AC
  Administered 2023-01-01: 0.5 mg via INTRAVENOUS
  Filled 2023-01-01: qty 0.5

## 2023-01-01 MED ORDER — ONDANSETRON HCL 4 MG/2ML IJ SOLN
4.0000 mg | Freq: Once | INTRAMUSCULAR | Status: AC
  Administered 2023-01-01: 4 mg via INTRAVENOUS
  Filled 2023-01-01: qty 2

## 2023-01-01 MED ORDER — ONDANSETRON HCL 4 MG/2ML IJ SOLN
4.0000 mg | Freq: Four times a day (QID) | INTRAMUSCULAR | Status: DC | PRN
  Administered 2023-01-01 – 2023-01-02 (×3): 4 mg via INTRAVENOUS
  Filled 2023-01-01 (×3): qty 2

## 2023-01-01 MED ORDER — HYDROMORPHONE HCL 1 MG/ML IJ SOLN
0.5000 mg | Freq: Once | INTRAMUSCULAR | Status: AC
  Administered 2023-01-01: 0.5 mg via INTRAVENOUS
  Filled 2023-01-01: qty 1

## 2023-01-01 MED ORDER — LIDOCAINE VISCOUS HCL 2 % MT SOLN: 15.0000 mL | Freq: Once | OROMUCOSAL | Status: DC

## 2023-01-01 MED ORDER — METOCLOPRAMIDE HCL 5 MG/ML IJ SOLN
10.0000 mg | Freq: Once | INTRAMUSCULAR | Status: AC
  Administered 2023-01-01: 10 mg via INTRAVENOUS
  Filled 2023-01-01: qty 2

## 2023-01-01 MED ORDER — DIATRIZOATE MEGLUMINE & SODIUM 66-10 % PO SOLN
90.0000 mL | Freq: Once | ORAL | Status: AC
  Administered 2023-01-01: 90 mL via NASOGASTRIC
  Filled 2023-01-01: qty 90

## 2023-01-01 MED ORDER — MORPHINE SULFATE (PF) 2 MG/ML IV SOLN
2.0000 mg | INTRAVENOUS | Status: DC | PRN
  Administered 2023-01-01 – 2023-01-02 (×2): 2 mg via INTRAVENOUS
  Filled 2023-01-01 (×3): qty 1

## 2023-01-01 MED ORDER — POTASSIUM CHLORIDE 10 MEQ/100ML IV SOLN
10.0000 meq | INTRAVENOUS | Status: AC
  Administered 2023-01-01 (×2): 10 meq via INTRAVENOUS
  Filled 2023-01-01 (×2): qty 100

## 2023-01-01 MED ORDER — SODIUM CHLORIDE 0.9 % IV SOLN
INTRAVENOUS | Status: DC | PRN
  Administered 2023-01-01: 10 mL via INTRAVENOUS

## 2023-01-01 MED ORDER — LACTATED RINGERS IV BOLUS
1000.0000 mL | Freq: Once | INTRAVENOUS | Status: AC
  Administered 2023-01-01: 1000 mL via INTRAVENOUS

## 2023-01-01 NOTE — Consult Note (Signed)
Reason for Consult: SBO and history of Crohn's disease Referring Physician: [facility]  Sandra Foster HPI: This is a 32 year old female with a PMH of Crohn's disease of the TI s/p right hemicolectomy on 07/13/2022 secondary to failure of medical management admitted for an SBO.  Her symptoms started 3 days ago with abdominal pain  around her ileostomy site and nausea.  She also noted a decline her ostomy output.  A CT scan of the abdomen showed a complete SBO.  Routinely she receives Remicade every two months.  She reports that her current symptoms are not Crohn's related.  Past Medical History:  Diagnosis Date   Anemia    Chlamydia    Crohn's disease ([place])    Diabetes mellitus without complication ([place])    Fetal demise    hyperglycemic hyperosmolar nonketonic syndrome   Hernia    Pregnancy induced hypertension    No meds   Pyelonephritis     Past Surgical History:  Procedure Laterality Date   BIOPSY  01/09/2022   Procedure: BIOPSY;  Surgeon: Carol Chakita, MD;  Location: [facility] ENDOSCOPY;  Service: Gastroenterology;;   COLONOSCOPY WITH PROPOFOL N/A 01/09/2022   Procedure: COLONOSCOPY WITH PROPOFOL;  Surgeon: Carol Chandani, MD;  Location: [place];  Service: Gastroenterology;  Laterality: N/A;   CYSTOSCOPY W/ URETERAL STENT PLACEMENT Bilateral 07/13/2022   Procedure: CYSTOSCOPY WITH STENT REPLACEMENT AND BILATERAL RETROGRADE;  Surgeon: Alexis Frock, MD;  Location: [facility] ORS;  Service: Urology;  Laterality: Bilateral;   HERNIA REPAIR     IR RADIOLOGIST EVAL & MGMT  06/28/2022   PARTIAL COLECTOMY Right 07/13/2022   Procedure: exploratory laparatomy, right hemi-colectomy, and ileostomy creation, and lysis of adhesions;  Surgeon: Ileana Roup, MD;  Location: [facility] OR[facility];  Service: General;  Laterality: Right;    Family History  Problem Relation Age of Onset   Hypertension Maternal Grandmother     Social History:  reports that she has quit smoking. Her smoking use included  cigarettes. She has a 10.50 pack-year smoking history. She quit smokeless tobacco use about 11 years ago. She reports current alcohol use. She reports that she does not use drugs.  Allergies:  Allergies  Allergen Reactions   Nsaids Other (See Comments)    Crohn's disease = NO NSAIDs    Medications: Scheduled:  diatrizoate meglumine-sodium  90 mL Per NG tube Once   lidocaine  15 mL Mouth/Throat Once   pantoprazole (PROTONIX) IV  40 mg Intravenous Daily   Continuous:  sodium chloride 10 mL (01/01/23 1339)   0.9 % NaCl with KCl 40 mEq / L     potassium chloride 10 mEq (01/01/23 1444)    Results for orders placed or performed during the hospital encounter of 01/01/23 (from the past 24 hour(s))  Comprehensive metabolic panel     Status: Abnormal   Collection Time: 01/01/23 11:16 AM  Result Value Ref Range   Sodium 134 (L) 135 - 145 mmol/L   Potassium 3.2 (L) 3.5 - 5.1 mmol/L   Chloride 102 98 - 111 mmol/L   CO2 23 22 - 32 mmol/L   Glucose, Bld 165 (H) 70 - 99 mg/dL   BUN 17 6 - 20 mg/dL   Creatinine, Ser 0.81 0.44 - 1.00 mg/dL   Calcium 9.7 8.9 - 10.3 mg/dL   Total Protein 8.6 (H) 6.5 - 8.1 g/dL   Albumin 5.0 3.5 - 5.0 g/dL   AST 30 15 - 41 U/L   ALT 64 (H) 0 -  44 U/L   Alkaline Phosphatase 50 38 - 126 U/L   Total Bilirubin 0.8 0.3 - 1.2 mg/dL   GFR, Estimated >60 >60 mL/min   Anion gap 9 5 - 15  Lipase, blood     Status: None   Collection Time: 01/01/23 11:16 AM  Result Value Ref Range   Lipase 31 11 - 51 U/L  CBC with Diff     Status: Abnormal   Collection Time: 01/01/23 11:16 AM  Result Value Ref Range   WBC 15.8 (H) 4.0 - 10.5 K/uL   RBC 5.32 (H) 3.87 - 5.11 MIL/uL   Hemoglobin 15.7 (H) 12.0 - 15.0 g/dL   HCT 45.3 36.0 - 46.0 %   MCV 85.2 80.0 - 100.0 fL   MCH 29.5 26.0 - 34.0 pg   MCHC 34.7 30.0 - 36.0 g/dL   RDW 14.5 11.5 - 15.5 %   Platelets 279 150 - 400 K/uL   nRBC 0.0 0.0 - 0.2 %   Neutrophils Relative % 89 %   Neutro Abs 14.0 (H) 1.7 - 7.7 K/uL    Lymphocytes Relative 8 %   Lymphs Abs 1.3 0.7 - 4.0 K/uL   Monocytes Relative 3 %   Monocytes Absolute 0.4 0.1 - 1.0 K/uL   Eosinophils Relative 0 %   Eosinophils Absolute 0.0 0.0 - 0.5 K/uL   Basophils Relative 0 %   Basophils Absolute 0.0 0.0 - 0.1 K/uL   Immature Granulocytes 0 %   Abs Immature Granulocytes 0.04 0.00 - 0.07 K/uL  I-Stat beta hCG blood, ED     Status: None   Collection Time: 01/01/23 11:20 AM  Result Value Ref Range   I-stat hCG, quantitative <5.0 <5 mIU/mL   Comment 3             CT ABDOMEN PELVIS W CONTRAST  Result Date: 01/01/2023 CLINICAL DATA:  Crohn's, abdominal pain, nausea and vomiting. EXAM: CT ABDOMEN AND PELVIS WITH CONTRAST TECHNIQUE: Multidetector CT imaging of the abdomen and pelvis was performed using the standard protocol following bolus administration of intravenous contrast. RADIATION DOSE REDUCTION: This exam was performed according to the departmental dose-optimization program which includes automated exposure control, adjustment of the mA and/or kV according to patient size and/or use of iterative reconstruction technique. CONTRAST:  16m OMNIPAQUE IOHEXOL 300 MG/ML  SOLN COMPARISON:  CT abdomen dated 07/11/2022 FINDINGS: Lower chest: No acute abnormality. Hepatobiliary: No focal liver abnormality is seen. Gallbladder is unremarkable. No bile duct dilatation. Pancreas: Unremarkable. No pancreatic ductal dilatation or surrounding inflammatory changes. Spleen: Normal in size without focal abnormality. Adrenals/Urinary Tract: Adrenal glands appear normal. Kidneys appear normal without mass, stone or hydronephrosis. No perinephric fluid. Bladder is unremarkable. Stomach/Bowel: Markedly distended fluid-filled small bowel loops throughout the abdomen and pelvis, with associated air-fluid levels, consistent with a complete small-bowel obstruction. As there are no decompressed small bowel loops appreciated within the abdomen or pelvis, the obstruction is likely  located just deep to, or just proximal to, the RIGHT abdominal ostomy. Stomach and most proximal portion of the small bowel is relatively normal in caliber. The colon, beyond the ostomy site, is decompressed. Vascular/Lymphatic: No abdominal aortic aneurysm. No acute-appearing vascular abnormality. No enlarged lymph nodes are seen. Reproductive: Uterus and bilateral adnexa are unremarkable. Other: Small amount of free fluid in the pelvis. No abscess collection is seen. No free intraperitoneal air. Musculoskeletal: No osseous abnormality. IMPRESSION: 1. Markedly distended fluid-filled small bowel loops throughout the abdomen and pelvis, with associated air-fluid levels, consistent  with a complete small bowel obstruction. The small-bowel obstruction is likely located just deep to, or just proximal to, the RIGHT abdominal ostomy site. No obstructing mass is identified (adhesions?). 2. Small amount of free fluid in the pelvis. No abscess collection is seen. No free intraperitoneal air. These results were discussed by telephone at the time of interpretation on 01/01/2023 at 1:12 pm with provider Margaretmary Eddy , who verbally acknowledged these results. Electronically Signed   By: Franki Cabot M.D.   On: 01/01/2023 13:15    ROS:  As stated above in the HPI otherwise negative.  Blood pressure 111/75, pulse (!) 58, temperature 98 F (36.7 C), temperature source Oral, resp. rate 16, SpO2 99 %.    PE: Gen: NAD, Alert and Oriented HEENT:  [place]/AT, EOMI Neck: Supple, no LAD Lungs: CTA Bilaterally CV: RRR without M/G/R ABD: Soft, mildly tympanic, mild/moderate diffuse tenderness, +BS Ext: No C/C/E  Assessment/Plan: 1) SBO. 2) History of Crohn's disease. 3) Abdominal pain.   Her current symptoms are not as a result of Crohn's.  The imaging does not show any inflammation to suggest a Crohn's flare.  Plan: 1) NG tube and SBO management per Surgery. 2) No need to any steroids.  Remicade monotherapy, as an  outpatient is sufficient. 3) Signing off.  Call with any questions. Kenry Daubert D 01/01/2023, 3:02 PM

## 2023-01-01 NOTE — ED Provider Notes (Signed)
[facility] Provider Note   CSN: [id number] Arrival date & time: 01/01/23  1023     History {Add pertinent medical, surgical, social history, OB history to HPI:1} Chief Complaint  Patient presents with   Abdominal Pain   Nausea    Sandra Foster is a 32 y.o. female.  32 year old female with a history of diabetes and Crohn's disease status post ostomy placement who presents to the emergency department with nausea, vomiting, abdominal pain, decreased ostomy output.  3 days ago started feeling epigastric fullness and pain.  Feels like she has swelling in her epigastrium.  Yesterday had approximately 30 episodes of nonbloody nonbilious emesis.  For the past 2 days has also had decreased ostomy output that has not changed in color.  Says that she has hot flashes but no recorded fevers.  No dysuria or frequency.  Still has gallbladder and appendix.  Was given Zofran by EMS with some improvement of her nausea and vomiting.       Home Medications Prior to Admission medications   Medication Sig Start Date End Date Taking? Authorizing Provider  acetaminophen (TYLENOL) 500 MG tablet Take 1 tablet (500 mg total) by mouth every 6 (six) hours as needed. Patient taking differently: Take 1,000 mg by mouth every 6 (six) hours as needed for mild pain. 05/22/22   Varney Biles, MD  blood glucose meter kit and supplies Dispense based on patient and insurance preference. Use up to four times daily as directed. (FOR ICD-10 E10.9, E11.9). 04/26/22   Mercy Riding, MD  etonogestrel (NEXPLANON) 68 MG IMPL implant 1 each by Subdermal route once.    [provider]  ondansetron (ZOFRAN) 4 MG tablet Take 1 tablet (4 mg total) by mouth daily as needed for nausea or vomiting. Patient not taking: Reported on 12/25/2022 07/20/22 07/20/23  Geradine Girt, DO  oxyCODONE (OXY IR/ROXICODONE) 5 MG immediate release tablet Take 1-2 tablets (5-10 mg total) by mouth  every 6 (six) hours as needed for breakthrough pain. Patient not taking: Reported on 12/25/2022 07/20/22   Jillyn Ledger, PA-C  polycarbophil (FIBERCON) 625 MG tablet Take 1 tablet (625 mg total) by mouth daily. Patient not taking: Reported on 12/25/2022 07/21/22   Jillyn Ledger, PA-C  amitriptyline (ELAVIL) 25 MG tablet Take 1-2 tablets (25-50 mg total) by mouth at bedtime as needed (headache). 03/25/20 06/18/20  Gregor Hams, MD      Allergies    Nsaids    Review of Systems   Review of Systems  Physical Exam Updated Vital Signs BP 122/84   Pulse 68   Temp 98.4 F (36.9 C) (Oral)   Resp 16   SpO2 99%  Physical Exam Vitals and nursing note reviewed.  Constitutional:      General: She is not in acute distress.    Appearance: She is well-developed.  HENT:     Head: Normocephalic and atraumatic.     Right Ear: External ear normal.     Left Ear: External ear normal.     Nose: Nose normal.  Eyes:     Extraocular Movements: Extraocular movements intact.     Conjunctiva/sclera: Conjunctivae normal.     Pupils: Pupils are equal, round, and reactive to light.  Cardiovascular:     Rate and Rhythm: Normal rate and regular rhythm.  Pulmonary:     Effort: Pulmonary effort is normal. No respiratory distress.  Abdominal:     General: Abdomen is flat.  There is no distension.     Palpations: Abdomen is soft. There is no mass.     Tenderness: There is abdominal tenderness (Diffuse). There is no guarding.     Comments: Ostomy with brown output  Musculoskeletal:     Cervical back: Normal range of motion and neck supple.     Right lower leg: No edema.     Left lower leg: No edema.  Skin:    General: Skin is warm and dry.  Neurological:     Mental Status: She is alert and oriented to person, place, and time. Mental status is at baseline.  Psychiatric:        Mood and Affect: Mood normal.     ED Results / Procedures / Treatments   Labs (all labs ordered are listed, but only  abnormal results are displayed) Labs Reviewed  COMPREHENSIVE METABOLIC PANEL  LIPASE, BLOOD  CBC WITH DIFFERENTIAL/PLATELET  I-STAT BETA HCG BLOOD, ED ([facility], [facility], [facility] ONLY)    EKG None  Radiology No results found.  Procedures Procedures  {Document cardiac monitor, telemetry assessment procedure when appropriate:1}  Medications Ordered in ED Medications - No data to display  ED Course/ Medical Decision Making/ A&P   {   Click here for ABCD2, HEART and other calculatorsREFRESH Note before signing :1}                          Medical Decision Making Amount and/or Complexity of Data Reviewed Labs: ordered. Radiology: ordered.  Risk Prescription drug management.   ***  {Document critical care time when appropriate:1} {Document review of labs and clinical decision tools ie heart score, Chads2Vasc2 etc:1}  {Document your independent review of radiology images, and any outside records:1} {Document your discussion with family members, caretakers, and with consultants:1} {Document social determinants of health affecting pt's care:1} {Document your decision making why or why not admission, treatments were needed:1} Final Clinical Impression(s) / ED Diagnoses Final diagnoses:  None    Rx / DC Orders ED Discharge Orders     None

## 2023-01-01 NOTE — Assessment & Plan Note (Signed)
Secondary to poor oral intake Supplement potassium

## 2023-01-01 NOTE — H&P (Signed)
History and Physical    Patient: Sandra Foster [id number] DOB: 12-20-1990 DOA: 01/01/2023 DOS: the patient was seen and examined on 01/01/2023 PCP: Jorge Ny, PA-C  Patient coming from: Home  Chief Complaint:  Chief Complaint  Patient presents with   Abdominal Pain   Nausea   HPI: Sandra Foster is a 32 y.o. 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  This progressively worsened with multiple hospitalizations and development of abscess as well as inflammation causing extrensic compression of ureters and bilateral hydronephrosis. Attempts had been made to treat non-operatively with antibiotics during prior admits, as well as steroids + Remicaid without any significant improvement.  Patient eventually had exploratory laparotomy with right hemicolectomy and end ileostomy on 07/13/22. She receives Remicade monthly. She presents to the emergency room with a 3-day history of nausea and pain around her ileostomy site with decreased ileostomy output.  She rates her pain a 9 x 10 in intensity at its worst and has been unable to tolerate any oral intake.  She also notes that her abdomen appears more distended than normal. She denies having any fever, no chills, no chest pain, no emesis, no shortness of breath, no urinary symptoms, no headache, no dizziness, no lightheadedness, no leg swelling, no blurred vision or focal deficit. Abnormal labs include a white count of 15.8, potassium of 3.2 CT scan of abdomen and pelvis shows markedly distended fluid-filled small bowel loops throughout the abdomen and pelvis, with associated air-fluid levels, consistent with a complete small bowel obstruction. The small-bowel obstruction is likely located just deep to, or just proximal to, the RIGHT abdominal ostomy site. No obstructing mass is identified (adhesions?). Small amount of free fluid in the pelvis.  No abscess collection is seen. No free intraperitoneal air. Surgery has been consulted and patient will be admitted to the hospital for further evaluation.   Review of Systems: As mentioned in the history of present illness. All other systems reviewed and are negative. Past Medical History:  Diagnosis Date   Anemia    Chlamydia    Crohn's disease ([place])    Diabetes mellitus without complication ([place])    Fetal demise    hyperglycemic hyperosmolar nonketonic syndrome   Hernia    Pregnancy induced hypertension    No meds   Pyelonephritis    Past Surgical History:  Procedure Laterality Date   BIOPSY  01/09/2022   Procedure: BIOPSY;  Surgeon: Carol Carnella, MD;  Location: [facility] ENDOSCOPY;  Service: Gastroenterology;;   COLONOSCOPY WITH PROPOFOL N/A 01/09/2022   Procedure: COLONOSCOPY WITH PROPOFOL;  Surgeon: Carol Mariane, MD;  Location: [facility];  Service: Gastroenterology;  Laterality: N/A;   CYSTOSCOPY W/ URETERAL STENT PLACEMENT Bilateral 07/13/2022   Procedure: CYSTOSCOPY WITH STENT REPLACEMENT AND BILATERAL RETROGRADE;  Surgeon: Alexis Frock, MD;  Location: [facility];  Service: Urology;  Laterality: Bilateral;   HERNIA REPAIR     IR RADIOLOGIST EVAL & MGMT  06/28/2022   PARTIAL COLECTOMY Right 07/13/2022   Procedure: exploratory laparatomy, right hemi-colectomy, and ileostomy creation, and lysis of adhesions;  Surgeon: Ileana Roup, MD;  Location: [facility]S;  Service: General;  Laterality: Right;   Social History:  reports that she has quit smoking. Her smoking use included cigarettes. She has a 10.50 pack-year smoking history. She quit smokeless tobacco use about 11 years ago. She reports current alcohol use. She reports that she does not use drugs.  Allergies  Allergen Reactions   Nsaids Other (See Comments)    Crohn's disease = NO NSAIDs    Family History  Problem Relation Age of Onset   Hypertension Maternal Grandmother     Prior to Admission medications    Medication Sig Start Date End Date Taking? Authorizing Provider  acetaminophen (TYLENOL) 500 MG tablet Take 1 tablet (500 mg total) by mouth every 6 (six) hours as needed. Patient taking differently: Take 1,000 mg by mouth every 6 (six) hours as needed for mild pain. 05/22/22   Varney Biles, MD  blood glucose meter kit and supplies Dispense based on patient and insurance preference. Use up to four times daily as directed. (FOR ICD-10 E10.9, E11.9). 04/26/22   Mercy Riding, MD  etonogestrel (NEXPLANON) 68 MG IMPL implant 1 each by Subdermal route once.    [provider]  ondansetron (ZOFRAN) 4 MG tablet Take 1 tablet (4 mg total) by mouth daily as needed for nausea or vomiting. Patient not taking: Reported on 12/25/2022 07/20/22 07/20/23  Geradine Girt, DO  oxyCODONE (OXY IR/ROXICODONE) 5 MG immediate release tablet Take 1-2 tablets (5-10 mg total) by mouth every 6 (six) hours as needed for breakthrough pain. Patient not taking: Reported on 12/25/2022 07/20/22   Jillyn Ledger, PA-C  polycarbophil (FIBERCON) 625 MG tablet Take 1 tablet (625 mg total) by mouth daily. Patient not taking: Reported on 12/25/2022 07/21/22   Jillyn Ledger, PA-C  amitriptyline (ELAVIL) 25 MG tablet Take 1-2 tablets (25-50 mg total) by mouth at bedtime as needed (headache). 03/25/20 06/18/20  Gregor Hams, MD    Physical Exam: Vitals:   01/01/23 1315 01/01/23 1330 01/01/23 1345 01/01/23 1400  BP: 110/66 123/83 112/68 111/75  Pulse: 63 68 70 (!) 58  Resp:    16  Temp:      TempSrc:      SpO2: 99% 100% 96% 99%   Physical Exam Vitals and nursing note reviewed.  Constitutional:      Appearance: She is well-developed.  HENT:     Head: Normocephalic and atraumatic.     Mouth/Throat:     Comments: Dry  Eyes:     Comments: Pale conjunctiva  Cardiovascular:     Rate and Rhythm: Normal rate and regular rhythm.  Pulmonary:     Effort: Pulmonary effort is normal.     Breath sounds: Normal breath  sounds.  Abdominal:     General: Abdomen is flat. Bowel sounds are decreased.     Tenderness: There is generalized abdominal tenderness.     Comments: Ileostomy bag in place  Skin:    General: Skin is warm and dry.  Neurological:     Mental Status: She is alert and oriented to person, place, and time.  Psychiatric:        Mood and Affect: Mood normal.        Behavior: Behavior normal.     Data Reviewed: Relevant notes from primary care and specialist visits, past discharge summaries as available in EHR, including Care Everywhere. Prior diagnostic testing as pertinent to current admission diagnoses Updated medications and problem lists for reconciliation ED course, including vitals, labs, imaging, treatment and response to treatment Triage notes, nursing and pharmacy notes and ED provider's notes Notable results as noted in HPI Labs reviewed.  Sodium 134, potassium 3.2, chloride 102, bicarb 23, glucose 165, BUN 17, creatinine 0.81, calcium 9.7, total protein 8.6, albumin 5.0, AST 30, ALT 64, alkaline phosphatase 50, total bilirubin 0.8, lipase  31, white count 15.8, hemoglobin 15.7, hematocrit 45.3, platelet count 279 There are no new results to review at this time.  Assessment and Plan: * SBO (small bowel obstruction) ([place]) Patient presents for evaluation of a 2-day history of nausea, abdominal pain and decreased output in her ileostomy Imaging shows markedly distended fluid-filled small bowel loops throughout the abdomen and pelvis, with associated air-fluid levels, consistent with a complete small bowel obstruction. The small-bowel obstruction is likely located just deep to, or just proximal to, the RIGHT abdominal ostomy site. No obstructing mass is identified (adhesions?). Keep patient n.p.o. 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 She has been on monthly infusions of Remicade and is being admitted to the hospital now for small bowel obstruction Will consult GI for further recommendations  Hypokalemia Secondary to poor oral intake Supplement potassium  Controlled type 2 diabetes mellitus without complication, without long-term current use of insulin (H[facility]) Last hemoglobin A1c 6.3 Patient is not on insulin or any oral hypoglycemic agent Check blood sugars every 4 hours while NPO  Gastroesophageal reflux disease Continue IV PPI      Advance Care Planning:   Code Status: Full Code   Consults: Surgery, gastroenterology  Family Communication: Greater than 50% of time was spent discussing plan of care with patient at the bedside.  All questions and concerns have been addressed.  She verbalizes understanding and agrees to the plan.  Severity of Illness: The appropriate patient status for this patient is INPATIENT. Inpatient status is judged to be reasonable and necessary in order to provide the required intensity of service to ensure the patient's safety. The patient's presenting symptoms, physical exam findings, and initial radiographic and laboratory data in the context of their chronic comorbidities is felt to place them at high risk for further clinical deterioration. Furthermore, it is not anticipated that the patient will be medically stable for discharge from the hospital within 2 midnights of admission.   * I certify that at the point of admission it is my clinical judgment that the patient will require inpatient hospital care spanning beyond 2 midnights from the point of admission due to high intensity of service, high risk for further deterioration and high frequency of surveillance required.*  Author: Collier Bullock, MD 01/01/2023 2:27  PM  For on call review [email or web address].

## 2023-01-01 NOTE — Assessment & Plan Note (Signed)
Patient presents for evaluation of a 2-day history of nausea, abdominal pain and decreased output in her ileostomy Imaging shows markedly distended fluid-filled small bowel loops throughout the abdomen and pelvis, with associated air-fluid levels, consistent with a complete small bowel obstruction. The small-bowel obstruction is likely located just deep to, or just proximal to, the RIGHT abdominal ostomy site. No obstructing mass is identified (adhesions?). Keep patient n.p.o. Gastric decompression with NG tube Supportive care with pain control, IV fluid hydration, IV PPI and antiemetics Consult surgery

## 2023-01-01 NOTE — Assessment & Plan Note (Signed)
Continue IV PPI. ?

## 2023-01-01 NOTE — Consult Note (Signed)
[facility] Surgery Consult Note  MALACHI ALDERETE 1991/05/12  HO:[id number].    Requesting MD: Margaretmary Eddy Chief Complaint/Reason for Consult: SBO  HPI:  ZOEYLYNN CELESTINE is a 32 y.o. female PMH Crohn's disease on Remicade and followed by Dr. Collene Mares, who presented to the ED today with chief complaint abdominal pain. Patient underwent exploratory laparotomy with right hemicolectomy and end ileostomy 07/13/22 by Dr. Dema Severin for perforated ileocolic Crohn's disease after failing non-operative management. She was last seen in our office by Dr. Dema Severin on 11/27/22 where she was doing very well and was scheduled for ileostomy takedown 01/09/23.  Unfortunately 3 days ago she developed abdominal pain, nausea, and vomiting. Pain is mostly in her upper abdomen but she mostly complaints of feeling full and abdominal distention. She has had multiple episodes of nausea and vomiting. She has had no ostomy output for 2 days. Other sxs include chills. Patient was worked up by [place] with CT scan which shows markedly distended fluid-filled small bowel loops throughout the abdomen and pelvis with associated air-fluid levels, consistent with a small bowel obstruction; the small-bowel obstruction is likely located just deep to, or just proximal to, the RIGHT abdominal ostomy site and possibly due to adhesions; no obstructing mass is identified; small amount of free fluid in the pelvis; no abscess or free intraperitoneal air.  General surgery asked to see.  Anticoagulants: none Former smoker Drinks alcohol occasionally Denies illicit drug use Employment: [employer]  ROS: ROS  All systems reviewed and otherwise negative except for as above  Family History  Problem Relation Age of Onset   Hypertension Maternal Grandmother     Past Medical History:  Diagnosis Date   Anemia    Chlamydia    Crohn's disease ([place])    Diabetes mellitus without complication ([place])    Fetal demise    hyperglycemic hyperosmolar  nonketonic syndrome   Hernia    Pregnancy induced hypertension    No meds   Pyelonephritis     Past Surgical History:  Procedure Laterality Date   BIOPSY  01/09/2022   Procedure: BIOPSY;  Surgeon: Carol Ebonique, MD;  Location: [facility];  Service: Gastroenterology;;   COLONOSCOPY WITH PROPOFOL N/A 01/09/2022   Procedure: COLONOSCOPY WITH PROPOFOL;  Surgeon: Carol Joann, MD;  Location: [place] [facility];  Service: Gastroenterology;  Laterality: N/A;   CYSTOSCOPY W/ URETERAL STENT PLACEMENT Bilateral 07/13/2022   Procedure: CYSTOSCOPY WITH STENT REPLACEMENT AND BILATERAL RETROGRADE;  Surgeon: Alexis Frock, MD;  [facility];  Service: Urology;  Laterality: Bilateral;   HERNIA REPAIR     IR RADIOLOGIST EVAL & MGMT  06/28/2022   PARTIAL COLECTOMY Right 07/13/2022   Procedure: exploratory laparatomy, right hemi-colectomy, and ileostomy creation, and lysis of adhesions;  Surgeon: Ileana Roup, MD;  [facility];  Service: General;  Laterality: Right;    Social History:  reports that she has quit smoking. Her smoking use included cigarettes. She has a 10.50 pack-year smoking history. She quit smokeless tobacco use about 11 years ago. She reports current alcohol use. She reports that she does not use drugs.  Allergies:  Allergies  Allergen Reactions   Nsaids Other (See Comments)    Crohn's disease = NO NSAIDs    (Not in a hospital admission)   Prior to Admission medications   Medication Sig Start Date End Date Taking? Authorizing Provider  acetaminophen (TYLENOL) 500 MG tablet Take 1 tablet (500 mg total) by mouth every 6 (six) hours as needed. Patient taking differently: Take 1,000  mg by mouth every 6 (six) hours as needed for mild pain. 05/22/22   Varney Biles, MD  blood glucose meter kit and supplies Dispense based on patient and insurance preference. Use up to four times daily as directed. (FOR ICD-10 E10.9, E11.9). 04/26/22   Mercy Riding, MD  etonogestrel  (NEXPLANON) 68 MG IMPL implant 1 each by Subdermal route once.    [provider]  ondansetron (ZOFRAN) 4 MG tablet Take 1 tablet (4 mg total) by mouth daily as needed for nausea or vomiting. Patient not taking: Reported on 12/25/2022 07/20/22 07/20/23  Geradine Girt, DO  oxyCODONE (OXY IR/ROXICODONE) 5 MG immediate release tablet Take 1-2 tablets (5-10 mg total) by mouth every 6 (six) hours as needed for breakthrough pain. Patient not taking: Reported on 12/25/2022 07/20/22   Jillyn Ledger, PA-C  polycarbophil (FIBERCON) 625 MG tablet Take 1 tablet (625 mg total) by mouth daily. Patient not taking: Reported on 12/25/2022 07/21/22   Jillyn Ledger, PA-C  amitriptyline (ELAVIL) 25 MG tablet Take 1-2 tablets (25-50 mg total) by mouth at bedtime as needed (headache). 03/25/20 06/18/20  Gregor Hams, MD    Blood pressure 105/66, pulse (!) 57, temperature 98.4 F (36.9 C), temperature source Oral, resp. rate 16, SpO2 98 %. Physical Exam: General: pleasant, WD/WN female who is laying in bed in NAD HEENT: head is normocephalic, atraumatic.  Sclera are noninjected.  Pupils equal and round.  Ears and nose without any masses or lesions.  Mouth is pink and moist. Dentition fair Heart: regular, rate, and rhythm.  Normal s1,s2. No obvious murmurs, gallops, or rubs noted.  Palpable radial and pedal pulses bilaterally  Lungs: CTAB, no wheezes, rhonchi, or rales noted.  Respiratory effort nonlabored Abd: mild to moderate distention, soft, mild global tenderness without guarding, no masses, hernias, or organomegaly; RLQ stoma is pink and viable. There is stool in her ostomy pouch - pt states this has been there for 2 days. Attempted to digitize stoma through her ostomy pouch, no ostomy supplies in ED currently.  MS: no BUE/BLE edema, calves soft and nontender Skin: warm and dry with no masses, lesions, or rashes Psych: A&Ox4 with an appropriate affect Neuro: MAEs, no gross motor or sensory deficits  BUE/BLE  Results for orders placed or performed during the hospital encounter of 01/01/23 (from the past 48 hour(s))  Comprehensive metabolic panel     Status: Abnormal   Collection Time: 01/01/23 11:16 AM  Result Value Ref Range   Sodium 134 (L) 135 - 145 mmol/L   Potassium 3.2 (L) 3.5 - 5.1 mmol/L   Chloride 102 98 - 111 mmol/L   CO2 23 22 - 32 mmol/L   Glucose, Bld 165 (H) 70 - 99 mg/dL    Comment: Glucose reference range applies only to samples taken after fasting for at least 8 hours.   BUN 17 6 - 20 mg/dL   Creatinine, Ser 0.81 0.44 - 1.00 mg/dL   Calcium 9.7 8.9 - 10.3 mg/dL   Total Protein 8.6 (H) 6.5 - 8.1 g/dL   Albumin 5.0 3.5 - 5.0 g/dL   AST 30 15 - 41 U/L   ALT 64 (H) 0 - 44 U/L   Alkaline Phosphatase 50 38 - 126 U/L   Total Bilirubin 0.8 0.3 - 1.2 mg/dL   GFR, Estimated >60 >60 mL/min    Comment: (NOTE) Calculated using the CKD-EPI Creatinine Equation (2021)    Anion gap 9 5 - 15  Comment: Performed at [facility], [place] [address] [place], [place] [address]  Lipase, blood     Status: None   Collection Time: 01/01/23 11:16 AM  Result Value Ref Range   Lipase 31 11 - 51 U/L    Comment: Performed at [facility], [place] [address], [place], [place] [address]  CBC with Diff     Status: Abnormal   Collection Time: 01/01/23 11:16 AM  Result Value Ref Range   WBC 15.8 (H) 4.0 - 10.5 K/uL   RBC 5.32 (H) 3.87 - 5.11 MIL/uL   Hemoglobin 15.7 (H) 12.0 - 15.0 g/dL   HCT 45.3 36.0 - 46.0 %   MCV 85.2 80.0 - 100.0 fL   MCH 29.5 26.0 - 34.0 pg   MCHC 34.7 30.0 - 36.0 g/dL   RDW 14.5 11.5 - 15.5 %   Platelets 279 150 - 400 K/uL   nRBC 0.0 0.0 - 0.2 %   Neutrophils Relative % 89 %   Neutro Abs 14.0 (H) 1.7 - 7.7 K/uL   Lymphocytes Relative 8 %   Lymphs Abs 1.3 0.7 - 4.0 K/uL   Monocytes Relative 3 %   Monocytes Absolute 0.4 0.1 - 1.0 K/uL   Eosinophils Relative 0 %   Eosinophils Absolute 0.0 0.0 - 0.5 K/uL   Basophils Relative 0  %   Basophils Absolute 0.0 0.0 - 0.1 K/uL   Immature Granulocytes 0 %   Abs Immature Granulocytes 0.04 0.00 - 0.07 K/uL    Comment: Performed at [facility], [place] [address], [place], [place] [address]  I-Stat beta hCG blood, ED     Status: None   Collection Time: 01/01/23 11:20 AM  Result Value Ref Range   I-stat hCG, quantitative <5.0 <5 mIU/mL   Comment 3            Comment:   GEST. AGE      CONC.  (mIU/mL)   <=1 WEEK        5 - 50     2 WEEKS       50 - 500     3 WEEKS       100 - 10,000     4 WEEKS     1,000 - 30,000        FEMALE AND NON-PREGNANT FEMALE:     LESS THAN 5 mIU/mL    CT ABDOMEN PELVIS W CONTRAST  Result Date: 01/01/2023 CLINICAL DATA:  Crohn's, abdominal pain, nausea and vomiting. EXAM: CT ABDOMEN AND PELVIS WITH CONTRAST TECHNIQUE: Multidetector CT imaging of the abdomen and pelvis was performed using the standard protocol following bolus administration of intravenous contrast. RADIATION DOSE REDUCTION: This exam was performed according to the departmental dose-optimization program which includes automated exposure control, adjustment of the mA and/or kV according to patient size and/or use of iterative reconstruction technique. CONTRAST:  146m OMNIPAQUE IOHEXOL 300 MG/ML  SOLN COMPARISON:  CT abdomen dated 07/11/2022 FINDINGS: Lower chest: No acute abnormality. Hepatobiliary: No focal liver abnormality is seen. Gallbladder is unremarkable. No bile duct dilatation. Pancreas: Unremarkable. No pancreatic ductal dilatation or surrounding inflammatory changes. Spleen: Normal in size without focal abnormality. Adrenals/Urinary Tract: Adrenal glands appear normal. Kidneys appear normal without mass, stone or hydronephrosis. No perinephric fluid. Bladder is unremarkable. Stomach/Bowel: Markedly distended fluid-filled small bowel loops throughout the abdomen and pelvis, with associated air-fluid levels, consistent with a complete small-bowel obstruction. As there  are no decompressed small bowel loops appreciated within  the abdomen or pelvis, the obstruction is likely located just deep to, or just proximal to, the RIGHT abdominal ostomy. Stomach and most proximal portion of the small bowel is relatively normal in caliber. The colon, beyond the ostomy site, is decompressed. Vascular/Lymphatic: No abdominal aortic aneurysm. No acute-appearing vascular abnormality. No enlarged lymph nodes are seen. Reproductive: Uterus and bilateral adnexa are unremarkable. Other: Small amount of free fluid in the pelvis. No abscess collection is seen. No free intraperitoneal air. Musculoskeletal: No osseous abnormality. IMPRESSION: 1. Markedly distended fluid-filled small bowel loops throughout the abdomen and pelvis, with associated air-fluid levels, consistent with a complete small bowel obstruction. The small-bowel obstruction is likely located just deep to, or just proximal to, the RIGHT abdominal ostomy site. No obstructing mass is identified (adhesions?). 2. Small amount of free fluid in the pelvis. No abscess collection is seen. No free intraperitoneal air. These results were discussed by telephone at the time of interpretation on 01/01/2023 at 1:12 pm with provider Margaretmary Eddy , who verbally acknowledged these results. Electronically Signed   By: Franki Cabot M.D.   On: 01/01/2023 13:15      Assessment/Plan -SBO -Hx ex lap right hemicolectomy and end ileostomy 07/13/22 by Dr. Dema Severin for perforating ileocolic Crohn's disease - Patient with 3 days of abdominal pain, nausea, vomiting, and decreased ostomy output. CT scan concerning for SBO. Scheduled for ileostomy takedown 01/09/23 with Dr. Dema Severin. - CT scan today shows markedly distended fluid-filled small bowel loops throughout the abdomen and pelvis with associated air-fluid levels, consistent with a complete small bowel obstruction; the small-bowel obstruction is likely located just deep to, or just proximal to, the RIGHT  abdominal ostomy site and possibly due to adhesions; no obstructing mass is identified; small amount of free fluid in the pelvis; no abscess or free intraperitoneal air - No indication for acute surgical intervention. Recommend medical admission, bowel rest and NG tube for decompression. Consider GI consult although CT scan does not report any evidence of active Crohn's disease, therefore SBO more likely secondary to adhesions. Once NG output decreases will start patient on SBO protocol with gastrograffin and delayed film. I will also plan to obtain ostomy supplies now and the digitize the stoma to ensure she is not obstructed at the level of her stoma/skin. Hopefully this will resolve non-opreratively and she will still be able to have her ileostomy takedown later this month with Dr. Dema Severin. We will follow.  ID - none  VTE - SCDs, ok for chemical dvt ppx from surgical standpoint FEN - IVF, NPO/NGT to LIWS Foley - none Admit - to medical service   I reviewed nursing notes, ED provider notes, last 24 h vitals and pain scores, last 48 h intake and output, last 24 h labs and trends, and last 24 h imaging results.   Obie Dredge, PA-C [facility] 01/01/2023, 1:25 PM Please see Amion for pager number during day hours 7:00am-4:30pm

## 2023-01-01 NOTE — Assessment & Plan Note (Signed)
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 She has been on monthly infusions of Remicade and is being admitted to the hospital now for small bowel obstruction Will consult GI for further recommendations

## 2023-01-01 NOTE — ED Notes (Signed)
ED TO INPATIENT HANDOFF REPORT  ED Nurse Name and Phone #: Adair Patter [phone]  S Name/Age/Gender Sandra Foster 32 y.o. female Room/Bed: WA25/WA25  Code Status   Code Status: Full Code  Home/SNF/Other Home Patient oriented to: self, place, time, and situation Is this baseline? Yes   Triage Complete: Triage complete  Chief Complaint SBO (small bowel obstruction) ([place]) [id number]  Triage Note EMS reports from home, Hx of Crohn's with Ostomy X 6 months. Pt c/o nausea and abdominal pain and decreased output x 2 days..  BP 120/78 HR 70 RR 18 Sp02 98 RA CBG 175  18ga LAC 500 ml NS  '4mg'$  Zofran enroute.   Allergies Allergies  Allergen Reactions   Nsaids Other (See Comments)    Crohn's disease = NO NSAIDs    Level of Care/Admitting Diagnosis ED Disposition     ED Disposition  Admit   Condition  --   Comment  Hospital Area: [place] [[id number]]  Level of Care: Med-Surg [16]  May admit patient to [facility] or [facility] if equivalent level of care is available:: Yes  Covid Evaluation: Asymptomatic - no recent exposure (last 10 days) testing not required  Diagnosis: SBO (small bowel obstruction) [id number]  Admitting Physician: Gary Fleet  Attending Physician: AGBATA, TOCHUKWU [id number]  Certification:: I certify this patient will need inpatient services for at least 2 midnights  Estimated Length of Stay: 3          B Medical/Surgery History Past Medical History:  Diagnosis Date   Anemia    Chlamydia    Crohn's disease ([place])    Diabetes mellitus without complication ([place])    Fetal demise    hyperglycemic hyperosmolar nonketonic syndrome   Hernia    Pregnancy induced hypertension    No meds   Pyelonephritis    Past Surgical History:  Procedure Laterality Date   BIOPSY  01/09/2022   Procedure: BIOPSY;  Surgeon: Carol Morgana, MD;  Location: [facility];  Service: Gastroenterology;;   COLONOSCOPY WITH  PROPOFOL N/A 01/09/2022   Procedure: COLONOSCOPY WITH PROPOFOL;  Surgeon: Carol Koi, MD;  Location: [place];  Service: Gastroenterology;  Laterality: N/A;   CYSTOSCOPY W/ URETERAL STENT PLACEMENT Bilateral 07/13/2022   Procedure: CYSTOSCOPY WITH STENT REPLACEMENT AND BILATERAL RETROGRADE;  Surgeon: Alexis Frock, MD;  [facility];  Service: Urology;  Laterality: Bilateral;   HERNIA REPAIR     IR RADIOLOGIST EVAL & MGMT  06/28/2022   PARTIAL COLECTOMY Right 07/13/2022   Procedure: exploratory laparatomy, right hemi-colectomy, and ileostomy creation, and lysis of adhesions;  Surgeon: Ileana Roup, MD;  [facility];  Service: General;  Laterality: Right;     A IV Location/Drains/Wounds Patient Lines/Drains/Airways Status     Active Line/Drains/Airways     Name Placement date Placement time Site Days   Peripheral IV 01/01/23 18 G Anterior;Left;Proximal Forearm 01/01/23  1034  Forearm  less than 1   Ileostomy RLQ 07/13/22  1320  RLQ  172   Ureteral Drain/Stent Left ureter 5 Fr. 07/13/22  1023  Left ureter  172   Ureteral Drain/Stent Right ureter 5 Fr. 07/13/22  1027  Right ureter  172            Intake/Output Last 24 hours No intake or output data in the 24 hours ending 01/01/23 1438  Labs/Imaging Results for orders placed or performed during the hospital encounter of 01/01/23 (from the past 48 hour(s))  Comprehensive metabolic panel  Status: Abnormal   Collection Time: 01/01/23 11:16 AM  Result Value Ref Range   Sodium 134 (L) 135 - 145 mmol/L   Potassium 3.2 (L) 3.5 - 5.1 mmol/L   Chloride 102 98 - 111 mmol/L   CO2 23 22 - 32 mmol/L   Glucose, Bld 165 (H) 70 - 99 mg/dL    Comment: Glucose reference range applies only to samples taken after fasting for at least 8 hours.   BUN 17 6 - 20 mg/dL   Creatinine, Ser 0.81 0.44 - 1.00 mg/dL   Calcium 9.7 8.9 - 10.3 mg/dL   Total Protein 8.6 (H) 6.5 - 8.1 g/dL   Albumin 5.0 3.5 - 5.0 g/dL   AST 30 15 - 41  U/L   ALT 64 (H) 0 - 44 U/L   Alkaline Phosphatase 50 38 - 126 U/L   Total Bilirubin 0.8 0.3 - 1.2 mg/dL   GFR, Estimated >60 >60 mL/min    Comment: (NOTE) Calculated using the CKD-EPI Creatinine Equation (2021)    Anion gap 9 5 - 15    Comment: Performed at [facility], [place] [address], [place], [place] [address]  Lipase, blood     Status: None   Collection Time: 01/01/23 11:16 AM  Result Value Ref Range   Lipase 31 11 - 51 U/L    Comment: Performed at [facility], [place] [address] [place], [place] [address]  CBC with Diff     Status: Abnormal   Collection Time: 01/01/23 11:16 AM  Result Value Ref Range   WBC 15.8 (H) 4.0 - 10.5 K/uL   RBC 5.32 (H) 3.87 - 5.11 MIL/uL   Hemoglobin 15.7 (H) 12.0 - 15.0 g/dL   HCT 45.3 36.0 - 46.0 %   MCV 85.2 80.0 - 100.0 fL   MCH 29.5 26.0 - 34.0 pg   MCHC 34.7 30.0 - 36.0 g/dL   RDW 14.5 11.5 - 15.5 %   Platelets 279 150 - 400 K/uL   nRBC 0.0 0.0 - 0.2 %   Neutrophils Relative % 89 %   Neutro Abs 14.0 (H) 1.7 - 7.7 K/uL   Lymphocytes Relative 8 %   Lymphs Abs 1.3 0.7 - 4.0 K/uL   Monocytes Relative 3 %   Monocytes Absolute 0.4 0.1 - 1.0 K/uL   Eosinophils Relative 0 %   Eosinophils Absolute 0.0 0.0 - 0.5 K/uL   Basophils Relative 0 %   Basophils Absolute 0.0 0.0 - 0.1 K/uL   Immature Granulocytes 0 %   Abs Immature Granulocytes 0.04 0.00 - 0.07 K/uL    Comment: Performed at [facility], [place] [address] [place], [place] [address]  I-Stat beta hCG blood, ED     Status: None   Collection Time: 01/01/23 11:20 AM  Result Value Ref Range   I-stat hCG, quantitative <5.0 <5 mIU/mL   Comment 3            Comment:   GEST. AGE      CONC.  (mIU/mL)   <=1 WEEK        5 - 50     2 WEEKS       50 - 500     3 WEEKS       100 - 10,000     4 WEEKS     1,000 - 30,000        FEMALE AND NON-PREGNANT FEMALE:     LESS THAN 5 mIU/mL  CT ABDOMEN PELVIS W CONTRAST  Result Date:  01/01/2023 CLINICAL DATA:  Crohn's, abdominal pain, nausea and vomiting. EXAM: CT ABDOMEN AND PELVIS WITH CONTRAST TECHNIQUE: Multidetector CT imaging of the abdomen and pelvis was performed using the standard protocol following bolus administration of intravenous contrast. RADIATION DOSE REDUCTION: This exam was performed according to the departmental dose-optimization program which includes automated exposure control, adjustment of the mA and/or kV according to patient size and/or use of iterative reconstruction technique. CONTRAST:  141m OMNIPAQUE IOHEXOL 300 MG/ML  SOLN COMPARISON:  CT abdomen dated 07/11/2022 FINDINGS: Lower chest: No acute abnormality. Hepatobiliary: No focal liver abnormality is seen. Gallbladder is unremarkable. No bile duct dilatation. Pancreas: Unremarkable. No pancreatic ductal dilatation or surrounding inflammatory changes. Spleen: Normal in size without focal abnormality. Adrenals/Urinary Tract: Adrenal glands appear normal. Kidneys appear normal without mass, stone or hydronephrosis. No perinephric fluid. Bladder is unremarkable. Stomach/Bowel: Markedly distended fluid-filled small bowel loops throughout the abdomen and pelvis, with associated air-fluid levels, consistent with a complete small-bowel obstruction. As there are no decompressed small bowel loops appreciated within the abdomen or pelvis, the obstruction is likely located just deep to, or just proximal to, the RIGHT abdominal ostomy. Stomach and most proximal portion of the small bowel is relatively normal in caliber. The colon, beyond the ostomy site, is decompressed. Vascular/Lymphatic: No abdominal aortic aneurysm. No acute-appearing vascular abnormality. No enlarged lymph nodes are seen. Reproductive: Uterus and bilateral adnexa are unremarkable. Other: Small amount of free fluid in the pelvis. No abscess collection is seen. No free intraperitoneal air. Musculoskeletal: No osseous abnormality. IMPRESSION: 1. Markedly  distended fluid-filled small bowel loops throughout the abdomen and pelvis, with associated air-fluid levels, consistent with a complete small bowel obstruction. The small-bowel obstruction is likely located just deep to, or just proximal to, the RIGHT abdominal ostomy site. No obstructing mass is identified (adhesions?). 2. Small amount of free fluid in the pelvis. No abscess collection is seen. No free intraperitoneal air. These results were discussed by telephone at the time of interpretation on 01/01/2023 at 1:12 pm with provider RMargaretmary Eddy, who verbally acknowledged these results. Electronically Signed   By: SFranki CabotM.D.   On: 01/01/2023 13:15    Pending Labs Unresulted Labs (From admission, onward)     Start     Ordered   01/02/23 0500  CBC  Tomorrow morning,   R        01/01/23 1413   01/02/23 [id number] Basic metabolic panel  Tomorrow morning,   R        01/01/23 1413            Vitals/Pain Today's Vitals   01/01/23 1330 01/01/23 1345 01/01/23 1400 01/01/23 1429  BP: 123/83 112/68 111/75   Pulse: 68 70 (!) 58   Resp:   16   Temp:    98 F (36.7 C)  TempSrc:    Oral  SpO2: 100% 96% 99%   PainSc:        Isolation Precautions No active isolations  Medications Medications  potassium chloride 10 mEq in 100 mL IVPB (10 mEq Intravenous New Bag/Given 01/01/23 1345)  lidocaine (XYLOCAINE) 2 % viscous mouth solution 15 mL (has no administration in time range)  0.9 %  sodium chloride infusion (10 mLs Intravenous New Bag/Given 01/01/23 1339)  diatrizoate meglumine-sodium (GASTROGRAFIN) 66-10 % solution 90 mL (has no administration in time range)  0.9 % NaCl with KCl 40 mEq / L  infusion (has no administration in  time range)  morphine (PF) 2 MG/ML injection 2 mg (has no administration in time range)  ondansetron (ZOFRAN) tablet 4 mg (has no administration in time range)    Or  ondansetron (ZOFRAN) injection 4 mg (has no administration in time range)  pantoprazole (PROTONIX)  injection 40 mg (has no administration in time range)  HYDROmorphone (DILAUDID) injection 0.5 mg (0.5 mg Intravenous Given 01/01/23 1113)  lactated ringers bolus 1,000 mL (0 mLs Intravenous Stopped 01/01/23 1330)  iohexol (OMNIPAQUE) 300 MG/ML solution 100 mL (100 mLs Intravenous Contrast Given 01/01/23 1252)  HYDROmorphone (DILAUDID) injection 0.5 mg (0.5 mg Intravenous Given 01/01/23 1338)  ondansetron (ZOFRAN) injection 4 mg (4 mg Intravenous Given 01/01/23 1339)    Mobility walks     Focused Assessments SBO   R Recommendations: See Admitting Provider Note  Report given to:   Additional Notes: .

## 2023-01-01 NOTE — Consult Note (Signed)
[place] Nurse ostomy consult note Stoma type/location: RLQ ileostomy Stomal assessment/size: At time of last visit, 1 and 1/2 inches, slightly above skin level (not seen today) Peristomal assessment: Not seen today Treatment options for stomal/peristomal skin: skin barrier ring, convex pouching system Output Not seen today Ostomy pouching: 1pc.flexible convex pouch Kellie Simmering # [phone]) plus skin barrier ring Kellie Simmering # [phone])  Education provided: None. Patient with established ostomy. Enrolled patient in [employer] program: Yes, previously   [place] nursing team will not follow, but will remain available to this patient, the nursing and medical teams.  Please re-consult if needed.  Thank you for inviting [place] to participate in this patient's Plan of Care.  Maudie Flakes, MSN, RN, CNS, [place], Serita Grammes, [employer], [employer] phone:  [phone]

## 2023-01-01 NOTE — Assessment & Plan Note (Signed)
Last hemoglobin A1c 6.3 Patient is not on insulin or any oral hypoglycemic agent Check blood sugars every 4 hours while NPO

## 2023-01-01 NOTE — ED Triage Notes (Signed)
EMS reports from home, Hx of Crohn's with Ostomy X 6 months. Pt c/o nausea and abdominal pain and decreased output x 2 days..  BP 120/78 HR 70 RR 18 Sp02 98 RA CBG 175  18ga LAC 500 ml NS  '4mg'$  Zofran enroute.

## 2023-01-02 ENCOUNTER — Inpatient Hospital Stay (HOSPITAL_COMMUNITY): Payer: Medicaid Other

## 2023-01-02 DIAGNOSIS — K56609 Unspecified intestinal obstruction, unspecified as to partial versus complete obstruction: Secondary | ICD-10-CM

## 2023-01-02 LAB — BASIC METABOLIC PANEL
Anion gap: 7 (ref 5–15)
BUN: 17 mg/dL (ref 6–20)
CO2: 26 mmol/L (ref 22–32)
Calcium: 9 mg/dL (ref 8.9–10.3)
Chloride: 105 mmol/L (ref 98–111)
Creatinine, Ser: 0.8 mg/dL (ref 0.44–1.00)
GFR, Estimated: >60 mL/min (ref >60)
Glucose, Bld: 122 mg/dL — ABNORMAL HIGH (ref 70–99)
Potassium: 4 mmol/L (ref 3.5–5.1)
Sodium: 138 mmol/L (ref 135–145)

## 2023-01-02 LAB — GLUCOSE, CAPILLARY
Glucose-Capillary: 109 mg/dL — ABNORMAL HIGH (ref 70–99)
Glucose-Capillary: 110 mg/dL — ABNORMAL HIGH (ref 70–99)
Glucose-Capillary: 113 mg/dL — ABNORMAL HIGH (ref 70–99)
Glucose-Capillary: 87 mg/dL (ref 70–99)
Glucose-Capillary: 94 mg/dL (ref 70–99)
Glucose-Capillary: 97 mg/dL (ref 70–99)

## 2023-01-02 LAB — CBC
HCT: 41.9 % (ref 36.0–46.0)
Hemoglobin: 14.2 g/dL (ref 12.0–15.0)
MCH: 29.5 pg (ref 26.0–34.0)
MCHC: 33.9 g/dL (ref 30.0–36.0)
MCV: 87.1 fL (ref 80.0–100.0)
Platelets: 261 10*3/uL (ref 150–400)
RBC: 4.81 MIL/uL (ref 3.87–5.11)
RDW: 14.9 % (ref 11.5–15.5)
WBC: 11.8 10*3/uL — ABNORMAL HIGH (ref 4.0–10.5)
nRBC: 0 % (ref 0.0–0.2)

## 2023-01-02 MED ORDER — HYDROMORPHONE HCL 1 MG/ML IJ SOLN
0.5000 mg | INTRAMUSCULAR | Status: AC | PRN
  Administered 2023-01-02 (×2): 0.5 mg via INTRAVENOUS
  Filled 2023-01-02 (×2): qty 0.5

## 2023-01-02 MED ORDER — HYDROMORPHONE HCL 1 MG/ML IJ SOLN
0.5000 mg | INTRAMUSCULAR | Status: DC | PRN
  Administered 2023-01-02 – 2023-01-03 (×8): 1 mg via INTRAVENOUS
  Filled 2023-01-02 (×8): qty 1

## 2023-01-02 NOTE — Plan of Care (Signed)
Verbal education provided

## 2023-01-02 NOTE — Progress Notes (Signed)
  Transition of Care (TOC) Screening Note   Patient Details  Name: Sandra Foster Date of Birth: [birth year]   Transition of Care [facility]) CM/SW Contact:    Vassie Moselle, LCSW Phone Number: 01/02/2023, 9:35 AM    Transition of Care Department [facility]) has reviewed patient and no TOC needs have been identified at this time. We will continue to monitor patient advancement through interdisciplinary progression rounds. If new patient transition needs arise, please place a TOC consult.

## 2023-01-02 NOTE — Progress Notes (Signed)
Patient ID: Sandra Foster, female   DOB: 1991/10/17, 32 y.o.   MRN: [id number] [facility] Surgery Progress Note     Subjective: CC-  Vomited once yesterday while NG was clamped after gastrograffin administration. Still feels bloated and is having abdominal pain requiring pain medication. NG with high output. Small amount of flatus and stool from stoma.  Objective: Vital signs in last 24 hours: Temp:  [97.8 F (36.6 C)-98.2 F (36.8 C)] 98 F (36.7 C) (03/06 0419) Pulse Rate:  [57-76] 61 (03/06 0419) Resp:  [16-20] 17 (03/06 0419) BP: (92-123)/(50-83) 92/50 (03/06 0419) SpO2:  [96 %-100 %] 99 % (03/06 0419) Last BM Date : 01/01/23  Intake/Output from previous day: 03/05 0701 - 03/06 0700 In: 1379.2 [I.V.:1379.2] Out: 1050 [Emesis/NG output:1050] Intake/Output this shift: Total I/O In: -  Out: 1200 [Emesis/NG output:1200]  PE: Gen:  Alert, NAD, pleasant Abd: soft, mild distension, mild diffuse tenderness, stoma healthy/ able to digitize with small finger and get below level of fascia but it does feel very tight at fascial level/ red rubber catheter inserted and 475cc thin stool evacuated   Lab Results:  Recent Labs    01/01/23 1116 01/02/23 0600  WBC 15.8* 11.8*  HGB 15.7* 14.2  HCT 45.3 41.9  PLT 279 261   BMET Recent Labs    01/01/23 1116 01/02/23 0600  NA 134* 138  K 3.2* 4.0  CL 102 105  CO2 23 26  GLUCOSE 165* 122*  BUN 17 17  CREATININE 0.81 0.80  CALCIUM 9.7 9.0   PT/INR No results for input(s): "LABPROT", "INR" in the last 72 hours. CMP     Component Value Date/Time   NA 138 01/02/2023 0600   K 4.0 01/02/2023 0600   CL 105 01/02/2023 0600   CO2 26 01/02/2023 0600   GLUCOSE 122 (H) 01/02/2023 0600   BUN 17 01/02/2023 0600   CREATININE 0.80 01/02/2023 0600   CALCIUM 9.0 01/02/2023 0600   PROT 8.6 (H) 01/01/2023 1116   ALBUMIN 5.0 01/01/2023 1116   AST 30 01/01/2023 1116   ALT 64 (H) 01/01/2023 1116   ALKPHOS 50 01/01/2023 1116    BILITOT 0.8 01/01/2023 1116   GFRNONAA >60 01/02/2023 0600   GFRAA >60 08/28/2015 2150   Lipase     Component Value Date/Time   LIPASE 31 01/01/2023 1116       Studies/Results: DG Abd Portable 1V-Small Bowel Obstruction Protocol-initial, 8 hr delay  Result Date: 01/02/2023 CLINICAL DATA:  32 year old female with possible small bowel obstruction. 8 hour postcontrast film. EXAM: PORTABLE ABDOMEN - 1 VIEW COMPARISON:  Abdominal radiograph 01/01/2023. FINDINGS: Nasogastric tube tip in the proximal stomach with side port near the gastroesophageal junction. Paucity of bowel gas, with some mildly dilated loops of small bowel projecting over the right-side of the abdomen measuring up to 4 cm in diameter. No appreciable oral contrast material noted within the bowel. Iodinated contrast material is present within the lumen of the urinary bladder, presumably from recent contrast enhanced CT examination. No substantial colonic gas noted. IMPRESSION: 1. Support apparatus, as above. 2. Findings compatible with persistent small bowel obstruction, as above. Electronically Signed   By: Vinnie Langton M.D.   On: 01/02/2023 06:26   DG Abd Portable 1V-Small Bowel Protocol-Position Verification  Result Date: 01/01/2023 CLINICAL DATA:  Nasogastric tube placement. EXAM: PORTABLE ABDOMEN - 1 VIEW COMPARISON:  April 22, 2022. FINDINGS: The bowel gas pattern is normal. Distal tip of nasogastric tube is seen in expected position  of proximal stomach. Residual contrast is seen within dilated intrarenal collecting systems and proximal ureters and urinary bladder. IMPRESSION: Distal tip of nasogastric tube seen in expected position of proximal stomach. Electronically Signed   By: Marijo Conception M.D.   On: 01/01/2023 16:43   CT ABDOMEN PELVIS W CONTRAST  Result Date: 01/01/2023 CLINICAL DATA:  Crohn's, abdominal pain, nausea and vomiting. EXAM: CT ABDOMEN AND PELVIS WITH CONTRAST TECHNIQUE: Multidetector CT imaging of the  abdomen and pelvis was performed using the standard protocol following bolus administration of intravenous contrast. RADIATION DOSE REDUCTION: This exam was performed according to the departmental dose-optimization program which includes automated exposure control, adjustment of the mA and/or kV according to patient size and/or use of iterative reconstruction technique. CONTRAST:  110m OMNIPAQUE IOHEXOL 300 MG/ML  SOLN COMPARISON:  CT abdomen dated 07/11/2022 FINDINGS: Lower chest: No acute abnormality. Hepatobiliary: No focal liver abnormality is seen. Gallbladder is unremarkable. No bile duct dilatation. Pancreas: Unremarkable. No pancreatic ductal dilatation or surrounding inflammatory changes. Spleen: Normal in size without focal abnormality. Adrenals/Urinary Tract: Adrenal glands appear normal. Kidneys appear normal without mass, stone or hydronephrosis. No perinephric fluid. Bladder is unremarkable. Stomach/Bowel: Markedly distended fluid-filled small bowel loops throughout the abdomen and pelvis, with associated air-fluid levels, consistent with a complete small-bowel obstruction. As there are no decompressed small bowel loops appreciated within the abdomen or pelvis, the obstruction is likely located just deep to, or just proximal to, the RIGHT abdominal ostomy. Stomach and most proximal portion of the small bowel is relatively normal in caliber. The colon, beyond the ostomy site, is decompressed. Vascular/Lymphatic: No abdominal aortic aneurysm. No acute-appearing vascular abnormality. No enlarged lymph nodes are seen. Reproductive: Uterus and bilateral adnexa are unremarkable. Other: Small amount of free fluid in the pelvis. No abscess collection is seen. No free intraperitoneal air. Musculoskeletal: No osseous abnormality. IMPRESSION: 1. Markedly distended fluid-filled small bowel loops throughout the abdomen and pelvis, with associated air-fluid levels, consistent with a complete small bowel  obstruction. The small-bowel obstruction is likely located just deep to, or just proximal to, the RIGHT abdominal ostomy site. No obstructing mass is identified (adhesions?). 2. Small amount of free fluid in the pelvis. No abscess collection is seen. No free intraperitoneal air. These results were discussed by telephone at the time of interpretation on 01/01/2023 at 1:12 pm with provider RMargaretmary Eddy, who verbally acknowledged these results. Electronically Signed   By: SFranki CabotM.D.   On: 01/01/2023 13:15    Anti-infectives: Anti-infectives (From admission, onward)    None        Assessment/Plan -SBO -Hx ex lap right hemicolectomy and end ileostomy 07/13/22 by Dr. WDema Severinfor perforating ileocolic Crohn's disease - CT scan 3/5 shows markedly distended fluid-filled small bowel loops throughout the abdomen and pelvis with associated air-fluid levels, consistent with a complete small bowel obstruction; the small-bowel obstruction is likely located just deep to, or just proximal to, the RIGHT abdominal ostomy site and possibly due to adhesions; no obstructing mass is identified; small amount of free fluid in the pelvis; no abscess or free intraperitoneal air - Able to digitize stoma but it does feel very tight at fascial level. Able to advance red rubber catheter per stoma with 475cc return of thin stool. Continue bowel rest/NG to LIWS today. Continue red rubber per stoma. Hopefully edema will gradually decrease and she will continue to have bowel function.   ID - none  VTE - SCDs, ok for chemical dvt ppx from  surgical standpoint FEN - IVF, NPO/NGT to LIWS Foley - none Follow up - Scheduled for ileostomy takedown 01/09/23 with Dr. Dema Severin  I reviewed Consultant gastroenterology notes, last 24 h vitals and pain scores, last 48 h intake and output, last 24 h labs and trends, and last 24 h imaging results.    LOS: 1 day    [place]ellington [place], [facility] Surgery 01/02/2023, 10:43  AM Please see Amion for pager number during day hours 7:00am-4:30pm

## 2023-01-02 NOTE — Progress Notes (Signed)
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  On presentation, lab work showed elevated white cell count of 15.8, potassium 3.2.  CT abdomen/pelvis showed marked distended fluid-filled small bowel loops throughout the abdomen/pelvis consistent with complete small bowel obstruction.  General surgery, GI consulted.  Patient being managed for SBO.  Assessment & Plan:  Principal Problem:   SBO (small bowel obstruction) (HCC) Active Problems:   Crohn's disease ([place])   Hypokalemia   Controlled type 2 diabetes mellitus without complication, without long-term current use of insulin (HCC)   Gastroesophageal reflux disease  SBO: Presented with 2-day history of nausea, abdominal pain, decreased output.  Images showed mildly distended fluid-filled small bowel loops throughout abdomen/pelvis consistent with a small bowel obstruction.  No obstruction was identified.  Currently being managed for SBO.  On NG tube.  Maintain n.p.o. status.  Continue IV fluids, antiemetics, PPI.  General surgery following. Abdominal x-ray done this morning still shows persistent SBO.  Crohn's disease: History of Crohn's disease with right hemicolectomy with end ileostomy because of perforated ileocolic Crohn's with mesenteric abscess.  Currently on monthly infusion of Remicade.  GI consulted here, currently her condition is thought not to be secondary to Crohn's disease  Hypokalemia: Currently being supplemented and monitored  GERD: Continue PPI  Leukocytosis: Mild, continue to monitor          DVT prophylaxis:SCDs Start: 01/01/23 1409     Code Status: Full Code  Family Communication: None at bedside  Patient status:Inpatient  Patient is from :Home  Anticipated discharge RC:[id number]  Estimated DC date:not sure   Consultants: General surgery, GI  Procedures: None  Antimicrobials:  Anti-infectives (From admission, onward)    None       Subjective: Patient seen and examined at bedside today.  Hemodynamically stable.  Lying in bed.  Not in apparent distress.  Denies any worsening abdominal pain, nausea or vomiting.  Still has some abdominal discomfort.  Not passing gas  Objective: Vitals:   01/01/23 1523 01/01/23 1928 01/01/23 2322 01/02/23 0419  BP: 113/74 111/69 109/67 (!) 92/50  Pulse: (!) 57 68 69 61  Resp: '20 17 18 17  '$ Temp: 97.8 F (36.6 C) 98.1 F (36.7 C) 98.2 F (36.8 C) 98 F (36.7 C)  TempSrc: Oral Oral Oral   SpO2: 100% 100% 100% 99%    Intake/Output Summary (Last 24 hours) at 01/02/2023 0825 Last data filed at 01/02/2023 0500 Gross per 24 hour  Intake 1379.23 ml  Output 1050 ml  Net 329.23 ml   There were no vitals filed for this visit.  Examination:  General exam: Overall comfortable, not in distress HEENT: PERRL,NG tube Respiratory system:  no wheezes or crackles  Cardiovascular system: S1 & S2 heard, RRR.  Gastrointestinal system: Abdomen is slightly distended, soft and generalized mild tenderness.Ileostomy, no bowel sounds heard Central nervous system: Alert and oriented Extremities: No edema, no clubbing ,no cyanosis Skin: No rashes, no ulcers,no icterus     Data Reviewed: I have personally reviewed following labs and imaging studies  CBC: Recent Labs  Lab 12/28/22 0953 01/01/23 1116 01/02/23 0600  WBC 5.4 15.8* 11.8*  NEUTROABS 2.1 14.0*  --   HGB 13.4 15.7* 14.2  HCT 39.3 45.3 41.9  MCV 86.2 85.2 87.1  PLT 205 279 0000000   Basic Metabolic Panel: Recent Labs  Lab 12/28/22 0953 01/01/23 1116 01/02/23 0600  NA 138  134* 138  K 3.4* 3.2* 4.0  CL 108 102 105  CO2 '24 23 26  '$ GLUCOSE 108* 165* 122*  BUN '8 17 17  '$ CREATININE 0.74 0.81 0.80  CALCIUM 8.8* 9.7 9.0     No results found for this or any previous visit (from the past 240 hour(s)).   Radiology Studies: DG Abd Portable 1V-Small Bowel Obstruction Protocol-initial, 8 hr delay  Result Date: 01/02/2023 CLINICAL DATA:  32 year old female with possible small bowel obstruction. 8 hour postcontrast film. EXAM: PORTABLE ABDOMEN - 1 VIEW COMPARISON:  Abdominal radiograph 01/01/2023. FINDINGS: Nasogastric tube tip in the proximal stomach with side port near the gastroesophageal junction. Paucity of bowel gas, with some mildly dilated loops of small bowel projecting over the right-side of the abdomen measuring up to 4 cm in diameter. No appreciable oral contrast material noted within the bowel. Iodinated contrast material is present within the lumen of the urinary bladder, presumably from recent contrast enhanced CT examination. No substantial colonic gas noted. IMPRESSION: 1. Support apparatus, as above. 2. Findings compatible with persistent small bowel obstruction, as above. Electronically Signed   By: Vinnie Langton M.D.   On: 01/02/2023 06:26   DG Abd Portable 1V-Small Bowel Protocol-Position Verification  Result Date: 01/01/2023 CLINICAL DATA:  Nasogastric tube placement. EXAM: PORTABLE ABDOMEN - 1 VIEW COMPARISON:  April 22, 2022. FINDINGS: The bowel gas pattern is normal. Distal tip of nasogastric tube is seen in expected position of proximal stomach. Residual contrast is seen within dilated intrarenal collecting systems and proximal ureters and urinary bladder. IMPRESSION: Distal tip of nasogastric tube seen in expected position of proximal stomach. Electronically Signed   By: Marijo Conception M.D.   On: 01/01/2023 16:43   CT ABDOMEN PELVIS W CONTRAST  Result Date: 01/01/2023 CLINICAL DATA:  Crohn's, abdominal pain, nausea and vomiting. EXAM: CT ABDOMEN AND  PELVIS WITH CONTRAST TECHNIQUE: Multidetector CT imaging of the abdomen and pelvis was performed using the standard protocol following bolus administration of intravenous contrast. RADIATION DOSE REDUCTION: This exam was performed according to the departmental dose-optimization program which includes automated exposure control, adjustment of the mA and/or kV according to patient size and/or use of iterative reconstruction technique. CONTRAST:  120m OMNIPAQUE IOHEXOL 300 MG/ML  SOLN COMPARISON:  CT abdomen dated 07/11/2022 FINDINGS: Lower chest: No acute abnormality. Hepatobiliary: No focal liver abnormality is seen. Gallbladder is unremarkable. No bile duct dilatation. Pancreas: Unremarkable. No pancreatic ductal dilatation or surrounding inflammatory changes. Spleen: Normal in size without focal abnormality. Adrenals/Urinary Tract: Adrenal glands appear normal. Kidneys appear normal without mass, stone or hydronephrosis. No perinephric fluid. Bladder is unremarkable. Stomach/Bowel: Markedly distended fluid-filled small bowel loops throughout the abdomen and pelvis, with associated air-fluid levels, consistent with a complete small-bowel obstruction. As there are no decompressed small bowel loops appreciated within the abdomen or pelvis, the obstruction is likely located just deep to, or just proximal to, the RIGHT abdominal ostomy. Stomach and most proximal portion of the small bowel is relatively normal in caliber. The colon, beyond the ostomy site, is decompressed. Vascular/Lymphatic: No abdominal aortic aneurysm. No acute-appearing vascular abnormality. No enlarged lymph nodes are seen. Reproductive: Uterus and bilateral adnexa are unremarkable. Other: Small amount of free fluid in the pelvis.  No abscess collection is seen. No free intraperitoneal air. Musculoskeletal: No osseous abnormality. IMPRESSION: 1. Markedly distended fluid-filled small bowel loops throughout the abdomen and pelvis, with associated  air-fluid levels, consistent with a complete small bowel obstruction. The small-bowel obstruction is likely located just deep to, or just proximal to, the RIGHT abdominal ostomy site. No obstructing mass is identified (adhesions?). 2. Small amount of free fluid in the pelvis. No abscess collection is seen. No free intraperitoneal air. These results were discussed by telephone at the time of interpretation on 01/01/2023 at 1:12 pm with provider Margaretmary Eddy , who verbally acknowledged these results. Electronically Signed   By: Franki Cabot M.D.   On: 01/01/2023 13:15    Scheduled Meds:  lidocaine  15 mL Mouth/Throat Once   pantoprazole (PROTONIX) IV  40 mg Intravenous Daily   prochlorperazine  5 mg Intravenous Once   Continuous Infusions:  sodium chloride 10 mL (01/01/23 1339)   0.9 % NaCl with KCl 40 mEq / L 100 mL/hr at 01/02/23 0242     LOS: 1 day   Shelly Coss, MD [facility] P3/03/2023, 8:25 AM

## 2023-01-02 NOTE — Progress Notes (Signed)
Initial Nutrition Assessment  INTERVENTION:   Once diet advanced: -Ensure Plus High Protein po BID, each supplement provides 350 kcal and 20 grams of protein.   -If diet unable to be advanced by LOS day 7 (3/12), recommend TPN.  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 Admitted for SBO.  Patient in room, no visitors at bedside. Pt states she is now starting to feel better following surgery advancing red rubber catheter. Has had large amounts output ~2250 ml since NGT placement yesterday.  Pt states she has been eating much better and has even had some weight gain since surgery in September 2023. Pt does not eat breakfast but will eat some applesauce in the morning to have something on her stomach. For lunch she typically has something from a fast food restaurant, varies. For dinner she cooks a meal, sometimes chicken with rice and broccoli, mac and cheese or tacos. She tries to drink a lot of water and drinks Gatorade on occasion. Reports she tolerates dairy. She is agreeable to receiving protein supplements once diet is advanced.  Pt currently NPO for bowel rest. Surgery was scheduled for 3/13 for ileostomy takedown.  Patient states she weighed ~118 lbs when she was first diagnosed with Crohn's. In September 2023 around the time of  her surgery she weighed ~122 lbs.  Current weight: 150 lbs.  Medications: Zofran  Labs reviewed: CBGs: 109-120   NUTRITION - FOCUSED PHYSICAL EXAM:  No depletions noted.  Diet Order:   Diet Order             Diet NPO time specified  Diet effective now                   EDUCATION NEEDS:   No education needs have been identified at this time  Skin:  Skin Assessment: Reviewed RN Assessment  Last BM:  3/5  Height:   Ht Readings from Last 1 Encounters:  12/28/22 '5\' 6"'$  (1.676 m)    Weight:   Wt Readings from Last 1 Encounters:  12/28/22 68.5 kg    BMI:  24.2 kg/m^2  Estimated Nutritional Needs:   Kcal:  1750-1950  Protein:  85-95g  Fluid:  >1.9L/day  Clayton Bibles, MS, RD, LDN Inpatient Clinical Dietitian Contact information available via Amion

## 2023-01-03 DIAGNOSIS — K56609 Unspecified intestinal obstruction, unspecified as to partial versus complete obstruction: Secondary | ICD-10-CM | POA: Diagnosis not present

## 2023-01-03 LAB — BASIC METABOLIC PANEL
Anion gap: 7 (ref 5–15)
BUN: 18 mg/dL (ref 6–20)
CO2: 27 mmol/L (ref 22–32)
Calcium: 8.3 mg/dL — ABNORMAL LOW (ref 8.9–10.3)
Chloride: 103 mmol/L (ref 98–111)
Creatinine, Ser: 0.81 mg/dL (ref 0.44–1.00)
GFR, Estimated: >60 mL/min (ref >60)
Glucose, Bld: 95 mg/dL (ref 70–99)
Potassium: 3.8 mmol/L (ref 3.5–5.1)
Sodium: 137 mmol/L (ref 135–145)

## 2023-01-03 LAB — GLUCOSE, CAPILLARY
Glucose-Capillary: 85 mg/dL (ref 70–99)
Glucose-Capillary: 78 mg/dL (ref 70–99)
Glucose-Capillary: 135 mg/dL — ABNORMAL HIGH (ref 70–99)

## 2023-01-03 MED ORDER — DIPHENHYDRAMINE HCL 50 MG/ML IJ SOLN
25.0000 mg | Freq: Once | INTRAMUSCULAR | Status: AC
  Administered 2023-01-03: 25 mg via INTRAVENOUS
  Filled 2023-01-03: qty 1

## 2023-01-03 MED ORDER — SODIUM CHLORIDE 0.9 % IV SOLN: INTRAVENOUS | Status: DC

## 2023-01-03 NOTE — Discharge Summary (Signed)
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 On presentation, lab work showed elevated white cell count of 15.8, potassium 3.2. CT abdomen/pelvis showed marked distended fluid-filled small bowel loops throughout the abdomen/pelvis consistent with complete small bowel obstruction. General surgery, GI consulted.  Patient's overall status gradually improved.  She started passing gas, had liquidy stool in the ileostomy.  Abdominal pain, nausea or vomiting completely resolved.  General surgery cleared for discharge today.   Following problems were addressed during the hospitalization:  SBO: Presented with 2-day history of nausea, abdominal pain, decreased output.  Images showed mildly distended fluid-filled small bowel loops throughout abdomen/pelvis consistent with a small bowel obstruction.  No obstructing mass was identified.  Started on conservative management with NG tube,IV fluids, antiemetics, PPI.  General surgery were following. General surgery digitalized stoma and was able to advance red rubber  catheter  with return of stool.  Then she  she had liquid bowel movement after that.She does not have any abdominal pain, nausea or vomiting. She is passing gas.Tolerated full liquid diet   Crohn's disease: History of Crohn's disease with right hemicolectomy with end ileostomy because of perforated  ileocolic Crohn's with mesenteric abscess.  Currently on monthly infusion of Remicade.  GI consulted here, currently her condition is thought not to be secondary to Crohn's disease   Hypokalemia: Supplemented and corrected   Leukocytosis: Mild, likely reactive   Discharge Diagnoses:  Principal Problem:   SBO (small bowel obstruction) ([place]) Active Problems:   Crohn's disease ([place])   Hypokalemia   Controlled type 2 diabetes mellitus without complication, without long-term current use of insulin ([place])   Gastroesophageal reflux disease    Discharge Instructions  Discharge Instructions     Diet general   Complete by: As directed    Discharge instructions   Complete by: As directed    1)Please follow up with your PCP in a week   Increase activity slowly   Complete by: As directed       Allergies as of 01/03/2023       Reactions   Nsaids Other (See Comments)   Crohn's disease = NO NSAIDs        Medication List     STOP taking these medications    polycarbophil 625 MG tablet Commonly known as: FIBERCON       TAKE these medications    acetaminophen 500 MG tablet Commonly known as: TYLENOL Take 1 tablet (500 mg total) by mouth every 6 (six) hours as needed. What changed:  how much to take when to take this reasons to take this   metroNIDAZOLE 500 MG tablet Commonly known as: FLAGYL Take by mouth.   neomycin 500 MG tablet Commonly known as: MYCIFRADIN Take 1,000 mg by mouth 3 (three) times daily.   Nexplanon 68 MG Impl implant Generic drug: etonogestrel 1 each by Subdermal route once.        Follow-up Information     [facility] [place] [facility]. Call.   Specialty: General Surgery  Why: As needed Contact information: [address] [place] [address] [phone]                Allergies  Allergen Reactions   Nsaids Other (See Comments)    Crohn's disease = NO NSAIDs    Consultations: Surgery   Procedures/Studies: DG  Abd Portable 1V-Small Bowel Obstruction Protocol-initial, 8 hr delay  Result Date: 01/02/2023 CLINICAL DATA:  32 year old female with possible small bowel obstruction. 8 hour postcontrast film. EXAM: PORTABLE ABDOMEN - 1 VIEW COMPARISON:  Abdominal radiograph 01/01/2023. FINDINGS: Nasogastric tube tip in the proximal stomach with side port near the gastroesophageal junction. Paucity of bowel gas, with some mildly dilated loops of small bowel projecting over the right-side of the abdomen measuring up to 4 cm in diameter. No appreciable oral contrast material noted within the bowel. Iodinated contrast material is present within the lumen of the urinary bladder, presumably from recent contrast enhanced CT examination. No substantial colonic gas noted. IMPRESSION: 1. Support apparatus, as above. 2. Findings compatible with persistent small bowel obstruction, as above. Electronically Signed   By: Vinnie Langton M.D.   On: 01/02/2023 06:26   DG Abd Portable 1V-Small Bowel Protocol-Position Verification  Result Date: 01/01/2023 CLINICAL DATA:  Nasogastric tube placement. EXAM: PORTABLE ABDOMEN - 1 VIEW COMPARISON:  April 22, 2022. FINDINGS: The bowel gas pattern is normal. Distal tip of nasogastric tube is seen in expected position of proximal stomach. Residual contrast is seen within dilated intrarenal collecting systems and proximal ureters and urinary bladder. IMPRESSION: Distal tip of nasogastric tube seen in expected position of proximal stomach. Electronically Signed   By: Marijo Conception M.D.   On: 01/01/2023 16:43   CT ABDOMEN PELVIS W CONTRAST  Result Date: 01/01/2023 CLINICAL DATA:  Crohn's, abdominal pain, nausea and vomiting. EXAM: CT ABDOMEN AND PELVIS WITH CONTRAST TECHNIQUE: Multidetector CT imaging of the abdomen and pelvis was performed using the standard protocol following bolus administration of intravenous contrast. RADIATION DOSE REDUCTION: This exam was performed according to the departmental  dose-optimization program which includes automated exposure control, adjustment of the mA and/or kV according to patient size and/or use of iterative reconstruction technique. CONTRAST:  153m OMNIPAQUE IOHEXOL 300 MG/ML  SOLN COMPARISON:  CT abdomen dated 07/11/2022 FINDINGS: Lower chest: No acute abnormality. Hepatobiliary: No focal liver abnormality is seen. Gallbladder is unremarkable. No bile duct dilatation. Pancreas: Unremarkable. No pancreatic ductal dilatation or surrounding inflammatory changes. Spleen: Normal in size without focal abnormality. Adrenals/Urinary Tract: Adrenal glands appear normal. Kidneys appear normal without mass, stone or hydronephrosis. No perinephric fluid. Bladder is unremarkable. Stomach/Bowel: Markedly distended fluid-filled small bowel loops throughout the abdomen and pelvis, with associated air-fluid levels, consistent with a complete small-bowel obstruction. As there are no decompressed small bowel loops appreciated within the abdomen or pelvis, the obstruction is likely located just deep to, or just proximal to, the RIGHT abdominal ostomy. Stomach and most proximal portion of the small bowel is relatively normal in caliber. The colon, beyond the ostomy site, is decompressed. Vascular/Lymphatic: No abdominal aortic aneurysm. No acute-appearing vascular abnormality. No enlarged lymph nodes are seen. Reproductive: Uterus and bilateral adnexa are unremarkable. Other: Small amount of free fluid in the pelvis. No abscess collection is seen. No free intraperitoneal air. Musculoskeletal: No osseous abnormality. IMPRESSION: 1. Markedly distended fluid-filled small bowel loops throughout the abdomen and pelvis, with associated air-fluid levels, consistent with a complete small bowel obstruction. The small-bowel obstruction is likely located just deep to, or  just proximal to, the RIGHT abdominal ostomy site. No obstructing mass is identified (adhesions?). 2. Small amount of free fluid in  the pelvis. No abscess collection is seen. No free intraperitoneal air. These results were discussed by telephone at the time of interpretation on 01/01/2023 at 1:12 pm with provider Margaretmary Eddy , who verbally acknowledged these results. Electronically Signed   By: Franki Cabot M.D.   On: 01/01/2023 13:15      Subjective: Patient seen and examined at bedside this morning.  She was comfortable, lying in bed.  Denies any abdomen pain or nausea or vomiting.  Passing gas, abdomen soft and nontender, with good bowel sounds.  General surgery cleared for discharge this afternoon  Discharge Exam: Vitals:   01/03/23 0433 01/03/23 1326  BP: (!) 100/54 104/66  Pulse: 63 77  Resp: 16 16  Temp: 98.1 F (36.7 C) 98.3 F (36.8 C)  SpO2: 100% 97%   Vitals:   01/02/23 1450 01/02/23 1945 01/03/23 0433 01/03/23 1326  BP: 116/72 120/69 (!) 100/54 104/66  Pulse: 64 67 63 77  Resp: '16 16 16 16  '$ Temp: 98.3 F (36.8 C) 97.9 F (36.6 C) 98.1 F (36.7 C) 98.3 F (36.8 C)  TempSrc: Oral     SpO2: 98% 98% 100% 97%    General: Pt is alert, awake, not in acute distress Cardiovascular: RRR, S1/S2 +, no rubs, no gallops Respiratory: CTA bilaterally, no wheezing, no rhonchi Abdominal: Soft, NT, ND, bowel sounds +,leostomy Extremities: no edema, no cyanosis    The results of significant diagnostics from this hospitalization (including imaging, microbiology, ancillary and laboratory) are listed below for reference.     Microbiology: No results found for this or any previous visit (from the past 240 hour(s)).   Labs: BNP (last 3 results) No results for input(s): "BNP" in the last 8760 hours. Basic Metabolic Panel: Recent Labs  Lab 12/28/22 0953 01/01/23 1116 01/02/23 0600 01/03/23 0540  NA 138 134* 138 137  K 3.4* 3.2* 4.0 3.8  CL 108 102 105 103  CO2 '24 23 26 27  '$ GLUCOSE 108* 165* 122* 95  BUN '8 17 17 18  '$ CREATININE 0.74 0.81 0.80 0.81  CALCIUM 8.8* 9.7 9.0 8.3*   Liver Function  Tests: Recent Labs  Lab 12/28/22 0953 01/01/23 1116  AST 32 30  ALT 47* 64*  ALKPHOS 42 50  BILITOT 0.8 0.8  PROT 6.9 8.6*  ALBUMIN 4.3 5.0   Recent Labs  Lab 01/01/23 1116  LIPASE 31   No results for input(s): "AMMONIA" in the last 168 hours. CBC: Recent Labs  Lab 12/28/22 0953 01/01/23 1116 01/02/23 0600  WBC 5.4 15.8* 11.8*  NEUTROABS 2.1 14.0*  --   HGB 13.4 15.7* 14.2  HCT 39.3 45.3 41.9  MCV 86.2 85.2 87.1  PLT 205 279 261   Cardiac Enzymes: No results for input(s): "CKTOTAL", "CKMB", "CKMBINDEX", "TROPONINI" in the last 168 hours. BNP: Invalid input(s): "POCBNP" CBG: Recent Labs  Lab 01/02/23 1946 01/02/23 2340 01/03/23 0429 01/03/23 0742 01/03/23 1139  GLUCAP 94 97 85 78 135*   D-Dimer No results for input(s): "DDIMER" in the last 72 hours. Hgb A1c No results for input(s): "HGBA1C" in the last 72 hours. Lipid Profile No results for input(s): "CHOL", "HDL", "LDLCALC", "TRIG", "CHOLHDL", "LDLDIRECT" in the last 72 hours. Thyroid function studies No results for input(s): "TSH", "T4TOTAL", "T3FREE", "THYROIDAB" in the last 72 hours.  Invalid input(s): "FREET3" Anemia work up No results for input(s): "VITAMINB12", "FOLATE", "FERRITIN", "  TIBC", "IRON", "RETICCTPCT" in the last 72 hours. Urinalysis    Component Value Date/Time   COLORURINE STRAW (A) 07/19/2022 2149   APPEARANCEUR CLEAR 07/19/2022 2149   LABSPEC 1.006 07/19/2022 2149   PHURINE 8.0 07/19/2022 2149   GLUCOSEU NEGATIVE 07/19/2022 2149   HGBUR MODERATE (A) 07/19/2022 2149   BILIRUBINUR NEGATIVE 07/19/2022 2149   KETONESUR NEGATIVE 07/19/2022 2149   PROTEINUR NEGATIVE 07/19/2022 2149   UROBILINOGEN 0.2 08/11/2015 2134   NITRITE NEGATIVE 07/19/2022 2149   LEUKOCYTESUR NEGATIVE 07/19/2022 2149   Sepsis Labs Recent Labs  Lab 12/28/22 0953 01/01/23 1116 01/02/23 0600  WBC 5.4 15.8* 11.8*   Microbiology No results found for this or any previous visit (from the past 240  hour(s)).  Please note: You were cared for by a hospitalist during your hospital stay. Once you are discharged, your primary care physician will handle any further medical issues. Please note that NO REFILLS for any discharge medications will be authorized once you are discharged, as it is imperative that you return to your primary care physician (or establish a relationship with a primary care physician if you do not have one) for your post hospital discharge needs so that they can reassess your need for medications and monitor your lab values.    Time coordinating discharge: 40 minutes  SIGNED:   Shelly Coss, MD  [facility] 01/03/2023, 4:27 PM Pager [phone]  If 7PM-7AM, please contact night-coverage [email or web address] Password [email or web address]

## 2023-01-03 NOTE — Progress Notes (Signed)
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  On presentation, lab work showed elevated white cell count of 15.8, potassium 3.2.  CT abdomen/pelvis showed marked distended fluid-filled small bowel loops throughout the abdomen/pelvis consistent with complete small bowel obstruction.  General surgery, GI consulted.  Patient being managed for SBO.  Assessment & Plan:  Principal Problem:   SBO (small bowel obstruction) ([facility]) Active Problems:   Crohn's disease ([place])   Hypokalemia   Controlled type 2 diabetes mellitus without complication, without long-term current use of insulin ([facility])   Gastroesophageal reflux disease  SBO: Presented with 2-day history of nausea, abdominal pain, decreased output.  Images showed mildly distended fluid-filled small bowel loops throughout abdomen/pelvis consistent with a small bowel obstruction.  No obstructing mass was identified.  Currently being managed for SBO.  On NG tube.  .  Continue IV fluids, antiemetics, PPI.  General surgery following. Abdominal x-ray done on 3/6 showed persistent SBO. General surgery digitalized stoma and was able to advance right upper catheter  with return of stool.  Patient is passing gas today.  She does not have any abdominal pain, nausea or vomiting.  Wants to take the NG tube out today. Diet advanced to full liquid  Crohn's disease: History of Crohn's disease with right hemicolectomy with end ileostomy because of perforated ileocolic Crohn's with mesenteric abscess.  Currently on monthly infusion of Remicade.   GI consulted here, currently her condition is thought not to be secondary to Crohn's disease  Hypokalemia: Currently being monitored and supplemented as needed  GERD: Continue PPI  Leukocytosis: Mild, continue to monitor    Nutrition Problem: Increased nutrient needs Etiology: chronic illness (Crohn's disease)    DVT prophylaxis:SCDs Start: 01/01/23 1409     Code Status: Full Code  Family Communication: None at bedside  Patient status:Inpatient  Patient is from :Home  Anticipated discharge [id number]  Estimated DC date:likely tomorrow   Consultants: General surgery, GI  Procedures: None  Antimicrobials:  Anti-infectives (From admission, onward)    None       Subjective: Patient seen and examined at bedside today.  Hemodynamically stable.  No nausea, vomiting and abdominal pain today.  Feels a lot better.  Wants the NG tube out.  Feels like she is passing gas to stoma bag  Objective: Vitals:   01/02/23 0419 01/02/23 1450 01/02/23 1945 01/03/23 0433  BP: (!) 92/50 116/72 120/69 (!) 100/54  Pulse: 61 64 67 63  Resp: '17 16 16 16  '$ Temp: 98 F (36.7 C) 98.3 F (36.8 C) 97.9 F (36.6 C) 98.1 F (36.7 C)  TempSrc:  Oral    SpO2: 99% 98% 98% 100%    Intake/Output Summary (Last 24 hours) at 01/03/2023 1023 Last data filed at 01/02/2023 2257 Gross per 24 hour  Intake 179.5 ml  Output 875 ml  Net -695.5 ml   There were no vitals filed for this visit.  Examination:   General exam: Overall comfortable, not in distress HEENT: PERRL,NG tube Respiratory system:  no wheezes or crackles  Cardiovascular system: S1 & S2 heard, RRR.  Gastrointestinal system: Abdomen is nondistended, nontender, bowel sounds  heard.  Ileostomy  Central nervous system: Alert and oriented Extremities: No edema, no clubbing ,no cyanosis Skin: No rashes, no ulcers,no icterus     Data Reviewed: I have personally reviewed following labs and imaging studies  CBC: Recent Labs  Lab  12/28/22 0953 01/01/23 1116 01/02/23 0600  WBC 5.4 15.8* 11.8*  NEUTROABS 2.1 14.0*  --   HGB 13.4 15.7* 14.2  HCT 39.3 45.3 41.9  MCV 86.2 85.2 87.1  PLT 205 279 0000000   Basic Metabolic Panel: Recent Labs  Lab 12/28/22 0953 01/01/23 1116 01/02/23 0600 01/03/23 0540  NA 138 134* 138 137  K 3.4* 3.2* 4.0 3.8  CL 108 102 105 103  CO2 '24 23 26 27  '$ GLUCOSE 108* 165* 122* 95  BUN '8 17 17 18  '$ CREATININE 0.74 0.81 0.80 0.81  CALCIUM 8.8* 9.7 9.0 8.3*     No results found for this or any previous visit (from the past 240 hour(s)).   Radiology Studies: DG Abd Portable 1V-Small Bowel Obstruction Protocol-initial, 8 hr delay  Result Date: 01/02/2023 CLINICAL DATA:  32 year old female with possible small bowel obstruction. 8 hour postcontrast film. EXAM: PORTABLE ABDOMEN - 1 VIEW COMPARISON:  Abdominal radiograph 01/01/2023. FINDINGS: Nasogastric tube tip in the proximal stomach with side port near the gastroesophageal junction. Paucity of bowel gas, with some mildly dilated loops of small bowel projecting over the right-side of the abdomen measuring up to 4 cm in diameter. No appreciable oral contrast material noted within the bowel. Iodinated contrast material is present within the lumen of the urinary bladder, presumably from recent contrast enhanced CT examination. No substantial colonic gas noted. IMPRESSION: 1. Support apparatus, as above. 2. Findings compatible with persistent small bowel obstruction, as above. Electronically Signed   By: Vinnie Langton M.D.   On: 01/02/2023 06:26   DG Abd Portable 1V-Small Bowel Protocol-Position Verification  Result Date: 01/01/2023 CLINICAL DATA:  Nasogastric tube placement. EXAM: PORTABLE ABDOMEN - 1 VIEW COMPARISON:  April 22, 2022. FINDINGS: The bowel gas pattern is normal. Distal tip of nasogastric tube is seen in expected position of proximal stomach. Residual contrast is seen within dilated intrarenal collecting systems and proximal ureters and  urinary bladder. IMPRESSION: Distal tip of nasogastric tube seen in expected position of proximal stomach. Electronically Signed   By: Marijo Conception M.D.   On: 01/01/2023 16:43   CT ABDOMEN PELVIS W CONTRAST  Result Date: 01/01/2023 CLINICAL DATA:  Crohn's, abdominal pain, nausea and vomiting. EXAM: CT ABDOMEN AND PELVIS WITH CONTRAST TECHNIQUE: Multidetector CT imaging of the abdomen and pelvis was performed using the standard protocol following bolus administration of intravenous contrast. RADIATION DOSE REDUCTION: This exam was performed according to the departmental dose-optimization program which includes automated exposure control, adjustment of the mA and/or kV according to patient size and/or use of iterative reconstruction technique. CONTRAST:  173m OMNIPAQUE IOHEXOL 300 MG/ML  SOLN COMPARISON:  CT abdomen dated 07/11/2022 FINDINGS: Lower chest: No acute abnormality. Hepatobiliary: No focal liver abnormality is seen. Gallbladder is unremarkable. No bile duct dilatation. Pancreas: Unremarkable. No pancreatic ductal dilatation or surrounding inflammatory changes. Spleen: Normal in size without focal abnormality. Adrenals/Urinary Tract: Adrenal glands appear normal. Kidneys appear normal without mass, stone or hydronephrosis. No perinephric fluid. Bladder is unremarkable. Stomach/Bowel: Markedly distended fluid-filled small bowel loops throughout the abdomen and pelvis, with associated air-fluid levels, consistent with a complete small-bowel obstruction. As there are no decompressed small bowel loops appreciated within the abdomen or pelvis, the obstruction is  likely located just deep to, or just proximal to, the RIGHT abdominal ostomy. Stomach and most proximal portion of the small bowel is relatively normal in caliber. The colon, beyond the ostomy site, is decompressed. Vascular/Lymphatic: No abdominal aortic aneurysm. No acute-appearing vascular abnormality. No enlarged lymph nodes are seen.  Reproductive: Uterus and bilateral adnexa are unremarkable. Other: Small amount of free fluid in the pelvis. No abscess collection is seen. No free intraperitoneal air. Musculoskeletal: No osseous abnormality. IMPRESSION: 1. Markedly distended fluid-filled small bowel loops throughout the abdomen and pelvis, with associated air-fluid levels, consistent with a complete small bowel obstruction. The small-bowel obstruction is likely located just deep to, or just proximal to, the RIGHT abdominal ostomy site. No obstructing mass is identified (adhesions?). 2. Small amount of free fluid in the pelvis. No abscess collection is seen. No free intraperitoneal air. These results were discussed by telephone at the time of interpretation on 01/01/2023 at 1:12 pm with provider Margaretmary Eddy , who verbally acknowledged these results. Electronically Signed   By: Franki Cabot M.D.   On: 01/01/2023 13:15    Scheduled Meds:  lidocaine  15 mL Mouth/Throat Once   pantoprazole (PROTONIX) IV  40 mg Intravenous Daily   prochlorperazine  5 mg Intravenous Once   Continuous Infusions:  sodium chloride 10 mL/hr at 01/02/23 2257     LOS: 2 days   Shelly Coss, MD [facility] P3/04/2023, 10:23 AM

## 2023-01-03 NOTE — Progress Notes (Signed)
Patient ID: Sandra Foster, female   DOB: [birth year], 32 y.o.   MRN: [id number] [facility] Progress Note     Subjective: CC-  Feels much better s/p red rubber catheter insertion into stoma. Reports gas and stool in her bag. Denies nausea.   Objective: Vital signs in last 24 hours: Temp:  [97.9 F (36.6 C)-98.3 F (36.8 C)] 98.1 F (36.7 C) (03/07 0433) Pulse Rate:  [63-67] 63 (03/07 0433) Resp:  [16] 16 (03/07 0433) BP: (100-120)/(54-72) 100/54 (03/07 0433) SpO2:  [98 %-100 %] 100 % (03/07 0433) Last BM Date : 01/02/23  Intake/Output from previous day: 03/06 0701 - 03/07 0700 In: 179.5 [I.V.:179.5] Out: 1625 [Emesis/NG output:1425; Stool:200] Intake/Output this shift: No intake/output data recorded.  PE: Gen:  Alert, NAD, pleasant Abd: soft, mild distension, nontender, ileostomy with gas and very small amt liquid stool. NG tube removed by me   Lab Results:  Recent Labs    01/01/23 1116 01/02/23 0600  WBC 15.8* 11.8*  HGB 15.7* 14.2  HCT 45.3 41.9  PLT 279 261   BMET Recent Labs    01/02/23 0600 01/03/23 0540  NA 138 137  K 4.0 3.8  CL 105 103  CO2 26 27  GLUCOSE 122* 95  BUN 17 18  CREATININE 0.80 0.81  CALCIUM 9.0 8.3*   PT/INR No results for input(s): "LABPROT", "INR" in the last 72 hours. CMP     Component Value Date/Time   NA 137 01/03/2023 0540   K 3.8 01/03/2023 0540   CL 103 01/03/2023 0540   CO2 27 01/03/2023 0540   GLUCOSE 95 01/03/2023 0540   BUN 18 01/03/2023 0540   CREATININE 0.81 01/03/2023 0540   CALCIUM 8.3 (L) 01/03/2023 0540   PROT 8.6 (H) 01/01/2023 1116   ALBUMIN 5.0 01/01/2023 1116   AST 30 01/01/2023 1116   ALT 64 (H) 01/01/2023 1116   ALKPHOS 50 01/01/2023 1116   BILITOT 0.8 01/01/2023 1116   GFRNONAA >60 01/03/2023 0540   GFRAA >60 08/28/2015 2150   Lipase     Component Value Date/Time   LIPASE 31 01/01/2023 1116       Studies/Results: DG Abd Portable 1V-Small Bowel Obstruction  Protocol-initial, 8 hr delay  Result Date: 01/02/2023 CLINICAL DATA:  32 year old female with possible small bowel obstruction. 8 hour postcontrast film. EXAM: PORTABLE ABDOMEN - 1 VIEW COMPARISON:  Abdominal radiograph 01/01/2023. FINDINGS: Nasogastric tube tip in the proximal stomach with side port near the gastroesophageal junction. Paucity of bowel gas, with some mildly dilated loops of small bowel projecting over the right-side of the abdomen measuring up to 4 cm in diameter. No appreciable oral contrast material noted within the bowel. Iodinated contrast material is present within the lumen of the urinary bladder, presumably from recent contrast enhanced CT examination. No substantial colonic gas noted. IMPRESSION: 1. Support apparatus, as above. 2. Findings compatible with persistent small bowel obstruction, as above. Electronically Signed   By: Vinnie Langton M.D.   On: 01/02/2023 06:26   DG Abd Portable 1V-Small Bowel Protocol-Position Verification  Result Date: 01/01/2023 CLINICAL DATA:  Nasogastric tube placement. EXAM: PORTABLE ABDOMEN - 1 VIEW COMPARISON:  April 22, 2022. FINDINGS: The bowel gas pattern is normal. Distal tip of nasogastric tube is seen in expected position of proximal stomach. Residual contrast is seen within dilated intrarenal collecting systems and proximal ureters and urinary bladder. IMPRESSION: Distal tip of nasogastric tube seen in expected position of proximal stomach. Electronically Signed   By: Jeneen Rinks  Murlean Caller M.D.   On: 01/01/2023 16:43   CT ABDOMEN PELVIS W CONTRAST  Result Date: 01/01/2023 CLINICAL DATA:  Crohn's, abdominal pain, nausea and vomiting. EXAM: CT ABDOMEN AND PELVIS WITH CONTRAST TECHNIQUE: Multidetector CT imaging of the abdomen and pelvis was performed using the standard protocol following bolus administration of intravenous contrast. RADIATION DOSE REDUCTION: This exam was performed according to the departmental dose-optimization program which includes  automated exposure control, adjustment of the mA and/or kV according to patient size and/or use of iterative reconstruction technique. CONTRAST:  12m OMNIPAQUE IOHEXOL 300 MG/ML  SOLN COMPARISON:  CT abdomen dated 07/11/2022 FINDINGS: Lower chest: No acute abnormality. Hepatobiliary: No focal liver abnormality is seen. Gallbladder is unremarkable. No bile duct dilatation. Pancreas: Unremarkable. No pancreatic ductal dilatation or surrounding inflammatory changes. Spleen: Normal in size without focal abnormality. Adrenals/Urinary Tract: Adrenal glands appear normal. Kidneys appear normal without mass, stone or hydronephrosis. No perinephric fluid. Bladder is unremarkable. Stomach/Bowel: Markedly distended fluid-filled small bowel loops throughout the abdomen and pelvis, with associated air-fluid levels, consistent with a complete small-bowel obstruction. As there are no decompressed small bowel loops appreciated within the abdomen or pelvis, the obstruction is likely located just deep to, or just proximal to, the RIGHT abdominal ostomy. Stomach and most proximal portion of the small bowel is relatively normal in caliber. The colon, beyond the ostomy site, is decompressed. Vascular/Lymphatic: No abdominal aortic aneurysm. No acute-appearing vascular abnormality. No enlarged lymph nodes are seen. Reproductive: Uterus and bilateral adnexa are unremarkable. Other: Small amount of free fluid in the pelvis. No abscess collection is seen. No free intraperitoneal air. Musculoskeletal: No osseous abnormality. IMPRESSION: 1. Markedly distended fluid-filled small bowel loops throughout the abdomen and pelvis, with associated air-fluid levels, consistent with a complete small bowel obstruction. The small-bowel obstruction is likely located just deep to, or just proximal to, the RIGHT abdominal ostomy site. No obstructing mass is identified (adhesions?). 2. Small amount of free fluid in the pelvis. No abscess collection is  seen. No free intraperitoneal air. These results were discussed by telephone at the time of interpretation on 01/01/2023 at 1:12 pm with provider RMargaretmary Eddy, who verbally acknowledged these results. Electronically Signed   By: SFranki CabotM.D.   On: 01/01/2023 13:15    Anti-infectives: Anti-infectives (From admission, onward)    None        Assessment/Plan -SBO -Hx ex lap right hemicolectomy and end ileostomy 07/13/22 by Dr. WDema Severinfor perforating ileocolic Crohn's disease - CT scan 3/5 shows markedly distended fluid-filled small bowel loops throughout the abdomen and pelvis with associated air-fluid levels, consistent with a complete small bowel obstruction; the small-bowel obstruction is likely located just deep to, or just proximal to, the RIGHT abdominal ostomy site and possibly due to adhesions; no obstructing mass is identified; small amount of free fluid in the pelvis; no abscess or free intraperitoneal air - Able to digitize stoma but it does feel very tight at fascial level. Able to advance red rubber catheter per stoma with return of thin stool. Continue red rubber per stoma. Hopefully edema will gradually decrease and she will continue to have bowel function.  - NG out. Start FLD. Will need FLD vs low residue diet until scheduled surgery with Dr. WDema Severin It is possible she will be stable for discharge later today, I will check on her.   ID - none  VTE - SCDs, ok for chemical dvt ppx from surgical standpoint FEN - IVF, FLD Foley - none Follow  up - Scheduled for ileostomy takedown 01/09/23 with Dr. Dema Severin  I reviewed Consultant gastroenterology notes, last 24 h vitals and pain scores, last 48 h intake and output, last 24 h labs and trends, and last 24 h imaging results.    LOS: 2 days    [facility] 01/03/2023, 10:36 AM Please see Amion for pager number during day hours 7:00am-4:30pm

## 2023-01-03 NOTE — Discharge Instructions (Addendum)
Continue a full liquid diet (high in protein - ensure/protein shakes, yogurt, etc) until surgery. You may have a small volume of low fiber/low residue food but you need to chew your food very well to avoid another blockage leading up to surgery.   Keep the red rubber in place if possible. If it fully comes out then apply lubricant to the end of a catheter and re-insert it.

## 2023-01-05 ENCOUNTER — Emergency Department (HOSPITAL_COMMUNITY): Payer: Medicaid Other

## 2023-01-05 ENCOUNTER — Inpatient Hospital Stay (HOSPITAL_COMMUNITY)
Admission: EM | Admit: 2023-01-05 | Discharge: 2023-01-15 | DRG: 330 | Disposition: A | Discharge status: Home / Self Care | Payer: Medicaid Other | Attending: Surgery | Admitting: Surgery

## 2023-01-05 ENCOUNTER — Other Ambulatory Visit: Payer: Self-pay

## 2023-01-05 ENCOUNTER — Encounter (HOSPITAL_COMMUNITY): Payer: Self-pay

## 2023-01-05 DIAGNOSIS — Z9889 Other specified postprocedural states: Secondary | ICD-10-CM | POA: Diagnosis present

## 2023-01-05 DIAGNOSIS — E119 Type 2 diabetes mellitus without complications: Secondary | ICD-10-CM | POA: Diagnosis present

## 2023-01-05 DIAGNOSIS — D72829 Elevated white blood cell count, unspecified: Secondary | ICD-10-CM | POA: Diagnosis present

## 2023-01-05 DIAGNOSIS — Z79899 Other long term (current) drug therapy: Secondary | ICD-10-CM

## 2023-01-05 DIAGNOSIS — K5651 Intestinal adhesions [bands], with partial obstruction: Principal | ICD-10-CM | POA: Diagnosis present

## 2023-01-05 DIAGNOSIS — R519 Headache, unspecified: Secondary | ICD-10-CM | POA: Diagnosis not present

## 2023-01-05 DIAGNOSIS — K5090 Crohn's disease, unspecified, without complications: Secondary | ICD-10-CM | POA: Diagnosis present

## 2023-01-05 DIAGNOSIS — R112 Nausea with vomiting, unspecified: Secondary | ICD-10-CM | POA: Diagnosis present

## 2023-01-05 DIAGNOSIS — Z8249 Family history of ischemic heart disease and other diseases of the circulatory system: Secondary | ICD-10-CM

## 2023-01-05 DIAGNOSIS — R109 Unspecified abdominal pain: Secondary | ICD-10-CM | POA: Diagnosis present

## 2023-01-05 DIAGNOSIS — E860 Dehydration: Secondary | ICD-10-CM | POA: Diagnosis present

## 2023-01-05 DIAGNOSIS — I2510 Atherosclerotic heart disease of native coronary artery without angina pectoris: Secondary | ICD-10-CM | POA: Diagnosis present

## 2023-01-05 DIAGNOSIS — E876 Hypokalemia: Secondary | ICD-10-CM | POA: Diagnosis present

## 2023-01-05 DIAGNOSIS — Z793 Long term (current) use of hormonal contraceptives: Secondary | ICD-10-CM

## 2023-01-05 DIAGNOSIS — Z87891 Personal history of nicotine dependence: Secondary | ICD-10-CM

## 2023-01-05 DIAGNOSIS — K56609 Unspecified intestinal obstruction, unspecified as to partial versus complete obstruction: Secondary | ICD-10-CM

## 2023-01-05 DIAGNOSIS — Z01818 Encounter for other preprocedural examination: Secondary | ICD-10-CM

## 2023-01-05 DIAGNOSIS — Z432 Encounter for attention to ileostomy: Secondary | ICD-10-CM

## 2023-01-05 DIAGNOSIS — Z9049 Acquired absence of other specified parts of digestive tract: Secondary | ICD-10-CM

## 2023-01-05 LAB — I-STAT BETA HCG BLOOD, ED (MC, WL, AP ONLY): I-stat hCG, quantitative: <5 m[IU]/mL (ref <5)

## 2023-01-05 LAB — CBC WITH DIFFERENTIAL/PLATELET
Abs Immature Granulocytes: 0.04 10*3/uL (ref 0.00–0.07)
Basophils Absolute: 0 10*3/uL (ref 0.0–0.1)
Basophils Relative: 0 %
Eosinophils Absolute: 0.1 10*3/uL (ref 0.0–0.5)
Eosinophils Relative: 1 %
HCT: 40.7 % (ref 36.0–46.0)
Hemoglobin: 13.6 g/dL (ref 12.0–15.0)
Immature Granulocytes: 0 %
Lymphocytes Relative: 17 %
Lymphs Abs: 1.9 10*3/uL (ref 0.7–4.0)
MCH: 29.1 pg (ref 26.0–34.0)
MCHC: 33.4 g/dL (ref 30.0–36.0)
MCV: 87.2 fL (ref 80.0–100.0)
Monocytes Absolute: 1 10*3/uL (ref 0.1–1.0)
Monocytes Relative: 9 %
Neutro Abs: 7.8 10*3/uL — ABNORMAL HIGH (ref 1.7–7.7)
Neutrophils Relative %: 73 %
Platelets: 253 10*3/uL (ref 150–400)
RBC: 4.67 MIL/uL (ref 3.87–5.11)
RDW: 14 % (ref 11.5–15.5)
WBC: 10.8 10*3/uL — ABNORMAL HIGH (ref 4.0–10.5)
nRBC: 0 % (ref 0.0–0.2)

## 2023-01-05 LAB — COMPREHENSIVE METABOLIC PANEL
ALT: 39 U/L (ref 0–44)
AST: 23 U/L (ref 15–41)
Albumin: 4.4 g/dL (ref 3.5–5.0)
Alkaline Phosphatase: 51 U/L (ref 38–126)
Anion gap: 10 (ref 5–15)
BUN: 7 mg/dL (ref 6–20)
CO2: 29 mmol/L (ref 22–32)
Calcium: 9.7 mg/dL (ref 8.9–10.3)
Chloride: 99 mmol/L (ref 98–111)
Creatinine, Ser: 0.82 mg/dL (ref 0.44–1.00)
GFR, Estimated: >60 mL/min (ref >60)
Glucose, Bld: 114 mg/dL — ABNORMAL HIGH (ref 70–99)
Potassium: 3.4 mmol/L — ABNORMAL LOW (ref 3.5–5.1)
Sodium: 138 mmol/L (ref 135–145)
Total Bilirubin: 0.7 mg/dL (ref 0.3–1.2)
Total Protein: 7.5 g/dL (ref 6.5–8.1)

## 2023-01-05 LAB — LIPASE, BLOOD: Lipase: 43 U/L (ref 11–51)

## 2023-01-05 MED ORDER — ONDANSETRON HCL 4 MG/2ML IJ SOLN
4.0000 mg | Freq: Once | INTRAMUSCULAR | Status: AC
  Administered 2023-01-05: 4 mg via INTRAVENOUS
  Filled 2023-01-05: qty 2

## 2023-01-05 MED ORDER — HYDROMORPHONE HCL 1 MG/ML IJ SOLN
1.0000 mg | Freq: Once | INTRAMUSCULAR | Status: AC
  Administered 2023-01-05: 1 mg via INTRAVENOUS
  Filled 2023-01-05: qty 1

## 2023-01-05 MED ORDER — SODIUM CHLORIDE 0.9 % IV BOLUS
1000.0000 mL | Freq: Once | INTRAVENOUS | Status: AC
  Administered 2023-01-05: 1000 mL via INTRAVENOUS

## 2023-01-05 MED ORDER — IOHEXOL 300 MG/ML  SOLN
100.0000 mL | Freq: Once | INTRAMUSCULAR | Status: AC | PRN
  Administered 2023-01-05: 100 mL via INTRAVENOUS

## 2023-01-05 NOTE — ED Provider Notes (Signed)
[facility] Provider Note   CSN: [id number] Arrival date & time: 01/05/23  2114     History {Add pertinent medical, surgical, social history, OB history to HPI:1} Chief Complaint  Patient presents with   Abdominal Pain    Sandra Foster is a 32 y.o. female.  The history is provided by the patient and medical records.  Abdominal Pain Associated symptoms: nausea and vomiting    32 year old female with history of Crohn's disease, anemia, diabetes, presenting to the ED with abdominal pain.  Patient was hospitalized 3/5 - 3/7 for SBO.  States when she left the hospital she was tolerating clear liquids only but felt ok.  She reports yesterday evening she started to have return of abdominal discomfort, bloating, nausea, and vomiting.  She still had output from her ileostomy but much less than usual (about 1/2 bag full today only).  She denies fever/chills.  She is scheduled for ileostomy takedown on 01/09/23.    Home Medications Prior to Admission medications   Medication Sig Start Date End Date Taking? Authorizing Provider  acetaminophen (TYLENOL) 500 MG tablet Take 1 tablet (500 mg total) by mouth every 6 (six) hours as needed. Patient taking differently: Take 1,500 mg by mouth as needed for mild pain or moderate pain. 05/22/22   Varney Biles, MD  etonogestrel (NEXPLANON) 68 MG IMPL implant 1 each by Subdermal route once.    [provider]  metroNIDAZOLE (FLAGYL) 500 MG tablet Take by mouth. Patient not taking: Reported on 01/01/2023 12/28/22   [provider]  neomycin (MYCIFRADIN) 500 MG tablet Take 1,000 mg by mouth 3 (three) times daily. Patient not taking: Reported on 01/01/2023 12/28/22   [provider]  amitriptyline (ELAVIL) 25 MG tablet Take 1-2 tablets (25-50 mg total) by mouth at bedtime as needed (headache). 03/25/20 06/18/20  Gregor Hams, MD      Allergies    Nsaids    Review of Systems   Review of  Systems  Gastrointestinal:  Positive for abdominal pain, nausea and vomiting.  All other systems reviewed and are negative.   Physical Exam Updated Vital Signs BP 119/64   Pulse 72   Temp 98.9 F (37.2 C) (Oral)   Resp 18   Ht '5\' 6"'$  (1.676 m)   Wt 68.5 kg   SpO2 100%   BMI 24.37 kg/m   Physical Exam Vitals and nursing note reviewed.  Constitutional:      Appearance: She is well-developed.  HENT:     Head: Normocephalic and atraumatic.  Eyes:     Conjunctiva/sclera: Conjunctivae normal.     Pupils: Pupils are equal, round, and reactive to light.  Cardiovascular:     Rate and Rhythm: Normal rate and regular rhythm.     Heart sounds: Normal heart sounds.  Pulmonary:     Effort: Pulmonary effort is normal.     Breath sounds: Normal breath sounds.  Abdominal:     General: Bowel sounds are normal.     Palpations: Abdomen is soft.     Tenderness: There is generalized abdominal tenderness.     Comments: Ileostomy present, small amount of yellow stool output noted in bag, abdomen appears distended, generalized tenderness  Musculoskeletal:        General: Normal range of motion.     Cervical back: Normal range of motion.  Skin:    General: Skin is warm and dry.  Neurological:     Mental Status: She is  alert and oriented to person, place, and time.     ED Results / Procedures / Treatments   Labs (all labs ordered are listed, but only abnormal results are displayed) Labs Reviewed  CBC WITH DIFFERENTIAL/PLATELET - Abnormal; Notable for the following components:      Result Value   WBC 10.8 (*)    Neutro Abs 7.8 (*)    All other components within normal limits  COMPREHENSIVE METABOLIC PANEL  LIPASE, BLOOD  URINALYSIS, ROUTINE W REFLEX MICROSCOPIC  I-STAT BETA HCG BLOOD, ED ([facility], [facility], [facility] ONLY)    EKG None  Radiology No results found.  Procedures Procedures  {Document cardiac monitor, telemetry assessment procedure when appropriate:1}  Medications Ordered in  ED Medications  HYDROmorphone (DILAUDID) injection 1 mg (has no administration in time range)  ondansetron (ZOFRAN) injection 4 mg (has no administration in time range)  sodium chloride 0.9 % bolus 1,000 mL (has no administration in time range)    ED Course/ Medical Decision Making/ A&P   {   Click here for ABCD2, HEART and other calculatorsREFRESH Note before signing :1}                          Medical Decision Making Amount and/or Complexity of Data Reviewed Labs: ordered. Radiology: ordered.  Risk Prescription drug management.   ***  {Document critical care time when appropriate:1} {Document review of labs and clinical decision tools ie heart score, Chads2Vasc2 etc:1}  {Document your independent review of radiology images, and any outside records:1} {Document your discussion with family members, caretakers, and with consultants:1} {Document social determinants of health affecting pt's care:1} {Document your decision making why or why not admission, treatments were needed:1} Final Clinical Impression(s) / ED Diagnoses Final diagnoses:  None    Rx / DC Orders ED Discharge Orders     None

## 2023-01-05 NOTE — ED Triage Notes (Signed)
Arrived via EMS from home for Abdominal pain with distention, nausea and vomiting. BP 110/78 Pulse 82 98% CBG 124

## 2023-01-06 ENCOUNTER — Encounter (HOSPITAL_COMMUNITY): Payer: Self-pay | Admitting: Internal Medicine

## 2023-01-06 ENCOUNTER — Emergency Department (HOSPITAL_COMMUNITY): Payer: Medicaid Other

## 2023-01-06 DIAGNOSIS — R1084 Generalized abdominal pain: Secondary | ICD-10-CM | POA: Diagnosis not present

## 2023-01-06 DIAGNOSIS — E876 Hypokalemia: Secondary | ICD-10-CM | POA: Diagnosis present

## 2023-01-06 DIAGNOSIS — R519 Headache, unspecified: Secondary | ICD-10-CM | POA: Diagnosis not present

## 2023-01-06 DIAGNOSIS — K5651 Intestinal adhesions [bands], with partial obstruction: Secondary | ICD-10-CM | POA: Diagnosis present

## 2023-01-06 DIAGNOSIS — K56609 Unspecified intestinal obstruction, unspecified as to partial versus complete obstruction: Secondary | ICD-10-CM | POA: Diagnosis present

## 2023-01-06 DIAGNOSIS — E860 Dehydration: Secondary | ICD-10-CM | POA: Diagnosis present

## 2023-01-06 DIAGNOSIS — I10 Essential (primary) hypertension: Secondary | ICD-10-CM | POA: Diagnosis not present

## 2023-01-06 DIAGNOSIS — Z8249 Family history of ischemic heart disease and other diseases of the circulatory system: Secondary | ICD-10-CM | POA: Diagnosis not present

## 2023-01-06 DIAGNOSIS — Z432 Encounter for attention to ileostomy: Secondary | ICD-10-CM | POA: Diagnosis not present

## 2023-01-06 DIAGNOSIS — R109 Unspecified abdominal pain: Secondary | ICD-10-CM | POA: Diagnosis present

## 2023-01-06 DIAGNOSIS — Z79899 Other long term (current) drug therapy: Secondary | ICD-10-CM | POA: Diagnosis not present

## 2023-01-06 DIAGNOSIS — Z9049 Acquired absence of other specified parts of digestive tract: Secondary | ICD-10-CM | POA: Diagnosis not present

## 2023-01-06 DIAGNOSIS — Z793 Long term (current) use of hormonal contraceptives: Secondary | ICD-10-CM | POA: Diagnosis not present

## 2023-01-06 DIAGNOSIS — D72829 Elevated white blood cell count, unspecified: Secondary | ICD-10-CM | POA: Diagnosis present

## 2023-01-06 DIAGNOSIS — E119 Type 2 diabetes mellitus without complications: Secondary | ICD-10-CM

## 2023-01-06 DIAGNOSIS — R112 Nausea with vomiting, unspecified: Secondary | ICD-10-CM | POA: Diagnosis present

## 2023-01-06 DIAGNOSIS — I2510 Atherosclerotic heart disease of native coronary artery without angina pectoris: Secondary | ICD-10-CM | POA: Diagnosis present

## 2023-01-06 DIAGNOSIS — K5090 Crohn's disease, unspecified, without complications: Secondary | ICD-10-CM | POA: Diagnosis present

## 2023-01-06 DIAGNOSIS — Z87891 Personal history of nicotine dependence: Secondary | ICD-10-CM | POA: Diagnosis not present

## 2023-01-06 LAB — CBC WITH DIFFERENTIAL/PLATELET
Abs Immature Granulocytes: 0.01 10*3/uL (ref 0.00–0.07)
Basophils Absolute: 0 10*3/uL (ref 0.0–0.1)
Basophils Relative: 1 %
Eosinophils Absolute: 0.1 10*3/uL (ref 0.0–0.5)
Eosinophils Relative: 1 %
HCT: 35.7 % — ABNORMAL LOW (ref 36.0–46.0)
Hemoglobin: 12 g/dL (ref 12.0–15.0)
Immature Granulocytes: 0 %
Lymphocytes Relative: 31 %
Lymphs Abs: 2.4 10*3/uL (ref 0.7–4.0)
MCH: 29.5 pg (ref 26.0–34.0)
MCHC: 33.6 g/dL (ref 30.0–36.0)
MCV: 87.7 fL (ref 80.0–100.0)
Monocytes Absolute: 1 10*3/uL (ref 0.1–1.0)
Monocytes Relative: 13 %
Neutro Abs: 4.2 10*3/uL (ref 1.7–7.7)
Neutrophils Relative %: 54 %
Platelets: 227 10*3/uL (ref 150–400)
RBC: 4.07 MIL/uL (ref 3.87–5.11)
RDW: 14.2 % (ref 11.5–15.5)
WBC: 7.7 10*3/uL (ref 4.0–10.5)
nRBC: 0 % (ref 0.0–0.2)

## 2023-01-06 LAB — COMPREHENSIVE METABOLIC PANEL
ALT: 30 U/L (ref 0–44)
AST: 15 U/L (ref 15–41)
Albumin: 3.7 g/dL (ref 3.5–5.0)
Alkaline Phosphatase: 44 U/L (ref 38–126)
Anion gap: 5 (ref 5–15)
BUN: 7 mg/dL (ref 6–20)
CO2: 24 mmol/L (ref 22–32)
Calcium: 8.3 mg/dL — ABNORMAL LOW (ref 8.9–10.3)
Chloride: 103 mmol/L (ref 98–111)
Creatinine, Ser: 0.69 mg/dL (ref 0.44–1.00)
GFR, Estimated: >60 mL/min (ref >60)
Glucose, Bld: 101 mg/dL — ABNORMAL HIGH (ref 70–99)
Potassium: 3.6 mmol/L (ref 3.5–5.1)
Sodium: 132 mmol/L — ABNORMAL LOW (ref 135–145)
Total Bilirubin: 0.6 mg/dL (ref 0.3–1.2)
Total Protein: 6.2 g/dL — ABNORMAL LOW (ref 6.5–8.1)

## 2023-01-06 LAB — GLUCOSE, CAPILLARY
Glucose-Capillary: 82 mg/dL (ref 70–99)
Glucose-Capillary: 83 mg/dL (ref 70–99)
Glucose-Capillary: 84 mg/dL (ref 70–99)
Glucose-Capillary: 84 mg/dL (ref 70–99)

## 2023-01-06 LAB — MAGNESIUM: Magnesium: 2.1 mg/dL (ref 1.7–2.4)

## 2023-01-06 LAB — PROTIME-INR
INR: 1.3 — ABNORMAL HIGH (ref 0.8–1.2)
Prothrombin Time: 15.6 seconds — ABNORMAL HIGH (ref 11.4–15.2)

## 2023-01-06 MED ORDER — POTASSIUM CHLORIDE 10 MEQ/100ML IV SOLN
10.0000 meq | INTRAVENOUS | Status: AC
  Administered 2023-01-06 (×4): 10 meq via INTRAVENOUS
  Filled 2023-01-06 (×4): qty 100

## 2023-01-06 MED ORDER — INSULIN ASPART 100 UNIT/ML IJ SOLN
0.0000 [IU] | Freq: Four times a day (QID) | INTRAMUSCULAR | Status: AC
  Filled 2023-01-06: qty 0.06

## 2023-01-06 MED ORDER — NALOXONE HCL 0.4 MG/ML IJ SOLN: 0.4000 mg | INTRAMUSCULAR | Status: DC | PRN

## 2023-01-06 MED ORDER — LACTATED RINGERS IV SOLN: INTRAVENOUS | Status: DC

## 2023-01-06 MED ORDER — HYDROMORPHONE HCL 1 MG/ML IJ SOLN
0.5000 mg | INTRAMUSCULAR | Status: DC | PRN
  Administered 2023-01-06 – 2023-01-13 (×57): 0.5 mg via INTRAVENOUS
  Filled 2023-01-06 (×57): qty 0.5

## 2023-01-06 MED ORDER — ONDANSETRON HCL 4 MG/2ML IJ SOLN
4.0000 mg | Freq: Once | INTRAMUSCULAR | Status: AC
  Administered 2023-01-06: 4 mg via INTRAVENOUS
  Filled 2023-01-06: qty 2

## 2023-01-06 MED ORDER — ACETAMINOPHEN 650 MG RE SUPP: 650.0000 mg | Freq: Four times a day (QID) | RECTAL | Status: DC | PRN

## 2023-01-06 MED ORDER — ACETAMINOPHEN 325 MG PO TABS
650.0000 mg | ORAL_TABLET | Freq: Four times a day (QID) | ORAL | Status: DC | PRN
  Administered 2023-01-09: 650 mg via ORAL
  Filled 2023-01-06: qty 2

## 2023-01-06 MED ORDER — HYDROMORPHONE HCL 1 MG/ML IJ SOLN
1.0000 mg | Freq: Once | INTRAMUSCULAR | Status: AC
  Administered 2023-01-06: 1 mg via INTRAVENOUS
  Filled 2023-01-06: qty 1

## 2023-01-06 MED ORDER — ONDANSETRON HCL 4 MG/2ML IJ SOLN
4.0000 mg | Freq: Four times a day (QID) | INTRAMUSCULAR | Status: DC | PRN
  Administered 2023-01-06 – 2023-01-13 (×4): 4 mg via INTRAVENOUS
  Filled 2023-01-06 (×4): qty 2

## 2023-01-06 MED ORDER — CAMPHOR-MENTHOL 0.5-0.5 % EX LOTN: TOPICAL_LOTION | CUTANEOUS | Status: DC | PRN

## 2023-01-06 NOTE — Progress Notes (Signed)
Subjective/Chief Complaint: Pt with some abd pain this AM Had a good day the day she was Dc'd from the hosp this past week but distention returned thereafter.  Still with ostomy output   Objective: Vital signs in last 24 hours: Temp:  [97.7 F (36.5 C)-98.9 F (37.2 C)] 97.7 F (36.5 C) (03/10 0537) Pulse Rate:  [60-72] 60 (03/10 0537) Resp:  [16-18] 16 (03/10 0537) BP: (104-119)/(61-64) 104/61 (03/10 0537) SpO2:  [98 %-100 %] 100 % (03/10 0537) Weight:  [68.5 kg] 68.5 kg (03/09 2126)    Intake/Output from previous day: 03/09 0701 - 03/10 0700 In: 203 [I.V.:71.1; IV Piggyback:131.9] Out: 150 [Stool:150] Intake/Output this shift: No intake/output data recorded.  PE:  Constitutional: No acute distress, conversant, appears states age. Eyes: Anicteric sclerae, moist conjunctiva, no lid lag Lungs: Clear to auscultation bilaterally, normal respiratory effort CV: regular rate and rhythm, no murmurs, no peripheral edema, pedal pulses 2+ GI: Soft, no masses or hepatosplenomegaly, non-tender to palpation Skin: No rashes, palpation reveals normal turgor Psychiatric: appropriate judgment and insight, oriented to person, place, and time   Lab Results:  Recent Labs    01/05/23 2155 01/06/23 0523  WBC 10.8* 7.7  HGB 13.6 12.0  HCT 40.7 35.7*  PLT 253 227   BMET Recent Labs    01/05/23 2155 01/06/23 0523  NA 138 132*  K 3.4* 3.6  CL 99 103  CO2 29 24  GLUCOSE 114* 101*  BUN 7 7  CREATININE 0.82 0.69  CALCIUM 9.7 8.3*   PT/INR Recent Labs    01/06/23 0523  LABPROT 15.6*  INR 1.3*   ABG No results for input(s): "PHART", "HCO3" in the last 72 hours.  Invalid input(s): "PCO2", "PO2"  Studies/Results: DG Chest Portable 1 View  Result Date: 01/06/2023 CLINICAL DATA:  NG tube placement EXAM: PORTABLE CHEST 1 VIEW COMPARISON:  09/16/2022 chest radiograph, 01/02/2023 abdomen radiograph FINDINGS: NG tube tip overlies the stomach, with side port just below the  diaphragm on the second image. Cardiac and mediastinal contours are within normal limits. No focal pulmonary opacity. No pleural effusion or pneumothorax. No acute osseous abnormality. IMPRESSION: NG tube tip overlies the stomach, with side port just below the diaphragm on the second image. Electronically Signed   By: Merilyn Baba M.D.   On: 01/06/2023 01:35   CT ABDOMEN PELVIS W CONTRAST  Result Date: 01/06/2023 CLINICAL DATA:  Bowel obstruction suspected crohn's, recent SBO, bloating and vomiting EXAM: CT ABDOMEN AND PELVIS WITH CONTRAST TECHNIQUE: Multidetector CT imaging of the abdomen and pelvis was performed using the standard protocol following bolus administration of intravenous contrast. RADIATION DOSE REDUCTION: This exam was performed according to the departmental dose-optimization program which includes automated exposure control, adjustment of the mA and/or kV according to patient size and/or use of iterative reconstruction technique. CONTRAST:  116m OMNIPAQUE IOHEXOL 300 MG/ML  SOLN COMPARISON:  CT abdomen pelvis 01/08/2022 FINDINGS: Lower chest: No acute abnormality. Hepatobiliary: Persistent indeterminate vague slightly heterogeneous hypodensity along the right inferior hepatic lobe (5:45). No gallstones, gallbladder wall thickening, or pericholecystic fluid. No biliary dilatation. Pancreas: No focal lesion. Normal pancreatic contour. No surrounding inflammatory changes. No main pancreatic ductal dilatation. Spleen: Normal in size without focal abnormality. Adrenals/Urinary Tract: No adrenal nodule bilaterally. Bilateral kidneys enhance symmetrically. No hydronephrosis. No hydroureter. The urinary bladder is unremarkable. Stomach/Bowel: End ileostomy formation at the right lower quadrant. Catheter threaded through the end ileostomy noted. Stomach is within normal limits. Under distended stomach. The majority of the jejunum,  ileum, and terminal ileum are dilated with fluid with a transition  point at the end ileostomy formation. The small bowel measures up to 4.6 cm. Short segment mid small bowel wall thickening and decreased caliber likely due to peristalsis and non dilatation. No associated fat stranding or combing of the vasculature. No evidence of large bowel wall thickening or dilatation. Fully collapsed large bowel. Status post appendectomy. Vague swirling of the mesentery within the right mid abdomen in no definite association with a transition point. Several prominent lymph nodes are noted in this area (2:49). Vascular/Lymphatic: No abdominal aorta or iliac aneurysm. No abdominal, pelvic, or inguinal lymphadenopathy. Reproductive: Uterus and bilateral adnexa are unremarkable. Other: No intraperitoneal free fluid. No intraperitoneal free gas. No organized fluid collection. Musculoskeletal: No abdominal wall hernia or abnormality. No suspicious lytic or blastic osseous lesions. No acute displaced fracture. IMPRESSION: 1. High-grade small-bowel obstruction with transition point at the end ileostomy formation. Catheter threaded through the ileostomy in appropriate position. 2. Persistent indeterminate vague slightly heterogeneous hypodensity along the right inferior hepatic lobe. Finding likely of benign etiology. Electronically Signed   By: Iven Finn M.D.   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 Dema Severin for perforating ileocolic Crohn's disease - Pt with return of SBO -cont' with NG  and NPO for now -Scheduled for ileostomy takedown 01/09/23 with Dr. Dema Severin ID - none  VTE - SCDs, ok for chemical dvt ppx from surgical standpoint FEN - IVF, NPO Foley - none  Straightforward medical decision making   LOS: 0 days    Ralene Ok 01/06/2023

## 2023-01-06 NOTE — Progress Notes (Addendum)
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  She was just discharged on 3/7 when she was admitted for small bowel obstruction, general surgery cleared for discharge at this time.  After she went home, she developed new generalized abdominal discomfort with vomiting.  On presentation, she was hemodynamically stable.  CT abdomen/pelvis showed high-grade small bowel obstruction with transition point at the end ileostomy formation.  General surgery consulted.  NG tube placed.  Assessment & Plan:  Principal Problem:   Small bowel obstruction (HCC) Active Problems:   DM2 (diabetes mellitus, type 2) (HCC)   Hypokalemia   Abdominal pain   Nausea & vomiting   Leukocytosis  SBO: Presented again with abdomen pain, nausea and vomiting.  She was discharged from here on 3/7 after she was admitted for SBO.   CT abdomen/pelvis showed high-grade small bowel obstruction with transition point at the end ileostomy formation.  General surgery consulted.  NG tube placed. Abdominal pain, nausea and vomiting have resolved this morning.  She again started having bowel movement, presence of stool in the ileostomy bag. Plan for ileostomy takedown on 01/09/2023 with Dr. Dema Severin.  Crohn's disease: History of Crohn's disease with right hemicolectomy with end ileostomy because of perforated ileocolic Crohn's with mesenteric abscess.  Currently on monthly infusion of Remicade.  GI consulted here on last admission, her condition was thought not to be secondary to Crohn's disease            DVT prophylaxis:SCDs Start: 01/06/23 0125     Code Status: Full Code  Family  Communication: None at bed side  Patient status:Inpatient  Patient is from :Home  Anticipated discharge [id number]  Estimated DC date:after general surgery clearance   Consultants: General surgery  Procedures: NG tube placement  Antimicrobials:  Anti-infectives (From admission, onward)    None       Subjective: Patient seen and examined at bedside today.  Hemodynamically stable.  She was very comfortable when I evaluated her.  She again started having bowel movement, stool seen in the ileostomy bag.  No nausea or vomiting.  Objective: Vitals:   01/05/23 2135 01/06/23 0136 01/06/23 0150 01/06/23 0537  BP: 119/64  117/61 104/61  Pulse: 72  62 60  Resp: '18  18 16  '$ Temp:  98.9 F (37.2 C) 98 F (36.7 C) 97.7 F (36.5 C)  TempSrc:  Oral Oral Oral  SpO2: 100%  100% 100%  Weight:      Height:        Intake/Output Summary (Last 24 hours) at 01/06/2023 0820 Last data filed at 01/06/2023 0537 Gross per 24 hour  Intake 202.97 ml  Output 150 ml  Net 52.97 ml   Filed Weights   01/05/23 2126  Weight: 68.5 kg    Examination:  General exam: Overall comfortable, not in distress HEENT: PERRL,NG tube Respiratory system:  no wheezes or crackles  Cardiovascular system: S1 & S2 heard, RRR.  Gastrointestinal system: Abdomen is nondistended, soft and nontender.  Ileostomy with stool Central nervous system: Alert and oriented Extremities: No edema, no clubbing ,no cyanosis Skin: No rashes, no ulcers,no icterus     Data Reviewed: I have personally reviewed following labs and imaging studies  CBC: Recent Labs  Lab 01/01/23 1116 01/02/23 0600 01/05/23 2155 01/06/23 0523  WBC 15.8* 11.8* 10.8* 7.7  NEUTROABS 14.0*  --  7.8* 4.2  HGB 15.7* 14.2 13.6 12.0  HCT 45.3 41.9 40.7 35.7*  MCV 85.2 87.1 87.2 87.7  PLT 279 261 253 [id number]   Basic Metabolic Panel: Recent Labs  Lab 01/01/23 1116 01/02/23 0600 01/03/23 0540 01/05/23 2155 01/06/23 0523  NA 134* 138 137 138 132*   K 3.2* 4.0 3.8 3.4* 3.6  CL 102 105 103 99 103  CO2 '23 26 27 29 24  '$ GLUCOSE 165* 122* 95 114* 101*  BUN '17 17 18 7 7  '$ CREATININE 0.81 0.80 0.81 0.82 0.69  CALCIUM 9.7 9.0 8.3* 9.7 8.3*  MG  --   --   --   --  2.1     No results found for this or any previous visit (from the past 240 hour(s)).   Radiology Studies: DG Chest Portable 1 View  Result Date: 01/06/2023 CLINICAL DATA:  NG tube placement EXAM: PORTABLE CHEST 1 VIEW COMPARISON:  09/16/2022 chest radiograph, 01/02/2023 abdomen radiograph FINDINGS: NG tube tip overlies the stomach, with side port just below the diaphragm on the second image. Cardiac and mediastinal contours are within normal limits. No focal pulmonary opacity. No pleural effusion or pneumothorax. No acute osseous abnormality. IMPRESSION: NG tube tip overlies the stomach, with side port just below the diaphragm on the second image. Electronically Signed   By: Merilyn Baba M.D.   On: 01/06/2023 01:35   CT ABDOMEN PELVIS W CONTRAST  Result Date: 01/06/2023 CLINICAL DATA:  Bowel obstruction suspected crohn's, recent SBO, bloating and vomiting EXAM: CT ABDOMEN AND PELVIS WITH CONTRAST TECHNIQUE: Multidetector CT imaging of the abdomen and pelvis was performed using the standard protocol following bolus administration of intravenous contrast. RADIATION DOSE REDUCTION: This exam was performed according to the departmental dose-optimization program which includes automated exposure control, adjustment of the mA and/or kV according to patient size and/or use of iterative reconstruction technique. CONTRAST:  112m OMNIPAQUE IOHEXOL 300 MG/ML  SOLN COMPARISON:  CT abdomen pelvis 01/08/2022 FINDINGS: Lower chest: No acute abnormality. Hepatobiliary: Persistent indeterminate vague slightly heterogeneous hypodensity along the right inferior hepatic lobe (5:45). No gallstones, gallbladder wall thickening, or pericholecystic fluid. No biliary dilatation. Pancreas: No focal lesion. Normal  pancreatic contour. No surrounding inflammatory changes. No main pancreatic ductal dilatation. Spleen: Normal in size without focal abnormality. Adrenals/Urinary Tract: No adrenal nodule bilaterally. Bilateral kidneys enhance symmetrically. No hydronephrosis. No hydroureter. The urinary bladder is unremarkable. Stomach/Bowel: End ileostomy formation at the right lower quadrant. Catheter threaded through the end ileostomy noted. Stomach is within normal limits. Under distended stomach. The majority of the jejunum, ileum, and terminal ileum are dilated with fluid with a transition point at the end ileostomy formation. The small bowel measures up to 4.6 cm. Short segment mid small bowel wall thickening and decreased caliber likely due to peristalsis and non dilatation. No associated fat stranding or combing of the vasculature. No evidence of large bowel wall thickening or dilatation. Fully collapsed large bowel. Status post appendectomy. Vague swirling of the mesentery within the right mid abdomen in no definite association with a transition point. Several prominent lymph nodes are noted in this area (2:49). Vascular/Lymphatic: No abdominal aorta or iliac aneurysm. No abdominal, pelvic, or inguinal lymphadenopathy. Reproductive: Uterus and bilateral adnexa are unremarkable. Other: No intraperitoneal free fluid. No intraperitoneal free gas. No organized fluid collection. Musculoskeletal: No abdominal wall hernia or  abnormality. No suspicious lytic or blastic osseous lesions. No acute displaced fracture. IMPRESSION: 1. High-grade small-bowel obstruction with transition point at the end ileostomy formation. Catheter threaded through the ileostomy in appropriate position. 2. Persistent indeterminate vague slightly heterogeneous hypodensity along the right inferior hepatic lobe. Finding likely of benign etiology. Electronically Signed   By: Iven Finn M.D.   On: 01/06/2023 00:04    Scheduled Meds:  insulin aspart   0-6 Units Subcutaneous Q6H   Continuous Infusions:  lactated ringers 100 mL/hr at 01/06/23 0157   potassium chloride 10 mEq (01/06/23 0721)     LOS: 0 days   Shelly Coss, MD [facility] P3/07/2023, 8:20 AM

## 2023-01-06 NOTE — Progress Notes (Cosign Needed)
  Transition of Care (TOC) Screening Note   Patient Details  Name: Sandra Foster Date of Birth: [birth year]   Transition of Care [facility]) CM/SW Contact:    Henrietta Dine, RN Phone Number: 01/06/2023, 5:55 PM    Transition of Care Department [facility]) has reviewed patient and no TOC needs have been identified at this time. We will continue to monitor patient advancement through interdisciplinary progression rounds. If new patient transition needs arise, please place a TOC consult.

## 2023-01-06 NOTE — Progress Notes (Signed)
Pt is up to unit via stretcher with nurse- Dan Europe, RN. This nurse informed Dan Europe that no report has been given to this nurse.  Dan Europe stated that she had given report to someone. No specifics on who she reported giving report off to.

## 2023-01-06 NOTE — ED Notes (Addendum)
Ng tube placed at 60, l nostril, nurse confirmed placement. Xray order placed.

## 2023-01-06 NOTE — H&P (Signed)
History and Physical      KARYLE ANGLEN [id number] DOB: 1990-12-30 DOA: 01/05/2023  PCP: Jorge Ny, PA-C  Patient coming from: home   I have personally briefly reviewed patient's old medical records in [place]  Chief Complaint: abdominal pain  HPI: Sandra Foster is a 32 y.o. female with medical history significant for coronary disease status post right Hemi colectomy with ileostomy in September 2023, type 2 diabetes mellitus, who is admitted to [facility] on 01/05/2023 with high-grade small bowel obstruction after presenting from home to [facility] ED complaining of abdominal pain.   The patient was recently hospitalized at [facility] long from 01/01/2023 to 01/03/2023 with small bowel obstruction, which improved with conservative measures.  This is in the setting of her history of Crohn's disease with right hemicolectomy with ileostomy in September 2023.  She is scheduled to undergo ileostomy takedown in the near future.  This evening, she notes development of new sharp, nonradiating, generalized abdominal discomfort over the course of the last day associated with nausea resulting in these 2 episodes of nonbloody, nonbilious emesis over that timeframe.  Over the last day, she is also noted decline in ileostomy output.  Not associate with any melena or hematochezia.  Denies any associated subjective fever, chills, rigors, or generalized myalgias.  No associated any dysuria or gross hematuria.  Not on any blood thinners as an outpatient.  Denies any recent chest pain, shortness of breath palpitations, diaphoresis, dizziness, presyncope, or syncope.  No recent orthopnea, PND, or worsening peripheral edema.      ED Course:  Vital signs in the ED were notable for the following: Afebrile; heart rate in 70s; blood pressure 119/64; respiratory rate 18, oxygen saturation 98 to 100% on room air.  Labs were notable for the following: CMP notable for the following: Potassium 3.4,  bicarbonate 29, creatinine 0.82, glucose 114, liver enzymes within normal limits.  Lipase 43.  CBC notable for white cell count 10,800 with 73% neutrophils, hemoglobin 13.6.  Quantitative beta-hCG less than 5.  Urinalysis ordered, with result currently pending.  Imaging and additional notable ED work-up: CT abdomen/pelvis with contrast showed high-grade small obstruction with transition point at the end ileostomy formation, while noting the catheter threaded through the ileostomy appears to be in appropriate position, without any evidence of bowel perforation or abscess.  EDP discussed patient's case/imaging with the on-call general surgeon, Dr. Rosendo Gros, who recommended admission to the hospital service for further evaluation management of new high-grade small bowel obstruction.  Dr. Manley Mason has recommended placement of NG tube, n.p.o., and conveyed that general surgery will formally consult and see the patient in the morning, including to assist with reconciliation of plan regarding upcoming ileostomy takedown.  While in the ED, the following were administered: Dilaudid 1 mg IV x 2 doses, Zofran 4 mg IV x 2 doses, normal saline x 1 L bolus.  Subsequently, the patient was admitted for further evaluation management of presenting high-grade small bowel obstruction, with presenting labs notable for mild hypokalemia as well as leukocytosis.  red   Review of Systems: As per HPI otherwise 10 point review of systems negative.   Past Medical History:  Diagnosis Date   Anemia    Chlamydia    Crohn's disease ([place])    Diabetes mellitus without complication ([place])    Fetal demise    hyperglycemic hyperosmolar nonketonic syndrome   Hernia    Pregnancy induced hypertension    No meds   Pyelonephritis  Past Surgical History:  Procedure Laterality Date   BIOPSY  01/09/2022   Procedure: BIOPSY;  Surgeon: Carol Eladia, MD;  Location: [facility] ENDOSCOPY;  Service: Gastroenterology;;   COLONOSCOPY WITH  PROPOFOL N/A 01/09/2022   Procedure: COLONOSCOPY WITH PROPOFOL;  Surgeon: Carol Yamilette, MD;  Location: [place];  Service: Gastroenterology;  Laterality: N/A;   CYSTOSCOPY W/ URETERAL STENT PLACEMENT Bilateral 07/13/2022   Procedure: CYSTOSCOPY WITH STENT REPLACEMENT AND BILATERAL RETROGRADE;  Surgeon: Alexis Frock, MD;  Location: [facility] ORS;  Service: Urology;  Laterality: Bilateral;   HERNIA REPAIR     IR RADIOLOGIST EVAL & MGMT  06/28/2022   PARTIAL COLECTOMY Right 07/13/2022   Procedure: exploratory laparatomy, right hemi-colectomy, and ileostomy creation, and lysis of adhesions;  Surgeon: Ileana Roup, MD;  Location: [facility] ORS;  Service: General;  Laterality: Right;    Social History:  reports that she has quit smoking. Her smoking use included cigarettes. She has a 10.50 pack-year smoking history. She quit smokeless tobacco use about 11 years ago. She reports current alcohol use. She reports that she does not use drugs.   Allergies  Allergen Reactions   Nsaids Other (See Comments)    Crohn's disease = NO NSAIDs    Family History  Problem Relation Age of Onset   Hypertension Maternal Grandmother     Family history reviewed and not pertinent    Prior to Admission medications   Medication Sig Start Date End Date Taking? Authorizing Provider  acetaminophen (TYLENOL) 500 MG tablet Take 1 tablet (500 mg total) by mouth every 6 (six) hours as needed. 05/22/22  Yes Varney Biles, MD  etonogestrel (NEXPLANON) 68 MG IMPL implant 1 each by Subdermal route once.   Yes [provider]  metroNIDAZOLE (FLAGYL) 500 MG tablet Take by mouth. Patient not taking: Reported on 01/01/2023 12/28/22   [provider]  neomycin (MYCIFRADIN) 500 MG tablet Take 1,000 mg by mouth 3 (three) times daily. Patient not taking: Reported on 01/01/2023 12/28/22   [provider]  amitriptyline (ELAVIL) 25 MG tablet Take 1-2 tablets (25-50 mg total) by mouth at bedtime as needed  (headache). 03/25/20 06/18/20  Gregor Hams, MD     Objective    Physical Exam: Vitals:   01/05/23 2126 01/05/23 2134 01/05/23 2135 01/06/23 0136  BP:   119/64   Pulse:  71 72   Resp:   18   Temp:  98.9 F (37.2 C)  98.9 F (37.2 C)  TempSrc:  Oral  Oral  SpO2:  100% 100%   Weight: 68.5 kg     Height: '5\' 6"'$  (1.676 m)       General: appears to be stated age; alert, oriented Skin: warm, dry, no rash Head:  AT/[place] Mouth:  Oral mucosa membranes appear dry, normal dentition Neck: supple; trachea midline Heart:  RRR; did not appreciate any M/R/G Lungs: CTAB, did not appreciate any wheezes, rales, or rhonchi Abdomen: Hypoactive bowel sounds; soft, ND, generalized tenderness to palpation, in the absence of guarding, rigidity, rebound tenderness; ileostomy noted Vascular: 2+ pedal pulses b/l; 2+ radial pulses b/l Extremities: no peripheral edema, no muscle wasting Neuro: strength and sensation intact in upper and lower extremities b/l     Labs on Admission: I have personally reviewed following labs and imaging studies  CBC: Recent Labs  Lab 01/01/23 1116 01/02/23 0600 01/05/23 2155  WBC 15.8* 11.8* 10.8*  NEUTROABS 14.0*  --  7.8*  HGB 15.7* 14.2 13.6  HCT 45.3 41.9 40.7  MCV  85.2 87.1 87.2  PLT 279 261 [id number]   Basic Metabolic Panel: Recent Labs  Lab 01/01/23 1116 01/02/23 0600 01/03/23 0540 01/05/23 2155  NA 134* 138 137 138  K 3.2* 4.0 3.8 3.4*  CL 102 105 103 99  CO2 '23 26 27 29  '$ GLUCOSE 165* 122* 95 114*  BUN '17 17 18 7  '$ CREATININE 0.81 0.80 0.81 0.82  CALCIUM 9.7 9.0 8.3* 9.7   GFR: Estimated Creatinine Clearance: 93.1 mL/min (by C-G formula based on SCr of 0.82 mg/dL). Liver Function Tests: Recent Labs  Lab 01/01/23 1116 01/05/23 2155  AST 30 23  ALT 64* 39  ALKPHOS 50 51  BILITOT 0.8 0.7  PROT 8.6* 7.5  ALBUMIN 5.0 4.4   Recent Labs  Lab 01/01/23 1116 01/05/23 2155  LIPASE 31 43   No results for input(s): "AMMONIA" in the last 168  hours. Coagulation Profile: No results for input(s): "INR", "PROTIME" in the last 168 hours. Cardiac Enzymes: No results for input(s): "CKTOTAL", "CKMB", "CKMBINDEX", "TROPONINI" in the last 168 hours. BNP (last 3 results) No results for input(s): "PROBNP" in the last 8760 hours. HbA1C: No results for input(s): "HGBA1C" in the last 72 hours. CBG: Recent Labs  Lab 01/02/23 1946 01/02/23 2340 01/03/23 0429 01/03/23 0742 01/03/23 1139  GLUCAP 94 97 85 78 135*   Lipid Profile: No results for input(s): "CHOL", "HDL", "LDLCALC", "TRIG", "CHOLHDL", "LDLDIRECT" in the last 72 hours. Thyroid Function Tests: No results for input(s): "TSH", "T4TOTAL", "FREET4", "T3FREE", "THYROIDAB" in the last 72 hours. Anemia Panel: No results for input(s): "VITAMINB12", "FOLATE", "FERRITIN", "TIBC", "IRON", "RETICCTPCT" in the last 72 hours. Urine analysis:    Component Value Date/Time   COLORURINE STRAW (A) 07/19/2022 2149   APPEARANCEUR CLEAR 07/19/2022 2149   LABSPEC 1.006 07/19/2022 2149   PHURINE 8.0 07/19/2022 2149   GLUCOSEU NEGATIVE 07/19/2022 2149   HGBUR MODERATE (A) 07/19/2022 2149   BILIRUBINUR NEGATIVE 07/19/2022 2149   KETONESUR NEGATIVE 07/19/2022 2149   PROTEINUR NEGATIVE 07/19/2022 2149   UROBILINOGEN 0.2 08/11/2015 2134   NITRITE NEGATIVE 07/19/2022 2149   LEUKOCYTESUR NEGATIVE 07/19/2022 2149    Radiological Exams on Admission: DG Chest Portable 1 View  Result Date: 01/06/2023 CLINICAL DATA:  NG tube placement EXAM: PORTABLE CHEST 1 VIEW COMPARISON:  09/16/2022 chest radiograph, 01/02/2023 abdomen radiograph FINDINGS: NG tube tip overlies the stomach, with side port just below the diaphragm on the second image. Cardiac and mediastinal contours are within normal limits. No focal pulmonary opacity. No pleural effusion or pneumothorax. No acute osseous abnormality. IMPRESSION: NG tube tip overlies the stomach, with side port just below the diaphragm on the second image.  Electronically Signed   By: Merilyn Baba M.D.   On: 01/06/2023 01:35   CT ABDOMEN PELVIS W CONTRAST  Result Date: 01/06/2023 CLINICAL DATA:  Bowel obstruction suspected crohn's, recent SBO, bloating and vomiting EXAM: CT ABDOMEN AND PELVIS WITH CONTRAST TECHNIQUE: Multidetector CT imaging of the abdomen and pelvis was performed using the standard protocol following bolus administration of intravenous contrast. RADIATION DOSE REDUCTION: This exam was performed according to the departmental dose-optimization program which includes automated exposure control, adjustment of the mA and/or kV according to patient size and/or use of iterative reconstruction technique. CONTRAST:  161m OMNIPAQUE IOHEXOL 300 MG/ML  SOLN COMPARISON:  CT abdomen pelvis 01/08/2022 FINDINGS: Lower chest: No acute abnormality. Hepatobiliary: Persistent indeterminate vague slightly heterogeneous hypodensity along the right inferior hepatic lobe (5:45). No gallstones, gallbladder wall thickening, or pericholecystic fluid. No biliary dilatation.  Pancreas: No focal lesion. Normal pancreatic contour. No surrounding inflammatory changes. No main pancreatic ductal dilatation. Spleen: Normal in size without focal abnormality. Adrenals/Urinary Tract: No adrenal nodule bilaterally. Bilateral kidneys enhance symmetrically. No hydronephrosis. No hydroureter. The urinary bladder is unremarkable. Stomach/Bowel: End ileostomy formation at the right lower quadrant. Catheter threaded through the end ileostomy noted. Stomach is within normal limits. Under distended stomach. The majority of the jejunum, ileum, and terminal ileum are dilated with fluid with a transition point at the end ileostomy formation. The small bowel measures up to 4.6 cm. Short segment mid small bowel wall thickening and decreased caliber likely due to peristalsis and non dilatation. No associated fat stranding or combing of the vasculature. No evidence of large bowel wall thickening or  dilatation. Fully collapsed large bowel. Status post appendectomy. Vague swirling of the mesentery within the right mid abdomen in no definite association with a transition point. Several prominent lymph nodes are noted in this area (2:49). Vascular/Lymphatic: No abdominal aorta or iliac aneurysm. No abdominal, pelvic, or inguinal lymphadenopathy. Reproductive: Uterus and bilateral adnexa are unremarkable. Other: No intraperitoneal free fluid. No intraperitoneal free gas. No organized fluid collection. Musculoskeletal: No abdominal wall hernia or abnormality. No suspicious lytic or blastic osseous lesions. No acute displaced fracture. IMPRESSION: 1. High-grade small-bowel obstruction with transition point at the end ileostomy formation. Catheter threaded through the ileostomy in appropriate position. 2. Persistent indeterminate vague slightly heterogeneous hypodensity along the right inferior hepatic lobe. Finding likely of benign etiology. Electronically Signed   By: Iven Finn M.D.   On: 01/06/2023 00:04      Assessment/Plan    Principal Problem:   Small bowel obstruction (HCC) Active Problems:   DM2 (diabetes mellitus, type 2) (HCC)   Hypokalemia   Abdominal pain   Nausea & vomiting   Leukocytosis     #) Small bowel obstruction: dx on the basis of 1 day of worsening abdominal pain associated with nausea/vomiting and diminished ileostomy output, with CT abdomen/pelvis, demonstrating evidence of high-grade small bowel obstruction with transition point at the end ileostomy formation, in the absence of any evidence of perforation or abscess.  This is in the context of her history of Crohn's disease status post retiming colectomy with ileostomy in September 2023, with upcoming plan for ileostomy takedown.  No evidence of peritoneal signs on initial physical exam. Will initiate conservative measures, and observe for return of normal bowel function, as described below.  NGT placed.  Case and  imaging were discussed with the on-call general surgeon, Dr. Rosendo Gros, who recommended admission to the hospitalist service for further evaluation and management of presenting SBO, including the conservative measures as described below. Dr. Rosendo Gros to consult, and plans to see patient in the AM.     Plan: Strict NPO. LR '@100'$  cc per hour. Prn IV Dilaudid. Prn IV Zofran. NG tube attached to low, intermittent wall suction. SCD's for DVT prophylaxis in case the patient should ultimately require surgical intervention.  Recheck CMP and CBC in the morning. Check INR. General surgery consult, as above.              #) Leukocytosis: Presenting CBC reflects mildly elevated wbc count of 10,800, which is suspected to be inflammatory in nature in the setting of presenting SBO as well as her known underlying inflammatory bowel disease, with an additional element of hemoconcentration in the context of dehydration, with remainder of CBC results also suggestive of corresponding hemoconcentration.  No clinical evidence to suggest underlying infectious  process at this time, including no e/o bowel perforation or abscess . In the absence of suspected underlying infxn, criteria for sepsis not currently met. Consequently, will refrain from initiation of abx at this time.    Plan: Repeat CBC with diff in the AM.  IV fluids, as above.  Follow-up result urinalysis.              #) Hypokalemia: Presenting serum potassium level noted to be mildly low at 3.4, which appears to be as a consequence of recent increase in GI losses in the forming of presenting nausea/vomiting as a component of her high-grade small bowel obstruction, and associated inability to tolerate p.o. over the course of the last day.  Plan: Potassium chloride 40 mill equivalents IV over 4 hours x 1 dose now.  Continuous lactated Ringer's, as above.  Add on serum magnesium level.  Repeat CMP in the morning.            #) Type 2 Diabetes  [place]: documented history of such.  Managed via lifestyle modifications in the absence of any insulin or oral hypoglycemic agents as an outpatient.  presenting blood sugar: 114. Most recent A1c noted to be 6.3% when checked on 12/28/2022.   Plan: In the setting of current n.p.o. status, every 6 hours with low-dose sliding scale insulin.         DVT prophylaxis: SCD's   Code Status: Full code Family Communication: none Disposition Plan: Per Rounding Team Consults called:  EDP discussed patient's case/imaging with the on-call general surgeon, Dr. Rosendo Gros, As further detailed above;  Admission status: Inpatient    I SPENT GREATER THAN 75  MINUTES IN CLINICAL CARE TIME/MEDICAL DECISION-MAKING IN COMPLETING THIS ADMISSION.     [place] DO [facility] [place]   01/06/2023, 1:42 AM

## 2023-01-07 ENCOUNTER — Inpatient Hospital Stay: Payer: Self-pay

## 2023-01-07 DIAGNOSIS — K56609 Unspecified intestinal obstruction, unspecified as to partial versus complete obstruction: Secondary | ICD-10-CM | POA: Diagnosis not present

## 2023-01-07 LAB — GLUCOSE, CAPILLARY
Glucose-Capillary: 70 mg/dL (ref 70–99)
Glucose-Capillary: 78 mg/dL (ref 70–99)
Glucose-Capillary: 80 mg/dL (ref 70–99)

## 2023-01-07 LAB — BASIC METABOLIC PANEL
Anion gap: 8 (ref 5–15)
BUN: 5 mg/dL — ABNORMAL LOW (ref 6–20)
CO2: 28 mmol/L (ref 22–32)
Calcium: 8.7 mg/dL — ABNORMAL LOW (ref 8.9–10.3)
Chloride: 101 mmol/L (ref 98–111)
Creatinine, Ser: 0.57 mg/dL (ref 0.44–1.00)
GFR, Estimated: >60 mL/min (ref >60)
Glucose, Bld: 81 mg/dL (ref 70–99)
Potassium: 3.4 mmol/L — ABNORMAL LOW (ref 3.5–5.1)
Sodium: 137 mmol/L (ref 135–145)

## 2023-01-07 MED ORDER — ENSURE ENLIVE PO LIQD
237.0000 mL | Freq: Two times a day (BID) | ORAL | Status: DC
  Administered 2023-01-07: 237 mL

## 2023-01-07 MED ORDER — LACTATED RINGERS IV SOLN: INTRAVENOUS | Status: DC

## 2023-01-07 MED ORDER — POTASSIUM CHLORIDE 10 MEQ/100ML IV SOLN
10.0000 meq | INTRAVENOUS | Status: AC
  Administered 2023-01-07 (×4): 10 meq via INTRAVENOUS
  Filled 2023-01-07: qty 100

## 2023-01-07 MED ORDER — INSULIN ASPART 100 UNIT/ML IJ SOLN
0.0000 [IU] | Freq: Four times a day (QID) | INTRAMUSCULAR | Status: DC
  Administered 2023-01-08 (×2): 1 [IU] via SUBCUTANEOUS
  Administered 2023-01-09: 2 [IU] via SUBCUTANEOUS
  Administered 2023-01-09: 1 [IU] via SUBCUTANEOUS
  Administered 2023-01-10 (×2): 3 [IU] via SUBCUTANEOUS

## 2023-01-07 MED ORDER — CHLORHEXIDINE GLUCONATE CLOTH 2 % EX PADS
6.0000 | MEDICATED_PAD | Freq: Every day | CUTANEOUS | Status: DC
  Administered 2023-01-07 – 2023-01-08 (×2): 6 via TOPICAL

## 2023-01-07 MED ORDER — SODIUM CHLORIDE 0.9% FLUSH: 10.0000 mL | INTRAVENOUS | Status: DC | PRN

## 2023-01-07 MED ORDER — DEXTROSE IN LACTATED RINGERS 5 % IV SOLN: INTRAVENOUS | Status: DC

## 2023-01-07 MED ORDER — ENOXAPARIN SODIUM 40 MG/0.4ML IJ SOSY
40.0000 mg | PREFILLED_SYRINGE | Freq: Every day | INTRAMUSCULAR | Status: DC
  Administered 2023-01-07 – 2023-01-14 (×5): 40 mg via SUBCUTANEOUS
  Filled 2023-01-07 (×6): qty 0.4

## 2023-01-07 MED ORDER — TRAVASOL 10 % IV SOLN
INTRAVENOUS | Status: AC
  Filled 2023-01-07: qty 441.6

## 2023-01-07 NOTE — Progress Notes (Signed)
Initial Nutrition Assessment  INTERVENTION:   Monitor magnesium, potassium, and phosphorus BID for at least 3 days, MD to replete as needed, as pt is at risk for refeeding syndrome. -Recommend 100 mg Thiamine daily x 5 days  -TPN management per Pharmacy -Daily weights while on TPN  -Ensure Plus High Protein po BID, each supplement provides 350 kcal and 20 grams of protein while NGT clamped.  NUTRITION DIAGNOSIS:   Inadequate oral intake related to  (recurrent SBOs) as evidenced by NPO status.  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  Patient familiar to RD from previous admission. Seen 3/6, pt was discharged from that admission on 3/7. Pt states that night she started to have abdominal pain and N/V. Patient states she last ate 5 days ago, coffee and crackers. During that previous admission, pt was mainly consuming liquids like grits, jello, hot chocolate and broths. Pt states she never got an Ensure during that admission. Pt was eating better since September 2023 at that time.   Pt scheduled for ileostomy takedown on 3/13.   PICC was placed today. TPN to start today at 40 ml/hr, providing 888 kcals and 44g protein.  Surgery allowing pt to have Ensure while NGT clamped.  Patient unable to tell me if she was weighed at admission. Current weight recorded: 150 lbs. Will order for daily weights while patient on TPN.  Medications: KCl, Zofran  Labs reviewed: CBGs: 70-84 Low K   NUTRITION - FOCUSED PHYSICAL EXAM:  No depletions noted.  Diet Order:   Diet Order             Diet NPO time  specified  Diet effective now                   EDUCATION NEEDS:   Education needs have been addressed  Skin:  Skin Assessment: Reviewed RN Assessment  Last BM:  3/11 -ileostomy  Height:   Ht Readings from Last 1 Encounters:  01/05/23 '5\' 6"'$  (1.676 m)    Weight:   Wt Readings from Last 1 Encounters:  01/07/23 68.2 kg    BMI:  Body mass index is 24.27 kg/m.  Estimated Nutritional Needs:   Kcal:  1850-2050  Protein:  85-95g  Fluid:  2L/day  Clayton Bibles, MS, RD, LDN Inpatient Clinical Dietitian Contact information available via Amion

## 2023-01-07 NOTE — Progress Notes (Signed)
PHARMACY - TOTAL PARENTERAL NUTRITION CONSULT NOTE   Indication: Small bowel obstruction  Patient Measurements: Height: '5\' 6"'$  (167.6 cm) Weight: 68.5 kg (151 lb 0.2 oz) IBW/kg (Calculated) : 59.3 TPN AdjBW (KG): 68.5 Body mass index is 24.37 kg/m. 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 Dema Severin for perforating ileocolic Crohn's disease.  Patient returned with SBO.   Scheduled for ileostomy takedown 01/09/23 with Dr. Dema Severin - given poor intake recently and potential for ileus post op will start TPN today   Glucose / Insulin: Range 70-84 Electrolytes: Potassium low, and Calcium slightly low. Renal: Scr=0.57 Hepatic: LFTs WNL; T. Bili WNL Intake / Output; MIVF:  Will reassess when tpn starts GI Imaging:  GI Surgeries / Procedures:  01/09/23: Scheduled for ileostomy takedown   Central access: 01/07/23 TPN start date: 01/07/23  Nutritional Goals: Goal TPN rate is 85 mL/hr (provides 94 g of protein and 1889 kcals per day)  RD Assessment:pending     Current Nutrition:  NPO  Plan:  Start TPN at 54m/hr at 1800 Electrolytes in TPN: Na 518m/L, K 5064mL, Ca 5mE52m, Mg 5mEq13m and Phos 15mmo65m Cl:Ac 1:1 Add standard MVI and trace elements to TPN Initiate Sensitive q6h SSI and adjust as needed  Reduce MIVF to 60 mL/hr at 1800 Monitor TPN labs on Mon/Thurs, and PRN  Nnenna Meador W GregPietro Cassis2024,11:07 AM

## 2023-01-07 NOTE — Progress Notes (Signed)
Subjective/Chief Complaint: Abdominal pain remains improved. Still distended but this is overall improved as well. NGT clamped yesterday for shower and was a little nauseas during this time but no emesis and none since. Ostomy has been productive  Objective: Vital signs in last 24 hours: Temp:  [97.8 F (36.6 C)-98.6 F (37 C)] 98.4 F (36.9 C) (03/11 0500) Pulse Rate:  [58-66] 60 (03/11 0500) Resp:  [16-18] 16 (03/11 0500) BP: (88-112)/(46-64) 107/51 (03/11 0500) SpO2:  [99 %-100 %] 100 % (03/11 0500) Last BM Date : 01/03/23  Intake/Output from previous day: 03/10 0701 - 03/11 0700 In: 573.9 [I.V.:573.9] Out: 1925 [Urine:1000; Emesis/NG output:475; Stool:450] Intake/Output this shift: Total I/O In: 419.4 [I.V.:319.4; IV Piggyback:100] Out: -   PE:  Constitutional: No acute distress, conversant, appears stated age. Lungs: normal respiratory effort on room air GI: Soft, no masses or hepatosplenomegaly, non-tender to palpation. Stoma pink and viable with red rubber and liquid stool in bag. Mildly distended. NGT with thin output Skin: No rashes, palpation reveals normal turgor Psychiatric: appropriate judgment and insight, oriented to person, place, and time   Lab Results:  Recent Labs    01/05/23 2155 01/06/23 0523  WBC 10.8* 7.7  HGB 13.6 12.0  HCT 40.7 35.7*  PLT 253 227    BMET Recent Labs    01/06/23 0523 01/07/23 0359  NA 132* 137  K 3.6 3.4*  CL 103 101  CO2 24 28  GLUCOSE 101* 81  BUN 7 5*  CREATININE 0.69 0.57  CALCIUM 8.3* 8.7*    PT/INR Recent Labs    01/06/23 0523  LABPROT 15.6*  INR 1.3*    ABG No results for input(s): "PHART", "HCO3" in the last 72 hours.  Invalid input(s): "PCO2", "PO2"  Studies/Results: DG Chest Portable 1 View  Result Date: 01/06/2023 CLINICAL DATA:  NG tube placement EXAM: PORTABLE CHEST 1 VIEW COMPARISON:  09/16/2022 chest radiograph, 01/02/2023 abdomen radiograph FINDINGS: NG tube tip overlies the  stomach, with side port just below the diaphragm on the second image. Cardiac and mediastinal contours are within normal limits. No focal pulmonary opacity. No pleural effusion or pneumothorax. No acute osseous abnormality. IMPRESSION: NG tube tip overlies the stomach, with side port just below the diaphragm on the second image. Electronically Signed   By: Merilyn Baba M.D.   On: 01/06/2023 01:35   CT ABDOMEN PELVIS W CONTRAST  Result Date: 01/06/2023 CLINICAL DATA:  Bowel obstruction suspected crohn's, recent SBO, bloating and vomiting EXAM: CT ABDOMEN AND PELVIS WITH CONTRAST TECHNIQUE: Multidetector CT imaging of the abdomen and pelvis was performed using the standard protocol following bolus administration of intravenous contrast. RADIATION DOSE REDUCTION: This exam was performed according to the departmental dose-optimization program which includes automated exposure control, adjustment of the mA and/or kV according to patient size and/or use of iterative reconstruction technique. CONTRAST:  147m OMNIPAQUE IOHEXOL 300 MG/ML  SOLN COMPARISON:  CT abdomen pelvis 01/08/2022 FINDINGS: Lower chest: No acute abnormality. Hepatobiliary: Persistent indeterminate vague slightly heterogeneous hypodensity along the right inferior hepatic lobe (5:45). No gallstones, gallbladder wall thickening, or pericholecystic fluid. No biliary dilatation. Pancreas: No focal lesion. Normal pancreatic contour. No surrounding inflammatory changes. No main pancreatic ductal dilatation. Spleen: Normal in size without focal abnormality. Adrenals/Urinary Tract: No adrenal nodule bilaterally. Bilateral kidneys enhance symmetrically. No hydronephrosis. No hydroureter. The urinary bladder is unremarkable. Stomach/Bowel: End ileostomy formation at the right lower quadrant. Catheter threaded through the end ileostomy noted. Stomach is within normal limits. Under distended stomach.  The majority of the jejunum, ileum, and terminal ileum are  dilated with fluid with a transition point at the end ileostomy formation. The small bowel measures up to 4.6 cm. Short segment mid small bowel wall thickening and decreased caliber likely due to peristalsis and non dilatation. No associated fat stranding or combing of the vasculature. No evidence of large bowel wall thickening or dilatation. Fully collapsed large bowel. Status post appendectomy. Vague swirling of the mesentery within the right mid abdomen in no definite association with a transition point. Several prominent lymph nodes are noted in this area (2:49). Vascular/Lymphatic: No abdominal aorta or iliac aneurysm. No abdominal, pelvic, or inguinal lymphadenopathy. Reproductive: Uterus and bilateral adnexa are unremarkable. Other: No intraperitoneal free fluid. No intraperitoneal free gas. No organized fluid collection. Musculoskeletal: No abdominal wall hernia or abnormality. No suspicious lytic or blastic osseous lesions. No acute displaced fracture. IMPRESSION: 1. High-grade small-bowel obstruction with transition point at the end ileostomy formation. Catheter threaded through the ileostomy in appropriate position. 2. Persistent indeterminate vague slightly heterogeneous hypodensity along the right inferior hepatic lobe. Finding likely of benign etiology. Electronically Signed   By: Iven Finn M.D.   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 Dema Severin for perforating ileocolic Crohn's disease - Pt with return of SBO - improved following NGT placement. 475 ml/24 hrs charted - having ostomy output. NGT clamping trial today. Can have ensure while clamped. Back to suction for worsening n/v/abdominal pain or distension -Scheduled for ileostomy takedown 01/09/23 with Dr. Dema Severin - given poor intake recently and potential for ileus post op will start TPN today  FEN - NGT clamping trial. Start TPN ID - none  VTE - SCDs,  lovenox Foley - none  Winferd Humphrey, [facility] 01/07/2023, 9:25 AM Please see Amion for pager number during day hours 7:00am-4:30pm\   LOS: 1 day

## 2023-01-07 NOTE — Progress Notes (Signed)
Notified J. Olena Heckle, NP of pt's current blood sugar of 70, asymptomatic and current status of NPO and her current IV fluids. Orders received.

## 2023-01-07 NOTE — Progress Notes (Signed)
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  She was just discharged on 3/7 when she was admitted for small bowel obstruction, general surgery cleared for discharge at this time.  After she went home, she developed new generalized abdominal discomfort with vomiting.  On presentation, she was hemodynamically stable.  CT abdomen/pelvis showed high-grade small bowel obstruction with transition point at the end ileostomy formation.  General surgery consulted.  NG tube placed.  SBO has again resolved, she is having output in the ostomy.  General surgery planning for ileostomy takedown on 3/13 by Dr. Dema Severin.  Also started on TPN.  Assessment & Plan:  Principal Problem:   Small bowel obstruction (HCC) Active Problems:   DM2 (diabetes mellitus, type 2) (HCC)   Hypokalemia   Abdominal pain   Nausea & vomiting   Leukocytosis  SBO: Presented again with abdomen pain, nausea and vomiting.  She was discharged from here on 3/7 after she was admitted for SBO.   CT abdomen/pelvis showed high-grade small bowel obstruction with transition point at the end ileostomy formation.  General surgery consulted.  NG tube placed. Abdominal pain, nausea and vomiting have significantly improved.She again started having bowel movement, presence of stool in the ileostomy bag.  Abdomen is not  distendeded today.  NG tube in place. Plan for ileostomy takedown on 01/09/2023 with Dr. Dema Severin. Also started on TPN  Crohn's disease: History of Crohn's disease with right hemicolectomy with end ileostomy because of perforated ileocolic Crohn's with mesenteric abscess.  Currently on monthly infusion of Remicade.  GI  consulted here on last admission, her condition was thought not to be secondary to Crohn's disease            DVT prophylaxis:enoxaparin (LOVENOX) injection 40 mg Start: 01/07/23 1100 SCDs Start: 01/06/23 0125     Code Status: Full Code  Family Communication: None at bed side  Patient status:Inpatient  Patient is from :Home  Anticipated discharge [id number]  Estimated DC date:after general surgery clearance   Consultants: General surgery  Procedures: NG tube placement  Antimicrobials:  Anti-infectives (From admission, onward)    None       Subjective: Patient seen and examined at bedside today.  Hemodynamically stable.  Overall comfortable, denies any nausea, vomiting or significant abdominal pain.  Still has been coming through the ileostomy into the bag.  She complains of mild headache.  Objective: Vitals:   01/06/23 2135 01/07/23 0227 01/07/23 0500 01/07/23 1056  BP: (!) 88/46 107/61 (!) 107/51   Pulse: 66 (!) 58 60   Resp: '16 18 16   '$ Temp: 98.2 F (36.8 C) 98.6 F (37 C) 98.4 F (36.9 C)   TempSrc: Oral Oral Oral   SpO2: 99% 100% 100%   Weight:    68.2 kg  Height:        Intake/Output Summary (Last 24 hours) at 01/07/2023 1152 Last data filed at 01/07/2023 0903 Gross per 24 hour  Intake 993.29 ml  Output 1900 ml  Net -906.71 ml   Filed Weights   01/05/23 2126 01/07/23 1056  Weight: 68.5 kg 68.2 kg    Examination:   General exam: Overall comfortable, not in distress HEENT: PERRL,NG tube Respiratory system:  no wheezes or  crackles  Cardiovascular system: S1 & S2 heard, RRR.  Gastrointestinal system: Abdomen is mildly distended, soft and nontender.  Ileostomy with stool Central nervous system: Alert and oriented Extremities: No edema, no clubbing ,no cyanosis Skin: No rashes, no ulcers,no icterus     Data Reviewed: I have personally reviewed following labs and imaging studies  CBC: Recent Labs  Lab 01/01/23 1116 01/02/23 0600  01/05/23 2155 01/06/23 0523  WBC 15.8* 11.8* 10.8* 7.7  NEUTROABS 14.0*  --  7.8* 4.2  HGB 15.7* 14.2 13.6 12.0  HCT 45.3 41.9 40.7 35.7*  MCV 85.2 87.1 87.2 87.7  PLT 279 261 253 [id number]   Basic Metabolic Panel: Recent Labs  Lab 01/02/23 0600 01/03/23 0540 01/05/23 2155 01/06/23 0523 01/07/23 0359  NA 138 137 138 132* 137  K 4.0 3.8 3.4* 3.6 3.4*  CL 105 103 99 103 101  CO2 '26 27 29 24 28  '$ GLUCOSE 122* 95 114* 101* 81  BUN '17 18 7 7 '$ 5*  CREATININE 0.80 0.81 0.82 0.69 0.57  CALCIUM 9.0 8.3* 9.7 8.3* 8.7*  MG  --   --   --  2.1  --      No results found for this or any previous visit (from the past 240 hour(s)).   Radiology Studies: [place] EKG SITE RITE  Result Date: 01/07/2023 If Site Rite image not attached, placement could not be confirmed due to current cardiac rhythm.  DG Chest Portable 1 View  Result Date: 01/06/2023 CLINICAL DATA:  NG tube placement EXAM: PORTABLE CHEST 1 VIEW COMPARISON:  09/16/2022 chest radiograph, 01/02/2023 abdomen radiograph FINDINGS: NG tube tip overlies the stomach, with side port just below the diaphragm on the second image. Cardiac and mediastinal contours are within normal limits. No focal pulmonary opacity. No pleural effusion or pneumothorax. No acute osseous abnormality. IMPRESSION: NG tube tip overlies the stomach, with side port just below the diaphragm on the second image. Electronically Signed   By: Merilyn Baba M.D.   On: 01/06/2023 01:35   CT ABDOMEN PELVIS W CONTRAST  Result Date: 01/06/2023 CLINICAL DATA:  Bowel obstruction suspected crohn's, recent SBO, bloating and vomiting EXAM: CT ABDOMEN AND PELVIS WITH CONTRAST TECHNIQUE: Multidetector CT imaging of the abdomen and pelvis was performed using the standard protocol following bolus administration of intravenous contrast. RADIATION DOSE REDUCTION: This exam was performed according to the departmental dose-optimization program which includes automated exposure control, adjustment of  the mA and/or kV according to patient size and/or use of iterative reconstruction technique. CONTRAST:  18m OMNIPAQUE IOHEXOL 300 MG/ML  SOLN COMPARISON:  CT abdomen pelvis 01/08/2022 FINDINGS: Lower chest: No acute abnormality. Hepatobiliary: Persistent indeterminate vague slightly heterogeneous hypodensity along the right inferior hepatic lobe (5:45). No gallstones, gallbladder wall thickening, or pericholecystic fluid. No biliary dilatation. Pancreas: No focal lesion. Normal pancreatic contour. No surrounding inflammatory changes. No main pancreatic ductal dilatation. Spleen: Normal in size without focal abnormality. Adrenals/Urinary Tract: No adrenal nodule bilaterally. Bilateral kidneys enhance symmetrically. No hydronephrosis. No hydroureter. The urinary bladder is unremarkable. Stomach/Bowel: End ileostomy formation at the right lower quadrant. Catheter threaded through the end ileostomy noted. Stomach is within normal limits. Under distended stomach. The majority of the jejunum, ileum, and terminal ileum are dilated with fluid with a transition point at the end ileostomy formation. The small bowel measures up to 4.6 cm. Short segment mid small bowel wall thickening and decreased caliber likely due to peristalsis and non dilatation. No associated fat stranding or combing of the vasculature.  No evidence of large bowel wall thickening or dilatation. Fully collapsed large bowel. Status post appendectomy. Vague swirling of the mesentery within the right mid abdomen in no definite association with a transition point. Several prominent lymph nodes are noted in this area (2:49). Vascular/Lymphatic: No abdominal aorta or iliac aneurysm. No abdominal, pelvic, or inguinal lymphadenopathy. Reproductive: Uterus and bilateral adnexa are unremarkable. Other: No intraperitoneal free fluid. No intraperitoneal free gas. No organized fluid collection. Musculoskeletal: No abdominal wall hernia or abnormality. No suspicious  lytic or blastic osseous lesions. No acute displaced fracture. IMPRESSION: 1. High-grade small-bowel obstruction with transition point at the end ileostomy formation. Catheter threaded through the ileostomy in appropriate position. 2. Persistent indeterminate vague slightly heterogeneous hypodensity along the right inferior hepatic lobe. Finding likely of benign etiology. Electronically Signed   By: Iven Finn M.D.   On: 01/06/2023 00:04    Scheduled Meds:  enoxaparin (LOVENOX) injection  40 mg Subcutaneous Daily   feeding supplement  237 mL Per Tube BID BM   insulin aspart  0-6 Units Subcutaneous Q6H   insulin aspart  0-9 Units Subcutaneous Q6H   Continuous Infusions:  lactated ringers 100 mL/hr at 01/07/23 0756   lactated ringers     potassium chloride 10 mEq (01/07/23 1106)   TPN ADULT (ION)       LOS: 1 day   Shelly Coss, MD [facility] P3/08/2023, 11:52 AM

## 2023-01-07 NOTE — Progress Notes (Signed)
Peripherally Inserted Central Catheter Placement  The IV Nurse has discussed with the patient and/or persons authorized to consent for the patient, the purpose of this procedure and the potential benefits and risks involved with this procedure.  The benefits include less needle sticks, lab draws from the catheter, and the patient may be discharged home with the catheter. Risks include, but not limited to, infection, bleeding, blood clot (thrombus formation), and puncture of an artery; nerve damage and irregular heartbeat and possibility to perform a PICC exchange if needed/ordered by physician.  Alternatives to this procedure were also discussed.  Bard Power PICC patient education guide, fact sheet on infection prevention and patient information card has been provided to patient /or left at bedside.    PICC Placement Documentation  PICC Double Lumen 01/07/23 Right Brachial 37 cm 0 cm (Active)  Indication for Insertion or Continuance of Line Administration of hyperosmolar/irritating solutions (i.e. TPN, Vancomycin, etc.) 01/07/23 1310  Exposed Catheter (cm) 0 cm 01/07/23 1310  Site Assessment Clean, Dry, Intact 01/07/23 1310  Lumen #1 Status Flushed;Saline locked;Blood return noted 01/07/23 1310  Lumen #2 Status Flushed;Saline locked;Blood return noted 01/07/23 1310  Dressing Type Transparent;Securing device 01/07/23 1310  Dressing Status Antimicrobial disc in place 01/07/23 1310  Dressing Intervention New dressing;Other (Comment) 01/07/23 1310  Dressing Change Due 01/14/23 01/07/23 1310       Sandra Foster 01/07/2023, 1:24 PM

## 2023-01-08 DIAGNOSIS — K56609 Unspecified intestinal obstruction, unspecified as to partial versus complete obstruction: Secondary | ICD-10-CM | POA: Diagnosis not present

## 2023-01-08 LAB — GLUCOSE, CAPILLARY
Glucose-Capillary: 118 mg/dL — ABNORMAL HIGH (ref 70–99)
Glucose-Capillary: 121 mg/dL — ABNORMAL HIGH (ref 70–99)
Glucose-Capillary: 131 mg/dL — ABNORMAL HIGH (ref 70–99)
Glucose-Capillary: 145 mg/dL — ABNORMAL HIGH (ref 70–99)

## 2023-01-08 LAB — COMPREHENSIVE METABOLIC PANEL
ALT: 21 U/L (ref 0–44)
AST: 15 U/L (ref 15–41)
Albumin: 3.7 g/dL (ref 3.5–5.0)
Alkaline Phosphatase: 36 U/L — ABNORMAL LOW (ref 38–126)
Anion gap: 8 (ref 5–15)
BUN: <5 mg/dL — ABNORMAL LOW (ref 6–20)
CO2: 27 mmol/L (ref 22–32)
Calcium: 8.7 mg/dL — ABNORMAL LOW (ref 8.9–10.3)
Chloride: 101 mmol/L (ref 98–111)
Creatinine, Ser: 0.68 mg/dL (ref 0.44–1.00)
GFR, Estimated: >60 mL/min (ref >60)
Glucose, Bld: 120 mg/dL — ABNORMAL HIGH (ref 70–99)
Potassium: 3.6 mmol/L (ref 3.5–5.1)
Sodium: 136 mmol/L (ref 135–145)
Total Bilirubin: 0.5 mg/dL (ref 0.3–1.2)
Total Protein: 6.1 g/dL — ABNORMAL LOW (ref 6.5–8.1)

## 2023-01-08 LAB — TRIGLYCERIDES: Triglycerides: 80 mg/dL (ref <150)

## 2023-01-08 LAB — PHOSPHORUS: Phosphorus: 4.4 mg/dL (ref 2.5–4.6)

## 2023-01-08 LAB — MAGNESIUM: Magnesium: 2.7 mg/dL — ABNORMAL HIGH (ref 1.7–2.4)

## 2023-01-08 MED ORDER — LACTATED RINGERS IV SOLN: INTRAVENOUS | Status: DC

## 2023-01-08 MED ORDER — TRAVASOL 10 % IV SOLN
INTRAVENOUS | Status: AC
  Filled 2023-01-08: qty 662.4

## 2023-01-08 MED ORDER — ENSURE ENLIVE PO LIQD
237.0000 mL | Freq: Three times a day (TID) | ORAL | Status: DC
  Administered 2023-01-08 – 2023-01-13 (×12): 237 mL

## 2023-01-08 MED ORDER — THIAMINE HCL 100 MG/ML IJ SOLN
100.0000 mg | Freq: Every day | INTRAMUSCULAR | Status: DC
  Administered 2023-01-08: 100 mg via INTRAVENOUS
  Filled 2023-01-08: qty 2

## 2023-01-08 NOTE — Progress Notes (Signed)
   Subjective/Chief Complaint: Abdominal pain remains improved. No  distention, NG now gastric/clear effluent. Ostomy has been productive  Objective: Vital signs in last 24 hours: Temp:  [98.1 F (36.7 C)-98.3 F (36.8 C)] 98.3 F (36.8 C) (03/12 0600) Pulse Rate:  [65-76] 76 (03/12 0600) Resp:  [16-18] 18 (03/12 0600) BP: (110-127)/(58-74) 110/58 (03/12 0600) SpO2:  [99 %-100 %] 99 % (03/12 0600) Weight:  [68.2 kg] 68.2 kg (03/11 1056) Last BM Date : 01/03/23  Intake/Output from previous day: 03/11 0701 - 03/12 0700 In: 841.1 [I.V.:741.1; IV Piggyback:100] Out: 1700 [Urine:1400; Stool:300] Intake/Output this shift: No intake/output data recorded.  PE:  Constitutional: No acute distress, conversant, appears stated age. Lungs: normal respiratory effort on room air GI: Soft, no masses or hepatosplenomegaly, non-tender to palpation. Stoma pink and viable with red rubber and liquid stool in bag. Mildly distended. NGT with thin output Skin: No rashes, palpation reveals normal turgor Psychiatric: appropriate judgment and insight, oriented to person, place, and time   Lab Results:  Recent Labs    01/05/23 2155 01/06/23 0523  WBC 10.8* 7.7  HGB 13.6 12.0  HCT 40.7 35.7*  PLT 253 227   BMET Recent Labs    01/07/23 0359 01/08/23 0533  NA 137 136  K 3.4* 3.6  CL 101 101  CO2 28 27  GLUCOSE 81 120*  BUN 5* <5*  CREATININE 0.57 0.68  CALCIUM 8.7* 8.7*   PT/INR Recent Labs    01/06/23 0523  LABPROT 15.6*  INR 1.3*   ABG No results for input(s): "PHART", "HCO3" in the last 72 hours.  Invalid input(s): "PCO2", "PO2"  Studies/Results: [place] EKG SITE RITE  Result Date: 01/07/2023 If Site Rite image not attached, placement could not be confirmed due to current cardiac rhythm.      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 NGT clamp. Can have ensure while clamped. -Scheduled for ileostomy takedown 01/09/23 with myself - given poor intake recently and potential for ileus post op will continue TPN perioperatively  FEN - NGT clamping. TID protein shake today; continue TPN. ID - none  VTE - SCDs, lovenox Foley - none  Ileana Roup, MD [facility] 01/08/2023, 8:30 AM  I spent a total of 40 minutes in both face-to-face and non-face-to-face activities, excluding procedures performed, for this visit on the date of this encounter.    LOS: 2 days

## 2023-01-08 NOTE — Progress Notes (Signed)
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  She was just discharged on 3/7 when she was admitted for small bowel obstruction, general surgery cleared for discharge at this time.  After she went home, she developed new generalized abdominal discomfort with vomiting.  On presentation, she was hemodynamically stable.  CT abdomen/pelvis showed high-grade small bowel obstruction with transition point at the end ileostomy formation.  General surgery consulted.  NG tube placed.  SBO has seemed to be resolved, she is having output in the ostomy.  General surgery planning for ileostomy takedown on 3/13 by Dr. Dema Severin.  Also started on TPN.  Assessment & Plan:  Principal Problem:   Small bowel obstruction (HCC) Active Problems:   DM2 (diabetes mellitus, type 2) (HCC)   Hypokalemia   Abdominal pain   Nausea & vomiting   Leukocytosis  SBO: Presented again with abdomen pain, nausea and vomiting.  She was discharged from here on 3/7 after she was admitted for SBO.   CT abdomen/pelvis showed high-grade small bowel obstruction with transition point at the end ileostomy formation.  General surgery consulted.  NG tube placed,now clamped. Abdominal pain, nausea and vomiting have significantly improved.She again started having bowel movement, presence of stool in the ileostomy bag.  Abdomen is not  distendeded today. Has good bowel sounds. NG tube in place. Plan for ileostomy takedown on 01/09/2023 with Dr. Dema Severin. Also started on TPN  Crohn's disease: History of Crohn's disease with right hemicolectomy with end ileostomy because of perforated ileocolic Crohn's with mesenteric abscess.  Currently on  monthly infusion of Remicade.  GI consulted here on last admission, her condition was thought not to be secondary to Crohn's disease       Nutrition Problem: Inadequate oral intake Etiology:  (recurrent SBOs)    DVT prophylaxis:enoxaparin (LOVENOX) injection 40 mg Start: 01/07/23 1100 SCDs Start: 01/06/23 0125     Code Status: Full Code  Family Communication: None at bed side  Patient status:Inpatient  Patient is from :Home  Anticipated discharge [id number]  Estimated DC date:after general surgery clearance   Consultants: General surgery  Procedures: NG tube placement  Antimicrobials:  Anti-infectives (From admission, onward)    None       Subjective: Patient seen and examined at bedside today.  Hemodynamically stable.  Comfortable.  Has some abdominal discomfort but not that  severe . no nausea or vomiting.  Objective: Vitals:   01/07/23 1056 01/07/23 1457 01/07/23 2002 01/08/23 0600  BP:  127/74 116/62 (!) 110/58  Pulse:  69 65 76  Resp:   16 18  Temp:  98.2 F (36.8 C) 98.1 F (36.7 C) 98.3 F (36.8 C)  TempSrc:  Oral Oral Oral  SpO2:  99% 100% 99%  Weight: 68.2 kg     Height:        Intake/Output Summary (Last 24 hours) at 01/08/2023 1037 Last data filed at 01/08/2023 [id number] Gross per 24 hour  Intake 421.69 ml  Output 1700 ml  Net -1278.31 ml   Filed Weights   01/05/23 2126 01/07/23 1056  Weight: 68.5 kg 68.2 kg    Examination:   General exam: Overall comfortable, not in distress HEENT: PERRL,ng tube Respiratory system:  no wheezes or  crackles  Cardiovascular system: S1 & S2 heard, RRR.  Gastrointestinal system: Abdomen is nondistended, soft and nontender.  Bowel sounds present.  Presence of stool in the ileostomy bag Central nervous system: Alert and oriented Extremities: No edema, no clubbing ,no cyanosis Skin: No rashes, no ulcers,no icterus     Data Reviewed: I have personally reviewed following labs and imaging  studies  CBC: Recent Labs  Lab 01/01/23 1116 01/02/23 0600 01/05/23 2155 01/06/23 0523  WBC 15.8* 11.8* 10.8* 7.7  NEUTROABS 14.0*  --  7.8* 4.2  HGB 15.7* 14.2 13.6 12.0  HCT 45.3 41.9 40.7 35.7*  MCV 85.2 87.1 87.2 87.7  PLT 279 261 253 [id number]   Basic Metabolic Panel: Recent Labs  Lab 01/03/23 0540 01/05/23 2155 01/06/23 0523 01/07/23 0359 01/08/23 0533  NA 137 138 132* 137 136  K 3.8 3.4* 3.6 3.4* 3.6  CL 103 99 103 101 101  CO2 '27 29 24 28 27  '$ GLUCOSE 95 114* 101* 81 120*  BUN '18 7 7 '$ 5* <5*  CREATININE 0.81 0.82 0.69 0.57 0.68  CALCIUM 8.3* 9.7 8.3* 8.7* 8.7*  MG  --   --  2.1  --  2.7*  PHOS  --   --   --   --  4.4     No results found for this or any previous visit (from the past 240 hour(s)).   Radiology Studies: [place] EKG SITE RITE  Result Date: 01/07/2023 If Site Rite image not attached, placement could not be confirmed due to current cardiac rhythm.   Scheduled Meds:  Chlorhexidine Gluconate Cloth  6 each Topical Daily   enoxaparin (LOVENOX) injection  40 mg Subcutaneous Daily   feeding supplement  237 mL Per Tube TID   insulin aspart  0-9 Units Subcutaneous Q6H   thiamine (VITAMIN B1) injection  100 mg Intravenous Daily   Continuous Infusions:  [START ON 01/09/2023] lactated ringers     TPN ADULT (ION) 40 mL/hr at 01/07/23 1848   TPN ADULT (ION)       LOS: 2 days   Shelly Coss, MD [facility] P3/09/2023, 10:37 AM

## 2023-01-08 NOTE — Progress Notes (Addendum)
PHARMACY - TOTAL PARENTERAL NUTRITION CONSULT NOTE   Indication: Small bowel obstruction  Patient Measurements: Height: '5\' 6"'$  (167.6 cm) Weight: 68.2 kg (150 lb 5.7 oz) IBW/kg (Calculated) : 59.3 TPN AdjBW (KG): 68.5 Body mass index is 24.27 kg/m. 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 Dema Severin for perforating ileocolic Crohn's disease.  Patient returned with SBO.   Scheduled for ileostomy takedown 01/09/23 with Dr. Dema Severin - given poor intake recently and potential for ileus post op will start TPN today   Glucose / Insulin: CBGs since TPN started 3/11 - 121, 118, no SSI given Electrolytes: all WNL except mag slightly elevated at 2.7 Renal: stable Hepatic: LFTs WNL; T. Bili WNL Intake / Output; MIVF: IVF's d/c'd by Md. I/O net -858 mL, 347m stool output from osteomy GI Surgeries / Procedures:  01/09/23: Scheduled for ileostomy takedown    Central access: 01/07/23 TPN start date: 01/07/23  Nutritional Goals: Goal TPN rate is 85 mL/hr (provides 94 g of protein and 1889 kcals per day)  RD Assessment:pending Estimated Needs Total Energy Estimated Needs: 1850-2050 Total Protein Estimated Needs: 85-95g Total Fluid Estimated Needs: 2L/day   Current Nutrition:  NPO  Plan:  At 1800, increase TPN to rate 60 ml/hr Electrolytes in TPN: Na 567m/L, K 5049mL, Ca 5mE96m, Mg 2mEq16m(decrease), and Phos 15mmo11m Cl:Ac 1:1 Add standard MVI and trace elements to TPN Thiamine '100mg'$  per day x 5 days Continue Sensitive q6h SSI and adjust as needed  MIVF per Md Monitor TPN labs on Mon/Thurs, and PRN  Obinna Ehresman,Kara Mead2024,7:34 AM

## 2023-01-09 ENCOUNTER — Inpatient Hospital Stay (HOSPITAL_COMMUNITY): Payer: Medicaid Other | Admitting: Anesthesiology

## 2023-01-09 ENCOUNTER — Other Ambulatory Visit: Payer: Self-pay

## 2023-01-09 ENCOUNTER — Encounter (HOSPITAL_COMMUNITY): Payer: Self-pay | Admitting: Internal Medicine

## 2023-01-09 ENCOUNTER — Inpatient Hospital Stay (HOSPITAL_COMMUNITY): Admission: RE | Admit: 2023-01-09 | Payer: Medicaid Other | Source: Ambulatory Visit | Admitting: Surgery

## 2023-01-09 ENCOUNTER — Encounter (HOSPITAL_COMMUNITY)
Admission: EM | Disposition: A | Discharge status: Home / Self Care | Payer: Self-pay | Source: Home / Self Care | Attending: Surgery

## 2023-01-09 DIAGNOSIS — K5090 Crohn's disease, unspecified, without complications: Secondary | ICD-10-CM | POA: Diagnosis not present

## 2023-01-09 DIAGNOSIS — K56609 Unspecified intestinal obstruction, unspecified as to partial versus complete obstruction: Secondary | ICD-10-CM | POA: Diagnosis not present

## 2023-01-09 DIAGNOSIS — I10 Essential (primary) hypertension: Secondary | ICD-10-CM

## 2023-01-09 DIAGNOSIS — Z87891 Personal history of nicotine dependence: Secondary | ICD-10-CM

## 2023-01-09 DIAGNOSIS — Z432 Encounter for attention to ileostomy: Secondary | ICD-10-CM

## 2023-01-09 DIAGNOSIS — Z9889 Other specified postprocedural states: Secondary | ICD-10-CM | POA: Diagnosis present

## 2023-01-09 HISTORY — PX: LYSIS OF ADHESION: SHX5961

## 2023-01-09 HISTORY — PX: ILEOSTOMY CLOSURE: SHX1784

## 2023-01-09 LAB — TYPE AND SCREEN
ABO/RH(D): O POS
Antibody Screen: NEGATIVE

## 2023-01-09 LAB — PHOSPHORUS: Phosphorus: 4.5 mg/dL (ref 2.5–4.6)

## 2023-01-09 LAB — GLUCOSE, CAPILLARY
Glucose-Capillary: 139 mg/dL — ABNORMAL HIGH (ref 70–99)
Glucose-Capillary: 149 mg/dL — ABNORMAL HIGH (ref 70–99)
Glucose-Capillary: 159 mg/dL — ABNORMAL HIGH (ref 70–99)
Glucose-Capillary: 183 mg/dL — ABNORMAL HIGH (ref 70–99)

## 2023-01-09 LAB — MAGNESIUM: Magnesium: 2.2 mg/dL (ref 1.7–2.4)

## 2023-01-09 LAB — BASIC METABOLIC PANEL
Anion gap: 7 (ref 5–15)
BUN: 8 mg/dL (ref 6–20)
CO2: 27 mmol/L (ref 22–32)
Calcium: 9.2 mg/dL (ref 8.9–10.3)
Chloride: 100 mmol/L (ref 98–111)
Creatinine, Ser: 0.48 mg/dL (ref 0.44–1.00)
GFR, Estimated: >60 mL/min (ref >60)
Glucose, Bld: 126 mg/dL — ABNORMAL HIGH (ref 70–99)
Potassium: 3.5 mmol/L (ref 3.5–5.1)
Sodium: 134 mmol/L — ABNORMAL LOW (ref 135–145)

## 2023-01-09 SURGERY — CLOSURE, ILEOSTOMY
Anesthesia: General

## 2023-01-09 MED ORDER — HYDROMORPHONE HCL 1 MG/ML IJ SOLN
INTRAMUSCULAR | Status: DC | PRN
  Administered 2023-01-09 (×2): .5 mg via INTRAVENOUS

## 2023-01-09 MED ORDER — MIDAZOLAM HCL 5 MG/5ML IJ SOLN
INTRAMUSCULAR | Status: DC | PRN
  Administered 2023-01-09: 2 mg via INTRAVENOUS

## 2023-01-09 MED ORDER — BUPIVACAINE LIPOSOME 1.3 % IJ SUSP
INTRAMUSCULAR | Status: DC | PRN
  Administered 2023-01-09: 20 mL

## 2023-01-09 MED ORDER — ENSURE SURGERY PO LIQD
237.0000 mL | Freq: Two times a day (BID) | ORAL | Status: DC
  Administered 2023-01-10 – 2023-01-13 (×6): 237 mL via ORAL

## 2023-01-09 MED ORDER — SUCCINYLCHOLINE CHLORIDE 200 MG/10ML IV SOSY
PREFILLED_SYRINGE | INTRAVENOUS | Status: DC | PRN
  Administered 2023-01-09: 200 mg via INTRAVENOUS

## 2023-01-09 MED ORDER — CHLORHEXIDINE GLUCONATE 0.12 % MT SOLN
15.0000 mL | Freq: Once | OROMUCOSAL | Status: AC
  Administered 2023-01-09: 15 mL via OROMUCOSAL

## 2023-01-09 MED ORDER — FENTANYL CITRATE (PF) 100 MCG/2ML IJ SOLN
INTRAMUSCULAR | Status: AC
  Filled 2023-01-09: qty 2

## 2023-01-09 MED ORDER — TRAVASOL 10 % IV SOLN
INTRAVENOUS | Status: AC
  Filled 2023-01-09: qty 938.4

## 2023-01-09 MED ORDER — 0.9 % SODIUM CHLORIDE (POUR BTL) OPTIME
TOPICAL | Status: DC | PRN
  Administered 2023-01-09: 1000 mL

## 2023-01-09 MED ORDER — ONDANSETRON HCL 4 MG/2ML IJ SOLN
INTRAMUSCULAR | Status: AC
  Filled 2023-01-09: qty 2

## 2023-01-09 MED ORDER — LACTATED RINGERS IV SOLN: INTRAVENOUS | Status: DC

## 2023-01-09 MED ORDER — ACETAMINOPHEN 500 MG PO TABS
1000.0000 mg | ORAL_TABLET | Freq: Four times a day (QID) | ORAL | Status: DC
  Administered 2023-01-09 – 2023-01-15 (×23): 1000 mg via ORAL
  Filled 2023-01-09 (×23): qty 2

## 2023-01-09 MED ORDER — SODIUM CHLORIDE 0.9 % IV SOLN
2.0000 g | INTRAVENOUS | Status: AC
  Administered 2023-01-09: 200 g via INTRAVENOUS
  Administered 2023-01-09: 2 g via INTRAVENOUS
  Filled 2023-01-09: qty 2

## 2023-01-09 MED ORDER — ALVIMOPAN 12 MG PO CAPS
12.0000 mg | ORAL_CAPSULE | ORAL | Status: AC
  Administered 2023-01-09: 12 mg via ORAL
  Filled 2023-01-09: qty 1

## 2023-01-09 MED ORDER — LIDOCAINE HCL (PF) 2 % IJ SOLN
INTRAMUSCULAR | Status: AC
  Filled 2023-01-09: qty 5

## 2023-01-09 MED ORDER — ACETAMINOPHEN 500 MG PO TABS
1000.0000 mg | ORAL_TABLET | ORAL | Status: AC
  Administered 2023-01-09: 1000 mg via ORAL
  Filled 2023-01-09: qty 2

## 2023-01-09 MED ORDER — CHLORHEXIDINE GLUCONATE CLOTH 2 % EX PADS: 6.0000 | MEDICATED_PAD | Freq: Once | CUTANEOUS | Status: DC

## 2023-01-09 MED ORDER — ROCURONIUM BROMIDE 10 MG/ML (PF) SYRINGE
PREFILLED_SYRINGE | INTRAVENOUS | Status: AC
  Filled 2023-01-09: qty 10

## 2023-01-09 MED ORDER — HYDRALAZINE HCL 20 MG/ML IJ SOLN: 10.0000 mg | INTRAMUSCULAR | Status: DC | PRN

## 2023-01-09 MED ORDER — ENSURE PRE-SURGERY PO LIQD: 592.0000 mL | Freq: Once | ORAL | Status: DC

## 2023-01-09 MED ORDER — ENSURE PRE-SURGERY PO LIQD: 296.0000 mL | Freq: Once | ORAL | Status: DC

## 2023-01-09 MED ORDER — IBUPROFEN 400 MG PO TABS: 600.0000 mg | ORAL_TABLET | Freq: Four times a day (QID) | ORAL | Status: DC | PRN

## 2023-01-09 MED ORDER — LIDOCAINE HCL 2 % IJ SOLN
INTRAMUSCULAR | Status: AC
  Filled 2023-01-09: qty 20

## 2023-01-09 MED ORDER — CHLORHEXIDINE GLUCONATE 0.12 % MT SOLN: 15.0000 mL | Freq: Once | OROMUCOSAL | Status: DC

## 2023-01-09 MED ORDER — ALVIMOPAN 12 MG PO CAPS
12.0000 mg | ORAL_CAPSULE | Freq: Two times a day (BID) | ORAL | Status: DC
  Administered 2023-01-10 – 2023-01-13 (×7): 12 mg via ORAL
  Filled 2023-01-09 (×7): qty 1

## 2023-01-09 MED ORDER — ORAL CARE MOUTH RINSE: 15.0000 mL | Freq: Once | OROMUCOSAL | Status: AC

## 2023-01-09 MED ORDER — SUCCINYLCHOLINE CHLORIDE 200 MG/10ML IV SOSY
PREFILLED_SYRINGE | INTRAVENOUS | Status: AC
  Filled 2023-01-09: qty 10

## 2023-01-09 MED ORDER — HEPARIN SODIUM (PORCINE) 5000 UNIT/ML IJ SOLN
5000.0000 [IU] | Freq: Once | INTRAMUSCULAR | Status: AC
  Administered 2023-01-09: 5000 [IU] via SUBCUTANEOUS
  Filled 2023-01-09: qty 1

## 2023-01-09 MED ORDER — ACETAMINOPHEN 160 MG/5ML PO SOLN: 325.0000 mg | ORAL | Status: DC | PRN

## 2023-01-09 MED ORDER — PROPOFOL 10 MG/ML IV BOLUS
INTRAVENOUS | Status: DC | PRN
  Administered 2023-01-09: 120 mg via INTRAVENOUS

## 2023-01-09 MED ORDER — DEXAMETHASONE SODIUM PHOSPHATE 10 MG/ML IJ SOLN
INTRAMUSCULAR | Status: AC
  Filled 2023-01-09: qty 1

## 2023-01-09 MED ORDER — FENTANYL CITRATE (PF) 100 MCG/2ML IJ SOLN
INTRAMUSCULAR | Status: DC | PRN
  Administered 2023-01-09 (×4): 50 ug via INTRAVENOUS

## 2023-01-09 MED ORDER — ALUM & MAG HYDROXIDE-SIMETH 200-200-20 MG/5ML PO SUSP: 30.0000 mL | Freq: Four times a day (QID) | ORAL | Status: DC | PRN

## 2023-01-09 MED ORDER — ONDANSETRON HCL 4 MG/2ML IJ SOLN
INTRAMUSCULAR | Status: DC | PRN
  Administered 2023-01-09: 4 mg via INTRAVENOUS

## 2023-01-09 MED ORDER — MIDAZOLAM HCL 2 MG/2ML IJ SOLN
INTRAMUSCULAR | Status: AC
  Filled 2023-01-09: qty 2

## 2023-01-09 MED ORDER — LIDOCAINE HCL (PF) 2 % IJ SOLN
INTRAMUSCULAR | Status: DC | PRN
  Administered 2023-01-09: 1.5 mg/kg/h via INTRADERMAL

## 2023-01-09 MED ORDER — BUPIVACAINE-EPINEPHRINE (PF) 0.25% -1:200000 IJ SOLN
INTRAMUSCULAR | Status: AC
  Filled 2023-01-09: qty 30

## 2023-01-09 MED ORDER — PHENYLEPHRINE 80 MCG/ML (10ML) SYRINGE FOR IV PUSH (FOR BLOOD PRESSURE SUPPORT)
PREFILLED_SYRINGE | INTRAVENOUS | Status: DC | PRN
  Administered 2023-01-09 (×2): 160 ug via INTRAVENOUS

## 2023-01-09 MED ORDER — FENTANYL CITRATE PF 50 MCG/ML IJ SOSY
25.0000 ug | PREFILLED_SYRINGE | INTRAMUSCULAR | Status: DC | PRN
  Administered 2023-01-09: 50 ug via INTRAVENOUS

## 2023-01-09 MED ORDER — DIPHENHYDRAMINE HCL 50 MG/ML IJ SOLN: 12.5000 mg | Freq: Four times a day (QID) | INTRAMUSCULAR | Status: DC | PRN

## 2023-01-09 MED ORDER — POTASSIUM CHLORIDE 10 MEQ/50ML IV SOLN
10.0000 meq | INTRAVENOUS | Status: AC
  Administered 2023-01-09 (×2): 10 meq via INTRAVENOUS
  Filled 2023-01-09 (×2): qty 50

## 2023-01-09 MED ORDER — ACETAMINOPHEN 325 MG PO TABS: 325.0000 mg | ORAL_TABLET | ORAL | Status: DC | PRN

## 2023-01-09 MED ORDER — SIMETHICONE 80 MG PO CHEW
40.0000 mg | CHEWABLE_TABLET | Freq: Four times a day (QID) | ORAL | Status: DC | PRN
  Administered 2023-01-10: 40 mg via ORAL
  Filled 2023-01-09: qty 1

## 2023-01-09 MED ORDER — ROCURONIUM BROMIDE 10 MG/ML (PF) SYRINGE
PREFILLED_SYRINGE | INTRAVENOUS | Status: DC | PRN
  Administered 2023-01-09: 60 mg via INTRAVENOUS

## 2023-01-09 MED ORDER — BUPIVACAINE LIPOSOME 1.3 % IJ SUSP: 20.0000 mL | Freq: Once | INTRAMUSCULAR | Status: DC

## 2023-01-09 MED ORDER — PROPOFOL 10 MG/ML IV BOLUS
INTRAVENOUS | Status: AC
  Filled 2023-01-09: qty 20

## 2023-01-09 MED ORDER — OXYCODONE HCL 5 MG/5ML PO SOLN: 5.0000 mg | Freq: Once | ORAL | Status: DC | PRN

## 2023-01-09 MED ORDER — FENTANYL CITRATE PF 50 MCG/ML IJ SOSY
PREFILLED_SYRINGE | INTRAMUSCULAR | Status: AC
  Filled 2023-01-09: qty 2

## 2023-01-09 MED ORDER — DIPHENHYDRAMINE HCL 12.5 MG/5ML PO ELIX: 12.5000 mg | ORAL_SOLUTION | Freq: Four times a day (QID) | ORAL | Status: DC | PRN

## 2023-01-09 MED ORDER — MEPERIDINE HCL 50 MG/ML IJ SOLN: 6.2500 mg | INTRAMUSCULAR | Status: DC | PRN

## 2023-01-09 MED ORDER — HYDROMORPHONE HCL 2 MG/ML IJ SOLN
INTRAMUSCULAR | Status: AC
  Filled 2023-01-09: qty 1

## 2023-01-09 MED ORDER — TRAMADOL HCL 50 MG PO TABS
50.0000 mg | ORAL_TABLET | Freq: Four times a day (QID) | ORAL | Status: DC | PRN
  Administered 2023-01-09 – 2023-01-12 (×6): 50 mg via ORAL
  Filled 2023-01-09 (×6): qty 1

## 2023-01-09 MED ORDER — BUPIVACAINE LIPOSOME 1.3 % IJ SUSP
INTRAMUSCULAR | Status: AC
  Filled 2023-01-09: qty 20

## 2023-01-09 MED ORDER — DEXAMETHASONE SODIUM PHOSPHATE 10 MG/ML IJ SOLN
INTRAMUSCULAR | Status: DC | PRN
  Administered 2023-01-09: 10 mg via INTRAVENOUS

## 2023-01-09 MED ORDER — KETAMINE HCL 10 MG/ML IJ SOLN
INTRAMUSCULAR | Status: DC | PRN
  Administered 2023-01-09: 30 mg via INTRAVENOUS
  Administered 2023-01-09: 20 mg via INTRAVENOUS

## 2023-01-09 MED ORDER — BUPIVACAINE-EPINEPHRINE (PF) 0.25% -1:200000 IJ SOLN
INTRAMUSCULAR | Status: DC | PRN
  Administered 2023-01-09: 30 mL

## 2023-01-09 MED ORDER — LIDOCAINE 2% (20 MG/ML) 5 ML SYRINGE
INTRAMUSCULAR | Status: DC | PRN
  Administered 2023-01-09: 100 mg via INTRAVENOUS

## 2023-01-09 MED ORDER — OXYCODONE HCL 5 MG PO TABS: 5.0000 mg | ORAL_TABLET | Freq: Once | ORAL | Status: DC | PRN

## 2023-01-09 MED ORDER — ONDANSETRON HCL 4 MG/2ML IJ SOLN: 4.0000 mg | Freq: Once | INTRAMUSCULAR | Status: DC | PRN

## 2023-01-09 SURGICAL SUPPLY — 49 items
APL PRP STRL LF DISP 70% ISPRP (MISCELLANEOUS)
BAG COUNTER SPONGE SURGICOUNT (BAG) IMPLANT
BAG SPNG CNTER NS LX DISP (BAG)
BLADE HEX COATED 2.75 (ELECTRODE) ×1 IMPLANT
CHLORAPREP W/TINT 26 (MISCELLANEOUS) ×1 IMPLANT
COVER MAYO STAND STRL (DRAPES) ×1 IMPLANT
DRAPE LAPAROSCOPIC ABDOMINAL (DRAPES) ×1 IMPLANT
DRAPE UTILITY XL STRL (DRAPES) IMPLANT
DRAPE WARM FLUID 44X44 (DRAPES) ×1 IMPLANT
DRSG OPSITE POSTOP 4X10 (GAUZE/BANDAGES/DRESSINGS) IMPLANT
DRSG OPSITE POSTOP 4X6 (GAUZE/BANDAGES/DRESSINGS) IMPLANT
DRSG OPSITE POSTOP 4X8 (GAUZE/BANDAGES/DRESSINGS) IMPLANT
DRSG TELFA 3X8 NADH STRL (GAUZE/BANDAGES/DRESSINGS) IMPLANT
ELECT REM PT RETURN 15FT ADLT (MISCELLANEOUS) ×1 IMPLANT
GAUZE SPONGE 4X4 12PLY STRL (GAUZE/BANDAGES/DRESSINGS) ×1 IMPLANT
GLOVE BIOGEL PI IND STRL 7.0 (GLOVE) ×1 IMPLANT
GLOVE SURG SS PI 7.0 STRL IVOR (GLOVE) ×1 IMPLANT
GOWN STRL REUS W/ TWL XL LVL3 (GOWN DISPOSABLE) IMPLANT
GOWN STRL REUS W/TWL XL LVL3 (GOWN DISPOSABLE)
HANDLE SUCTION POOLE (INSTRUMENTS) ×1 IMPLANT
HOLDER FOLEY CATH W/STRAP (MISCELLANEOUS) IMPLANT
KIT BASIN OR (CUSTOM PROCEDURE TRAY) ×1 IMPLANT
KIT TURNOVER KIT A (KITS) IMPLANT
MANIFOLD NEPTUNE II (INSTRUMENTS) ×1 IMPLANT
PACK GENERAL/GYN (CUSTOM PROCEDURE TRAY) ×1 IMPLANT
RELOAD PROXIMATE 75MM BLUE (ENDOMECHANICALS) ×1 IMPLANT
RELOAD STAPLE 75 3.8 BLU REG (ENDOMECHANICALS) IMPLANT
STAPLER 90 3.5 STAND SLIM (STAPLE) ×1
STAPLER 90 3.5 STD SLIM (STAPLE) IMPLANT
STAPLER GUN LINEAR PROX 60 (STAPLE) IMPLANT
STAPLER PROXIMATE 75MM BLUE (STAPLE) IMPLANT
SUCTION POOLE HANDLE (INSTRUMENTS) ×1
SUT NOVA NAB DX-16 0-1 5-0 T12 (SUTURE) IMPLANT
SUT NOVA NAB GS-21 0 18 T12 DT (SUTURE) ×2 IMPLANT
SUT PDS AB 1 CT1 27 (SUTURE) IMPLANT
SUT PROLENE 2 0 BLUE (SUTURE) IMPLANT
SUT SILK 2 0 (SUTURE) ×1
SUT SILK 2 0 SH CR/8 (SUTURE) ×1 IMPLANT
SUT SILK 2-0 18XBRD TIE 12 (SUTURE) ×1 IMPLANT
SUT SILK 3 0 (SUTURE) ×1
SUT SILK 3 0 SH CR/8 (SUTURE) ×1 IMPLANT
SUT SILK 3-0 18XBRD TIE 12 (SUTURE) ×1 IMPLANT
SUT VIC AB 2-0 SH 18 (SUTURE) IMPLANT
SUT VIC AB 2-0 SH 27 (SUTURE) ×3
SUT VIC AB 2-0 SH 27X BRD (SUTURE) ×2 IMPLANT
SUT VIC AB 4-0 PS2 18 (SUTURE) ×1 IMPLANT
TOWEL OR 17X26 10 PK STRL BLUE (TOWEL DISPOSABLE) ×2 IMPLANT
TOWEL OR NON WOVEN STRL DISP B (DISPOSABLE) ×2 IMPLANT
YANKAUER SUCT BULB TIP NO VENT (SUCTIONS) ×1 IMPLANT

## 2023-01-09 NOTE — Progress Notes (Signed)
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  She was just discharged on 3/7 when she was admitted for small bowel obstruction, general surgery cleared for discharge at this time.  After she went home, she developed new generalized abdominal discomfort with vomiting.  On presentation, she was hemodynamically stable.  CT abdomen/pelvis showed high-grade small bowel obstruction with transition point at the end ileostomy formation.  General surgery consulted.  NG tube placed.  SBO has seemed to be resolved, she is having output in the ostomy.  General surgery planning for ileostomy takedown on 3/13 by Dr. Dema Severin.  Also started on TPN.  Assessment & Plan:  Principal Problem:   Small bowel obstruction (HCC) Active Problems:   DM2 (diabetes mellitus, type 2) (HCC)   Hypokalemia   Abdominal pain   Nausea & vomiting   Leukocytosis  SBO: Presented again with abdomen pain, nausea and vomiting.  She was discharged from here on 3/7 after she was admitted for SBO.   CT abdomen/pelvis showed high-grade small bowel obstruction with transition point at the end ileostomy formation.  General surgery consulted.  NG tube placed,now clamped. Abdominal pain, nausea and vomiting have significantly improved.She again started having bowel movement, presence of stool in the ileostomy bag.  Abdomen is not  distendeded today. Has good bowel sounds. NG tube in place. Plan for ileostomy takedown on 01/09/2023 with Dr. Dema Severin. Also started on TPN  Crohn's disease: History of Crohn's disease with right hemicolectomy with end ileostomy because of perforated ileocolic Crohn's with mesenteric abscess.  Currently on  monthly infusion of Remicade.  GI consulted here on last admission, her condition was thought not to be secondary to Crohn's disease       Nutrition Problem: Inadequate oral intake Etiology:  (recurrent SBOs)    DVT prophylaxis:SCD's Start: 01/09/23 1045 enoxaparin (LOVENOX) injection 40 mg Start: 01/07/23 1100 SCDs Start: 01/06/23 0125     Code Status: Full Code  Family Communication: None at bed side  Patient status:Inpatient  Patient is from :Home  Anticipated discharge [id number]  Estimated DC date:after general surgery clearance   Procedures: NG tube placement  Antimicrobials:  Anti-infectives (From admission, onward)    Start     Dose/Rate Route Frequency Ordered Stop   01/09/23 1045  cefoTEtan (CEFOTAN) 2 g in sodium chloride 0.9 % 100 mL IVPB        2 g 200 mL/hr over 30 Minutes Intravenous On call to O.R. 01/09/23 1044 01/09/23 1140       Subjective: Patient seen and examined at bedside today.  Hemodynamically stable.  Denies any abdominal pain, nausea or vomiting.  Waiting for surgical procedure today.  Ileostomy had stool.  Objective: Vitals:   01/09/23 0613 01/09/23 0903 01/09/23 1028 01/09/23 1106  BP: (!) 109/57 (!) 99/56 114/74   Pulse: 92 62 84   Resp: '18 17 16   '$ Temp: 98.5 F (36.9 C) 98.3 F (36.8 C)    TempSrc: Oral Oral    SpO2: 97% 98% 98%   Weight:    68 kg  Height:    '5\' 6"'$  (1.676 m)    Intake/Output Summary (Last 24 hours) at 01/09/2023 1250 Last data filed at 01/09/2023 0900 Gross per 24  hour  Intake 1944.87 ml  Output 2200 ml  Net -255.13 ml   Filed Weights   01/05/23 2126 01/07/23 1056 01/09/23 1106  Weight: 68.5 kg 68.2 kg 68 kg    Examination:   General exam: Overall comfortable, not in distress HEENT: PERRL,NG tube Respiratory system:  no wheezes or crackles  Cardiovascular system: S1 & S2 heard, RRR.  Gastrointestinal system: Abdomen is nondistended, soft and nontender.  Ileostomy with stool in the bag Central  nervous system: Alert and oriented Extremities: No edema, no clubbing ,no cyanosis Skin: No rashes, no ulcers,no icterus     Data Reviewed: I have personally reviewed following labs and imaging studies  CBC: Recent Labs  Lab 01/05/23 2155 01/06/23 0523  WBC 10.8* 7.7  NEUTROABS 7.8* 4.2  HGB 13.6 12.0  HCT 40.7 35.7*  MCV 87.2 87.7  PLT 253 Q000111Q   Basic Metabolic Panel: Recent Labs  Lab 01/05/23 2155 01/06/23 0523 01/07/23 0359 01/08/23 0533 01/09/23 0422  NA 138 132* 137 136 134*  K 3.4* 3.6 3.4* 3.6 3.5  CL 99 103 101 101 100  CO2 '29 24 28 27 27  '$ GLUCOSE 114* 101* 81 120* 126*  BUN 7 7 5* <5* 8  CREATININE 0.82 0.69 0.57 0.68 0.48  CALCIUM 9.7 8.3* 8.7* 8.7* 9.2  MG  --  2.1  --  2.7* 2.2  PHOS  --   --   --  4.4 4.5     No results found for this or any previous visit (from the past 240 hour(s)).   Radiology Studies: No results found.  Scheduled Meds:  bupivacaine liposome  20 mL Infiltration Once   chlorhexidine  15 mL Mouth/Throat Once   Chlorhexidine Gluconate Cloth  6 each Topical Once   [MAR Hold] enoxaparin (LOVENOX) injection  40 mg Subcutaneous Daily   [MAR Hold] feeding supplement  237 mL Per Tube TID   [MAR Hold] insulin aspart  0-9 Units Subcutaneous Q6H   Continuous Infusions:  lactated ringers 50 mL/hr at 01/09/23 1241   lactated ringers     lactated ringers 10 mL/hr at 01/09/23 1241   TPN ADULT (ION) 60 mL/hr at 01/08/23 1849   TPN ADULT (ION)       LOS: 3 days   Shelly Coss, MD [facility] P3/13/2024, 12:50 PM

## 2023-01-09 NOTE — Progress Notes (Signed)
Nutrition Follow-up  INTERVENTION:   Monitor magnesium, potassium, and phosphorus BID for at least 3 days, MD to replete as needed, as pt is at risk for refeeding syndrome. -Recommend 100 mg Thiamine daily x 5 days   -TPN management per Pharmacy -Daily weights while on TPN   -Ensure Plus High Protein po BID, each supplement provides 350 kcal and 20 grams of protein while NGT clamped.  NUTRITION DIAGNOSIS:   Inadequate oral intake related to  (recurrent SBOs) as evidenced by NPO status.  Ongoing.  GOAL:   Patient will meet greater than or equal to 90% of their needs  Progressing.  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  Spoke with patient 3/12, pt states she was drinking her Ensure with no issue. Very eager to have surgery done. Currently in OR for ileostomy takedown today.   TPN advancing to goal rate of 85 ml/hr, providing 1896 kcals and 93g protein.   Admission weight: 151 lbs Current weight: 149 lbs  Medications: Lactated ringers, KCl  Labs reviewed: CBGs: 131-159 Low Na  Diet Order:   Diet Order             Diet NPO time specified Except for: Ice Chips, Sips with Meds  Diet effective ____           Diet NPO time specified  Diet effective midnight                   EDUCATION NEEDS:   Education needs have been addressed  Skin:  Skin Assessment: Reviewed RN Assessment  Last BM:  3/13 -ileostomy  Height:   Ht Readings from Last 1 Encounters:  01/09/23 '5\' 6"'$  (1.676 m)    Weight:   Wt Readings from Last 1 Encounters:  01/09/23 68 kg    BMI:  Body mass index is 24.2 kg/m.  Estimated Nutritional Needs:   Kcal:  1850-2050  Protein:  85-95g  Fluid:  2L/day  Clayton Bibles, MS, RD, LDN Inpatient Clinical Dietitian Contact information available via Amion

## 2023-01-09 NOTE — Anesthesia Postprocedure Evaluation (Signed)
Anesthesia Post Note  Patient: Sandra Foster  Procedure(s) Performed: LAPAROSCOPIC ASSISTED ILEOSTOMY TAKEDOWN LYSIS OF ADHESION     Patient location during evaluation: PACU Anesthesia Type: General Level of consciousness: awake and alert Pain management: pain level controlled Vital Signs Assessment: post-procedure vital signs reviewed and stable Respiratory status: spontaneous breathing, nonlabored ventilation, respiratory function stable and patient connected to nasal cannula oxygen Cardiovascular status: blood pressure returned to baseline and stable Postop Assessment: no apparent nausea or vomiting Anesthetic complications: no   No notable events documented.  Last Vitals:  Vitals:   01/09/23 1515 01/09/23 1530  BP: 124/83 126/83  Pulse: 90 93  Resp: 14 14  Temp:    SpO2: 100% 100%    Last Pain:  Vitals:   01/09/23 1540  TempSrc:   PainSc: 8                  Keilin Gamboa

## 2023-01-09 NOTE — Op Note (Signed)
01/09/2023  2:52 PM  PATIENT:  Sandra Foster  32 y.o. female  Patient Care Team: Jorge Ny, PA-C as PCP - General (Physician Assistant) Juanita Craver, MD as Consulting Physician (Gastroenterology) Frederico Hamman, MD as Referring Physician (Obstetrics and Gynecology) Jorge Ny, PA-C (Physician Assistant)  PRE-OPERATIVE DIAGNOSIS:   Ileostomy status Crohn's disease  POST-OPERATIVE DIAGNOSIS:  Same  PROCEDURE:  Laparoscopic-assisted take down of end ileostomy with ileocolic anastomosis  SURGEON:  Sharon Mt. Dema Severin, MD  ASSISTANT: Michael Boston, MD  ANESTHESIA:   local and general  COUNTS:  Sponge, needle and instrument counts were reported correct x2 at the conclusion of the operation.  EBL: 50 mL  DRAINS: None  SPECIMEN: Ileostomy  COMPLICATIONS: None  FINDINGS: Healthy-appearing end ileostomy.  A few adhesions consisting of omentum and 2 loops of small bowel were lysed.  Otherwise, no major adhesions in the abdomen.  Laparoscopic assisted end ileostomy takedown with ileocolic anastomosis.  DISPOSITION: PACU in satisfactory condition  DESCRIPTION: The patient was identified in preop holding and taken to the OR where she was placed on the operating room table. SCDs were placed. General endotracheal anesthesia was induced without difficulty.  She was positioned supine with arms tucked.  Pressure points were evaluated and padded.  A Foley catheter was placed by nursing.  The ileostomy appliance was removed and a loose pursestring stitch placed at the mucocutaneous junction to occlude the ostomy during the surgery.  She was secured to the operating room table.  The abdomen is then prepped and draped in standard sterile fashion. A surgical timeout was performed indicating the correct patient, procedure, positioning and need for preoperative antibiotics.   An NG tube was already in place and was confirmed to be to suction.  At Palmer's point, a stab incision was  created and the Veress needle introduced into the peritoneal cavity on the first attempt.  Intraperitoneal location was confirmed by the aspiration and saline drop test.  Pneumoperitoneum was established to 15 mmHg using CO2.  The abdomen was then nicely distended.  At Palmer's point, a 5 mm optical viewing trocar was placed under direct visualization.  Laparoscope was inserted and demonstrates no evidence of Veress needle or trocar site complications.  The abdomen is then surveyed.  There are some adhesions across the midline consisting of omentum.  There is also some small bowel adhesions on the limb of ileum going up to her abdominal wall.  The colon is healthy in appearance.  Under direct visualization, bilateral transversus abdominis plane blocks are then created using a dilute mixture of 0.25% Marcaine with epinephrine and Exparel.  2 additional 5 mm trocars then placed under direct visualization, 1 in the left lateral abdomen and another in the right lower quadrant.  A 12 mm trocar was placed at our planned location for the extracorporeal portion of the procedure which was just cephalad to the umbilicus.  This was done under direct visualization. Omental containing adhesions were then taken down using laparoscopic scissors.  The small bowel containing adhesions were also lysed sharply without difficulty.  The small intestine is then inspected.  There is no evidence of serosal injury.  We then ran the bowel.  The small bowel is healthy in appearance and there is no significant evidence of active Crohn's disease within her small intestine.  The colon is also visualized and there is no significant transmural type inflammation evident on this either.  Were able to identify the proximal stent of her colon which is near  her hepatic flexure.  This is healthy in appearance.  We began by mobilizing the hepatic flexure.  She was positioned in reverse Trendelenburg.  The lesser sac was entered by retracting the  colon caudad and the omentum anteriorly.  The omentum was then freed from the proximal half of the transverse colon.  Hepatic flexure attachments were all then released.  The duodenum was identified and protected.  The transverse and hepatic flexure mesocolon are mobilized from the right upper quadrant.  In doing so, the hepatic flexure reaches into her left abdomen without any difficulty.  We then turned our attention to the extracorporeal portion of the procedure.  Using the 12 mm trocar site, we enlarged the skin incision for planned anastomosis at this location.  The fascia was incised.  Mount Repose wound protector was placed.  The ileostomy was then circumferentially incised and the subcutaneous tissue divided.  Were able to free the ileostomy from surrounding subcutaneous and rectus fascia tissues.  The ileum is normal in appearance externally.  We able to pass this down through the ostomy site and back out through the wound protector.  The hepatic flexure was also brought through this wound protector.  A window was created the mesentery just proximal to the ileostomy cutaneous junction and the ileum was divided with a GIA 75 mm blue load stapler.  The intervening mesentery was ligated and divided using clamps and 2-0 silk ties.  The ileostomy was passed off the specimen.  Attention was then directed to creating an ileocolic anastomosis.  Orientation is confirmed such there is no twisting in both limbs of mesentery lay in their anatomic configurations.  An enterotomy and colotomy were both created on the respective limbs of bowel.  Along the antimesenteric border of each respective limb, 75 mm GIA blue load stapler was then used to fashion this anastomosis.  The anastomosis inspected and noted to be intact with well-formed staples and hemostatic.  The common enterotomy was then elevated with Allis clamps.  This defect is then obliterated using a TA 90 blue load stapler.  We made sure not to kink off either limb  of bowel in doing this.  The staple ends inspected and there are well-formed staples.  Hemostasis is noted.  The corners of each respective TA staple line limb are then dunked using 2-0 silk suture.  A 2-0 silk suture was also placed securing the apex of the anastomosis.  The anastomosis is palpated and found to be widely patent, 3 fingerbreadths in diameter.  This is then placed back into the abdomen.  The fascia at the ileostomy site is closed transversely using 2 running #1 PDS sutures.  The skin is then cinched down with a pursestring suture using 2-0 Vicryl.  Gown/gloves were changed.  The wound protector was removed.  The midline fascia for extraction site is then closed using 2 running #1 PDS sutures.  All sponge, needle, and instrument counts are reported correct.  The skin of all incision sites is closed using 4-0 Monocryl subcuticular suture.  The abdomen then washed and dried and the wounds were dressed with Dermabond.  The ileostomy site is then washed and dried and loosely packed with a single 4 x 4 gauze.  Additional 4 x 4's and tape were placed over this.  She is then awakened from anesthesia, extubated, transferred to a stretcher for transport to recovery in satisfactory condition.

## 2023-01-09 NOTE — Progress Notes (Signed)
Day of Surgery   Subjective/Chief Complaint: No abdominal pain nor distention. Tolerated her protein shakes yesterday without issue or distention. Ostomy working well.  Objective: Vital signs in last 24 hours: Temp:  [98.3 F (36.8 C)-98.5 F (36.9 C)] 98.3 F (36.8 C) (03/13 0903) Pulse Rate:  [62-92] 84 (03/13 1028) Resp:  [16-18] 16 (03/13 1028) BP: (99-114)/(56-74) 114/74 (03/13 1028) SpO2:  [97 %-100 %] 98 % (03/13 1028) Weight:  [68 kg] 68 kg (03/13 1106) Last BM Date : 01/09/23  Intake/Output from previous day: 03/12 0701 - 03/13 0700 In: 1944.9 [P.O.:480; I.V.:1464.9] Out: 1200 [Stool:1200] Intake/Output this shift: Total I/O In: 0  Out: 1000 [Urine:1000]  PE:  Constitutional: No acute distress CV: RRR Lungs: normal respiratory effort on room air GI: Soft, nontender, nondistended   Lab Results:  No results for input(s): "WBC", "HGB", "HCT", "PLT" in the last 72 hours.  BMET Recent Labs    01/08/23 0533 01/09/23 0422  NA 136 134*  K 3.6 3.5  CL 101 100  CO2 27 27  GLUCOSE 120* 126*  BUN <5* 8  CREATININE 0.68 0.48  CALCIUM 8.7* 9.2   PT/INR No results for input(s): "LABPROT", "INR" in the last 72 hours.  ABG No results for input(s): "PHART", "HCO3" in the last 72 hours.  Invalid input(s): "PCO2", "PO2"  Studies/Results: No results found.     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 Dema Severin for perforating ileocolic Crohn's disease  -We have reviewed the relevant anatomy with her and she remains motivated to undergo stoma reversal surgery -We have discussed surgery, laparoscopic-assisted takedown of end ileostomy  -The planned procedures, material risks (including, but not limited to, pain, bleeding, infection, scarring with expected postop wounds, need for blood transfusion, damage to surrounding structures- blood vessels/nerves/viscus/organs, damage to ureter, leak from anastomosis, need for  additional procedures, worsening of pre-existing medical conditions, hernia, recurrence, pneumonia, heart attack, stroke, death) benefits and alternatives to surgery were discussed at length. I noted a good probability that the procedure would help improve their symptoms. The patient's questions were answered to their satisfaction, they voiced understanding and elected to proceed with surgery. Additionally, we discussed typical postoperative expectations and the recovery process.  FEN - NGT clamping. TID protein shake today; continue TPN. ID - none  VTE - SCDs, lovenox Foley - none  Ileana Roup, MD [facility] Surgery 01/09/2023, 12:01 PM  I spent a total of 35 minutes in both face-to-face and non-face-to-face activities, excluding procedures performed, for this visit on the date of this encounter.    LOS: 3 days

## 2023-01-09 NOTE — Transfer of Care (Signed)
Immediate Anesthesia Transfer of Care Note  Patient: Sandra Foster  Procedure(s) Performed: LAPAROSCOPIC ASSISTED ILEOSTOMY TAKEDOWN LYSIS OF ADHESION  Patient Location: PACU  Anesthesia Type:General  Level of Consciousness: sedated  Airway & Oxygen Therapy: Patient Spontanous Breathing and Patient connected to face mask oxygen  Post-op Assessment: Report given to RN and Post -op Vital signs reviewed and stable  Post vital signs: Reviewed and stable  Last Vitals:  Vitals Value Taken Time  BP 109/61 01/09/23 1456  Temp    Pulse 80 01/09/23 1500  Resp 15 01/09/23 1500  SpO2 100 % 01/09/23 1500  Vitals shown include unvalidated device data.  Last Pain:  Vitals:   01/09/23 1106  TempSrc:   PainSc: 0-No pain      Patients Stated Pain Goal: 2 ([id number])  Complications: No notable events documented.

## 2023-01-09 NOTE — Anesthesia Procedure Notes (Signed)
Procedure Name: Intubation Date/Time: 01/09/2023 12:47 PM  Performed by: Lind Covert, CRNAPre-anesthesia Checklist: Patient identified, Emergency Drugs available, Suction available, Patient being monitored and Timeout performed Patient Re-evaluated:Patient Re-evaluated prior to induction Oxygen Delivery Method: Circle system utilized Preoxygenation: Pre-oxygenation with 100% oxygen Induction Type: IV induction, Rapid sequence and Cricoid Pressure applied Laryngoscope Size: Mac and 4 Grade View: Grade I Tube type: Oral Tube size: 7.0 mm Number of attempts: 1 Airway Equipment and Method: Stylet Placement Confirmation: ETT inserted through vocal cords under direct vision, positive ETCO2 and breath sounds checked- equal and bilateral Secured at: 21 cm Tube secured with: Tape Dental Injury: Teeth and Oropharynx as per pre-operative assessment

## 2023-01-09 NOTE — Anesthesia Preprocedure Evaluation (Signed)
Anesthesia Evaluation  Patient identified by MRN, date of birth, ID band Patient awake    Reviewed: Allergy & Precautions, NPO status , Patient's Chart, lab work & pertinent test results  Airway Mallampati: I  TM Distance: >3 FB Neck ROM: Full    Dental no notable dental hx. (+) Teeth Intact, Dental Advisory Given   Pulmonary neg pulmonary ROS, Patient abstained from smoking., former smoker   Pulmonary exam normal breath sounds clear to auscultation       Cardiovascular hypertension, Pt. on medications Normal cardiovascular exam Rhythm:Regular Rate:Normal     Neuro/Psych negative neurological ROS  negative psych ROS   GI/Hepatic Neg liver ROS,GERD  Medicated and Controlled,,  Endo/Other  diabetes, Type 2    Renal/GU Renal disease  negative genitourinary   Musculoskeletal negative musculoskeletal ROS (+)    Abdominal   Peds negative pediatric ROS (+)  Hematology negative hematology ROS (+) Blood dyscrasia, anemia   Anesthesia Other Findings   Reproductive/Obstetrics negative OB ROS                              Anesthesia Physical Anesthesia Plan  ASA: 3  Anesthesia Plan: General   Post-op Pain Management: Minimal or no pain anticipated, Tylenol PO (pre-op)* and Dilaudid IV   Induction: Intravenous  PONV Risk Score and Plan: 3 and Ondansetron, Dexamethasone and Treatment may vary due to age or medical condition  Airway Management Planned: Oral ETT  Additional Equipment: None  Intra-op Plan:   Post-operative Plan: Extubation in OR  Informed Consent:   Plan Discussed with: Anesthesiologist  Anesthesia Plan Comments: (HPI: Sandra Foster is a 32 y.o. female with medical history significant of IDDM, Crohn's disease.  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  This has progressively worsened and she has required 4 prior  admits (5 including today) since June 2023.  She has developed abscess as well as inflammation causing extrensic compression of ureters and B hydronephrosis.)         Anesthesia Quick Evaluation

## 2023-01-09 NOTE — Progress Notes (Signed)
PHARMACY - TOTAL PARENTERAL NUTRITION CONSULT NOTE   Indication: Small bowel obstruction  Patient Measurements: Height: '5\' 6"'$  (167.6 cm) Weight: 68.2 kg (150 lb 5.7 oz) IBW/kg (Calculated) : 59.3 TPN AdjBW (KG): 68.5 Body mass index is 24.27 kg/m. 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 Dema Severin for perforating ileocolic Crohn's disease.  Patient returned with SBO.   Scheduled for ileostomy takedown 01/09/23 with Dr. Dema Severin. Pharmacy was consulted to initiate TPN on 3/11/  Glucose / Insulin: Goal 100-180. PMH signiciant for T2DM, but not on any medications PTA -CBG range: 126-159. 5 units SSI/24 hrs.  Electrolytes: Na (134) slightly low, K (3.5) on lower end normal. All other lytes WNL including Ca. Renal: Stable, WNL Hepatic: LFTs WNL; T. Bili WNL Intake / Output; MIVF: LR @ 50 mL/hr -UOP: None charted yesterday - suspect documentation error. Stool: 1200 mL GI Surgeries / Procedures:  01/09/23: Scheduled for ileostomy takedown    Central access: Double lumen PICC placed 01/07/23 TPN start date: 01/07/23  Nutritional Goals: Goal TPN rate is 85 mL/hr (provides 93 g of protein and 1896 kcals per day)  RD Assessment:pending Estimated Needs Total Energy Estimated Needs: 1850-2050 Total Protein Estimated Needs: 85-95g Total Fluid Estimated Needs: 2L/day   Current Nutrition:  NPO TPN  Plan:  Now: KCl 10 mEq/50 mL x2   At 1800: Increase TPN to goal rate of 85 mL/hr Electrolytes in TPN: Increase Na Na 100 mEq/L, K 50 mEq/L, Ca 5 mEq/L, Mg 2 mEq/L, and Phos 15 mmol/L.  Cl:Ac 1:1 Add standard MVI and trace elements to TPN Thiamine 100 mg per day x 5 days Continue Sensitive q6h SSI and adjust as needed  MIVF per MD Monitor TPN labs on Mon/Thurs. Recheck electrolytes with AM labs tomorrow.   Lenis Noon, PharmD 01/09/2023,7:31 AM

## 2023-01-10 ENCOUNTER — Encounter (HOSPITAL_COMMUNITY): Payer: Self-pay | Admitting: Surgery

## 2023-01-10 DIAGNOSIS — K56609 Unspecified intestinal obstruction, unspecified as to partial versus complete obstruction: Secondary | ICD-10-CM | POA: Diagnosis not present

## 2023-01-10 LAB — CBC
HCT: 37.2 % (ref 36.0–46.0)
Hemoglobin: 12.5 g/dL (ref 12.0–15.0)
MCH: 29.6 pg (ref 26.0–34.0)
MCHC: 33.6 g/dL (ref 30.0–36.0)
MCV: 87.9 fL (ref 80.0–100.0)
Platelets: 254 10*3/uL (ref 150–400)
RBC: 4.23 MIL/uL (ref 3.87–5.11)
RDW: 13.9 % (ref 11.5–15.5)
WBC: 15.8 10*3/uL — ABNORMAL HIGH (ref 4.0–10.5)
nRBC: 0 % (ref 0.0–0.2)

## 2023-01-10 LAB — COMPREHENSIVE METABOLIC PANEL
ALT: 32 U/L (ref 0–44)
AST: 22 U/L (ref 15–41)
Albumin: 3.9 g/dL (ref 3.5–5.0)
Alkaline Phosphatase: 34 U/L — ABNORMAL LOW (ref 38–126)
Anion gap: 9 (ref 5–15)
BUN: 12 mg/dL (ref 6–20)
CO2: 24 mmol/L (ref 22–32)
Calcium: 9 mg/dL (ref 8.9–10.3)
Chloride: 99 mmol/L (ref 98–111)
Creatinine, Ser: 0.58 mg/dL (ref 0.44–1.00)
GFR, Estimated: >60 mL/min (ref >60)
Glucose, Bld: 215 mg/dL — ABNORMAL HIGH (ref 70–99)
Potassium: 4.2 mmol/L (ref 3.5–5.1)
Sodium: 132 mmol/L — ABNORMAL LOW (ref 135–145)
Total Bilirubin: 0.7 mg/dL (ref 0.3–1.2)
Total Protein: 6.5 g/dL (ref 6.5–8.1)

## 2023-01-10 LAB — GLUCOSE, CAPILLARY
Glucose-Capillary: 176 mg/dL — ABNORMAL HIGH (ref 70–99)
Glucose-Capillary: 196 mg/dL — ABNORMAL HIGH (ref 70–99)
Glucose-Capillary: 203 mg/dL — ABNORMAL HIGH (ref 70–99)
Glucose-Capillary: 222 mg/dL — ABNORMAL HIGH (ref 70–99)

## 2023-01-10 LAB — TRIGLYCERIDES: Triglycerides: 50 mg/dL (ref <150)

## 2023-01-10 LAB — PHOSPHORUS: Phosphorus: 3.1 mg/dL (ref 2.5–4.6)

## 2023-01-10 LAB — MAGNESIUM: Magnesium: 2 mg/dL (ref 1.7–2.4)

## 2023-01-10 LAB — SURGICAL PATHOLOGY

## 2023-01-10 MED ORDER — ACETAMINOPHEN 325 MG PO TABS: 650.0000 mg | ORAL_TABLET | Freq: Four times a day (QID) | ORAL | Status: DC | PRN

## 2023-01-10 MED ORDER — CHLORHEXIDINE GLUCONATE CLOTH 2 % EX PADS
6.0000 | MEDICATED_PAD | Freq: Every day | CUTANEOUS | Status: DC
  Administered 2023-01-11 – 2023-01-14 (×4): 6 via TOPICAL

## 2023-01-10 MED ORDER — INSULIN ASPART 100 UNIT/ML IJ SOLN
0.0000 [IU] | Freq: Four times a day (QID) | INTRAMUSCULAR | Status: DC
  Administered 2023-01-10 (×2): 3 [IU] via SUBCUTANEOUS
  Administered 2023-01-11: 5 [IU] via SUBCUTANEOUS
  Administered 2023-01-11: 3 [IU] via SUBCUTANEOUS
  Administered 2023-01-11: 5 [IU] via SUBCUTANEOUS

## 2023-01-10 MED ORDER — ACETAMINOPHEN 650 MG RE SUPP: 650.0000 mg | Freq: Four times a day (QID) | RECTAL | Status: DC | PRN

## 2023-01-10 MED ORDER — INSULIN ASPART 100 UNIT/ML IJ SOLN
3.0000 [IU] | Freq: Once | INTRAMUSCULAR | Status: AC
  Administered 2023-01-10: 3 [IU] via SUBCUTANEOUS

## 2023-01-10 MED ORDER — TRAVASOL 10 % IV SOLN
INTRAVENOUS | Status: AC
  Filled 2023-01-10: qty 938.4

## 2023-01-10 NOTE — Progress Notes (Signed)
  Subjective No acute events. Doing great. Feeling well. No complaints at present. No n/v. No flatus/BM yet. Tolerating liquids well  Objective: Vital signs in last 24 hours: Temp:  [97.6 F (36.4 C)-98.6 F (37 C)] 98.1 F (36.7 C) (03/14 0456) Pulse Rate:  [62-102] 77 (03/14 0456) Resp:  [13-20] 18 (03/14 0456) BP: (99-128)/(56-84) 121/79 (03/14 0456) SpO2:  [95 %-100 %] 100 % (03/14 0456) Weight:  [68 kg] 68 kg (03/13 1106) Last BM Date : 01/09/23  Intake/Output from previous day: 03/13 0701 - 03/14 0700 In: 3776.8 [P.O.:600; I.V.:3176.8] Out: 4350 [Urine:4300; Blood:50] Intake/Output this shift: No intake/output data recorded.  Gen: NAD, comfortable CV: RRR Pulm: Normal work of breathing Abd: Soft, NT/ND. Ostomy site packed, clean/dry. Ext: SCDs in place  Lab Results: CBC  Recent Labs    01/10/23 0158  WBC 15.8*  HGB 12.5  HCT 37.2  PLT 254   BMET Recent Labs    01/09/23 0422 01/10/23 0158  NA 134* 132*  K 3.5 4.2  CL 100 99  CO2 27 24  GLUCOSE 126* 215*  BUN 8 12  CREATININE 0.48 0.58  CALCIUM 9.2 9.0   PT/INR No results for input(s): "LABPROT", "INR" in the last 72 hours. ABG No results for input(s): "PHART", "HCO3" in the last 72 hours.  Invalid input(s): "PCO2", "PO2"  Studies/Results:  Anti-infectives: Anti-infectives (From admission, onward)    Start     Dose/Rate Route Frequency Ordered Stop   01/09/23 1045  cefoTEtan (CEFOTAN) 2 g in sodium chloride 0.9 % 100 mL IVPB        2 g 200 mL/hr over 30 Minutes Intravenous On call to O.R. 01/09/23 1044 01/09/23 1443        Assessment/Plan: Patient Active Problem List   Diagnosis Date Noted   S/P closure of ileostomy 01/09/2023   Small bowel obstruction ([place]) 01/06/2023   Abdominal pain 01/06/2023   Nausea & vomiting 01/06/2023   Leukocytosis 01/06/2023   Ileostomy care ([place])    Intra-abdominal abscess ([place]) 06/22/2022   Need for pneumocystis prophylaxis 06/22/2022   Malnutrition  of moderate degree 06/13/2022   Right leg pain 06/12/2022   SBO (small bowel obstruction) ([place]) 06/12/2022   Hydroureteronephrosis 06/12/2022   Thrombocytosis 06/12/2022   Sepsis ([place]) 06/01/2022   Abnormal weight loss 05/28/2022   Gastroesophageal reflux disease 05/28/2022   Crohn's disease ([place]) 05/03/2022   Iron deficiency anemia 04/24/2022   Normocytic anemia 04/23/2022   Hypotension 04/23/2022   Controlled type 2 diabetes mellitus without complication, without long-term current use of insulin ([place]) 04/23/2022   Crohn's disease of ileum, with abscess ([place]) 04/22/2022   Tobacco abuse 04/22/2022   Dehydration 01/09/2022   Fistula of intestine, 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   Abdominal pain, acute, right lower quadrant 01/08/2022   Hypokalemia 01/08/2022   Diarrhea 01/08/2022   NVD (normal vaginal delivery) 09/13/2015   Diabetes in undelivered pregnancy 08/30/2015   DM2 (diabetes mellitus, type 2) ([place]) 08/29/2015   [redacted] weeks gestation of pregnancy    Prior pregnancy with fetal demise and current pregnancy in second trimester    Encounter for fetal anatomic survey    s/p Procedure(s): LAPAROSCOPIC ASSISTED ILEOSTOMY TAKEDOWN LYSIS OF ADHESION 01/09/2023  -Doing exceptionally well -Ambulate 5x/day -Advance to soft diet; continue tpn today, if tolerating soft well, will plan to d/c TPN tomorrow -Ostomy site packing out 3/15 -Ppx: Lovenox, SCDs   LOS: 4 days   Nadeen Landau, MD [facility] Surgery, [place]

## 2023-01-10 NOTE — Progress Notes (Signed)
PHARMACY - TOTAL PARENTERAL NUTRITION CONSULT NOTE   Indication: Small bowel obstruction  Patient Measurements: Height: '5\' 6"'$  (167.6 cm) Weight: 68 kg (149 lb 14.6 oz) IBW/kg (Calculated) : 59.3 TPN AdjBW (KG): 68 Body mass index is 24.2 kg/m. 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 Dema Severin for perforating ileocolic Crohn's disease.  Patient returned with SBO.   Scheduled for ileostomy takedown 01/09/23 with Dr. Dema Severin. Pharmacy was consulted to initiate TPN on 3/11.  Glucose / Insulin: Goal 100-180. PMH signiciant for T2DM, but not on any medications PTA -CBG range: 183 - 222. 6 units SSI/24 hrs. Dexamethasone 10 mg IV x1 3/13 Electrolytes: Na (132) remains slightly low despite increase Na concentration. All other lytes WNL, including Ca. Renal: Stable, WNL Hepatic: LFTs, TG, T. Bili WNL Intake / Output; MIVF: LR @ 75 mL/hr -UOP: 4300 mL. Stool: none. Blood: 50 mL. I/O net: -573 mL GI Surgeries / Procedures:  01/09/23: Laparoscopic assisted ileostomy takedown   Central access: Double lumen PICC placed 01/07/23 TPN start date: 01/07/23  Nutritional Goals: Goal TPN rate is 85 mL/hr (provides 93 g of protein and 1896 kcals per day)  RD Assessment:pending Estimated Needs Total Energy Estimated Needs: 1850-2050 Total Protein Estimated Needs: 85-95g Total Fluid Estimated Needs: 2L/day   Current Nutrition:  TPN Full liquid diet  Plan:  Now:  Additional 3 units of novolog x1, increase SSI from sensitive to moderate  At 1800: Continue TPN at goal rate of 85 mL/hr Electrolytes in TPN: Increase Na, Slightly decrease K Na 150 mEq/L, K 40 mEq/L, Ca 5 mEq/L, Mg 2 mEq/L, and Phos 15 mmol/L.  Cl:Ac 1:1 Add standard MVI and trace elements to TPN Thiamine 100 mg per day x 5 days (through 3/15) Continue moderate q6h SSI and adjust as needed  MIVF per MD Monitor TPN labs on Mon/Thurs. Recheck electrolytes with AM labs  tomorrow.   Lenis Noon, PharmD 01/10/2023,7:11 AM

## 2023-01-10 NOTE — Progress Notes (Signed)
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  She was just discharged on 3/7 when she was admitted for small bowel obstruction, general surgery cleared for discharge at this time.  After she went home, she developed new generalized abdominal discomfort with vomiting.  On presentation, she was hemodynamically stable.  CT abdomen/pelvis showed high-grade small bowel obstruction with transition point at the end ileostomy formation.  General surgery consulted.  NG tube placed.  SBO  resolved .S/P  ileostomy takedown on 3/13 by Dr. Dema Severin.  Also started on TPN.  Assessment & Plan:  Principal Problem:   Small bowel obstruction (HCC) Active Problems:   DM2 (diabetes mellitus, type 2) (HCC)   Hypokalemia   Abdominal pain   Nausea & vomiting   Leukocytosis   S/P closure of ileostomy  SBO: Presented again with abdomen pain, nausea and vomiting.  She was discharged from here on 3/7 after she was admitted for SBO.   CT abdomen/pelvis showed high-grade small bowel obstruction with transition point at the end ileostomy formation.  General surgery consulted.  Abdominal pain, nausea and vomiting resolved.She again started having bowel movement, presence of stool in the ileostomy bag.   S/P   ileostomy takedown on 01/09/2023 with Dr. Dema Severin. Also started on TPN.  General surgery planning to stop TPN tomorrow.  Started on soft diet today  Crohn's disease: History of Crohn's disease with right hemicolectomy with end ileostomy because of perforated ileocolic Crohn's with mesenteric abscess.  Currently on monthly infusion of Remicade.  GI consulted here on last admission, her condition was thought  not to be secondary to Crohn's disease   TRH will sign off.  General surgery will be the primary team.Discussed with Dr Dema Severin    Nutrition Problem: Inadequate oral intake Etiology:  (recurrent SBOs)    DVT prophylaxis:SCD's Start: 01/09/23 1635 Place TED hose Start: 01/09/23 1635 SCD's Start: 01/09/23 1045 enoxaparin (LOVENOX) injection 40 mg Start: 01/07/23 1100 SCDs Start: 01/06/23 0125     Code Status: Full Code  Family Communication: None at bed side  Patient status:Inpatient  Patient is from :Home  Anticipated discharge [id number]  Estimated DC date:as per general surgery   Procedures: NG tube placement, ileostomy takedown  Antimicrobials:  Anti-infectives (From admission, onward)    Start     Dose/Rate Route Frequency Ordered Stop   01/09/23 1045  cefoTEtan (CEFOTAN) 2 g in sodium chloride 0.9 % 100 mL IVPB        2 g 200 mL/hr over 30 Minutes Intravenous On call to O.R. 01/09/23 1044 01/09/23 1443       Subjective: Patient seen and examined the bedside today.  Very comfortable. denies any abdominal pain.  But not passing gas today.  Abdomen is not distended,nontender  Objective: Vitals:   01/09/23 2109 01/10/23 0210 01/10/23 0456 01/10/23 0955  BP: 111/63 (!) 107/57 121/79 118/66  Pulse: 71 71 77 68  Resp: '18 18 18 17  '$ Temp: 98 F (36.7 C) 98.6 F (37 C) 98.1 F (36.7 C) (!) 97.4 F (36.3 C)  TempSrc: Oral Oral Oral Oral  SpO2: 100% 95% 100% 100%  Weight:      Height:        Intake/Output Summary (Last  24 hours) at 01/10/2023 1059 Last data filed at 01/10/2023 0956 Gross per 24 hour  Intake 3836.75 ml  Output 4050 ml  Net -213.25 ml   Filed Weights   01/05/23 2126 01/07/23 1056 01/09/23 1106  Weight: 68.5 kg 68.2 kg 68 kg    Examination:   General exam: Overall comfortable, not in distress HEENT: PERRL Respiratory system:  no wheezes or crackles  Cardiovascular system: S1 & S2 heard, RRR.  Gastrointestinal system: Abdomen is  nondistended, soft , bowel sounds not heard, ileostomy Central nervous system: Alert and oriented Extremities: No edema, no clubbing ,no cyanosis Skin: No rashes, no ulcers,no icterus     Data Reviewed: I have personally reviewed following labs and imaging studies  CBC: Recent Labs  Lab 01/05/23 2155 01/06/23 0523 01/10/23 0158  WBC 10.8* 7.7 15.8*  NEUTROABS 7.8* 4.2  --   HGB 13.6 12.0 12.5  HCT 40.7 35.7* 37.2  MCV 87.2 87.7 87.9  PLT 253 227 0000000   Basic Metabolic Panel: Recent Labs  Lab 01/06/23 0523 01/07/23 0359 01/08/23 0533 01/09/23 0422 01/10/23 0158  NA 132* 137 136 134* 132*  K 3.6 3.4* 3.6 3.5 4.2  CL 103 101 101 100 99  CO2 '24 28 27 27 24  '$ GLUCOSE 101* 81 120* 126* 215*  BUN 7 5* <5* 8 12  CREATININE 0.69 0.57 0.68 0.48 0.58  CALCIUM 8.3* 8.7* 8.7* 9.2 9.0  MG 2.1  --  2.7* 2.2 2.0  PHOS  --   --  4.4 4.5 3.1     No results found for this or any previous visit (from the past 240 hour(s)).   Radiology Studies: No results found.  Scheduled Meds:  acetaminophen  1,000 mg Oral Q6H   alvimopan  12 mg Oral BID   chlorhexidine  15 mL Mouth/Throat Once   enoxaparin (LOVENOX) injection  40 mg Subcutaneous Daily   feeding supplement  237 mL Per Tube TID   feeding supplement  237 mL Oral BID BM   insulin aspart  0-15 Units Subcutaneous Q6H   Continuous Infusions:  lactated ringers     lactated ringers 10 mL/hr at 01/10/23 0916   TPN ADULT (ION) 85 mL/hr at 01/09/23 1732   TPN ADULT (ION)       LOS: 4 days   Shelly Coss, MD [facility] P3/14/2024, 10:59 AM

## 2023-01-11 LAB — GLUCOSE, CAPILLARY
Glucose-Capillary: 135 mg/dL — ABNORMAL HIGH (ref 70–99)
Glucose-Capillary: 183 mg/dL — ABNORMAL HIGH (ref 70–99)
Glucose-Capillary: 188 mg/dL — ABNORMAL HIGH (ref 70–99)
Glucose-Capillary: 199 mg/dL — ABNORMAL HIGH (ref 70–99)
Glucose-Capillary: 204 mg/dL — ABNORMAL HIGH (ref 70–99)
Glucose-Capillary: 228 mg/dL — ABNORMAL HIGH (ref 70–99)

## 2023-01-11 LAB — BASIC METABOLIC PANEL
Anion gap: 7 (ref 5–15)
BUN: 10 mg/dL (ref 6–20)
CO2: 24 mmol/L (ref 22–32)
Calcium: 8.5 mg/dL — ABNORMAL LOW (ref 8.9–10.3)
Chloride: 104 mmol/L (ref 98–111)
Creatinine, Ser: 0.58 mg/dL (ref 0.44–1.00)
GFR, Estimated: >60 mL/min (ref >60)
Glucose, Bld: 173 mg/dL — ABNORMAL HIGH (ref 70–99)
Potassium: 3.7 mmol/L (ref 3.5–5.1)
Sodium: 135 mmol/L (ref 135–145)

## 2023-01-11 LAB — CBC
HCT: 31.8 % — ABNORMAL LOW (ref 36.0–46.0)
HCT: 34.4 % — ABNORMAL LOW (ref 36.0–46.0)
Hemoglobin: 10.8 g/dL — ABNORMAL LOW (ref 12.0–15.0)
Hemoglobin: 11.3 g/dL — ABNORMAL LOW (ref 12.0–15.0)
MCH: 30.3 pg (ref 26.0–34.0)
MCH: 29.5 pg (ref 26.0–34.0)
MCHC: 34 g/dL (ref 30.0–36.0)
MCHC: 32.8 g/dL (ref 30.0–36.0)
MCV: 89.1 fL (ref 80.0–100.0)
MCV: 89.8 fL (ref 80.0–100.0)
Platelets: 188 10*3/uL (ref 150–400)
Platelets: 198 10*3/uL (ref 150–400)
RBC: 3.57 MIL/uL — ABNORMAL LOW (ref 3.87–5.11)
RBC: 3.83 MIL/uL — ABNORMAL LOW (ref 3.87–5.11)
RDW: 14.6 % (ref 11.5–15.5)
RDW: 14.6 % (ref 11.5–15.5)
WBC: 10.8 10*3/uL — ABNORMAL HIGH (ref 4.0–10.5)
WBC: 12.3 10*3/uL — ABNORMAL HIGH (ref 4.0–10.5)
nRBC: 0 % (ref 0.0–0.2)
nRBC: 0 % (ref 0.0–0.2)

## 2023-01-11 LAB — MAGNESIUM: Magnesium: 2.1 mg/dL (ref 1.7–2.4)

## 2023-01-11 LAB — PHOSPHORUS: Phosphorus: 4.2 mg/dL (ref 2.5–4.6)

## 2023-01-11 MED ORDER — TRAVASOL 10 % IV SOLN
INTRAVENOUS | Status: DC
  Filled 2023-01-11: qty 463.7

## 2023-01-11 MED ORDER — INSULIN ASPART 100 UNIT/ML IJ SOLN
0.0000 [IU] | INTRAMUSCULAR | Status: DC
  Administered 2023-01-11: 3 [IU] via SUBCUTANEOUS
  Administered 2023-01-11 – 2023-01-12 (×2): 2 [IU] via SUBCUTANEOUS
  Administered 2023-01-12 (×2): 3 [IU] via SUBCUTANEOUS

## 2023-01-11 MED ORDER — TRAMADOL HCL 50 MG PO TABS: 50.0000 mg | ORAL_TABLET | Freq: Four times a day (QID) | ORAL | 0 refills | Status: AC | PRN

## 2023-01-11 NOTE — Progress Notes (Signed)
PHARMACY - TOTAL PARENTERAL NUTRITION CONSULT NOTE   Indication: Small bowel obstruction  Patient Measurements: Height: 5\' 6"  (167.6 cm) Weight: 68.7 kg (151 lb 7.3 oz) IBW/kg (Calculated) : 59.3 TPN AdjBW (KG): 68 Body mass index is 24.45 kg/m. 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 Dema Severin for perforating ileocolic Crohn's disease.  Patient returned with SBO.   Scheduled for ileostomy takedown 01/09/23 with Dr. Dema Severin. Pharmacy was consulted to initiate TPN on 3/11.  Glucose / Insulin: Goal CBGs < 150. PMH significant for T2DM, but not on any medications PTA -CBG range: 176 - 228. 17 units SSI/24 hrs. Dexamethasone 10 mg IV x1 3/13 Electrolytes: Calcium slightly low. All others WNL. Phos trended up 3.1 > 4.2.  Renal: Stable, WNL Hepatic: LFTs, TG, T. Bili WNL Intake / Output; MIVF: LR @ KVO -UOP: 2300 mL + 1 unmeasured occurrence. Stool: none. I/O net: -648 mL GI Surgeries / Procedures:  01/09/23: Laparoscopic assisted ileostomy takedown   Central access: Double lumen PICC placed 01/07/23 TPN start date: 01/07/23  Nutritional Goals: Goal TPN rate is 85 mL/hr (provides 93 g of protein and 1896 kcals per day)  RD Assessment: Estimated Needs Total Energy Estimated Needs: 1850-2050 Total Protein Estimated Needs: 85-95g Total Fluid Estimated Needs: 2L/day   Current Nutrition:  TPN Soft diet  Plan:  Now: Adjust moderate SSI from q6h to q4h.   At 1800: Reduce TPN to 42 mL/hr (1/2 rate) per MD.  Electrolytes in TPN:  Na 150 mEq/L, K 40 mEq/L, Ca 8 mEq/L, Mg 2 mEq/L, and Phos 10 mmol/L.  Cl:Ac 1:1 Add standard MVI and trace elements to TPN Thiamine 100 mg per day x 5 days (day 4)  MIVF per MD Monitor TPN labs on Mon/Thurs.  Noted plans to discontinue TPN on 3/16 if no n/v today   Lindell Spar, PharmD, BCPS Clinical Pharmacist 01/11/2023,8:28 AM

## 2023-01-11 NOTE — Progress Notes (Addendum)
Patient reported feeling dizzy and verbalizes concerns if she is losing blood from her old stoma since it keep soaking the dressing ( day shift RN changed it 4x). This RN just changed it again. Patient mother called and also verbalizes her concerns about the patient. On call provider Dr.Cornett was paged with new orders check CBC,hold Lovenox and apply pressure dressing using Abd pads and ace wrap. We will continue to monitor.

## 2023-01-11 NOTE — Discharge Instructions (Signed)
POST OP INSTRUCTIONS  DIET: As tolerated. Follow a light bland diet the first 24 hours after arrival home, such as soup, liquids, crackers, etc.  Be sure to include lots of fluids daily.  Avoid fast food or heavy meals as your are more likely to get nauseated.  Eat a low fat the next few days after surgery.  Take your usually prescribed home medications unless otherwise directed.  PAIN CONTROL: Pain is best controlled by a usual combination of three different methods TOGETHER: Ice/Heat Over the counter pain medication Prescription pain medication Most patients will experience some swelling and bruising around the surgical site.  Ice packs or heating pads (30-60 minutes up to 6 times a day) will help. Some people prefer to use ice alone, heat alone, alternating between ice & heat.  Experiment to what works for you.  Swelling and bruising can take several weeks to resolve.   It is helpful to take an over-the-counter pain medication regularly for the first few weeks: Ibuprofen (Motrin/Advil) - 200mg  tabs - take 3 tabs (600mg ) every 6 hours as needed for pain Acetaminophen (Tylenol) - you may take 650mg  every 6 hours as needed. You can take this with motrin as they act differently on the body. If you are taking a narcotic pain medication that has acetaminophen in it, do not take over the counter tylenol at the same time.  Iii. NOTE: You may take both of these medications together - most patients find it most helpful when alternating between the two (i.e. Ibuprofen at 6am, tylenol at 9am, ibuprofen at 12pm ..Marland Kitchen) A  prescription for pain medication should be given to you upon discharge.  Take your pain medication as prescribed if your pain is not adequatly controlled with the over-the-counter pain reliefs mentioned above.  Avoid getting constipated.  Between the surgery and the pain medications, it is common to experience some constipation.  Increasing fluid intake and taking a fiber supplement (such as  Metamucil, Citrucel, FiberCon, MiraLax, etc) 1-2 times a day regularly will usually help prevent this problem from occurring.  A mild laxative (prune juice, Milk of Magnesia, MiraLax, etc) should be taken according to package directions if there are no bowel movements after 48 hours.    Dressing: Your incision is covered in Dermabond which is like sterile superglue for the skin. This will come off on it's own in a couple weeks. It is waterproof and you may bathe normally starting the day after your surgery in a shower. Avoid baths/pools/lakes/oceans until your wounds have fully healed. Cover ostomy takedown site with dry gauze and change daily. Ok to get all this wet in the shower with soap and water in the wound. Pat dry when done.  ACTIVITIES as tolerated:   Avoid heavy lifting (>10lbs or 1 gallon of milk) for the next 6 weeks. You may resume regular (light) daily activities beginning the next day--such as daily self-care, walking, climbing stairs--gradually increasing activities as tolerated.  If you can walk 30 minutes without difficulty, it is safe to try more intense activity such as jogging, treadmill, bicycling, low-impact aerobics.  DO NOT PUSH THROUGH PAIN.  Let pain be your guide: If it hurts to do something, don't do it. You may drive when you are no longer taking prescription pain medication, you can comfortably wear a seatbelt, and you can safely maneuver your car and apply brakes.   FOLLOW UP in our office Please call [facility] at [phone] to set up an appointment to see your  surgeon in the office for a follow-up appointment approximately 2 weeks after your surgery. Make sure that you call for this appointment the day you arrive home to insure a convenient appointment time.  9. If you have disability or family leave forms that need to be completed, you may have them completed by your primary care physician's office; for return to work instructions, please ask our office staff and they  will be happy to assist you in obtaining this documentation   When to call us [phone]: Poor pain control Reactions / problems with new medications (rash/itching, etc)  Fever over 101.5 F (38.5 C) Inability to urinate Nausea/vomiting Worsening swelling or bruising Continued bleeding from incision. Increased pain, redness, or drainage from the incision  The clinic staff is available to answer your questions during regular business hours (8:30am-5pm).  Please don't hesitate to call and ask to speak to one of our nurses for clinical concerns.   A surgeon from [facility] is always on call at the hospitals   If you have a medical emergency, go to the nearest emergency room or call 911.  [facility] [address], [place], [place], [place]  [address] MAIN: [phone] FAX: [phone] [email or web address]

## 2023-01-11 NOTE — Progress Notes (Addendum)
  Subjective No acute events. Feeling well. No flatus/BM yet. Tolerating some solids  Objective: Vital signs in last 24 hours: Temp:  [97.4 F (36.3 C)-98.5 F (36.9 C)] 98.4 F (36.9 C) (03/15 0617) Pulse Rate:  [68-86] 85 (03/15 0617) Resp:  [17-18] 18 (03/15 0617) BP: (106-118)/(59-66) 106/59 (03/15 0617) SpO2:  [99 %-100 %] 100 % (03/15 0617) Weight:  [68.7 kg] 68.7 kg (03/15 0500) Last BM Date : 01/09/23  Intake/Output from previous day: 03/14 0701 - 03/15 0700 In: 1651.7 [P.O.:420; I.V.:1231.7] Out: 2300 [Urine:2300] Intake/Output this shift: No intake/output data recorded.  Gen: NAD, comfortable CV: RRR Pulm: Normal work of breathing Abd: Soft, NT/ND. Ostomy packing removed, clean. Ext: SCDs in place  Lab Results: CBC  Recent Labs    01/10/23 0158 01/11/23 0358  WBC 15.8* 10.8*  HGB 12.5 10.8*  HCT 37.2 31.8*  PLT 254 188   BMET Recent Labs    01/10/23 0158 01/11/23 0358  NA 132* 135  K 4.2 3.7  CL 99 104  CO2 24 24  GLUCOSE 215* 173*  BUN 12 10  CREATININE 0.58 0.58  CALCIUM 9.0 8.5*   PT/INR No results for input(s): "LABPROT", "INR" in the last 72 hours. ABG No results for input(s): "PHART", "HCO3" in the last 72 hours.  Invalid input(s): "PCO2", "PO2"  Studies/Results:  Anti-infectives: Anti-infectives (From admission, onward)    Start     Dose/Rate Route Frequency Ordered Stop   01/09/23 1045  cefoTEtan (CEFOTAN) 2 g in sodium chloride 0.9 % 100 mL IVPB        2 g 200 mL/hr over 30 Minutes Intravenous On call to O.R. 01/09/23 1044 01/09/23 1443        Assessment/Plan: Patient Active Problem List   Diagnosis Date Noted   S/P closure of ileostomy 01/09/2023   Small bowel obstruction ([place]) 01/06/2023   Abdominal pain 01/06/2023   Nausea & vomiting 01/06/2023   Leukocytosis 01/06/2023   Ileostomy care ([place])    Intra-abdominal abscess ([place]) 06/22/2022   Need for pneumocystis prophylaxis 06/22/2022   Malnutrition of moderate  degree 06/13/2022   Right leg pain 06/12/2022   SBO (small bowel obstruction) ([place]) 06/12/2022   Hydroureteronephrosis 06/12/2022   Thrombocytosis 06/12/2022   Sepsis ([place]) 06/01/2022   Abnormal weight loss 05/28/2022   Gastroesophageal reflux disease 05/28/2022   Crohn's disease ([place]) 05/03/2022   Iron deficiency anemia 04/24/2022   Normocytic anemia 04/23/2022   Hypotension 04/23/2022   Controlled type 2 diabetes mellitus without complication, without long-term current use of insulin ([place]) 04/23/2022   Crohn's disease of ileum, with abscess ([place]) 04/22/2022   Tobacco abuse 04/22/2022   Dehydration 01/09/2022   Fistula of intestine, 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   Abdominal pain, acute, right lower quadrant 01/08/2022   Hypokalemia 01/08/2022   Diarrhea 01/08/2022   NVD (normal vaginal delivery) 09/13/2015   Diabetes in undelivered pregnancy 08/30/2015   DM2 (diabetes mellitus, type 2) ([place]) 08/29/2015   [redacted] weeks gestation of pregnancy    Prior pregnancy with fetal demise and current pregnancy in second trimester    Encounter for fetal anatomic survey    s/p Procedure(s): LAPAROSCOPIC ASSISTED ILEOSTOMY TAKEDOWN LYSIS OF ADHESION 01/09/2023  -Doing exceptionally well -Ambulate 5x/day -Soft diet as tolerated; half rate tpn, off tomorrow if no n/v today -Cover ostomy site with gauze, no packing needed -Ppx: Lovenox, SCDs   LOS: 5 days   Nadeen Landau, MD [facility], [place]

## 2023-01-12 LAB — CBC
HCT: 34 % — ABNORMAL LOW (ref 36.0–46.0)
Hemoglobin: 11.2 g/dL — ABNORMAL LOW (ref 12.0–15.0)
MCH: 29.6 pg (ref 26.0–34.0)
MCHC: 32.9 g/dL (ref 30.0–36.0)
MCV: 89.9 fL (ref 80.0–100.0)
Platelets: 209 10*3/uL (ref 150–400)
RBC: 3.78 MIL/uL — ABNORMAL LOW (ref 3.87–5.11)
RDW: 14.6 % (ref 11.5–15.5)
WBC: 12.5 10*3/uL — ABNORMAL HIGH (ref 4.0–10.5)
nRBC: 0 % (ref 0.0–0.2)

## 2023-01-12 LAB — BASIC METABOLIC PANEL
Anion gap: 8 (ref 5–15)
BUN: 9 mg/dL (ref 6–20)
CO2: 26 mmol/L (ref 22–32)
Calcium: 8.9 mg/dL (ref 8.9–10.3)
Chloride: 99 mmol/L (ref 98–111)
Creatinine, Ser: 0.51 mg/dL (ref 0.44–1.00)
GFR, Estimated: >60 mL/min (ref >60)
Glucose, Bld: 142 mg/dL — ABNORMAL HIGH (ref 70–99)
Potassium: 3.6 mmol/L (ref 3.5–5.1)
Sodium: 133 mmol/L — ABNORMAL LOW (ref 135–145)

## 2023-01-12 LAB — GLUCOSE, CAPILLARY
Glucose-Capillary: 132 mg/dL — ABNORMAL HIGH (ref 70–99)
Glucose-Capillary: 153 mg/dL — ABNORMAL HIGH (ref 70–99)

## 2023-01-12 MED ORDER — METHOCARBAMOL 500 MG PO TABS
500.0000 mg | ORAL_TABLET | Freq: Four times a day (QID) | ORAL | Status: DC | PRN
  Administered 2023-01-12 – 2023-01-15 (×8): 500 mg via ORAL
  Filled 2023-01-12 (×8): qty 1

## 2023-01-12 MED ORDER — TRAVASOL 10 % IV SOLN
INTRAVENOUS | Status: AC
  Filled 2023-01-12: qty 220.8

## 2023-01-12 MED ORDER — OXYCODONE HCL 5 MG PO TABS
5.0000 mg | ORAL_TABLET | Freq: Four times a day (QID) | ORAL | Status: DC | PRN
  Administered 2023-01-12: 5 mg via ORAL
  Administered 2023-01-12 – 2023-01-15 (×8): 10 mg via ORAL
  Filled 2023-01-12 (×5): qty 2
  Filled 2023-01-12: qty 1
  Filled 2023-01-12: qty 2
  Filled 2023-01-12: qty 1
  Filled 2023-01-12: qty 2
  Filled 2023-01-12: qty 1
  Filled 2023-01-12: qty 2

## 2023-01-12 NOTE — Progress Notes (Signed)
Mobility Specialist - Progress Note   01/12/23 1457  Mobility  Activity Ambulated with assistance in hallway  Level of Assistance Modified independent, requires aide device or extra time  Assistive Device Front wheel walker  Distance Ambulated (ft) 500 ft  Activity Response Tolerated well  Mobility Referral Yes  $Mobility charge 1 Mobility   Pt received in bed and agreeable to mobility. Complained of soreness from incisions when getting up. No other complaints during session. Pt to bed after session with all needs met.    [facility]

## 2023-01-12 NOTE — Progress Notes (Addendum)
PHARMACY - TOTAL PARENTERAL NUTRITION CONSULT NOTE   Indication: Small bowel obstruction  Patient Measurements: Height: 5\' 6"  (167.6 cm) Weight: 68.7 kg (151 lb 7.3 oz) IBW/kg (Calculated) : 59.3 TPN AdjBW (KG): 68 Body mass index is 24.45 kg/m. 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 Dema Severin for perforating ileocolic Crohn's disease.  Patient returned with SBO.   Scheduled for ileostomy takedown 01/09/23 with Dr. Dema Severin. Pharmacy was consulted to initiate TPN on 3/11.  Recent Labs    01/10/23 0158 01/11/23 0358 01/12/23 0433  NA 132* 135 133*  K 4.2 3.7 3.6  CL 99 104 99  CO2 24 24 26   GLUCOSE 215* 173* 142*  BUN 12 10 9   CREATININE 0.58 0.58 0.51  CALCIUM 9.0 8.5* 8.9  PHOS 3.1 4.2  --   MG 2.0 2.1  --   ALBUMIN 3.9  --   --   ALKPHOS 34*  --   --   AST 22  --   --   ALT 32  --   --   BILITOT 0.7  --   --   TRIG 50  --   --       Glucose / Insulin: Goal CBGs < 150. PMH significant for T2DM, but not on any medications PTA -CBG range: 135-183. 13 units SSI/24 hrs. Did receive dexamethasone 10 mg IV x1 3/13 Electrolytes: Na slightly low. All others WNL. Renal: Stable, WNL Hepatic: LFTs, TG, T. Bili WNL MIVF: LR @ KVO GI Surgeries / Procedures:  01/09/23: Laparoscopic assisted ileostomy takedown   Central access: Double lumen PICC placed 01/07/23 TPN start date: 01/07/23  Nutritional Goals: Goal TPN rate is 85 mL/hr (provides 93 g of protein and 1896 kcals per day)  RD Assessment: Estimated Needs Total Energy Estimated Needs: 1850-2050 Total Protein Estimated Needs: 85-95g Total Fluid Estimated Needs: 2L/day   Current Nutrition:  TPN at 1/2 goal rate (42 mL/hr) Soft diet  Plan:  -Per Dr. Marcello Moores, may stop TPN after current bag  -At 1600 hr, reduce rate to 20 mL/hr -At 1800 hr, discontinue TPN -DC TPN labs, CBGs, and SSI.  Clayburn Pert, PharmD, BCPS Clinical Pharmacist 01/12/2023  10:01 AM

## 2023-01-12 NOTE — Progress Notes (Signed)
Subjective No acute events. Feeling well. Having flatus, no BM yet. Tolerating some solids.  Stoma site bleeding last night.  Pressure dressing applied  Objective: Vital signs in last 24 hours: Temp:  [98.2 F (36.8 C)-98.5 F (36.9 C)] 98.5 F (36.9 C) (03/16 0520) Pulse Rate:  [92-101] 98 (03/16 0520) Resp:  [14-18] 18 (03/16 0520) BP: (105-109)/(60-63) 106/62 (03/16 0520) SpO2:  [96 %-100 %] 96 % (03/16 0520) Last BM Date : 01/09/23  Intake/Output from previous day: 03/15 0701 - 03/16 0700 In: 2442.6 [P.O.:960; I.V.:1482.6] Out: 1400 [Urine:1400] Intake/Output this shift: No intake/output data recorded.  Gen: NAD, comfortable CV: RRR Pulm: Normal work of breathing Abd: Soft, NT/ND. Ostomy site with pressure dressing and old blood. Ext: SCDs in place  Lab Results: CBC  Recent Labs    01/11/23 2208 01/12/23 0433  WBC 12.3* 12.5*  HGB 11.3* 11.2*  HCT 34.4* 34.0*  PLT 198 209    BMET Recent Labs    01/11/23 0358 01/12/23 0433  NA 135 133*  K 3.7 3.6  CL 104 99  CO2 24 26  GLUCOSE 173* 142*  BUN 10 9  CREATININE 0.58 0.51  CALCIUM 8.5* 8.9    PT/INR No results for input(s): "LABPROT", "INR" in the last 72 hours. ABG No results for input(s): "PHART", "HCO3" in the last 72 hours.  Invalid input(s): "PCO2", "PO2"  Studies/Results:  Anti-infectives: Anti-infectives (From admission, onward)    Start     Dose/Rate Route Frequency Ordered Stop   01/09/23 1045  cefoTEtan (CEFOTAN) 2 g in sodium chloride 0.9 % 100 mL IVPB        2 g 200 mL/hr over 30 Minutes Intravenous On call to O.R. 01/09/23 1044 01/09/23 1443        Assessment/Plan: Patient Active Problem List   Diagnosis Date Noted   S/P closure of ileostomy 01/09/2023   Small bowel obstruction ([place]) 01/06/2023   Abdominal pain 01/06/2023   Nausea & vomiting 01/06/2023   Leukocytosis 01/06/2023   Ileostomy care ([place])    Intra-abdominal abscess ([place]) 06/22/2022   Need for pneumocystis  prophylaxis 06/22/2022   Malnutrition of moderate degree 06/13/2022   Right leg pain 06/12/2022   SBO (small bowel obstruction) ([place]) 06/12/2022   Hydroureteronephrosis 06/12/2022   Thrombocytosis 06/12/2022   Sepsis ([place]) 06/01/2022   Abnormal weight loss 05/28/2022   Gastroesophageal reflux disease 05/28/2022   Crohn's disease ([place]) 05/03/2022   Iron deficiency anemia 04/24/2022   Normocytic anemia 04/23/2022   Hypotension 04/23/2022   Controlled type 2 diabetes mellitus without complication, without long-term current use of insulin ([place]) 04/23/2022   Crohn's disease of ileum, with abscess ([place]) 04/22/2022   Tobacco abuse 04/22/2022   Dehydration 01/09/2022   Fistula of intestine, 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   Abdominal pain, acute, right lower quadrant 01/08/2022   Hypokalemia 01/08/2022   Diarrhea 01/08/2022   NVD (normal vaginal delivery) 09/13/2015   Diabetes in undelivered pregnancy 08/30/2015   DM2 (diabetes mellitus, type 2) ([place]) 08/29/2015   [redacted] weeks gestation of pregnancy    Prior pregnancy with fetal demise and current pregnancy in second trimester    Encounter for fetal anatomic survey    s/p Procedure(s): LAPAROSCOPIC ASSISTED ILEOSTOMY TAKEDOWN LYSIS OF ADHESION 01/09/2023  -Doing exceptionally well -Ambulate 5x/day -Soft diet as tolerated; half rate tpn, d/c today -Cover ostomy site with gauze, no packing needed -Ppx: Lovenox, SCDs   LOS: 6 days   Rosario Adie, MD  Colorectal and General Surgery  [facility]

## 2023-01-13 MED ORDER — HYDROMORPHONE HCL 1 MG/ML IJ SOLN
1.0000 mg | INTRAMUSCULAR | Status: DC | PRN
  Administered 2023-01-13 – 2023-01-15 (×8): 1 mg via INTRAVENOUS
  Filled 2023-01-13 (×9): qty 1

## 2023-01-13 NOTE — Progress Notes (Signed)
4 Days Post-Op   Subjective/Chief Complaint: Doing ok  Bloated  and not much bowel function   Objective: Vital signs in last 24 hours: Temp:  [98.4 F (36.9 C)-98.9 F (37.2 C)] 98.5 F (36.9 C) (03/17 0531) Pulse Rate:  [101-110] 106 (03/17 0531) Resp:  [16] 16 (03/17 0531) BP: (102-110)/(56-69) 110/60 (03/17 0531) SpO2:  [99 %-100 %] 100 % (03/17 0531) Last BM Date : 01/09/23  Intake/Output from previous day: 03/16 0701 - 03/17 0700 In: 1117.3 [P.O.:1020; I.V.:97.3] Out: -  Intake/Output this shift: No intake/output data recorded.   Gen: NAD, comfortable CV: RRR Pulm: Normal work of breathing Abd: Soft, NT/ND. Ostomy site with pressure dressing and old blood. Ext: SCDs in place Lab Results:  Recent Labs    01/11/23 2208 01/12/23 0433  WBC 12.3* 12.5*  HGB 11.3* 11.2*  HCT 34.4* 34.0*  PLT 198 209   BMET Recent Labs    01/11/23 0358 01/12/23 0433  NA 135 133*  K 3.7 3.6  CL 104 99  CO2 24 26  GLUCOSE 173* 142*  BUN 10 9  CREATININE 0.58 0.51  CALCIUM 8.5* 8.9   PT/INR No results for input(s): "LABPROT", "INR" in the last 72 hours. ABG No results for input(s): "PHART", "HCO3" in the last 72 hours.  Invalid input(s): "PCO2", "PO2"  Studies/Results: No results found.  Anti-infectives: Anti-infectives (From admission, onward)    Start     Dose/Rate Route Frequency Ordered Stop   01/09/23 1045  cefoTEtan (CEFOTAN) 2 g in sodium chloride 0.9 % 100 mL IVPB        2 g 200 mL/hr over 30 Minutes Intravenous On call to O.R. 01/09/23 1044 01/09/23 1443       Assessment/Plan: s/p Procedure(s): LAPAROSCOPIC ASSISTED ILEOSTOMY TAKEDOWN (N/A) LYSIS OF ADHESION (N/A) Change dressing  Continue ambulation Continue diet  Home in next 24 - 48 hours   LOS: 7 days    Turner Daniels MD  01/13/2023

## 2023-01-14 LAB — TRIGLYCERIDES: Triglycerides: 88 mg/dL (ref <150)

## 2023-01-14 MED ORDER — SILVER NITRATE-POT NITRATE 75-25 % EX MISC
1.0000 | Freq: Once | CUTANEOUS | Status: AC
  Administered 2023-01-14: 1 via TOPICAL
  Filled 2023-01-14: qty 1

## 2023-01-14 NOTE — Progress Notes (Signed)
Subjective No acute events. Feeling well. Having flatus and now 3 BM. Tolerating solids.  Stoma site bleeding - none further overnight. No blood in stool.  Objective: Vital signs in last 24 hours: Temp:  [98.2 F (36.8 C)-98.5 F (36.9 C)] 98.5 F (36.9 C) (03/16 0520) Pulse Rate:  [92-101] 98 (03/16 0520) Resp:  [14-18] 18 (03/16 0520) BP: (105-109)/(60-63) 106/62 (03/16 0520) SpO2:  [96 %-100 %] 96 % (03/16 0520) Last BM Date : 01/09/23  Intake/Output from previous day: 03/15 0701 - 03/16 0700 In: 2442.6 [P.O.:960; I.V.:1482.6] Out: 1400 [Urine:1400] Intake/Output this shift: No intake/output data recorded.  Gen: NAD, comfortable CV: RRR Pulm: Normal work of breathing Abd: Soft, NT/ND. Ostomy site clean, no active bleeding Ext: SCDs in place  Lab Results: CBC  Recent Labs    01/11/23 2208 01/12/23 0433  WBC 12.3* 12.5*  HGB 11.3* 11.2*  HCT 34.4* 34.0*  PLT 198 209    BMET Recent Labs    01/11/23 0358 01/12/23 0433  NA 135 133*  K 3.7 3.6  CL 104 99  CO2 24 26  GLUCOSE 173* 142*  BUN 10 9  CREATININE 0.58 0.51  CALCIUM 8.5* 8.9    PT/INR No results for input(s): "LABPROT", "INR" in the last 72 hours. ABG No results for input(s): "PHART", "HCO3" in the last 72 hours.  Invalid input(s): "PCO2", "PO2"  Studies/Results:  Anti-infectives: Anti-infectives (From admission, onward)    Start     Dose/Rate Route Frequency Ordered Stop   01/09/23 1045  cefoTEtan (CEFOTAN) 2 g in sodium chloride 0.9 % 100 mL IVPB        2 g 200 mL/hr over 30 Minutes Intravenous On call to O.R. 01/09/23 1044 01/09/23 1443        Assessment/Plan: Patient Active Problem List   Diagnosis Date Noted   S/P closure of ileostomy 01/09/2023   Small bowel obstruction ([place]) 01/06/2023   Abdominal pain 01/06/2023   Nausea & vomiting 01/06/2023   Leukocytosis 01/06/2023   Ileostomy care ([place])    Intra-abdominal abscess ([place]) 06/22/2022   Need for pneumocystis  prophylaxis 06/22/2022   Malnutrition of moderate degree 06/13/2022   Right leg pain 06/12/2022   SBO (small bowel obstruction) ([place]) 06/12/2022   Hydroureteronephrosis 06/12/2022   Thrombocytosis 06/12/2022   Sepsis ([place]) 06/01/2022   Abnormal weight loss 05/28/2022   Gastroesophageal reflux disease 05/28/2022   Crohn's disease ([place]) 05/03/2022   Iron deficiency anemia 04/24/2022   Normocytic anemia 04/23/2022   Hypotension 04/23/2022   Controlled type 2 diabetes mellitus without complication, without long-term current use of insulin ([place]) 04/23/2022   Crohn's disease of ileum, with abscess ([place]) 04/22/2022   Tobacco abuse 04/22/2022   Dehydration 01/09/2022   Fistula of intestine, 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   Abdominal pain, acute, right lower quadrant 01/08/2022   Hypokalemia 01/08/2022   Diarrhea 01/08/2022   NVD (normal vaginal delivery) 09/13/2015   Diabetes in undelivered pregnancy 08/30/2015   DM2 (diabetes mellitus, type 2) ([place]) 08/29/2015   [redacted] weeks gestation of pregnancy    Prior pregnancy with fetal demise and current pregnancy in second trimester    Encounter for fetal anatomic survey    s/p Procedure(s): LAPAROSCOPIC ASSISTED ILEOSTOMY TAKEDOWN LYSIS OF ADHESION 01/09/2023  -Doing exceptionally well -Ambulate 5x/day -Soft diet as tolerated -Cover ostomy site with gauze, no packing needed -Probable discharge tomorrow if continues to do well -Ppx: Lovenox, SCDs   LOS: 6 days   Nadeen Landau, MD [facility]  Surgery, A [facility]

## 2023-01-15 NOTE — Progress Notes (Signed)
  Subjective No acute events. Feeling well. Having flatus and now 3 BM. Tolerating solids.  Stoma site bleeding - none further overnight. No blood in stool.  Objective: Vital signs in last 24 hours: Temp:  [98.4 F (36.9 C)-99.5 F (37.5 C)] 98.6 F (37 C) (03/19 0557) Pulse Rate:  [95-104] 100 (03/19 0557) Resp:  [14-18] 18 (03/19 0557) BP: (109-125)/(67-77) 119/77 (03/19 0557) SpO2:  [100 %] 100 % (03/19 0557) Last BM Date : 01/14/23  Intake/Output from previous day: 03/18 0701 - 03/19 0700 In: [id number] [P.O.:1670; I.V.:110] Out: -  Intake/Output this shift: No intake/output data recorded.  Gen: NAD, comfortable CV: RRR Pulm: Normal work of breathing Abd: Soft, NT/ND. Ostomy site clean, no active bleeding Ext: SCDs in place  Lab Results: CBC  No results for input(s): "WBC", "HGB", "HCT", "PLT" in the last 72 hours. BMET No results for input(s): "NA", "K", "CL", "CO2", "GLUCOSE", "BUN", "CREATININE", "CALCIUM" in the last 72 hours. PT/INR No results for input(s): "LABPROT", "INR" in the last 72 hours. ABG No results for input(s): "PHART", "HCO3" in the last 72 hours.  Invalid input(s): "PCO2", "PO2"  Studies/Results:  Anti-infectives: Anti-infectives (From admission, onward)    Start     Dose/Rate Route Frequency Ordered Stop   01/09/23 1045  cefoTEtan (CEFOTAN) 2 g in sodium chloride 0.9 % 100 mL IVPB        2 g 200 mL/hr over 30 Minutes Intravenous On call to O.R. 01/09/23 1044 01/09/23 1443        Assessment/Plan: Patient Active Problem List   Diagnosis Date Noted   S/P closure of ileostomy 01/09/2023   Small bowel obstruction ([place]) 01/06/2023   Abdominal pain 01/06/2023   Nausea & vomiting 01/06/2023   Leukocytosis 01/06/2023   Ileostomy care ([place])    Intra-abdominal abscess ([place]) 06/22/2022   Need for pneumocystis prophylaxis 06/22/2022   Malnutrition of moderate degree 06/13/2022   Right leg pain 06/12/2022   SBO (small bowel obstruction) ([place])  06/12/2022   Hydroureteronephrosis 06/12/2022   Thrombocytosis 06/12/2022   Sepsis ([place]) 06/01/2022   Abnormal weight loss 05/28/2022   Gastroesophageal reflux disease 05/28/2022   Crohn's disease ([place]) 05/03/2022   Iron deficiency anemia 04/24/2022   Normocytic anemia 04/23/2022   Hypotension 04/23/2022   Controlled type 2 diabetes mellitus without complication, without long-term current use of insulin ([place]) 04/23/2022   Crohn's disease of ileum, with abscess ([place]) 04/22/2022   Tobacco abuse 04/22/2022   Dehydration 01/09/2022   Fistula of intestine, 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   Abdominal pain, acute, right lower quadrant 01/08/2022   Hypokalemia 01/08/2022   Diarrhea 01/08/2022   NVD (normal vaginal delivery) 09/13/2015   Diabetes in undelivered pregnancy 08/30/2015   DM2 (diabetes mellitus, type 2) ([place]) 08/29/2015   [redacted] weeks gestation of pregnancy    Prior pregnancy with fetal demise and current pregnancy in second trimester    Encounter for fetal anatomic survey    s/p Procedure(s): LAPAROSCOPIC ASSISTED ILEOSTOMY TAKEDOWN LYSIS OF ADHESION 01/09/2023  -Doing well - no further bleeding s/p AgNO3 application to skin site oozing vessel -Ambulate 5x/day -Soft diet as tolerated -Discharge home today -Ppx: SCDs; held lovenox in light of ongoing oozing from former stoma site   LOS: 9 days   Nadeen Landau, MD [facility] Surgery, [place]

## 2023-01-15 NOTE — Discharge Summary (Signed)
Patient ID: Sandra Foster MRN: [id number] DOB/AGE: [birth year] 32 y.o.  Admit date: 01/05/2023 Discharge date: 01/15/2023  Discharge Diagnoses Patient Active Problem List   Diagnosis Date Noted   S/P closure of ileostomy 01/09/2023   Small bowel obstruction ([place]) 01/06/2023   Abdominal pain 01/06/2023   Nausea & vomiting 01/06/2023   Leukocytosis 01/06/2023   Ileostomy care ([place])    Intra-abdominal abscess ([place]) 06/22/2022   Need for pneumocystis prophylaxis 06/22/2022   Malnutrition of moderate degree 06/13/2022   Right leg pain 06/12/2022   SBO (small bowel obstruction) ([place]) 06/12/2022   Hydroureteronephrosis 06/12/2022   Thrombocytosis 06/12/2022   Sepsis ([place]) 06/01/2022   Abnormal weight loss 05/28/2022   Gastroesophageal reflux disease 05/28/2022   Crohn's disease ([place]) 05/03/2022   Iron deficiency anemia 04/24/2022   Normocytic anemia 04/23/2022   Hypotension 04/23/2022   Controlled type 2 diabetes mellitus without complication, without long-term current use of insulin ([place]) 04/23/2022   Crohn's disease of ileum, with abscess ([place]) 04/22/2022   Tobacco abuse 04/22/2022   Dehydration 01/09/2022   Fistula of intestine, 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   Abdominal pain, acute, right lower quadrant 01/08/2022   Hypokalemia 01/08/2022   Diarrhea 01/08/2022   NVD (normal vaginal delivery) 09/13/2015   Diabetes in undelivered pregnancy 08/30/2015   DM2 (diabetes mellitus, type 2) ([place]) 08/29/2015   [redacted] weeks gestation of pregnancy    Prior pregnancy with fetal demise and current pregnancy in second trimester    Encounter for fetal anatomic survey     Consultants Medicine initially  Procedures OR 01/09/23 ileostomy takedown (laparoscopic assisted)   Hospital Course: She had been admitted to the hospital prior to her surgery for some partial obstructive type symptoms at the ostomy site that was managed with a red rubber initially.  She was just discharged but then  readmitted with a similar issue.  She was initially started on TPN to ensure that her nutritional status was optimized and then taken for her then scheduled procedure-ileostomy reversal 01/09/2023.  The procedure went well.  She was transferred back to her room postoperatively and recovered.  Her diet was gradually advanced.  She began having spontaneous bowel function.  She did have some bleeding presumably from the skin site or subcutaneous fat at the level of her former ostomy site.  This was ultimately managed with application of silver nitrate with success.  On 01/15/2023, she had no further bleeding.  Her pain is well-controlled.  She is tolerating a diet and having reliable bowel function.  She is up walking around on her own.  She is comfortable with and stable for discharge home.  She has been instructed on the postoperative expectations.  All of her questions were answered.  We discussed that she should likely resume her Biologics for her Crohn's disease in the next couple of weeks and follow back up within the next week or 2 with Dr. Collene Mares.    Allergies as of 01/15/2023       Reactions   Nsaids Other (See Comments)   Crohn's disease = NO NSAIDs        Medication List     STOP taking these medications    metroNIDAZOLE 500 MG tablet Commonly known as: FLAGYL   neomycin 500 MG tablet Commonly known as: MYCIFRADIN       TAKE these medications    acetaminophen 500 MG tablet Commonly known as: TYLENOL Take 1 tablet (500 mg total) by mouth every 6 (six) hours as needed.  Nexplanon 68 MG Impl implant Generic drug: etonogestrel 1 each by Subdermal route once.   traMADol 50 MG tablet Commonly known as: Ultram Take 1 tablet (50 mg total) by mouth every 6 (six) hours as needed for up to 5 days (postop pain not controlled with tylenol first).          Follow-up Information     Ileana Roup, MD Follow up on 02/04/2023.   Specialties: General Surgery, Colon and  Rectal Surgery Why: Arrive at 8:50 am Contact information: [place] [id number] [phone]                 Sharon Mt. Dema Severin, M.D. [facility]

## 2023-01-15 NOTE — Progress Notes (Signed)
Reviewed written D/C orders with pt and all questions answered. Pt verbalized understanding. Pt left with all belongings in stable condition.

## 2023-01-15 NOTE — Progress Notes (Signed)
Patient's PICC line removed per MD order. Patient education provided: keep dressing dry and intact for 24 hours, no heavy lifting with rt arm for 24 hours, signs & symptoms of infection, and when to call MD. Patient verbalized understanding.

## 2023-06-10 ENCOUNTER — Other Ambulatory Visit: Payer: Self-pay

## 2023-06-10 ENCOUNTER — Encounter (HOSPITAL_COMMUNITY): Payer: Self-pay | Admitting: *Deleted

## 2023-06-10 ENCOUNTER — Emergency Department (HOSPITAL_COMMUNITY)
Admission: EM | Admit: 2023-06-10 | Discharge: 2023-06-10 | Disposition: A | Discharge status: Home / Self Care | Payer: Medicaid Other | Attending: Emergency Medicine | Admitting: Emergency Medicine

## 2023-06-10 ENCOUNTER — Emergency Department (HOSPITAL_COMMUNITY): Payer: Medicaid Other

## 2023-06-10 DIAGNOSIS — N3001 Acute cystitis with hematuria: Secondary | ICD-10-CM | POA: Diagnosis not present

## 2023-06-10 DIAGNOSIS — R112 Nausea with vomiting, unspecified: Secondary | ICD-10-CM | POA: Diagnosis present

## 2023-06-10 DIAGNOSIS — Z1152 Encounter for screening for COVID-19: Secondary | ICD-10-CM | POA: Insufficient documentation

## 2023-06-10 DIAGNOSIS — E119 Type 2 diabetes mellitus without complications: Secondary | ICD-10-CM | POA: Insufficient documentation

## 2023-06-10 DIAGNOSIS — E876 Hypokalemia: Secondary | ICD-10-CM

## 2023-06-10 DIAGNOSIS — M25512 Pain in left shoulder: Secondary | ICD-10-CM | POA: Diagnosis not present

## 2023-06-10 DIAGNOSIS — M545 Low back pain, unspecified: Secondary | ICD-10-CM | POA: Diagnosis not present

## 2023-06-10 DIAGNOSIS — R197 Diarrhea, unspecified: Secondary | ICD-10-CM | POA: Diagnosis not present

## 2023-06-10 LAB — CBC
HCT: 35.5 % — ABNORMAL LOW (ref 36.0–46.0)
Hemoglobin: 12 g/dL (ref 12.0–15.0)
MCH: 29.3 pg (ref 26.0–34.0)
MCHC: 33.8 g/dL (ref 30.0–36.0)
MCV: 86.6 fL (ref 80.0–100.0)
Platelets: 256 10*3/uL (ref 150–400)
RBC: 4.1 MIL/uL (ref 3.87–5.11)
RDW: 13.2 % (ref 11.5–15.5)
WBC: 12.8 10*3/uL — ABNORMAL HIGH (ref 4.0–10.5)
nRBC: 0 % (ref 0.0–0.2)

## 2023-06-10 LAB — HCG, SERUM, QUALITATIVE: Preg, Serum: NEGATIVE

## 2023-06-10 LAB — TROPONIN I (HIGH SENSITIVITY)
Troponin I (High Sensitivity): 2 ng/L (ref <18)
Troponin I (High Sensitivity): <2 ng/L (ref <18)

## 2023-06-10 LAB — COMPREHENSIVE METABOLIC PANEL
ALT: 41 U/L (ref 0–44)
AST: 25 U/L (ref 15–41)
Albumin: 3.5 g/dL (ref 3.5–5.0)
Alkaline Phosphatase: 81 U/L (ref 38–126)
Anion gap: 13 (ref 5–15)
BUN: <5 mg/dL — ABNORMAL LOW (ref 6–20)
CO2: 22 mmol/L (ref 22–32)
Calcium: 8.8 mg/dL — ABNORMAL LOW (ref 8.9–10.3)
Chloride: 99 mmol/L (ref 98–111)
Creatinine, Ser: 0.77 mg/dL (ref 0.44–1.00)
GFR, Estimated: >60 mL/min (ref >60)
Glucose, Bld: 160 mg/dL — ABNORMAL HIGH (ref 70–99)
Potassium: 2.6 mmol/L — CL (ref 3.5–5.1)
Sodium: 134 mmol/L — ABNORMAL LOW (ref 135–145)
Total Bilirubin: 0.8 mg/dL (ref 0.3–1.2)
Total Protein: 7.4 g/dL (ref 6.5–8.1)

## 2023-06-10 LAB — URINALYSIS, ROUTINE W REFLEX MICROSCOPIC
Bilirubin Urine: NEGATIVE
Glucose, UA: NEGATIVE mg/dL
Ketones, ur: NEGATIVE mg/dL
Nitrite: NEGATIVE
Protein, ur: NEGATIVE mg/dL
Specific Gravity, Urine: 1.006 (ref 1.005–1.030)
pH: 6 (ref 5.0–8.0)

## 2023-06-10 LAB — RESP PANEL BY RT-PCR (RSV, FLU A&B, COVID)  RVPGX2
Influenza A by PCR: NEGATIVE
Influenza B by PCR: NEGATIVE
Resp Syncytial Virus by PCR: NEGATIVE
SARS Coronavirus 2 by RT PCR: NEGATIVE

## 2023-06-10 LAB — D-DIMER, QUANTITATIVE: D-Dimer, Quant: 0.86 ug/mL-FEU — ABNORMAL HIGH (ref 0.00–0.50)

## 2023-06-10 LAB — LIPASE, BLOOD: Lipase: 22 U/L (ref 11–51)

## 2023-06-10 LAB — MAGNESIUM: Magnesium: 2 mg/dL (ref 1.7–2.4)

## 2023-06-10 MED ORDER — CEPHALEXIN 500 MG PO CAPS: 500.0000 mg | ORAL_CAPSULE | Freq: Two times a day (BID) | ORAL | 0 refills | Status: AC

## 2023-06-10 MED ORDER — POTASSIUM CHLORIDE CRYS ER 20 MEQ PO TBCR
40.0000 meq | EXTENDED_RELEASE_TABLET | Freq: Once | ORAL | Status: AC
  Administered 2023-06-10: 40 meq via ORAL
  Filled 2023-06-10: qty 4

## 2023-06-10 MED ORDER — SODIUM CHLORIDE 0.9 % IV SOLN
1.0000 g | INTRAVENOUS | Status: AC
  Administered 2023-06-10: 1 g via INTRAVENOUS
  Filled 2023-06-10 (×2): qty 10

## 2023-06-10 MED ORDER — POTASSIUM CHLORIDE CRYS ER 20 MEQ PO TBCR: 20.0000 meq | EXTENDED_RELEASE_TABLET | Freq: Two times a day (BID) | ORAL | 0 refills | Status: AC

## 2023-06-10 MED ORDER — IOHEXOL 350 MG/ML SOLN
100.0000 mL | Freq: Once | INTRAVENOUS | Status: AC | PRN
  Administered 2023-06-10: 100 mL via INTRAVENOUS

## 2023-06-10 MED ORDER — ONDANSETRON HCL 4 MG PO TABS: 4.0000 mg | ORAL_TABLET | Freq: Three times a day (TID) | ORAL | 0 refills | Status: AC | PRN

## 2023-06-10 MED ORDER — ACETAMINOPHEN 500 MG PO TABS
1000.0000 mg | ORAL_TABLET | Freq: Once | ORAL | Status: AC
  Administered 2023-06-10: 1000 mg via ORAL
  Filled 2023-06-10: qty 2

## 2023-06-10 MED ORDER — POTASSIUM CHLORIDE 10 MEQ/100ML IV SOLN
10.0000 meq | INTRAVENOUS | Status: AC
  Administered 2023-06-10 (×2): 10 meq via INTRAVENOUS
  Filled 2023-06-10 (×2): qty 100

## 2023-06-10 MED ORDER — SODIUM CHLORIDE 0.9 % IV BOLUS
1000.0000 mL | Freq: Once | INTRAVENOUS | Status: AC
  Administered 2023-06-10: 1000 mL via INTRAVENOUS

## 2023-06-10 MED ORDER — POTASSIUM CHLORIDE 20 MEQ PO PACK
60.0000 meq | PACK | ORAL | Status: AC
  Administered 2023-06-10: 60 meq via ORAL
  Filled 2023-06-10: qty 3

## 2023-06-10 MED ORDER — HYDROMORPHONE HCL 1 MG/ML IJ SOLN
0.5000 mg | Freq: Once | INTRAMUSCULAR | Status: AC
  Administered 2023-06-10: 0.5 mg via INTRAVENOUS
  Filled 2023-06-10: qty 1

## 2023-06-10 NOTE — ED Provider Notes (Signed)
[place] EMERGENCY DEPARTMENT AT [facility] Provider Note   CSN: [id number] Arrival date & time: 06/10/23  1143     History  Chief Complaint  Patient presents with   Emesis   Nausea   Diarrhea    Sandra Foster is a 32 y.o. female with a PMHx of DM who presents to the ED with concerns for nausea, vomiting, diarrhea x 2 days.  Denies sick contacts.  Has intermittent subjective fever, sternal chest wall pain, lower back pain, neck pain, shortness of breath.  No meds tried prior to arrival. Also notes chest pain exacerbated with movement.  Denies any chest pain being worse with inspiration.  Notes that her chest pain started today. Denies abdominal pain, urinary symptoms, rhinorrhea, nasal congestion, cough.  Has a history of diabetes however is not currently on medications.  Denies PMHx of MI, HTN, CAD, family history of MI in someone younger than age 50, stents.  Patient is currently on the Nexplanon.  Denies anticoagulant use.  Pt denies recent travel, immobilization, surgery, estrogen use, or PMHx of PE/DVT.   The history is provided by the patient. No language interpreter was used.       Home Medications Prior to Admission medications   Medication Sig Start Date End Date Taking? Authorizing Provider  cephALEXin (KEFLEX) 500 MG capsule Take 1 capsule (500 mg total) by mouth 2 (two) times daily for 5 days. 06/10/23 06/15/23 Yes ,  A, PA-C  ondansetron (ZOFRAN) 4 MG tablet Take 1 tablet (4 mg total) by mouth every 8 (eight) hours as needed for nausea or vomiting. 06/10/23  Yes ,  A, PA-C  potassium chloride SA (KLOR-CON M) 20 MEQ tablet Take 1 tablet (20 mEq total) by mouth 2 (two) times daily for 5 days. 06/10/23 06/15/23 Yes ,  A, PA-C  acetaminophen (TYLENOL) 500 MG tablet Take 1 tablet (500 mg total) by mouth every 6 (six) hours as needed. 05/22/22   Derwood Kaplan, MD  etonogestrel (NEXPLANON) 68 MG IMPL implant 1 each by Subdermal route  once.    [provider]  amitriptyline (ELAVIL) 25 MG tablet Take 1-2 tablets (25-50 mg total) by mouth at bedtime as needed (headache). 03/25/20 06/18/20  Rodolph Bong, MD      Allergies    Nsaids    Review of Systems   Review of Systems  Gastrointestinal:  Positive for diarrhea and vomiting.  All other systems reviewed and are negative.   Physical Exam Updated Vital Signs BP 110/76   Pulse 95   Temp 100.1 F (37.8 C) (Oral) Comment: RN notified  Resp 17   Ht 5\' 6"  (1.676 m)   Wt 73.9 kg   SpO2 98%   BMI 26.31 kg/m  Physical Exam Vitals and nursing note reviewed.  Constitutional:      General: She is not in acute distress.    Appearance: She is not diaphoretic.  HENT:     Head: Normocephalic and atraumatic.     Mouth/Throat:     Pharynx: No oropharyngeal exudate.  Eyes:     General: No scleral icterus.    Conjunctiva/sclera: Conjunctivae normal.  Cardiovascular:     Rate and Rhythm: Normal rate and regular rhythm.     Pulses: Normal pulses.     Heart sounds: Normal heart sounds.  Pulmonary:     Effort: Pulmonary effort is normal. No respiratory distress.     Breath sounds: Normal breath sounds. No wheezing.  Chest:  Chest wall: No tenderness.  Abdominal:     General: Bowel sounds are normal.     Palpations: Abdomen is soft. There is no mass.     Tenderness: There is abdominal tenderness in the right lower quadrant, suprapubic area and left lower quadrant. There is no guarding or rebound.     Comments: Mild lower abdominal TTP.  Musculoskeletal:        General: Normal range of motion.     Cervical back: Normal range of motion and neck supple.     Comments: No spinal tenderness to palpation.  Tenderness to palpation noted to left upper trapezius and bilateral lumbar musculature.  No overlying skin changes noted.  Patient able to move all extremities x 4 without assistance or difficulty.  Skin:    General: Skin is warm and dry.  Neurological:      Mental Status: She is alert.  Psychiatric:        Behavior: Behavior normal.     ED Results / Procedures / Treatments   Labs (all labs ordered are listed, but only abnormal results are displayed) Labs Reviewed  COMPREHENSIVE METABOLIC PANEL - Abnormal; Notable for the following components:      Result Value   Sodium 134 (*)    Potassium 2.6 (*)    Glucose, Bld 160 (*)    BUN <5 (*)    Calcium 8.8 (*)    All other components within normal limits  CBC - Abnormal; Notable for the following components:   WBC 12.8 (*)    HCT 35.5 (*)    All other components within normal limits  URINALYSIS, ROUTINE W REFLEX MICROSCOPIC - Abnormal; Notable for the following components:   APPearance HAZY (*)    Hgb urine dipstick SMALL (*)    Leukocytes,Ua LARGE (*)    Bacteria, UA RARE (*)    All other components within normal limits  D-DIMER, QUANTITATIVE - Abnormal; Notable for the following components:   D-Dimer, Quant 0.86 (*)    All other components within normal limits  RESP PANEL BY RT-PCR (RSV, FLU A&B, COVID)  RVPGX2  URINE CULTURE  LIPASE, BLOOD  HCG, SERUM, QUALITATIVE  MAGNESIUM  TROPONIN I (HIGH SENSITIVITY)  TROPONIN I (HIGH SENSITIVITY)    EKG EKG Interpretation Date/Time:  Monday June 10 2023 11:59:56 EDT Ventricular Rate:  94 PR Interval:  111 QRS Duration:  100 QT Interval:  360 QTC Calculation: 451 R Axis:   50  Text Interpretation: Sinus rhythm Borderline short PR interval Borderline repolarization abnormality Confirmed by Vonita Moss [phone]) on 06/10/2023 5:16:31 PM  Radiology CT Angio Chest PE W and/or Wo Contrast  Result Date: 06/10/2023 CLINICAL DATA:  Pulmonary embolism (PE) suspected, low to intermediate prob, positive D-dimer EXAM: CT ANGIOGRAPHY CHEST WITH CONTRAST TECHNIQUE: Multidetector CT imaging of the chest was performed using the standard protocol during bolus administration of intravenous contrast. Multiplanar CT image reconstructions and MIPs  were obtained to evaluate the vascular anatomy. RADIATION DOSE REDUCTION: This exam was performed according to the departmental dose-optimization program which includes automated exposure control, adjustment of the mA and/or kV according to patient size and/or use of iterative reconstruction technique. CONTRAST:  OMNIPAQUE IOHEXOL 350 MG/ML SOLN COMPARISON:  Chest radiograph same day FINDINGS: Cardiovascular: Satisfactory opacification of the pulmonary arteries to the segmental level. No evidence of pulmonary embolism. The thoracic aorta is normal in caliber. Normal heart size. No pericardial effusion. Mediastinum/Nodes: No enlarged mediastinal, hilar, or axillary lymph nodes. The thyroid gland appears  normal. Lungs/Pleura: No pleural effusion. No pneumothorax. No mass or focal consolidation. No suspicious pulmonary nodules. Musculoskeletal: No aggressive osseous lesions. Upper abdomen: Left-sided hydronephrosis, more thoroughly evaluated on same day CT abdomen pelvis. Review of the MIP images confirms the above findings. IMPRESSION: 1. No findings of pulmonary embolism. No acute findings in the chest. 2. Partially visualized left hydronephrosis. See separate report for same-day CT abdomen and pelvis for complete evaluation. Electronically Signed   By: Olive Bass M.D.   On: 06/10/2023 14:10   DG Chest 2 View  Result Date: 06/10/2023 CLINICAL DATA:  Chest wall pain EXAM: CHEST - 2 VIEW COMPARISON:  01/06/2023 FINDINGS: The heart size and mediastinal contours are within normal limits. Both lungs are clear. The visualized skeletal structures are unremarkable. IMPRESSION: No active cardiopulmonary disease. Electronically Signed   By: Signa Kell M.D.   On: 06/10/2023 14:05   CT ABDOMEN PELVIS W CONTRAST  Result Date: 06/10/2023 CLINICAL DATA:  Lower abdominal pain. Nausea and vomiting for 2 days EXAM: CT ABDOMEN AND PELVIS WITH CONTRAST TECHNIQUE: Multidetector CT imaging of the abdomen and pelvis  was performed using the standard protocol following bolus administration of intravenous contrast. RADIATION DOSE REDUCTION: This exam was performed according to the departmental dose-optimization program which includes automated exposure control, adjustment of the mA and/or kV according to patient size and/or use of iterative reconstruction technique. CONTRAST:  OMNIPAQUE IOHEXOL 350 MG/ML SOLN COMPARISON:  CT 01/05/2023 FINDINGS: Lower chest: Lung bases are clear.  No pleural effusion Hepatobiliary: Smaller area of focal fat deposition seen in the liver adjacent to the falciform ligament in segment 4. No other space-occupying liver lesion. Patent portal vein. Gallbladder is nondilated. Pancreas: Unremarkable. No pancreatic ductal dilatation or surrounding inflammatory changes. Spleen: Normal in size without focal abnormality. Adrenals/Urinary Tract: The adrenal glands are preserved right kidney has a subtle area of heterogeneous enhancement peripherally in the midportion which is new from previous examination. On coronal series 6, image 54 this measures 10 mm. Second focus superior anterior right kidney on axial series 2, image 37 measuring 10 mm. There is focus similarly on the left kidney measuring 10 mm on series 6, image 53. These are not clearly cysts. Recommend further delineation bilateral extrarenal pelvis noted bilaterally. There is dilatation of the left ureter down to the bladder. Minimal on the right. No ureteral stones. Preserved contours of the urinary bladder Stomach/Bowel: No oral contrast. Large bowel is of normal course and caliber with scattered stool. Surgical changes identified from partial right hemicolectomy. Stomach and small bowel is nondilated. There are a few loops of small bowel with debris and fluid in the midabdomen and distention of 22.8 cm but no clear obstruction or abnormal dilatation Vascular/Lymphatic: Normal caliber aorta and IVC. No specific abnormal lymph node  enlargement identified. Variant left-sided renal venous anatomy. Reproductive: Slightly heterogeneous myometrium of the uterus, possible small fibroids. No separate adnexal mass Other: No free air or free fluid Musculoskeletal: No acute or significant osseous findings. IMPRESSION: Surgical changes from partial right hemicolectomy and primary anastomosis. There are a few distended fluid-filled loops of small bowel in the midabdomen no obstruction or free air. Dilated renal collecting systems bilaterally without delayed enhancement or excretion. No stones. However there are some subtle ill-defined areas of poor enhancement along the kidneys. These has a differential. Please correlate for any clinical evidence of infectious process. Lesions are also in the differential as these were not seen on the study of March 2024. Recommend further evaluation when  appropriate such as with and without contrast examinations with either CT or MRI Electronically Signed   By: Karen Kays M.D.   On: 06/10/2023 14:04    Procedures Procedures    Medications Ordered in ED Medications  potassium chloride SA (KLOR-CON M) CR tablet 40 mEq (40 mEq Oral Given 06/10/23 1306)  potassium chloride 10 mEq in 100 mL IVPB (0 mEq Intravenous Stopped 06/10/23 1558)  sodium chloride 0.9 % bolus 1,000 mL (0 mLs Intravenous Stopped 06/10/23 1558)  HYDROmorphone (DILAUDID) injection 0.5 mg (0.5 mg Intravenous Given 06/10/23 1339)  iohexol (OMNIPAQUE) 350 MG/ML injection 100 mL (100 mLs Intravenous Contrast Given 06/10/23 1343)  potassium chloride (KLOR-CON) packet 60 mEq (60 mEq Oral Given 06/10/23 1724)  cefTRIAXone (ROCEPHIN) 1 g in sodium chloride 0.9 % 100 mL IVPB (0 g Intravenous Stopped 06/10/23 1930)  acetaminophen (TYLENOL) tablet 1,000 mg (1,000 mg Oral Given 06/10/23 1905)  HYDROmorphone (DILAUDID) injection 0.5 mg (0.5 mg Intravenous Given 06/10/23 1905)    ED Course/ Medical Decision Making/ A&P Clinical Course as of 06/10/23 2201   Mon Jun 10, 2023  1250 Potassium(!!): 2.6 [SB]  1315 D-Dimer, Quant(!): 0.86 [SB]  1849 Discussed with patient lab and imaging findings as well as discharge treatment plan.  Answered all verbal questions.  Patient appears safe for discharge at this time. [SB]  1953 Tolerating PO intake at discharge [SB]    Clinical Course User Index [SB] ,  A, PA-C                                 Medical Decision Making Amount and/or Complexity of Data Reviewed Labs: ordered. Decision-making details documented in ED Course. Radiology: ordered.  Risk OTC drugs. Prescription drug management.   Pt presents with concerns for nausea, vomiting, diarrhea onset 2 days.  No sick contacts. Vital signs, patient afebrile, not tachycardic or hypoxic. On exam, pt with No spinal tenderness to palpation.  Tenderness to palpation noted to left upper trapezius and bilateral lumbar musculature.  No overlying skin changes noted.  Patient able to move all extremities x 4 without assistance or difficulty. Mild lower abdominal TTP. No acute cardiovascular, respiratory, abdominal exam findings. Differential diagnosis includes COVID, flu, RSV, pneumonia, PE, PTX, ACS, electrolyte abnormality, acute cystitis, intra-abdominal abnormality.    Co morbidities that complicate the patient evaluation: DM  Labs:  I ordered, and personally interpreted labs.  The pertinent results include:   Negative RSV, COVID, Flu Initial troponin at 2, delta troponin at <2 Dimer slightly elevated 0.86 Magnesium unremarkable Urinalysis with small amount of hemoglobin, large leukocytes otherwise unremarkable CMP with hypokalemia 2.6 otherwise unremarkable Negative hCG Lipase unremarkable CBC with leukocytosis at 12.8 otherwise unremarkable  Imaging: I ordered imaging studies including chest x-ray, CT abdomen pelvis, CTA chest I independently visualized and interpreted imaging which showed: Chest x-ray unremarkable CT abdomen  pelvis with Surgical changes from partial right hemicolectomy and primary  anastomosis. There are a few distended fluid-filled loops of small  bowel in the midabdomen no obstruction or free air.    Dilated renal collecting systems bilaterally without delayed  enhancement or excretion. No stones. However there are some subtle  ill-defined areas of poor enhancement along the kidneys. These has a  differential. Please correlate for any clinical evidence of  infectious process. Lesions are also in the differential as these  were not seen on the study of March 2024. Recommend further  evaluation when  appropriate such as with and without contrast  examinations with either CT or MRI   CTA chest with  1. No findings of pulmonary embolism. No acute findings in the  chest.  2. Partially visualized left hydronephrosis. See separate report for  same-day CT abdomen and pelvis for complete evaluation.   I agree with the radiologist interpretation  Medications:  I ordered medication including Dilaudid, Tylenol, potassium, IV fluids, Rocephin for symptom management, repletion, antibiotic treatment Reevaluation of the patient after these medicines and interventions, I reevaluated the patient and found that they have improved I have reviewed the patients home medicines and have made adjustments as needed  Disposition: Presentation suspicious for hypokalemia, viral GI etiology.  Also notable for acute cystitis.  Doubt concerns at this time for pyelonephritis or nephrolithiasis is.  Doubt concerns at this time for COVID, flu, RSV.  Doubt concerns this time for pneumonia.  All concerns this time for ACS.  Case discussed with attending who evaluated patient and agrees with discharge treatment plan.  After consideration of the diagnostic results and the patients response to treatment, I feel that the patient would benefit from Discharge home.  Prescription for Zofran, potassium, Keflex provided today.  Patient  instructed importance of following up with her primary care provider in 5 days - 1 week for potassium recheck.  Supportive care measures and strict return precautions discussed with patient at bedside. Pt acknowledges and verbalizes understanding. Pt appears safe for discharge. Follow up as indicated in discharge paperwork.    This chart was dictated using voice recognition software, Dragon. Despite the best efforts of this provider to proofread and correct errors, errors may still occur which can change documentation meaning.   Final Clinical Impression(s) / ED Diagnoses Final diagnoses:  Hypokalemia  Nausea vomiting and diarrhea  Acute cystitis with hematuria    Rx / DC Orders ED Discharge Orders          Ordered    ondansetron (ZOFRAN) 4 MG tablet  Every 8 hours PRN        06/10/23 1949    potassium chloride SA (KLOR-CON M) 20 MEQ tablet  2 times daily        06/10/23 1949    cephALEXin (KEFLEX) 500 MG capsule  2 times daily        06/10/23 1949              ,  A, PA-C 06/10/23 2201    Rondel Baton, MD 06/11/23 1759

## 2023-06-10 NOTE — ED Triage Notes (Signed)
BIB by EMS, N/V/D x 2 days. On and off fever as well. 650 mg Tylenol given by EMS, #18 R A/C 500 ml NS and 4 mg Zofran. 123/78-104-CBG 89 12 Ld unremarkable

## 2023-06-10 NOTE — ED Notes (Signed)
Pt was informed that a urine sample was needed. Pt stated that she did not need to go to the bathroom at this time. Labeled cup was left at bedside.

## 2023-06-10 NOTE — Discharge Instructions (Addendum)
It was a pleasure taking care of you today!   Your low potassium was treated in the ED. You will be sent home with a prescription for potassium. Take as prescribed. Attached is information for potassium rich foods. Follow up with your primary care provider in 1 week for potassium recheck.   Your lab and imaging studies did not show any concerning emergent findings at this time. On the CT of your abdomen, there were concerns for changes with your kidney, this can be managed with your primary care provider with additional imaging studies. You will be sent a prescription for keflex to treat for concerns for an UTI. Your urine has been sent for culture as well. You will be sent a prescription for Zofran, take as directed if you are experiencing nausea/vomiting.  If you have to take Zofran, wait at least 30 minutes before you attempt small sips/small bites of food/fluid.  Ensure to maintain fluid intake with water, tea, broth, soup, Pedialyte, Gatorade.  Follow-up with your primary care provider as needed regarding this ED visit.  Attached is information for the on-call GI specialist, you may call and set up a follow-up appointment regarding today's ED visit.  Return to the emergency department if you experience increasing/worsening symptoms.

## 2023-06-14 ENCOUNTER — Telehealth (HOSPITAL_BASED_OUTPATIENT_CLINIC_OR_DEPARTMENT_OTHER): Payer: Self-pay | Admitting: *Deleted

## 2023-06-14 NOTE — Telephone Encounter (Signed)
Post ED Visit - Positive Culture Follow-up  Culture report reviewed by antimicrobial stewardship pharmacist: [facility] []  Enzo Bi, Pharm.D. []  Celedonio Miyamoto, Pharm.D., BCPS AQ-ID []  Garvin Fila, Pharm.D., BCPS []  Georgina Pillion, Pharm.D., BCPS []  [place], [address].D., BCPS, AAHIVP []  Estella Husk, Pharm.D., BCPS, AAHIVP []  Lysle Pearl, PharmD, BCPS []  Phillips Climes, PharmD, BCPS []  Agapito Games, PharmD, BCPS []  Verlan Friends, PharmD []  Mervyn Gay, PharmD, BCPS []  Vinnie Level, PharmD  [facility] [x]  Nicole Kindred PharmD []  Greer Pickerel, PharmD []  Adalberto Cole, PharmD []  Perlie Gold, Rph []  Lonell Face) Jean Rosenthal, PharmD []  Earl Many, PharmD []  Junita Push, PharmD []  Dorna Leitz, PharmD []  Terrilee Files, PharmD []  Lynann Beaver, PharmD []  Keturah Barre, PharmD []  Loralee Pacas, PharmD []  Bernadene Person, PharmD   Positive urine culture Treated with Cephalexin, organism sensitive to the same and no further patient follow-up is required at this time.  Bing Quarry 06/14/2023, 11:33 AM

## 2024-07-16 ENCOUNTER — Ambulatory Visit: Admitting: Obstetrics and Gynecology

## 2024-09-01 ENCOUNTER — Ambulatory Visit: Admitting: Obstetrics and Gynecology
